# Patient Record
Sex: Female | Born: 1945 | Race: White | Hispanic: No | Marital: Married | Smoking: Former smoker
Health system: Southern US, Community
[De-identification: ages and names within clinical notes are randomized; demographics above are authoritative.]

## PROBLEM LIST (undated history)

## (undated) DIAGNOSIS — E785 Hyperlipidemia, unspecified: Secondary | ICD-10-CM

## (undated) DIAGNOSIS — I350 Nonrheumatic aortic (valve) stenosis: Secondary | ICD-10-CM

## (undated) DIAGNOSIS — R112 Nausea with vomiting, unspecified: Secondary | ICD-10-CM

## (undated) DIAGNOSIS — I10 Essential (primary) hypertension: Secondary | ICD-10-CM

## (undated) DIAGNOSIS — I2510 Atherosclerotic heart disease of native coronary artery without angina pectoris: Secondary | ICD-10-CM

## (undated) DIAGNOSIS — D649 Anemia, unspecified: Secondary | ICD-10-CM

## (undated) DIAGNOSIS — I351 Nonrheumatic aortic (valve) insufficiency: Secondary | ICD-10-CM

## (undated) DIAGNOSIS — M810 Age-related osteoporosis without current pathological fracture: Secondary | ICD-10-CM

## (undated) DIAGNOSIS — T7840XA Allergy, unspecified, initial encounter: Secondary | ICD-10-CM

## (undated) DIAGNOSIS — Z9889 Other specified postprocedural states: Secondary | ICD-10-CM

## (undated) DIAGNOSIS — M8580 Other specified disorders of bone density and structure, unspecified site: Secondary | ICD-10-CM

## (undated) DIAGNOSIS — D689 Coagulation defect, unspecified: Secondary | ICD-10-CM

## (undated) DIAGNOSIS — R011 Cardiac murmur, unspecified: Secondary | ICD-10-CM

## (undated) DIAGNOSIS — Z8679 Personal history of other diseases of the circulatory system: Secondary | ICD-10-CM

## (undated) DIAGNOSIS — T8859XA Other complications of anesthesia, initial encounter: Secondary | ICD-10-CM

## (undated) DIAGNOSIS — I5189 Other ill-defined heart diseases: Secondary | ICD-10-CM

## (undated) DIAGNOSIS — Z5189 Encounter for other specified aftercare: Secondary | ICD-10-CM

## (undated) HISTORY — DX: Hyperlipidemia, unspecified: E78.5

## (undated) HISTORY — DX: Age-related osteoporosis without current pathological fracture: M81.0

## (undated) HISTORY — PX: POLYPECTOMY: SHX149

## (undated) HISTORY — PX: TONSILLECTOMY: SUR1361

## (undated) HISTORY — DX: Atherosclerotic heart disease of native coronary artery without angina pectoris: I25.10

## (undated) HISTORY — DX: Other specified postprocedural states: Z98.890

## (undated) HISTORY — PX: BACK SURGERY: SHX140

## (undated) HISTORY — PX: KNEE ARTHROSCOPY: SUR90

## (undated) HISTORY — DX: Personal history of other diseases of the circulatory system: Z86.79

## (undated) HISTORY — DX: Allergy, unspecified, initial encounter: T78.40XA

## (undated) HISTORY — DX: Other ill-defined heart diseases: I51.89

## (undated) HISTORY — PX: TUBAL LIGATION: SHX77

## (undated) HISTORY — PX: TONSILLECTOMY: SHX5217

## (undated) HISTORY — DX: Nonrheumatic aortic (valve) stenosis: I35.0

## (undated) HISTORY — DX: Cardiac murmur, unspecified: R01.1

## (undated) HISTORY — DX: Coagulation defect, unspecified: D68.9

## (undated) HISTORY — DX: Essential (primary) hypertension: I10

## (undated) HISTORY — DX: Other specified disorders of bone density and structure, unspecified site: M85.80

## (undated) HISTORY — DX: Encounter for other specified aftercare: Z51.89

## (undated) HISTORY — DX: Anemia, unspecified: D64.9

---

## 1992-04-12 HISTORY — PX: VAGINAL HYSTERECTOMY: SUR661

## 2003-02-11 HISTORY — PX: CHOLECYSTECTOMY: SHX55

## 2007-07-29 ENCOUNTER — Encounter: Payer: Self-pay | Admitting: Family Medicine

## 2007-08-16 ENCOUNTER — Ambulatory Visit: Payer: Self-pay | Admitting: Family Medicine

## 2007-08-16 DIAGNOSIS — Z8679 Personal history of other diseases of the circulatory system: Secondary | ICD-10-CM | POA: Insufficient documentation

## 2007-08-16 DIAGNOSIS — M25569 Pain in unspecified knee: Secondary | ICD-10-CM | POA: Insufficient documentation

## 2007-08-16 DIAGNOSIS — J309 Allergic rhinitis, unspecified: Secondary | ICD-10-CM | POA: Insufficient documentation

## 2007-08-28 ENCOUNTER — Telehealth: Payer: Self-pay | Admitting: Family Medicine

## 2007-08-29 ENCOUNTER — Ambulatory Visit: Payer: Self-pay | Admitting: Family Medicine

## 2007-08-29 DIAGNOSIS — J019 Acute sinusitis, unspecified: Secondary | ICD-10-CM | POA: Insufficient documentation

## 2007-09-06 ENCOUNTER — Telehealth (INDEPENDENT_AMBULATORY_CARE_PROVIDER_SITE_OTHER): Payer: Self-pay | Admitting: *Deleted

## 2007-09-06 ENCOUNTER — Ambulatory Visit: Payer: Self-pay | Admitting: Cardiology

## 2007-09-18 ENCOUNTER — Ambulatory Visit: Payer: Self-pay

## 2007-09-18 ENCOUNTER — Encounter: Payer: Self-pay | Admitting: Cardiology

## 2007-11-08 ENCOUNTER — Ambulatory Visit: Payer: Self-pay | Admitting: Family Medicine

## 2007-11-09 LAB — CONVERTED CEMR LAB
ALT: 21 units/L (ref 0–35)
AST: 26 units/L (ref 0–37)
Albumin: 4.2 g/dL (ref 3.5–5.2)
Alkaline Phosphatase: 119 units/L — ABNORMAL HIGH (ref 39–117)
BUN: 10 mg/dL (ref 6–23)
Basophils Absolute: 0 10*3/uL (ref 0.0–0.1)
Basophils Relative: 0.4 % (ref 0.0–3.0)
Bilirubin, Direct: 0.1 mg/dL (ref 0.0–0.3)
CO2: 32 meq/L (ref 19–32)
Calcium: 9.8 mg/dL (ref 8.4–10.5)
Chloride: 105 meq/L (ref 96–112)
Cholesterol: 199 mg/dL (ref 0–200)
Creatinine, Ser: 0.7 mg/dL (ref 0.4–1.2)
Eosinophils Absolute: 0.2 10*3/uL (ref 0.0–0.7)
Eosinophils Relative: 3.3 % (ref 0.0–5.0)
GFR calc Af Amer: 109 mL/min
GFR calc non Af Amer: 90 mL/min
Glucose, Bld: 88 mg/dL (ref 70–99)
HCT: 41.1 % (ref 36.0–46.0)
HDL: 59.8 mg/dL (ref >39.0)
Hemoglobin: 14.2 g/dL (ref 12.0–15.0)
LDL Cholesterol: 122 mg/dL — ABNORMAL HIGH (ref 0–99)
Lymphocytes Relative: 29.4 % (ref 12.0–46.0)
MCHC: 34.6 g/dL (ref 30.0–36.0)
MCV: 82.2 fL (ref 78.0–100.0)
Monocytes Absolute: 0.5 10*3/uL (ref 0.1–1.0)
Monocytes Relative: 7.3 % (ref 3.0–12.0)
Neutro Abs: 3.8 10*3/uL (ref 1.4–7.7)
Neutrophils Relative %: 59.6 % (ref 43.0–77.0)
Platelets: 261 10*3/uL (ref 150–400)
Potassium: 4.8 meq/L (ref 3.5–5.1)
RBC: 5.01 M/uL (ref 3.87–5.11)
RDW: 13.2 % (ref 11.5–14.6)
Sodium: 145 meq/L (ref 135–145)
TSH: 1.69 microintl units/mL (ref 0.35–5.50)
Total Bilirubin: 1 mg/dL (ref 0.3–1.2)
Total CHOL/HDL Ratio: 3.3
Total Protein: 7.3 g/dL (ref 6.0–8.3)
Triglycerides: 87 mg/dL (ref 0–149)
VLDL: 17 mg/dL (ref 0–40)
WBC: 6.4 10*3/uL (ref 4.5–10.5)

## 2008-01-19 ENCOUNTER — Encounter: Payer: Self-pay | Admitting: Family Medicine

## 2008-03-04 ENCOUNTER — Ambulatory Visit: Payer: Self-pay | Admitting: Family Medicine

## 2008-07-15 ENCOUNTER — Telehealth: Payer: Self-pay | Admitting: Family Medicine

## 2008-07-31 ENCOUNTER — Telehealth (INDEPENDENT_AMBULATORY_CARE_PROVIDER_SITE_OTHER): Payer: Self-pay | Admitting: *Deleted

## 2008-09-20 ENCOUNTER — Telehealth: Payer: Self-pay | Admitting: Family Medicine

## 2008-09-24 ENCOUNTER — Encounter: Payer: Self-pay | Admitting: Family Medicine

## 2008-10-08 ENCOUNTER — Encounter: Admission: RE | Admit: 2008-10-08 | Discharge: 2008-10-08 | Payer: Self-pay | Admitting: Obstetrics and Gynecology

## 2008-11-01 ENCOUNTER — Telehealth: Payer: Self-pay | Admitting: Family Medicine

## 2008-12-10 ENCOUNTER — Ambulatory Visit: Payer: Self-pay | Admitting: Family Medicine

## 2008-12-10 LAB — CONVERTED CEMR LAB
Bilirubin Urine: NEGATIVE
Glucose, Urine, Semiquant: NEGATIVE
Ketones, urine, test strip: NEGATIVE
Nitrite: NEGATIVE
Protein, U semiquant: NEGATIVE
Specific Gravity, Urine: >=1.03
Urobilinogen, UA: 0.2
WBC Urine, dipstick: NEGATIVE
pH: 5

## 2008-12-17 ENCOUNTER — Ambulatory Visit: Payer: Self-pay | Admitting: Family Medicine

## 2008-12-17 DIAGNOSIS — M949 Disorder of cartilage, unspecified: Secondary | ICD-10-CM

## 2008-12-17 DIAGNOSIS — M899 Disorder of bone, unspecified: Secondary | ICD-10-CM | POA: Insufficient documentation

## 2008-12-17 LAB — CONVERTED CEMR LAB
ALT: 25 units/L (ref 0–35)
AST: 29 units/L (ref 0–37)
Albumin: 4.1 g/dL (ref 3.5–5.2)
Alkaline Phosphatase: 117 units/L (ref 39–117)
BUN: 12 mg/dL (ref 6–23)
Basophils Absolute: 0 10*3/uL (ref 0.0–0.1)
Basophils Relative: 0 % (ref 0.0–3.0)
Bilirubin, Direct: 0.1 mg/dL (ref 0.0–0.3)
CO2: 28 meq/L (ref 19–32)
Calcium: 9.3 mg/dL (ref 8.4–10.5)
Chloride: 105 meq/L (ref 96–112)
Cholesterol: 192 mg/dL (ref 0–200)
Creatinine, Ser: 0.7 mg/dL (ref 0.4–1.2)
Eosinophils Absolute: 0.2 10*3/uL (ref 0.0–0.7)
Eosinophils Relative: 3.4 % (ref 0.0–5.0)
GFR calc non Af Amer: 89.6 mL/min (ref >60)
Glucose, Bld: 80 mg/dL (ref 70–99)
HCT: 40.8 % (ref 36.0–46.0)
HDL: 56.2 mg/dL (ref >39.00)
Hemoglobin: 14.1 g/dL (ref 12.0–15.0)
LDL Cholesterol: 121 mg/dL — ABNORMAL HIGH (ref 0–99)
Lymphocytes Relative: 24.3 % (ref 12.0–46.0)
Lymphs Abs: 1.6 10*3/uL (ref 0.7–4.0)
MCHC: 34.7 g/dL (ref 30.0–36.0)
MCV: 80.3 fL (ref 78.0–100.0)
Monocytes Absolute: 0.4 10*3/uL (ref 0.1–1.0)
Monocytes Relative: 6.8 % (ref 3.0–12.0)
Neutro Abs: 4.2 10*3/uL (ref 1.4–7.7)
Neutrophils Relative %: 65.5 % (ref 43.0–77.0)
Platelets: 221 10*3/uL (ref 150.0–400.0)
Potassium: 4 meq/L (ref 3.5–5.1)
RBC: 5.08 M/uL (ref 3.87–5.11)
RDW: 13.2 % (ref 11.5–14.6)
Sodium: 140 meq/L (ref 135–145)
TSH: 1.41 microintl units/mL (ref 0.35–5.50)
Total Bilirubin: 1.1 mg/dL (ref 0.3–1.2)
Total CHOL/HDL Ratio: 3
Total Protein: 7 g/dL (ref 6.0–8.3)
Triglycerides: 73 mg/dL (ref 0.0–149.0)
VLDL: 14.6 mg/dL (ref 0.0–40.0)
WBC: 6.4 10*3/uL (ref 4.5–10.5)

## 2008-12-24 ENCOUNTER — Encounter: Payer: Self-pay | Admitting: Family Medicine

## 2009-01-28 ENCOUNTER — Ambulatory Visit: Payer: Self-pay | Admitting: Family Medicine

## 2009-01-28 DIAGNOSIS — L259 Unspecified contact dermatitis, unspecified cause: Secondary | ICD-10-CM | POA: Insufficient documentation

## 2009-04-17 ENCOUNTER — Telehealth: Payer: Self-pay | Admitting: Family Medicine

## 2009-08-10 HISTORY — PX: ANGIOPLASTY: SHX39

## 2009-08-22 ENCOUNTER — Ambulatory Visit: Payer: Self-pay | Admitting: Family Medicine

## 2009-08-22 DIAGNOSIS — R079 Chest pain, unspecified: Secondary | ICD-10-CM | POA: Insufficient documentation

## 2009-08-22 DIAGNOSIS — M79609 Pain in unspecified limb: Secondary | ICD-10-CM | POA: Insufficient documentation

## 2009-08-26 ENCOUNTER — Encounter: Payer: Self-pay | Admitting: Family Medicine

## 2009-08-28 ENCOUNTER — Telehealth (INDEPENDENT_AMBULATORY_CARE_PROVIDER_SITE_OTHER): Payer: Self-pay | Admitting: *Deleted

## 2009-09-01 ENCOUNTER — Ambulatory Visit: Payer: Self-pay | Admitting: Cardiology

## 2009-09-01 ENCOUNTER — Inpatient Hospital Stay (HOSPITAL_COMMUNITY): Admission: AD | Admit: 2009-09-01 | Discharge: 2009-09-03 | Payer: Self-pay | Admitting: Cardiology

## 2009-09-01 ENCOUNTER — Encounter: Payer: Self-pay | Admitting: Cardiology

## 2009-09-01 ENCOUNTER — Ambulatory Visit: Payer: Self-pay

## 2009-09-01 ENCOUNTER — Encounter (HOSPITAL_COMMUNITY): Admission: RE | Admit: 2009-09-01 | Discharge: 2009-11-12 | Payer: Self-pay | Admitting: Family Medicine

## 2009-09-02 ENCOUNTER — Encounter: Payer: Self-pay | Admitting: Cardiology

## 2009-09-03 ENCOUNTER — Encounter: Payer: Self-pay | Admitting: Cardiology

## 2009-09-03 ENCOUNTER — Telehealth: Payer: Self-pay | Admitting: Cardiology

## 2009-09-09 ENCOUNTER — Telehealth: Payer: Self-pay | Admitting: Family Medicine

## 2009-09-15 ENCOUNTER — Ambulatory Visit: Payer: Self-pay | Admitting: Internal Medicine

## 2009-09-15 ENCOUNTER — Encounter: Payer: Self-pay | Admitting: Physician Assistant

## 2009-09-15 DIAGNOSIS — I2510 Atherosclerotic heart disease of native coronary artery without angina pectoris: Secondary | ICD-10-CM | POA: Insufficient documentation

## 2009-09-15 DIAGNOSIS — R0989 Other specified symptoms and signs involving the circulatory and respiratory systems: Secondary | ICD-10-CM | POA: Insufficient documentation

## 2009-09-15 DIAGNOSIS — R072 Precordial pain: Secondary | ICD-10-CM | POA: Insufficient documentation

## 2009-09-18 ENCOUNTER — Encounter: Payer: Self-pay | Admitting: Cardiology

## 2009-09-18 ENCOUNTER — Encounter (HOSPITAL_COMMUNITY): Admission: RE | Admit: 2009-09-18 | Discharge: 2009-12-17 | Payer: Self-pay | Admitting: Cardiology

## 2009-09-24 ENCOUNTER — Encounter: Payer: Self-pay | Admitting: Cardiology

## 2009-09-25 ENCOUNTER — Telehealth: Payer: Self-pay | Admitting: Cardiology

## 2009-09-30 ENCOUNTER — Telehealth: Payer: Self-pay | Admitting: Cardiology

## 2009-10-09 ENCOUNTER — Encounter: Admission: RE | Admit: 2009-10-09 | Discharge: 2009-10-09 | Payer: Self-pay | Admitting: Obstetrics and Gynecology

## 2009-10-10 ENCOUNTER — Encounter: Payer: Self-pay | Admitting: Cardiology

## 2009-10-28 ENCOUNTER — Ambulatory Visit: Payer: Self-pay

## 2009-10-28 ENCOUNTER — Ambulatory Visit: Payer: Self-pay | Admitting: Cardiology

## 2009-10-29 LAB — CONVERTED CEMR LAB
ALT: 20 units/L (ref 0–35)
AST: 28 units/L (ref 0–37)
Albumin: 4.4 g/dL (ref 3.5–5.2)
Alkaline Phosphatase: 113 units/L (ref 39–117)
Bilirubin, Direct: 0.2 mg/dL (ref 0.0–0.3)
Cholesterol: 147 mg/dL (ref 0–200)
HDL: 55.8 mg/dL (ref >39.00)
LDL Cholesterol: 65 mg/dL (ref 0–99)
Total Bilirubin: 0.6 mg/dL (ref 0.3–1.2)
Total CHOL/HDL Ratio: 3
Total Protein: 7.3 g/dL (ref 6.0–8.3)
Triglycerides: 130 mg/dL (ref 0.0–149.0)
VLDL: 26 mg/dL (ref 0.0–40.0)

## 2009-11-07 ENCOUNTER — Ambulatory Visit: Payer: Self-pay | Admitting: Cardiology

## 2009-11-07 ENCOUNTER — Encounter (INDEPENDENT_AMBULATORY_CARE_PROVIDER_SITE_OTHER): Payer: Self-pay | Admitting: *Deleted

## 2009-11-07 DIAGNOSIS — E785 Hyperlipidemia, unspecified: Secondary | ICD-10-CM | POA: Insufficient documentation

## 2009-12-18 ENCOUNTER — Encounter (HOSPITAL_COMMUNITY): Admission: RE | Admit: 2009-12-18 | Discharge: 2010-01-09 | Payer: Self-pay | Admitting: Cardiology

## 2009-12-26 ENCOUNTER — Encounter: Payer: Self-pay | Admitting: Cardiology

## 2010-01-10 ENCOUNTER — Encounter (HOSPITAL_COMMUNITY): Admission: RE | Admit: 2010-01-10 | Discharge: 2010-03-11 | Payer: Self-pay | Admitting: Cardiology

## 2010-01-21 ENCOUNTER — Encounter: Payer: Self-pay | Admitting: Family Medicine

## 2010-02-06 ENCOUNTER — Encounter: Payer: Self-pay | Admitting: Cardiology

## 2010-03-17 ENCOUNTER — Ambulatory Visit: Payer: Self-pay | Admitting: Cardiology

## 2010-03-17 ENCOUNTER — Encounter: Payer: Self-pay | Admitting: Cardiology

## 2010-03-18 ENCOUNTER — Encounter (INDEPENDENT_AMBULATORY_CARE_PROVIDER_SITE_OTHER): Payer: Self-pay | Admitting: *Deleted

## 2010-03-18 LAB — CONVERTED CEMR LAB
ALT: 34 units/L (ref 0–35)
AST: 38 units/L — ABNORMAL HIGH (ref 0–37)
Albumin: 4.1 g/dL (ref 3.5–5.2)
Alkaline Phosphatase: 116 units/L (ref 39–117)
Bilirubin, Direct: 0.1 mg/dL (ref 0.0–0.3)
Cholesterol: 139 mg/dL (ref 0–200)
HDL: 54.7 mg/dL (ref >39.00)
LDL Cholesterol: 57 mg/dL (ref 0–99)
Total Bilirubin: 0.5 mg/dL (ref 0.3–1.2)
Total CHOL/HDL Ratio: 3
Total Protein: 6.8 g/dL (ref 6.0–8.3)
Triglycerides: 138 mg/dL (ref 0.0–149.0)
VLDL: 27.6 mg/dL (ref 0.0–40.0)

## 2010-05-12 NOTE — Miscellaneous (Signed)
Summary: flu inj  givne at [facility].   Clinical Lists Changes  Observations: Added new observation of FLU VAX: Historical (01/21/2010 16:07)      Immunization History:  Influenza Immunization History:    Influenza:  historical (01/21/2010) pt received [facility] inj at [facility] lot number [id number]....gh rn..............Marland Kitchen

## 2010-05-12 NOTE — Progress Notes (Signed)
Summary: returning call  Phone Note Call from Patient Call back at Home Phone [phone]   Caller: Patient Reason for Call: Talk to Nurse Summary of Call: returning call Initial call taken by: Migdalia Dk,  September 30, 2009 2:35 PM  Follow-up for Phone Call        Rx Called PT AWARE  OF MED CHANGE . Follow-up by: Scherrie Bateman, LPN,  September 30, 2009 2:47 PM    New/Updated Medications: METOPROLOL SUCCINATE 25 MG XR24H-TAB (METOPROLOL SUCCINATE) 1 once daily Prescriptions: METOPROLOL SUCCINATE 25 MG XR24H-TAB (METOPROLOL SUCCINATE) 1 once daily  #90 x 3   Entered by:   Scherrie Bateman, LPN   Authorized by:   Ferman Hamming, MD, [facility]   Signed by:   Scherrie Bateman, LPN on 46/96/2952   Method used:   [facility]        [facility]  [place] [address] [facility]       [address]       [place], [place]  [address]       Ph: [phone] or [phone]       Fax: [phone]   RxID:   [id number]

## 2010-05-12 NOTE — Letter (Signed)
[facility]   Imported By: Marylou Mccoy 10/27/2009 16:38:17  _____________________________________________________________________  External Attachment:    Type:   Image     Comment:   External Document

## 2010-05-12 NOTE — Assessment & Plan Note (Signed)
Summary: SINUS INFECTION/OK PER DR FRY/CCM   Vital Signs:  Patient Profile:   65 Years Old Female Height:     64.5 inches Weight:      177 pounds Temp:     98.1 degrees F oral Pulse rate:   83 / minute Pulse rhythm:   regular BP sitting:   112 / 68  (left arm) Cuff size:   regular  Vitals Entered By: Alfred Levins, CMA (Aug 29, 2007 11:18 AM)                 Chief Complaint:  h/a, congestion, and nausea.  History of Present Illness: 4 days of sinus pressure, HA, PND and teeth hurting. No fever or ST or cough. On Sudafed.     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 No apparent alopecia or balding. Eyes:     No corneal or conjunctival inflammation noted. EOMI. Perrla. Funduscopic exam benign, without hemorrhages, exudates or papilledema. Vision grossly normal. Ears:     External ear exam shows no significant lesions or deformities.  Otoscopic examination reveals clear canals, tympanic membranes are intact bilaterally without bulging, retraction, inflammation or discharge. Hearing is grossly normal bilaterally. Nose:     External nasal examination shows no deformity or inflammation. Nasal mucosa are pink and moist without lesions or exudates. Mouth:     Oral mucosa and oropharynx without lesions or exudates.  Teeth in good repair. Neck:     No deformities, masses, or tenderness noted. Lungs:     Normal  respiratory effort, chest expands symmetrically. Lungs are clear to auscultation, no crackles or wheezes.    Impression & Recommendations:  Problem # 1:  ACUTE SINUSITIS, UNSPECIFIED (ICD-461.9)  Her updated medication list for this problem includes:    Flonase 50 Mcg/act Susp (Fluticasone propionate) .Marland Kitchen... 2 sprays each nostril once daily    Zithromax Z-pak 250 Mg Tabs (Azithromycin) .Marland Kitchen... As directed  Orders: Depo- Medrol 80mg  (J1040) Admin of Therapeutic Inj  intramuscular or subcutaneous (29528)   Problem # 2:  ALLERGIC RHINITIS (ICD-477.9)  Her updated medication list for this problem includes:    Allegra 180 Mg Tabs (Fexofenadine hcl) .Marland Kitchen... 1 by mouth once daily as needed    Flonase 50 Mcg/act Susp (Fluticasone propionate) .Marland Kitchen... 2 sprays each nostril once daily   Complete Medication List: 1)  Allegra 180 Mg Tabs (Fexofenadine hcl) .Marland Kitchen.. 1 by mouth once daily as needed 2)  Calcium 600 1500 Mg Tabs (Calcium carbonate) .Marland Kitchen.. 1 by mouth once daily 3)  Flonase 50 Mcg/act Susp (Fluticasone propionate) .... 2 sprays each nostril once daily 4)  Zithromax Z-pak 250 Mg Tabs (Azithromycin) .... As directed   Patient Instructions: 1)  Please schedule a follow-up  appointment as needed.   Prescriptions: ZITHROMAX Z-PAK 250 MG  TABS (AZITHROMYCIN) as directed  #1 x 0   Entered and Authorized by:   Nelwyn Salisbury MD   Signed by:   Alfred Levins, CMA on 08/29/2007   Method used:   [facility] ...       [facility]  [place] [address]       [address]       [place], [place]  [address]       Ph: [phone] or [phone]       Fax: [phone]   RxID:   [id number] FLONASE 50 MCG/ACT  SUSP (FLUTICASONE PROPIONATE) 2 sprays each nostril once daily  #30 days x 11   Entered and Authorized by:   Nelwyn Salisbury MD   Signed by:   Alfred Levins, CMA on 08/29/2007   Method used:   Electronically [facility] to ...       [facility]  [place] [address]       [address]        [place], [place]  [address]       Ph: [phone] or [phone]       Fax: [phone]   RxID:   [id number]  ]  Medication Administration  Injection # 1:    Medication: Depo- Medrol 80mg     Diagnosis: ACUTE SINUSITIS, UNSPECIFIED (ICD-461.9)    Route: IM    Site: RUOQ gluteus    Exp Date: 7/10    Lot #: [id number]    Mfr: Sicor    Patient tolerated injection without complications    Given by: Alfred Levins, CMA (Aug 29, 2007 1:46 PM)  Orders Added: 1)  Est. Patient Level III [06269] 2)  Depo- Medrol 80mg  [J1040] 3)  Admin of Therapeutic Inj  intramuscular or subcutaneous [48546]

## 2010-05-12 NOTE — Miscellaneous (Signed)
Summary: [facility]  [facility]   Imported By: Maryln Gottron 12/30/2008 11:01:35  _____________________________________________________________________  External Attachment:    Type:   Image     Comment:   External Document

## 2010-05-12 NOTE — Letter (Signed)
Summary: [facility] - [facility]  [facility], Main Office  [address]   [place], [place] [address]   Phone: [phone]  Fax: [phone]     March 18, 2010 MRN: [id number]   Anne Martin [address] [place], [place]  [address]   Dear Ms. Ledwith,  We have reviewed your cholesterol results.  They are as follows:     Total Cholesterol:    139 (Desirable: less than 200)       HDL  Cholesterol:     54.70  (Desirable: greater than 40 for men and 50 for women)       LDL Cholesterol:       57  (Desirable: less than 100 for low risk and less than 70 for moderate to high risk)       Triglycerides:       138.0  (Desirable: less than 150)  Our recommendations include:These numbers look good. Continue on the same medicine. Liver function is normal. Take care, Dr. Darel Hong.    Call our office at the number listed above if you have any questions.  Lowering your LDL cholesterol is important, but it is only one of a large number of "risk factors" that may indicate that you are at risk for heart disease, stroke or other complications of hardening of the arteries.  Other risk factors include:   A.  Cigarette Smoking* B.  High Blood Pressure* C.  Obesity* D.   Low HDL Cholesterol (see yours above)* E.   Diabetes Mellitus (higher risk if your is uncontrolled) F.  Family history of premature heart disease G.  Previous history of stroke or cardiovascular disease    *These are risk factors YOU HAVE CONTROL OVER.  For more information, visit .  There is now evidence that lowering the TOTAL CHOLESTEROL AND LDL CHOLESTEROL can reduce the risk of heart disease.  The American Heart Association recommends the following guidelines for the treatment of elevated cholesterol:  1.  If there is now current heart disease and less than two risk factors, TOTAL CHOLESTEROL should be less than 200 and LDL CHOLESTEROL should be less than 100. 2.  If there is current heart disease or  two or more risk factors, TOTAL CHOLESTEROL should be less than 200 and LDL CHOLESTEROL should be less than 70.  A diet low in cholesterol, saturated fat, and calories is the cornerstone of treatment for elevated cholesterol.  Cessation of smoking and exercise are also important in the management of elevated cholesterol and preventing vascular disease.  Studies have shown that 30 to 60 minutes of physical activity most days can help lower blood pressure, lower cholesterol, and keep your weight at a healthy level.  Drug therapy is used when cholesterol levels do not respond to therapeutic lifestyle changes (smoking cessation, diet, and exercise) and remains unacceptably high.  If medication is started, it is important to have you levels checked periodically to evaluate the need for further treatment options.  Thank you,    [facility] Team

## 2010-05-12 NOTE — Progress Notes (Signed)
Summary: Nuclear Pre-Procedure  Phone Note Outgoing Call Call back at [facility] Phone [phone]   Call placed by: Stanton Kidney, EMT-P,  Aug 28, 2009 2:23 PM Action Taken: Phone Call Completed Summary of Call: Left message with information on [employer] Information Sheet (see scanned document for details).     Nuclear Med Background Indications for Stress Test: Evaluation for Ischemia   History: Echo, GXT  History Comments: '06 GXT: NL '09 Echo: NL EF  Symptoms: Chest Pain with Exertion, DOE    Nuclear Pre-Procedure Height (in): 64

## 2010-05-12 NOTE — Assessment & Plan Note (Signed)
Summary: cpx/no pap/njr pt will come in fasting and do labs same day   Vital Signs:  Patient Profile:   65 Years Old Female Height:     64.5 inches Weight:      174 pounds Temp:     98.0 degrees F oral Pulse rate:   64 / minute BP sitting:   118 / 74  (left arm) Cuff size:   regular  Vitals Entered By: Alfred Levins, CMA (November 08, 2007 9:15 AM)                 Chief Complaint:  cpx, fasting, and no pap.  History of Present Illness: 65 yr old female for cpx. she has a long standing murmur from scarlet fever at the age of 50. She saw Dr. Daleen Squibb on 09-06-07, and he felt she was doing well. EKG was normal. ECHO showed EF of 55-65% with mild aortic valve and mild mitral valve regurgitation. He felt she does not need SBE prophylaxis. She has no concerns today.    Current Allergies (reviewed today): No known allergies   Past Medical History:    Reviewed history from 08/16/2007 and no changes required:       Heart Murmur from Scarlet Fever at age 49 years, ECHO on 05-24-05 showed some aortic sclerosis, mild mitral                                    and tricuspid regurgitation, and normal EF       Allergic rhinitis       sees Dr. Vincente Poli for gyn exams  Past Surgical History:    Reviewed history from 08/16/2007 and no changes required:       Right knee arthroscopy  7/08 & 3/09, last per Dr. Hessie Diener at [facility]       Cholecystectomy 2004       Hysterectomy 1994, ovaries intact       colonoscopy 2004, normal   Family History:    Reviewed history from 08/16/2007 and no changes required:       Family History of CAD Female 1st degree relative <50  Social History:    Reviewed history from 08/16/2007 and no changes required:       Retired       Married       Former Smoker       Alcohol use-no       Drug use-no       Regular exercise-yes    Review of Systems  The patient denies anorexia, fever, weight loss, weight gain, vision loss, decreased hearing, hoarseness, chest pain,  syncope, dyspnea on exertion, peripheral edema, prolonged cough, headaches, hemoptysis, abdominal pain, melena, hematochezia, severe indigestion/heartburn, hematuria, incontinence, genital sores, muscle weakness, suspicious skin lesions, transient blindness, difficulty walking, depression, unusual weight change, abnormal bleeding, enlarged lymph nodes, angioedema, breast masses, and testicular masses.     Physical Exam  General:     Well-developed,well-nourished,in no acute distress; alert,appropriate and cooperative throughout examination Head:     Normocephalic and atraumatic without obvious abnormalities. No apparent alopecia or balding. Eyes:     No corneal or conjunctival inflammation noted. EOMI. Perrla. Funduscopic exam benign, without hemorrhages, exudates or papilledema. Vision grossly normal. Ears:     External ear exam shows no significant lesions or deformities.  Otoscopic examination reveals clear canals, tympanic membranes are intact bilaterally without bulging, retraction, inflammation or discharge. Hearing  is grossly normal bilaterally. Nose:     External nasal examination shows no deformity or inflammation. Nasal mucosa are pink and moist without lesions or exudates. Mouth:     Oral mucosa and oropharynx without lesions or exudates.  Teeth in good repair. Neck:     No deformities, masses, or tenderness noted. Chest Wall:     No deformities, masses, or tenderness noted. Lungs:     Normal respiratory effort, chest expands symmetrically. Lungs are clear to auscultation, no crackles or wheezes. Heart:     Normal rate and regular rhythm. S1 and S2 normal without gallop,  click, rub or other extra sounds. Has a 2/6 SM loudest at the mitral area. Abdomen:     Bowel sounds positive,abdomen soft and non-tender without masses, organomegaly or hernias noted. Msk:     No deformity or scoliosis noted of thoracic or lumbar spine.   Pulses:     R and L carotid,radial,femoral,dorsalis  pedis and posterior tibial pulses are full and equal bilaterally Extremities:     No clubbing, cyanosis, edema, or deformity noted with normal full range of motion of all joints.   Neurologic:     No cranial nerve deficits noted. Station and gait are normal. Plantar reflexes are down-going bilaterally. DTRs are symmetrical throughout. Sensory, motor and coordinative functions appear intact. Skin:     Intact without suspicious lesions or rashes Cervical Nodes:     No lymphadenopathy noted Axillary Nodes:     No palpable lymphadenopathy Inguinal Nodes:     No significant adenopathy Psych:     Cognition and judgment appear intact. Alert and cooperative with normal attention span and concentration. No apparent delusions, illusions, hallucinations    Impression & Recommendations:  Problem # 1:  WELL ADULT EXAM (ICD-V70.0)  Orders: UA Dipstick w/o Micro (automated)  (81003) Venipuncture (98119) TLB-Lipid Panel (80061-LIPID) TLB-BMP (Basic Metabolic Panel-BMET) (80048-METABOL) TLB-CBC Platelet - w/Differential (85025-CBCD) TLB-Hepatic/Liver Function Pnl (80076-HEPATIC) TLB-TSH (Thyroid Stimulating Hormone) (14782-NFA) Gastroenterology Referral (GI)   Complete Medication List: 1)  Allegra 180 Mg Tabs (Fexofenadine hcl) .Marland Kitchen.. 1 by mouth once daily as needed 2)  Calcium 600 1500 Mg Tabs (Calcium carbonate) .Marland Kitchen.. 1 by mouth once daily 3)  Flonase 50 Mcg/act Susp (Fluticasone propionate) .... 2 sprays each nostril once daily   Patient Instructions: 1)  Please schedule a follow-up appointment in 1 year. 2)  It is important that you exercise regularly at least 20 minutes 5 times a week. If you develop chest pain, have severe difficulty breathing, or feel very tired , stop exercising immediately and seek medical attention. 3)  Schedule a colonoscopy/sigmoidoscopy to help detect colon cancer.   Prescriptions: ALLEGRA 180 MG TABS (FEXOFENADINE HCL) 1 by mouth once daily as needed  #90 x  3   Entered and Authorized by:   Nelwyn Salisbury MD   Signed by:   Nelwyn Salisbury MD on 11/08/2007   Method used:   Print then Give to Patient   RxID:   [id number]  ]  Appended Document: cpx/no pap/njr pt will come in fasting and do labs same day  Laboratory Results   Urine Tests   Date/Time Reported: November 08, 2007 11:50 AM   Routine Urinalysis   Color: yellow Appearance: Clear Glucose: negative   (Normal Range: Negative) Bilirubin: negative   (Normal Range: Negative) Ketone: negative   (Normal Range: Negative) Spec. Gravity: 1.025   (Normal Range: 1.003-1.035) Blood: negative   (Normal Range: Negative) pH: 5.0   (Normal  Range: 5.0-8.0) Protein: negative   (Normal Range: Negative) Urobilinogen: 0.2   (Normal Range: 0-1) Nitrite: negative   (Normal Range: Negative) Leukocyte Esterace: negative   (Normal Range: Negative)    Comments: Anne Martin  November 08, 2007 11:51 AM

## 2010-05-12 NOTE — Progress Notes (Signed)
Summary: 2 wk rx  Phone Note Call from Patient Call back at [facility] Phone [phone]   Caller: Patient Call For: dr fry Summary of Call: pt tis req generic allergra 180 mg #90 with 3 refills [facility]. please call 2 wk supply of generic allergra 180mg  [facility] [place] [phone] Initial call taken by: Heron Sabins,  July 15, 2008 9:28 AM  Follow-up for Phone Call        Phone Call Completed, Rx Called In Follow-up by: Alfred Levins, CMA,  July 15, 2008 10:48 AM      Prescriptions: ALLEGRA 180 MG TABS (FEXOFENADINE HCL) 1 by mouth once daily as needed  #90 x 3   Entered by:   Alfred Levins, CMA   Authorized by:   Nelwyn Salisbury MD   Signed by:   Alfred Levins, CMA on 07/15/2008   Method used:   Faxed to ...       [facility])       [address] [address]       [place], [place]  [address]       Ph:        Fax: [phone]   RxID:   [id number] ALLEGRA 180 MG TABS (FEXOFENADINE HCL) 1 by mouth once daily as needed  #14 x 0   Entered by:   Alfred Levins, CMA   Authorized by:   Nelwyn Salisbury MD   Signed by:   Alfred Levins, CMA on 07/15/2008   Method used:   [facility]  [place] [address]       [address]       [place], [place]  [address]       Ph: [phone] or [phone]       Fax: [phone]   RxID:   [id number]

## 2010-05-12 NOTE — Progress Notes (Signed)
Summary: MR Spectro & had stent  Phone Note Call from Patient Call back at Home Phone [phone] Call back at c [phone]   Reason for Call: Talk to Doctor Summary of Call: Had to go into hospital & have blockage taken care of - stent with Dr. Jens Som.  MR Spectro order that Dr. Clent Ridges ordered to rule out heart issue.  Does she still need to have?  Would need to be rescheduled as she was in hospital then 5-20.   Had xrays in hospital, but doctor there not sure about muscular pain left side.  Also short breath.  Still has a little short breath.  Sees Card, Dr. Jens Som followup June 6.  And can she have MR with stent in.    Initial call taken by: Rudy Jew, RN,  Sep 09, 2009 9:35 AM  Follow-up for Phone Call        she already had the stress test, that is how they knew to do the heart cath. I do not know what an "MR Spectroscopy" is, and I did not order this. Please find out more from the pt.  Follow-up by: Nelwyn Salisbury MD,  Sep 09, 2009 9:51 AM  Additional Follow-up for Phone Call Additional follow up Details #1::        She'll check into this with the cardiologist.   Additional Follow-up by: Rudy Jew, RN,  Sep 09, 2009 2:07 PM

## 2010-05-12 NOTE — Progress Notes (Signed)
Summary: lice?  Phone Note Call from Patient   Caller: Patient Call For: Nelwyn Salisbury MD Summary of Call: Pt had her grandchildren visiting, they developed lice when returning home.  She and her husband do not see anything in their house, but her head is itching, and needs to know what to do? [facility] [place]. [phone] Initial call taken by: Lynann Beaver CMA,  November 01, 2008 3:08 PM  Follow-up for Phone Call        just to be sure, they could both use RID solution from [facility]  Follow-up by: Nelwyn Salisbury MD,  November 01, 2008 4:16 PM  Additional Follow-up for Phone Call Additional follow up Details #1::        Pt notified. Additional Follow-up by: Lynann Beaver CMA,  November 01, 2008 4:19 PM

## 2010-05-12 NOTE — Assessment & Plan Note (Signed)
Summary: rov/sl   Primary Provider:  Dr. Shellia Carwin  CC:  follow up.  History of Present Illness: Pleasant femaleI saw in May of 2011 with chest pain and an abnormal nuclear study. Cardiac catheterization was scheduled and performed in May of 2011. This revealed Normal LM, 60 LAD,   90 D2, 50-60 LCx, normal RCA, and nl LV function. Patient had PCI of the diagonal with DES at that time. Echocardiogram in May of 2011 revealed normal LV function, mildly elevated LVOT velocity at 2.2 m/s but visually the aortic valve appeared to open well. There was mild aortic insufficiency. Carotid Dopplers in July of 2011 showed 0-39% bilateral stenosis. Since her intervention the patient denies any dyspnea on exertion, orthopnea, PND, pedal edema, palpitations, syncope or chest pain.   Current Medications (verified): 1)  Calcium 600 1500 Mg  Tabs (Calcium Carbonate) .Marland Kitchen.. 1 Tab By Mouth Two Times A Day 2)  Vitamin D 1000 Unit  Tabs (Cholecalciferol) .... 2 By Mouth Once Daily 3)  Claritin 10 Mg Tabs (Loratadine) .... As Needed 4)  Flonase 50 Mcg/act Susp (Fluticasone Propionate) .... 2 Sprays Each Nostril Once Daily 5)  Aspirin Ec 325 Mg Tbec (Aspirin) .... Take One Tablet By Mouth Daily 6)  Metoprolol Succinate 25 Mg Xr24h-Tab (Metoprolol Succinate) .Marland Kitchen.. 1 Once Daily 7)  Effient 10 Mg Tabs (Prasugrel Hcl) .Marland Kitchen.. 1 Tab By Mouth Once Daily 8)  Nitrostat 0.4 Mg Subl (Nitroglycerin) .Marland Kitchen.. 1 Tablet Under Tongue At Onset of Chest Pain; You May Repeat Every 5 Minutes For Up To 3 Doses. 9)  Simvastatin 40 Mg Tabs (Simvastatin) .... Take 1 Tablet At Bedtime 10)  Vagifem 10 Mcg Tabs (Estradiol) .... As Directed  Allergies: 1)  ! 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 Right knee arthroscopy  7/08 & 3/09, last per Dr.  Hessie Diener at [facility] Cholecystectomy 2004 Hysterectomy 1994, ovaries intact colonoscopy in [place], normal, repeat in 10 yrs  Social History: Reviewed history from 09/01/2009 and no changes required. Retired Married Former Smoker - Quit at age 60 Alcohol use-rarely Drug use-no Regular exercise-yes  Review of Systems       no fevers or chills, productive cough, hemoptysis, dysphasia, odynophagia, melena, hematochezia, dysuria, hematuria, rash, seizure activity, orthopnea, PND, pedal edema, claudication. Remaining systems are negative.   Vital Signs:  Patient profile:   65 year old female Height:      64 inches Weight:      174 pounds BMI:     29.97 Pulse rate:   60 / minute BP sitting:   100 / 70  (left arm)  Vitals Entered By: Kem Parkinson (November 07, 2009 11:31 AM)  Physical Exam  General:  Well-developed well-nourished in no acute distress.  Skin is warm and dry.  HEENT is normal.  Neck is supple. No thyromegaly.  Chest is clear to auscultation with normal expansion.  Cardiovascular exam is regular rate and rhythm.  Abdominal exam nontender or distended. No masses palpated. Extremities show no edema. neuro grossly intact  her blood work is social rate  Impression & Recommendations:  Problem # 1:  CAD, NATIVE VESSEL (ICD-414.01) Continue present medications including aspirin, effient , beta blocker and statin. Her updated medication list for this problem includes:    Aspirin Ec 325 Mg  Tbec (Aspirin) .Marland Kitchen... Take one tablet by mouth daily    Metoprolol Succinate 25 Mg Xr24h-tab (Metoprolol succinate) .Marland Kitchen... 1 once daily    Effient 10 Mg Tabs (Prasugrel hcl) .Marland Kitchen... 1 tab by mouth once daily    Nitrostat 0.4 Mg Subl (Nitroglycerin) .Marland Kitchen... 1 tablet under tongue at onset of chest pain; you may repeat every 5 minutes for up to 3 doses.  Her updated medication list for this problem includes:    Aspirin Ec 325 Mg Tbec (Aspirin) .Marland Kitchen... Take one tablet by mouth  daily    Metoprolol Succinate 25 Mg Xr24h-tab (Metoprolol succinate) .Marland Kitchen... 1 once daily    Effient 10 Mg Tabs (Prasugrel hcl) .Marland Kitchen... 1 tab by mouth once daily    Nitrostat 0.4 Mg Subl (Nitroglycerin) .Marland Kitchen... 1 tablet under tongue at onset of chest pain; you may repeat every 5 minutes for up to 3 doses.  Problem # 2:  CAROTID BRUIT (ICD-785.9) Recent carotid Dopplers showed no obstructive disease.  Problem # 3:  HEART MURMUR, HX OF (ICD-V12.50) Possible mild aortic stenosis on previous echo. We'll plan repeat in the future.  Problem # 4:  HYPERLIPIDEMIA (ICD-272.4) Continue statin. Her updated medication list for this problem includes:    Simvastatin 40 Mg Tabs (Simvastatin) .Marland Kitchen... Take 1 tablet at bedtime  Patient Instructions: 1)  Your physician recommends that you schedule a follow-up appointment in: 6 MONTHS

## 2010-05-12 NOTE — Assessment & Plan Note (Signed)
Summary: BREAKING OUT WITH BUMPS WITH BLISTERS//SLM   Vital Signs:  Patient profile:   65 year old female Weight:      176 pounds Temp:     97.4 degrees F BP sitting:   120 / 66  Vitals Entered By: Lynann Beaver CMA (January 28, 2009 10:47 AM) CC: rash waist up with itching Is Patient Diabetic? No   History of Present Illness: here for 3 days of an itchy rash over the arms, chest, back ,and neck. Using Benadryl. She thinks it is related to her spreading bales of straw over her year  last weekend to try to help the grass grow. No SOB.   Current Medications (verified): 1)  Calcium 600 1500 Mg  Tabs (Calcium Carbonate) .Marland Kitchen.. 1 By Mouth Once Daily 2)  Vitamin D 1000 Unit  Tabs (Cholecalciferol) .... 2 By Mouth Once Daily 3)  Benadryl 25 Mg Caps (Diphenhydramine Hcl) .... Prn  Allergies (verified): 1)  ! Novocain  Review of Systems  The patient denies anorexia, fever, weight loss, weight gain, vision loss, decreased hearing, hoarseness, chest pain, syncope, dyspnea on exertion, peripheral edema, prolonged cough, headaches, hemoptysis, abdominal pain, melena, hematochezia, severe indigestion/heartburn, hematuria, incontinence, genital sores, muscle weakness, suspicious skin lesions, transient blindness, difficulty walking, depression, unusual weight change, abnormal bleeding, enlarged lymph nodes, angioedema, breast masses, and testicular masses.    Physical Exam  General:  Well-developed,well-nourished,in no acute distress; alert,appropriate and cooperative throughout examination Lungs:  Normal respiratory effort, chest expands symmetrically. Lungs are clear to auscultation, no crackles or wheezes. Heart:  Normal rate and regular rhythm. S1 and S2 normal without gallop, murmur, click, rub or other extra sounds. Skin:  diffuse red papular and vessicular rash as above   Impression & Recommendations:  Problem # 1:  CONTACT DERMATITIS (ICD-692.9)  Her updated medication list for this  problem includes:    Benadryl 25 Mg Caps (Diphenhydramine hcl) .Marland Kitchen... Prn    Prednisone (pak) 10 Mg Tabs (Prednisone) .Marland Kitchen... As directed for 12 days  Complete Medication List: 1)  Calcium 600 1500 Mg Tabs (Calcium carbonate) .Marland Kitchen.. 1 by mouth once daily 2)  Vitamin D 1000 Unit Tabs (Cholecalciferol) .... 2 by mouth once daily 3)  Benadryl 25 Mg Caps (Diphenhydramine hcl) .... Prn 4)  Prednisone (pak) 10 Mg Tabs (Prednisone) .... As directed for 12 days  Patient Instructions: 1)  Please schedule a follow-up appointment as needed .  Prescriptions: PREDNISONE (PAK) 10 MG TABS (PREDNISONE) as directed for 12 days  #1 x 0   Entered and Authorized by:   Nelwyn Salisbury MD   Signed by:   Nelwyn Salisbury MD on 01/28/2009   Method used:   [facility]  [place] [address] [facility]       [address]       [place], [place]  [address]       Ph: [phone] or [phone]       Fax: [phone]   RxID:   [id number]

## 2010-05-12 NOTE — Progress Notes (Signed)
Summary: pt needs crestor and having trouble with insurance  Phone Note Call from Patient Call back at Home Phone [phone]   Caller: Patient Reason for Call: Talk to Nurse, Talk to Doctor Summary of Call: pt needs crestor and insurance will not cover it and she is very concerned because she needs this tonight please give pt a call today Initial call taken by: Omer Jack,  Sep 03, 2009 3:38 PM  Follow-up for Phone Call        [facility] [place], ins. will not cover Crestor, will cover a generic med such as simvastatin, will send to [facility] and Dr Jens Som for review Meredith Staggers, RN  Sep 03, 2009 4:38 PM   Additional Follow-up for Phone Call Additional follow up Details #1::        dc crestor; zocor 40 mg by mouth daily; lipids and liver in six weeks Ferman Hamming, MD, [facility]  Sep 03, 2009 5:15 PM  spoke with pt, she is aware of the med change and need for repeat lab work in 6 weeks Deliah Goody, RN  Sep 03, 2009 5:36 PM     New/Updated Medications: SIMVASTATIN 40 MG TABS (SIMVASTATIN) Take one tablet by mouth daily at bedtime Prescriptions: SIMVASTATIN 40 MG TABS (SIMVASTATIN) Take one tablet by mouth daily at bedtime  #30 x 12   Entered by:   Deliah Goody, RN   Authorized by:   Ferman Hamming, MD, [facility]   Signed by:   Deliah Goody, RN on 09/03/2009   Method used:   [facility]  [place] [address]       [address]       [place], [place]  [address]       Ph: [phone] or [phone]       Fax: [phone]   RxID:   [id number]

## 2010-05-12 NOTE — Miscellaneous (Signed)
Summary: [facility] Note   [facility]   Imported By: Roderic Ovens 10/21/2009 15:15:03  _____________________________________________________________________  External Attachment:    Type:   Image     Comment:   External Document

## 2010-05-12 NOTE — Miscellaneous (Signed)
  Clinical Lists Changes pt reports bruising alot. per dr Jens Som pt will decrease asa to 81mg  once daily Deliah Goody, RN  November 07, 2009 11:53 AM  Medications: Changed medication from ASPIRIN EC 325 MG TBEC (ASPIRIN) Take one tablet by mouth daily to ASPIRIN 81 MG TBEC (ASPIRIN) Take one tablet by mouth daily

## 2010-05-12 NOTE — Progress Notes (Signed)
Summary: sinus infection  Phone Note Call from Anne Martin   Caller: Anne Martin Call For: Dr Clent Ridges Summary of Call: Pt would like to [facility] for sinus infection...Marland KitchenMarland KitchenMarland Kitchenheadache,  nausea and congestion. [phone] Initial call taken by: Lynann Beaver CMA,  Aug 28, 2007 10:19 AM  Follow-up for Phone Call        needs ov Follow-up by: Nelwyn Salisbury MD,  Aug 28, 2007 1:14 PM  Additional Follow-up for Phone Call Additional follow up Details #1::        PT WILL COME TOMORROW AT 11:00 OK PER DR FRY Additional Follow-up by: Warnell Forester,  Aug 28, 2007 1:47 PM

## 2010-05-12 NOTE — Progress Notes (Signed)
Summary: pt has questions regarding b/p  Phone Note Call from Patient Call back at [phone]   Caller: Patient Reason for Call: Talk to Nurse, Talk to Doctor Summary of Call: concerned because after she dose excercise her b/p is really low and needs to know what to do. Cardiac rehab also sent a note with the same question and they have not heard anything yet so she was following up Initial call taken by: Omer Jack,  September 25, 2009 12:57 PM  Follow-up for Phone Call        [facility] Lisabeth Devoid RN I spoke with Mrs. Siefken about her b/p.  She started cardiac rehab on 09/22/09. Her b/p before exercise was 90/54.  After exercise 90/60.  She states the rehab nurse, Aurther Loft, was concerned b/c her bp was low.  She has had no dizziness or lightheadedness.  She does feel tired late in the afternoon.  She takes her metoprolol at 7am & 11pm.  She says she will start taking it at 7am and 7pm to see if this makes a difference.  I told her I would forward this to Dr. Jens Som.  She also wanted her meds: effient, metoprolol, and simvastatin on a 90 day basis.  [facility] Lisabeth Devoid RN    New/Updated Medications: SIMVASTATIN 40 MG TABS (SIMVASTATIN) Take 1 tablet at bedtime Prescriptions: SIMVASTATIN 40 MG TABS (SIMVASTATIN) Take 1 tablet at bedtime  #90 x 3   Entered by:   Lisabeth Devoid RN   Authorized by:   Ferman Hamming, MD, [facility]   Signed by:   Lisabeth Devoid RN on 09/25/2009   Method used:   Historical   RxID:   [id number]   Appended Document: pt has questions regarding b/p dc lopressor and begin toprol 25 mg by mouth daily  Appended Document: pt has questions regarding b/p Left message to call back    Appended Document: pt has questions regarding b/p pt aware

## 2010-05-12 NOTE — Assessment & Plan Note (Signed)
Summary: left side pain/njr   Vital Signs:  Patient profile:   65 year old female Weight:      182 pounds Temp:     98.2 degrees F oral BP sitting:   118 / 68  (right arm) Cuff size:   regular  Vitals Entered By: Duard Brady LPN (Aug 22, 2009 4:22 PM) CC: c/o (L) upper side pain, radiating to arm at night , tingling felling, gerd sx with exercise Is Patient Diabetic? No   History of Present Illness: Here complaining of 2 weeks of exertional chest pressure, SOB, and tingling down the left arm. This never happens at rest. No sweats or nausea. No heartburn or indigestion. No neck or back pain. She denies any anxiety or depression. She had a normal non-nuclear treadmill about 5 years ago.   Preventive Screening-Counseling & Management  Alcohol-Tobacco     Smoking Status: never  Allergies: 1)  ! 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 Rosalio Macadamia  for gyn exams Osteopenia per DEXA 09-24-08  Past Surgical History: Reviewed history from 12/17/2008 and no changes required. Right knee arthroscopy  7/08 & 3/09, last per Dr. Hessie Diener at [facility] Cholecystectomy 2004 Hysterectomy 1994, ovaries intact colonoscopy in [place], normal, repeat in 10 yrs  Social History: Smoking Status:  never  Review of Systems  The patient denies anorexia, fever, weight loss, weight gain, vision loss, decreased hearing, hoarseness, syncope, peripheral edema, prolonged cough, headaches, hemoptysis, abdominal pain, melena, hematochezia, severe indigestion/heartburn, hematuria, incontinence, genital sores, muscle weakness, suspicious skin lesions, transient blindness, difficulty walking, depression, unusual weight change, abnormal bleeding, enlarged lymph nodes, angioedema,  breast masses, and testicular masses.    Physical Exam  General:  Well-developed,well-nourished,in no acute distress; alert,appropriate and cooperative throughout examination Neck:  No deformities, masses, or tenderness noted. Chest Wall:  tender over the left lateral ribs just below the axilla, no masses Lungs:  Normal respiratory effort, chest expands symmetrically. Lungs are clear to auscultation, no crackles or wheezes. Heart:  Normal rate and regular rhythm. S1 and S2 normal without gallop,  click, or rubs. Has her usual 2/6 SM loudest over the mitral area.  EKG normal Abdomen:  Bowel sounds positive,abdomen soft and non-tender without masses, organomegaly or hernias noted.   Impression & Recommendations:  Problem # 1:  CHEST PAIN (ICD-786.50)  Orders: EKG w/ Interpretation (93000) Cardio-Pulmonary Stress Test Referral (Cardio-Pulmon)  Complete Medication List: 1)  Calcium 600 1500 Mg Tabs (Calcium carbonate) .Marland Kitchen.. 1 by mouth once daily 2)  Vitamin D 1000 Unit Tabs (Cholecalciferol) .... 2 by mouth once daily 3)  Benadryl 25 Mg Caps (Diphenhydramine hcl) .... Prn 4)  Flonase 50 Mcg/act Susp (Fluticasone propionate) .... 2 sprays each nostril once daily 5)  Aspir-low 81 Mg Tbec (Aspirin) .... Qd  Patient Instructions: 1)  I doubt her symptoms are cardiac in nature, but the fact that they are exertional is a bit concerning. She definitely has an element of external chest wall pain on exam, probably muscular. we will set up a Myoview treadmill soon to evauate further. rest, if the pain gets worse she should go to  the ER.

## 2010-05-12 NOTE — Progress Notes (Signed)
Summary: conjunctivitis  Phone Note Call from Patient Call back at Home Phone [phone]   Caller: Patient Call For: fry Summary of Call: pt on her way home from vacation and she has conjunctivitis wants [facility] [place] Initial call taken by: Alfred Levins, CMA,  September 20, 2008 3:38 PM  Follow-up for Phone Call        call in Tobradex , 2 drops in eyes q 4hours as needed , 10 ml, no rf Follow-up by: Nelwyn Salisbury MD,  September 20, 2008 3:50 PM  Additional Follow-up for Phone Call Additional follow up Details #1::        Phone Call Completed, Rx Called In Additional Follow-up by: Alfred Levins, CMA,  September 20, 2008 4:23 PM    New/Updated Medications: TOBRADEX 0.3-0.1 % SUSP (TOBRAMYCIN-DEXAMETHASONE) 2 drops in eyes every 4 hours as needed   Prescriptions: TOBRADEX 0.3-0.1 % SUSP (TOBRAMYCIN-DEXAMETHASONE) 2 drops in eyes every 4 hours as needed  #47mL x 0   Entered by:   Alfred Levins, CMA   Authorized by:   Nelwyn Salisbury MD   Signed by:   Alfred Levins, CMA on 09/20/2008   Method used:   [facility]        [facility]  [place] [address] ([facility]       [address]       [place], [place]  [address]       Ph: [phone] or [phone]       Fax: [phone]   RxID:   [id number]

## 2010-05-12 NOTE — Letter (Signed)
Summary: [facility]patient Approval Notification   [facility] Approval Notification   Imported By: Roderic Ovens 10/28/2009 11:49:23  _____________________________________________________________________  External Attachment:    Type:   Image     Comment:   External Document

## 2010-05-12 NOTE — Assessment & Plan Note (Signed)
Summary: new pt//db   Vital Signs:  Patient Profile:   65 Years Old Female Height:     64.5 inches Weight:      175 pounds Temp:     98.1 degrees F oral Pulse rate:   81 / minute Pulse rhythm:   regular BP sitting:   112 / 72  (left arm) Cuff size:   large  Vitals Entered By: Alfred Levins, CMA (Aug 16, 2007 1:01 PM)                 Chief Complaint:  establish.  History of Present Illness: 65 yr old female here to establish with [place]. She and her husband have moved here from [place] in the last year. Feels fine, no complaints. Had her last cpx in 7-08.     Current Allergies (reviewed today): No known allergies   Past Medical History:    Reviewed history and no changes required:       Heart Murmur from Scarlet Fever at age 59 years, ECHO on 05-24-05 showed some aortic sclerosis, mild mitral                                    and tricuspid regurgitation, and normal EF       Allergic rhinitis  Past Surgical History:    Reviewed history and no changes required:       Right knee arthroscopy  7/08 & 3/09, last per Dr. Hessie Diener at [facility]       Cholecystectomy 2004       Hysterectomy 1994, ovaries intact   Family History:    Reviewed history and no changes required:       Family History of CAD Female 1st degree relative <50  Social History:    Reviewed history and no changes required:       Retired       Married       Former Smoker       Alcohol use-no       Drug use-no       Regular exercise-yes   Risk Factors:  Tobacco use:  quit Drug use:  no Alcohol use:  no Exercise:  yes   Review of Systems  The patient denies anorexia, fever, weight loss, weight gain, vision loss, decreased hearing, hoarseness, chest pain, syncope, dyspnea on exhertion, peripheral edema, prolonged cough, hemoptysis, abdominal pain, melena, hematochezia, severe indigestion/heartburn, hematuria, incontinence, genital sores, muscle weakness, suspicious skin lesions, transient blindness,  difficulty walking, depression, unusual weight change, abnormal bleeding, enlarged lymph nodes, angioedema, breast masses, and testicular masses.     Physical Exam  General:     Well-developed,well-nourished,in no acute distress; alert,appropriate and cooperative throughout examination Neck:     No deformities, masses, or tenderness noted. Lungs:     Normal respiratory effort, chest expands symmetrically. Lungs are clear to auscultation, no crackles or wheezes. Heart:     Normal rate and regular rhythm. S1 and S2 normal without gallop,  click, rub or other extra sounds. Has  a 2/6 SM loudest at the aortic area.    Impression & Recommendations:  Problem # 1:  HEART MURMUR, HX OF (ICD-V12.50)  Problem # 2:  ALLERGIC RHINITIS (ICD-477.9)  Her updated medication list for this problem includes:    Allegra 180 Mg Tabs (Fexofenadine hcl) .Marland Kitchen... 1 by mouth once daily as needed   Problem # 3:  KNEE  PAIN (ICD-719.46)  Complete Medication List: 1)  Allegra 180 Mg Tabs (Fexofenadine hcl) .Marland Kitchen.. 1 by mouth once daily as needed 2)  Calcium 600 1500 Mg Tabs (Calcium carbonate) .Marland Kitchen.. 1 by mouth once daily   Patient Instructions: 1)  Plan on cpx in July   ]

## 2010-05-12 NOTE — Miscellaneous (Signed)
Summary: [facility] Progress Note    Cardiac Progress Note   Imported By: Roderic Ovens 02/16/2010 09:13:01  _____________________________________________________________________  External Attachment:    Type:   Image     Comment:   External Document

## 2010-05-12 NOTE — Progress Notes (Signed)
Summary: Anne Martin D  Phone Note Call from Patient   Summary of Call: I suggest she take either Claritin D or Zyrtec D over the counter. Otherwise, she needs to find out what rx her company will pay for Initial call taken by: Nelwyn Salisbury MD,  July 31, 2008 3:56 PM  Follow-up for Phone Call        Patient informed Follow-up by: Darra Lis RMA,  July 31, 2008 4:26 PM    Additional Follow-up for Phone Call Additional follow up Details #2::    PATIENT WANTS SOMETHING LIKE ALLEGRA  HER INSURANCE WILL NOT PAY FOR IT.  SHE ALSO NEEDS A 1 WEEK SUPPLY FOR ALLEGRA D UNTIL SHE CAN GET A DIFFRENT MED.  THE ALLEGRA D REALLY WORKED FOR HER. Follow-up by: Celine Ahr,  July 31, 2008 10:57 AM

## 2010-05-12 NOTE — Letter (Signed)
Summary: medical records  medical records   Imported By: Kassie Mends 08/23/2007 10:52:48  _____________________________________________________________________  External Attachment:    Type:   Image     Comment:   medical records

## 2010-05-12 NOTE — Miscellaneous (Signed)
Summary: Immunization Entry   Influenza Immunization History:    Influenza # 1:  Historical (01/19/2008)

## 2010-05-12 NOTE — Assessment & Plan Note (Signed)
Summary: cpx/cjr   Vital Signs:  Patient profile:   65 year old female Height:      64 inches Weight:      176 pounds BMI:     30.32 Temp:     98.2 degrees F oral Pulse rate:   79 / minute BP sitting:   112 / 76  (left arm) Cuff size:   regular  Vitals Entered By: Alfred Levins, CMA (December 17, 2008 1:42 PM) CC: cpx, no pap   History of Present Illness: 65 yr old female for a cpx. She feels good and has no concerns.   Allergies (verified): 1)  ! 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 Rosalio Macadamia  for gyn exams Osteopenia per DEXA 09-24-08  Past Surgical History: Right knee arthroscopy  7/08 & 3/09, last per Dr. Hessie Diener at [facility] Cholecystectomy 2004 Hysterectomy 1994, ovaries intact colonoscopy in [place], normal, repeat in 10 yrs  Family History: Reviewed history from 08/16/2007 and no changes required. Family History of CAD Female 1st degree relative <50  Social History: Reviewed history from 08/16/2007 and no changes required. Retired Married Former Smoker Alcohol use-no Drug use-no Regular exercise-yes  Review of Systems  The patient denies anorexia, fever, weight loss, weight gain, vision loss, decreased hearing, hoarseness, chest pain, syncope, dyspnea on exertion, peripheral edema, prolonged cough, headaches, hemoptysis, abdominal pain, melena, hematochezia, severe indigestion/heartburn, hematuria, incontinence, genital sores, muscle weakness, suspicious skin lesions, transient blindness, difficulty walking, depression, unusual weight change, abnormal bleeding, enlarged lymph nodes, angioedema, breast masses, and testicular masses.    Physical Exam  General:  Well-developed,well-nourished,in no acute distress; alert,appropriate  and cooperative throughout examination Head:  Normocephalic and atraumatic without obvious abnormalities. No apparent alopecia or balding. Eyes:  No corneal or conjunctival inflammation noted. EOMI. Perrla. Funduscopic exam benign, without hemorrhages, exudates or papilledema. Vision grossly normal. Ears:  External ear exam shows no significant lesions or deformities.  Otoscopic examination reveals clear canals, tympanic membranes are intact bilaterally without bulging, retraction, inflammation or discharge. Hearing is grossly normal bilaterally. Nose:  External nasal examination shows no deformity or inflammation. Nasal mucosa are pink and moist without lesions or exudates. Mouth:  Oral mucosa and oropharynx without lesions or exudates.  Teeth in good repair. Neck:  No deformities, masses, or tenderness noted. Chest Wall:  No deformities, masses, or tenderness noted. Lungs:  Normal respiratory effort, chest expands symmetrically. Lungs are clear to auscultation, no crackles or wheezes. Heart:  Normal rate and regular rhythm. S1 and S2 normal without gallop, murmur, click, rub or other extra sounds. EKG normal Abdomen:  Bowel sounds positive,abdomen soft and non-tender without masses, organomegaly or hernias noted. Msk:  No deformity or scoliosis noted of thoracic or lumbar spine.   Pulses:  R and L carotid,radial,femoral,dorsalis pedis and posterior tibial pulses are full and equal bilaterally Extremities:  No clubbing, cyanosis, edema, or deformity noted with normal full range of motion of all joints.   Neurologic:  No cranial nerve deficits noted. Station and gait are normal. Plantar reflexes are down-going bilaterally. DTRs are symmetrical throughout. Sensory, motor and coordinative functions appear intact. Skin:  Intact without suspicious lesions or rashes Cervical Nodes:  No lymphadenopathy noted Axillary Nodes:  No palpable lymphadenopathy Inguinal Nodes:  No significant adenopathy Psych:   Cognition and judgment appear intact. Alert and cooperative with normal attention span and concentration. No apparent delusions, illusions, hallucinations   Impression & Recommendations:  Problem # 1:  WELL ADULT EXAM (ICD-V70.0)  Orders: EKG w/ Interpretation (93000)  Complete Medication List: 1)  Calcium 600 1500 Mg Tabs (Calcium carbonate) .Marland Kitchen.. 1 by mouth once daily 2)  Vitamin D 1000 Unit Tabs (Cholecalciferol) .... 2 by mouth once daily  Patient Instructions: 1)  Please schedule a follow-up appointment in 1 year.  2)  It is important that you exercise reguarly at least 20 minutes 5 times a week. If you develop chest pain, have severe difficulty breathing, or feel very tired, stop exercising immediately and seek medical attention.   Preventive Care Screening  Colonoscopy:    Date:  04/12/2002    Results:  normal

## 2010-05-12 NOTE — Assessment & Plan Note (Signed)
Summary: Anne Martin   Primary Provider:  Dr. Shellia Carwin  CC:  Follow up.  History of Present Illness: This is a 65 year old white female patient who underwent stenting of her first diagonal with a drug-eluting stent Sep 01, 2009 after her on abnormal stress Myoview involving ischemia of the entire anterior wall.  The patient is quite anxious about her diagnosis of coronary artery disease and I spent a lot of time counseling her on this. She denies any chest pain palpitations dyspnea dyspnea on exertion dizziness or presyncope since she's been home. She is going to begin cardiac rehabilitation next week. She is a little fatigued.She is overweight but is doing Weight Watchers and losing weight slowly.  Current Medications (verified): 1)  Calcium 600 1500 Mg  Tabs (Calcium Carbonate) .Marland Kitchen.. 1 Tab By Mouth Two Times A Day 2)  Vitamin D 1000 Unit  Tabs (Cholecalciferol) .... 2 By Mouth Once Daily 3)  Claritin 10 Mg Tabs (Loratadine) .... As Needed 4)  Flonase 50 Mcg/act Susp (Fluticasone Propionate) .... 2 Sprays Each Nostril Once Daily 5)  Aspirin Ec 325 Mg Tbec (Aspirin) .... Take One Tablet By Mouth Daily 6)  Metoprolol Tartrate 25 Mg Tabs (Metoprolol Tartrate) .... Take One Tablet By Mouth Twice A Day 7)  Effient 10 Mg Tabs (Prasugrel Hcl) .Marland Kitchen.. 1 Tab By Mouth Once Daily 8)  Nitrostat 0.4 Mg Subl (Nitroglycerin) .Marland Kitchen.. 1 Tablet Under Tongue At Onset of Chest Pain; You May Repeat Every 5 Minutes For Up To 3 Doses. 9)  Crestor 10 Mg Tabs (Rosuvastatin Calcium) .... Take One Tablet By Mouth Daily. 10)  Vagifem 10 Mcg Tabs (Estradiol) .... As Directed  Allergies: 1)  ! 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 Hessie Diener at [facility] Cholecystectomy 2004 Hysterectomy 1994, ovaries intact colonoscopy in [place], normal, repeat in 10 yrs  Family History: Last updated: 09/01/2009 Mother with history of valve replacement Father with CABG at age 76 Brother with stent placed at age 72  Social History: Last updated: 09/01/2009 Retired Married Former Smoker - Quit at age 87 Alcohol use-rarely Drug use-no Regular exercise-yes  Review of Systems       see history of present illness  Vital Signs:  Patient profile:   65 year old female Height:      64 inches Weight:      177 pounds BMI:     30.49 Pulse rate:   56 / minute Resp:     12 per minute BP sitting:   118 / 72  (left arm)  Vitals Entered By: Kem Parkinson (September 15, 2009 9:33 AM)  Physical Exam  General:   Well-nournished, in no acute distress. Neck: bilateral carotid bruits versus murmur portrayed, No JVD, HJR, or thyroid enlargement Lungs: No tachypnea, clear without wheezing, rales, or rhonchi Cardiovascular: RRR, PMI not displaced, 1-2/6 systolic murmur at the left sternal border and apex, no gallops, bruit, thrill, or heave. Abdomen: BS normal. Soft without organomegaly, masses, lesions or tenderness. Extremities: right groin without hematoma or hemorrhage, some ecchymosis,without cyanosis, clubbing or edema. Good distal pulses bilateral SKin: Warm, no lesions or rashes  Musculoskeletal: No deformities Neuro: no focal signs  EKG  Procedure date:  09/15/2009  Findings:      sinus bradycardia no acute change  Impression & Recommendations:  Problem # 1:  CAD, NATIVE VESSEL (ICD-414.01) Patient underwent stenting of the first diagonal with a drug-eluting stent Sep 01, 2009 after stress Myoview showing severe ischemia involving the anterior wall. Otherwise she had nonobstructive disease and normal LV function. She has no chest pressure since. Her updated medication  list for this problem includes:    Aspirin Ec 325 Mg Tbec (Aspirin) .Marland Kitchen... Take one tablet by mouth daily    Metoprolol Tartrate 25 Mg Tabs (Metoprolol tartrate) .Marland Kitchen... Take one tablet by mouth twice a day    Effient 10 Mg Tabs (Prasugrel hcl) .Marland Kitchen... 1 tab by mouth once daily    Nitrostat 0.4 Mg Subl (Nitroglycerin) .Marland Kitchen... 1 tablet under tongue at onset of chest pain; you may repeat every 5 minutes for up to 3 doses.  Problem # 2:  HEART MURMUR, HX OF (ICD-V12.50) 2-D echo in the hospital revealed normal LV function ejection fraction 55-60% with mild aortic regurgitation mild AS and grade 1 diastolic dysfunction. She also had trivial MR  Problem # 3:  CAROTID BRUIT (ICD-785.9) Patient has bilateral carotid bruits although this could be her aortic murmur for trade. We will check carotid Dopplers to make sure. Orders: Carotid Duplex (Carotid Duplex)  Problem # 4:  CHEST PAIN-PRECORDIAL (YHC-623.76) Patient has a five-year history of sharp shooting stabbing left-sided chest pain that is periodic. She did have a left rib series while in the hospital that was normal. She has been told in the past that this is musculoskeletal. Her updated medication list for this problem includes:    Aspirin Ec 325 Mg Tbec (Aspirin) .Marland Kitchen... Take one tablet by mouth daily    Metoprolol Tartrate 25 Mg Tabs (Metoprolol tartrate) .Marland Kitchen... Take one tablet by mouth twice a day    Effient 10 Mg Tabs (Prasugrel hcl) .Marland Kitchen... 1 tab by mouth once daily    Nitrostat 0.4 Mg Subl (Nitroglycerin) .Marland Kitchen... 1 tablet under tongue at onset of chest pain; you may repeat every 5 minutes for up to 3 doses.  Patient Instructions: 1)  Your physician recommends that you schedule a follow-up appointment in: 2 months with dr Jens Som 2)  Your physician recommends that you return for lab work in: fasting liver and lipid 3)  Your physician has requested that you have a carotid duplex. This test is an ultrasound of the carotid arteries in your neck. It  looks at blood flow through these arteries that supply the brain with blood. Allow one hour for this exam. There are no restrictions or special instructions.

## 2010-05-12 NOTE — Progress Notes (Signed)
Summary: faxed stress echo and ekg to Dr.Wall.  Phone Note Other Incoming   Call placed by: Cetta from Dr. Daleen [facility] Call placed to: medical records Summary of Call: Cetta  need stress echo and ekg faxed to Dr. Daleen Squ[facility] Initial call taken by: Drue Stager,  Sep 06, 2007 12:12 PM  Follow-up for Phone Call        faxed ekg and stress echo to Dr. wall. Follow-up by: Drue Stager,  Sep 06, 2007 12:13 PM

## 2010-05-12 NOTE — Letter (Signed)
Summary: Cardiac Rehabilitation Program  Cardiac Rehabilitation Program   Imported By: Debby Freiberg 10/08/2009 13:45:15  _____________________________________________________________________  External Attachment:    Type:   Image     Comment:   External Document

## 2010-05-12 NOTE — Cardiovascular Report (Signed)
Summary: Pre Cath/Percutaneous Orders   Pre Cath/Percutaneous Orders   Imported By: Roderic Ovens 09/04/2009 13:32:37  _____________________________________________________________________  External Attachment:    Type:   Image     Comment:   External Document

## 2010-05-12 NOTE — Assessment & Plan Note (Signed)
Summary: f87m/dm   Primary Provider:  Dr. Shellia Carwin  CC:  6 month ROV; No Complaints; Needs NTG refill.  History of Present Illness: Pleasant femaleI saw in May of 2011 with chest pain and an abnormal nuclear study. Cardiac catheterization was scheduled and performed in May of 2011. This revealed Normal LM, 60 LAD,   90 D2, 50-60 LCx, normal RCA, and nl LV function. Patient had PCI of the diagonal with DES at that time. Echocardiogram in May of 2011 revealed normal LV function, mildly elevated LVOT velocity at 2.2 m/s but visually the aortic valve appeared to open well. There was mild aortic insufficiency. Carotid Dopplers in July of 2011 showed 0-39% bilateral stenosis. I last saw her in July of 2011. Since then, the patient denies any dyspnea on exertion, orthopnea, PND, pedal edema, palpitations, syncope or chest pain. She does have occasional left shoulder and arm pain after exercising but not while she is doing this. It is unlike her pain prior to her stent.   Current Medications (verified): 1)  Calcium 600 1500 Mg  Tabs (Calcium Carbonate) .Marland Kitchen.. 1 Tab By Mouth Two Times A Day 2)  Vitamin D 1000 Unit  Tabs (Cholecalciferol) .... Take 1 By Mouth At Bedtime 3)  Claritin 10 Mg Tabs (Loratadine) .... As Needed 4)  Flonase 50 Mcg/act Susp (Fluticasone Propionate) .... 2 Sprays Each Nostril Once Daily 5)  Aspirin 81 Mg Tbec (Aspirin) .... Take One Tablet By Mouth Daily 6)  Metoprolol Succinate 25 Mg Xr24h-Tab (Metoprolol Succinate) .Marland Kitchen.. 1 Once Daily 7)  Effient 10 Mg Tabs (Prasugrel Hcl) .Marland Kitchen.. 1 Tab By Mouth Once Daily 8)  Nitrostat 0.4 Mg Subl (Nitroglycerin) .Marland Kitchen.. 1 Tablet Under Tongue At Onset of Chest Pain; You May Repeat Every 5 Minutes For Up To 3 Doses. 9)  Simvastatin 40 Mg Tabs (Simvastatin) .... Take 1 Tablet At Bedtime 10)  Vagifem 10 Mcg Tabs (Estradiol) .... As Directed  Allergies: 1)  ! 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 Right knee arthroscopy  7/08 & 3/09, last per Dr. Hessie Diener at [facility] Cholecystectomy 2004 Hysterectomy 1994, ovaries intact colonoscopy in [place], normal, repeat in 10 yrs  Social History: Reviewed history from 09/01/2009 and no changes required. Retired Married Former Smoker - Quit at age 62 Alcohol use-rarely Drug use-no Regular exercise-yes  Review of Systems       no fevers or chills, productive cough, hemoptysis, dysphasia, odynophagia, melena, hematochezia, dysuria, hematuria, rash, seizure activity, orthopnea, PND, pedal edema, claudication. Remaining systems are negative.   Vital Signs:  Patient profile:   65 year old female Height:      65 inches Weight:      179.50 pounds BMI:     29.98 Pulse rate:   58 / minute BP sitting:   134 / 70  (left arm) Cuff size:   regular  Vitals Entered By: Stanton Kidney, EMT-P (March 17, 2010 9:19 AM)  Physical Exam  General:  Well-developed well-nourished in no acute distress.  Skin is warm and dry.  HEENT is normal.  Neck is supple. No thyromegaly.  Chest is clear to auscultation with normal expansion.  Cardiovascular exam is regular rate and rhythm.  Abdominal exam nontender or distended. No masses palpated. Extremities show no edema. neuro grossly intact    EKG  Procedure date:  03/17/2010  Findings:      Sinus bradycardia with no ST changes.  Impression & Recommendations:  Problem # 1:  HYPERLIPIDEMIA (ICD-272.4)  Continue statin. Check lipids and liver. Her updated medication list for this problem includes:    Simvastatin 40 Mg Tabs (Simvastatin) .Marland Kitchen... Take 1 tablet at bedtime  Orders: TLB-Lipid Panel (80061-LIPID)  Her updated medication list for this problem includes:    Simvastatin 40 Mg Tabs (Simvastatin) .Marland Kitchen... Take 1 tablet at bedtime  Problem #  2:  CAD, NATIVE VESSEL (ICD-414.01)  Continue present medications. Continue risk factor modification. Her updated medication list for this problem includes:    Aspirin 81 Mg Tbec (Aspirin) .Marland Kitchen... Take one tablet by mouth daily    Metoprolol Succinate 25 Mg Xr24h-tab (Metoprolol succinate) .Marland Kitchen... 1 once daily    Effient 10 Mg Tabs (Prasugrel hcl) .Marland Kitchen... 1 tab by mouth once daily    Nitrostat 0.4 Mg Subl (Nitroglycerin) .Marland Kitchen... 1 tablet under tongue at onset of chest pain; you may repeat every 5 minutes for up to 3 doses.  Orders: EKG w/ Interpretation (93000)  Her updated medication list for this problem includes:    Aspirin 81 Mg Tbec (Aspirin) .Marland Kitchen... Take one tablet by mouth daily    Metoprolol Succinate 25 Mg Xr24h-tab (Metoprolol succinate) .Marland Kitchen... 1 once daily    Effient 10 Mg Tabs (Prasugrel hcl) .Marland Kitchen... 1 tab by mouth once daily    Nitrostat 0.4 Mg Subl (Nitroglycerin) .Marland Kitchen... 1 tablet under tongue at onset of chest pain; you may repeat every 5 minutes for up to 3 doses.  Problem # 3:  ARM PAIN (ICD-729.5) Most likely musculoskeletal. Unlike previous cardiac pain.  Problem # 4:  HEART MURMUR, HX OF (ICD-V12.50) Repeat echocardiogram in one year when she returns. Mildly elevated LV OT velocity on previous echo as well as mild aortic insufficiency.  Other Orders: TLB-Hepatic/Liver Function Pnl (80076-HEPATIC)  Patient Instructions: 1)  Your physician recommends that you schedule a follow-up appointment in: YEAR WITH DR CRENSHAW 2)  Your physician recommends that you return for lab work in: TODAY LIPID LIVER 272.4 V58.69 3)  Your physician recommends that you continue on your current medications as directed. Please refer to the Current Medication list given to you today. Prescriptions: NITROSTAT 0.4 MG SUBL (NITROGLYCERIN) 1 tablet under tongue at onset of chest pain; you may repeat every 5 minutes for up to 3 doses.  #25 x 4   Entered by:   Scherrie Bateman, LPN   Authorized by:   Ferman Hamming, MD, [facility]   Signed by:   Scherrie Bateman, LPN on 16/01/9603   Method used:   [facility]        [facility]  [place] [address] (retail)       [address]       [place], [place]  [address]       Ph: [phone] or [phone]       Fax: [phone]   RxID:   [id number]

## 2010-05-12 NOTE — Progress Notes (Signed)
Summary: Pt req Flonase called in [facility] [place], [phone]  Phone Note Call from Patient Call back at [phone]       Caller: Patient Summary of Call: Pt called and is out of state in [facility] for a few months. Pt is req script for [facility] [place], [place] [phone] Initial call taken by: Lucy Antigua,  April 17, 2009 2:19 PM  Follow-up for Phone Call        Phone Call Completed, Rx Called In Follow-up by: Alfred Levins, CMA,  April 18, 2009 11:19 AM    New/Updated Medications: FLONASE 50 MCG/ACT SUSP (FLUTICASONE PROPIONATE) 2 sprays each nostril once daily Prescriptions: FLONASE 50 MCG/ACT SUSP (FLUTICASONE PROPIONATE) 2 sprays each nostril once daily  #1 x 2   Entered by:   Alfred Levins, CMA   Authorized by:   Nelwyn Salisbury MD   Signed by:   Alfred Levins, CMA on 04/18/2009   Method used:   Telephoned to ...       [facility]  [place] [address] (retail)       [address]       [place], [place]  [address]       Ph: [phone] or [phone]       Fax: [phone]   RxID:   [id number]

## 2010-05-12 NOTE — Miscellaneous (Signed)
Summary: Flu Shot/[facility]  Flu Shot/[facility]   Imported By: Maryln Gottron 01/23/2010 15:27:03  _____________________________________________________________________  External Attachment:    Type:   Image     Comment:   External Document

## 2010-05-12 NOTE — Assessment & Plan Note (Signed)
Summary: rov WORK IN   CC:  Follow up stress test.  History of Present Illness: 65 yo female with abnormal nuclear study. Over the past one month the patient notices a burning substernal chest pain when she exerts herself  that is relieved with rest. It does not radiate. There is associated shortness of breath but there is no nausea, vomiting or diaphoresis. The pain is not pleuritic or positional nor is it related to food. She was scheduled to have a Myoview today. She had chest pain on the treadmill as well as ECG changes. She also was found to have severe ischemia involving the anterior wall as well as apex. She is presently pain-free. There is no orthopnea, PND, pedal edema, syncope or claudication.  Current Medications (verified): 1)  Calcium 600 1500 Mg  Tabs (Calcium Carbonate) .Marland Kitchen.. 1 Tab By Mouth Two Times A Day 2)  Vitamin D 1000 Unit  Tabs (Cholecalciferol) .... 2 By Mouth Once Daily 3)  Claritin 10 Mg Tabs (Loratadine) .... As Needed 4)  Flonase 50 Mcg/act Susp (Fluticasone Propionate) .... 2 Sprays Each Nostril Once Daily 5)  Aspir-Low 81 Mg Tbec (Aspirin) .... Qd  Allergies: 1)  ! 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 Right knee arthroscopy  7/08 & 3/09, last per Dr. Hessie Diener at [facility] Cholecystectomy 2004 Hysterectomy 1994, ovaries intact colonoscopy in [place], normal, repeat in 10 yrs  Family History: Reviewed history from 08/16/2007 and no changes required. Mother with history of valve replacement Father with CABG at age 52 Brother with stent placed at age 17  Social History: Reviewed history from 08/16/2007 and no changes required. Retired Married Former Smoker -  Quit at age 49 Alcohol use-rarely Drug use-no Regular exercise-yes  Review of Systems       Occasional pain in the left lateral chest wall but no fevers or chills, productive cough, hemoptysis, dysphasia, odynophagia, melena, hematochezia, dysuria, hematuria, rash, seizure activity, orthopnea, PND, pedal edema, claudication. Remaining systems are negative.   Vital Signs:  Patient profile:   65 year old female Height:      64 inches Weight:      182 pounds BMI:     31.35 Pulse rate:   70 / minute Resp:     12 per minute BP sitting:   137 / 86  (left arm)  Vitals Entered By: Kem Parkinson (Sep 01, 2009 3:55 PM)  Physical Exam  General:  Well developed/well nourished in NAD Skin warm/dry Patient not depressed No peripheral clubbing Back-normal HEENT-normal/normal eyelids Neck supple/normal carotid upstroke bilaterally; no bruits; no JVD; no thyromegaly chest - CTA/ normal expansion CV - RRR/normal S1 and S2; no  rubs or gallops;  PMI nondisplaced; 1/6 systolic murmur left sternal border and 2/6 diastolic murmur. Abdomen -NT/ND, no HSM, no mass, + bowel sounds, no bruit 2+ femoral pulses, no bruits Ext-no edema, chords, 2+ DP Neuro-grossly nonfocal     Impression & Recommendations:  Problem # 1:  CHEST PAIN (ICD-786.50) Patient symptoms are classic for angina. They are new in onset and consistent with unstable angina. I have reviewed her Myoview with her and I believe it is high risk. I have therefore recommended admission and we will begin aspirin, heparin, statin  and beta blocker. We will proceed with cardiac catheterization tomorrow. The risks and benefits have been discussed and she agrees to proceed. Her updated medication list for this problem includes:    Aspir-low 81 Mg Tbec (Aspirin) ..... Qd  Problem # 2:  HEART MURMUR, HX OF (ICD-V12.50) Patient with history of rheumatic fever. She sounds to have aortic insufficiency on examination. Schedule  echocardiogram.

## 2010-05-12 NOTE — Assessment & Plan Note (Signed)
Summary: sinus infection/mhf   Vital Signs:  Patient Profile:   65 Years Old Female Height:     64.5 inches Weight:      182 pounds Temp:     97.8 degrees F oral Pulse rate:   112 / minute BP sitting:   132 / 84  (left arm) Cuff size:   regular  Vitals Entered By: Alfred Levins, CMA (March 04, 2008 4:12 PM)                 Chief Complaint:  cough, sneezing, fever, and h/a.  History of Present Illness: One week of sinus pressure, HA, ST, and dry cough. No fever.    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 Vincente Poli for gyn exams     Review of Systems  The patient denies anorexia, fever, weight loss, weight gain, vision loss, decreased hearing, hoarseness, chest pain, syncope, dyspnea on exertion, peripheral edema, hemoptysis, abdominal pain, melena, hematochezia, severe indigestion/heartburn, hematuria, incontinence, genital sores, muscle weakness, suspicious skin lesions, transient blindness, difficulty walking, depression, unusual weight change, abnormal bleeding, enlarged lymph nodes, angioedema, breast masses, and testicular masses.     Physical Exam  General:     Well-developed,well-nourished,in no acute distress; alert,appropriate and cooperative throughout examination Head:     Normocephalic and atraumatic without obvious abnormalities. No apparent alopecia or balding. Eyes:     No corneal or conjunctival inflammation noted. EOMI. Perrla. Funduscopic exam benign, without hemorrhages, exudates or papilledema. Vision grossly normal. Ears:     External ear exam shows no significant lesions or deformities.  Otoscopic examination reveals  clear canals, tympanic membranes are intact bilaterally without bulging, retraction, inflammation or discharge. Hearing is grossly normal bilaterally. Nose:     External nasal examination shows no deformity or inflammation. Nasal mucosa are pink and moist without lesions or exudates. Mouth:     Oral mucosa and oropharynx without lesions or exudates.  Teeth in good repair. Neck:     No deformities, masses, or tenderness noted. Lungs:     Normal respiratory effort, chest expands symmetrically. Lungs are clear to auscultation, no crackles or wheezes.    Impression & Recommendations:  Problem # 1:  ACUTE SINUSITIS, UNSPECIFIED (ICD-461.9)  Her updated medication list for this problem includes:    Flonase 50 Mcg/act Susp (Fluticasone propionate) .Marland Kitchen... 2 sprays each nostril once daily    Zithromax Z-pak 250 Mg Tabs (Azithromycin) .Marland Kitchen... As directed   Complete Medication List: 1)  Allegra 180 Mg Tabs (Fexofenadine hcl) .Marland Kitchen.. 1 by mouth once daily as needed 2)  Calcium 600 1500 Mg Tabs (Calcium carbonate) .Marland Kitchen.. 1 by mouth once daily 3)  Flonase 50 Mcg/act Susp (Fluticasone propionate) .... 2 sprays each nostril once daily 4)  Zithromax Z-pak 250 Mg Tabs (Azithromycin) .... As directed   Patient Instructions: 1)  Please schedule a follow-up appointment as needed.   Prescriptions: ZITHROMAX Z-PAK 250 MG TABS (AZITHROMYCIN) as directed  #  1 x 0   Entered and Authorized by:   Nelwyn Salisbury MD   Signed by:   Nelwyn Salisbury MD on 03/04/2008   Method used:   Electronically to        [facility]  [place] [address] (retail)       [address]       [place], [place]  [address]       Ph: [phone] or [phone]       Fax: [phone]   RxID:   [id number]  ]

## 2010-05-12 NOTE — Miscellaneous (Signed)
Summary: [facility] Progress Note   [facility] Note   Imported By: Roderic Ovens 01/08/2010 15:06:15  _____________________________________________________________________  External Attachment:    Type:   Image     Comment:   External Document

## 2010-05-12 NOTE — Assessment & Plan Note (Signed)
Summary: Cardiology Nuclear Study  Nuclear Med Background Indications for Stress Test: Evaluation for Ischemia   History: Echo, GXT  History Comments: '06 GXT: NL '09 Echo: NL EF  Symptoms: Chest Pain with Exertion, DOE, Fatigue, Palpitations, SOB    Nuclear Pre-Procedure Caffeine/Decaff Intake: none NPO After: 8:30 AM Lungs: clear IV 0.9% NS with Angio Cath: 20g     IV Site: (R) AC IV Started by: Stanton Kidney EMT-P Chest Size (in) 34     Cup Size B     Height (in): 66 Weight (lb): 179 BMI: 29.00 Tech Comments: This patient walked for only a short time on the treadmill. She states that she wants to exercise  for alot more, but she gets so sob and has chest heaviness/burning and just can't.  Nuclear Med Study 1 or 2 day study:  1 day     Stress Test Type:  Stress Reading MD:  Willa Rough, MD     Referring MD:  S.Fry Resting Radionuclide:  Technetium 33m Tetrofosmin     Resting Radionuclide Dose:  11 mCi  Stress Radionuclide:  Technetium 16m Tetrofosmin     Stress Radionuclide Dose:  33 mCi   Stress Protocol Exercise Time (min):  5:00 min     Max HR:  144 bpm     Predicted Max HR:  156 bpm  Max Systolic BP: 173 mm Hg     Percent Max HR:  92.31 %     METS: 7.0 Rate Pressure Product:  38756    Stress Test Technologist:  Milana Na EMT-P     Nuclear Technologist:  Domenic Polite CNMT  Rest Procedure  Myocardial perfusion imaging was performed at rest 45 minutes following the intravenous administration of Myoview Technetium 65m Tetrofosmin.  Stress Procedure  The patient exercised for 5;00. The patient stopped due to fatigue, sob, and chest heaviness/burning.  There were + significant ST-T wave changes.  Myoview was injected at peak exercise and myocardial perfusion imaging was performed after a brief delay.  QPS Raw Data Images:  Patient motion noted; appropriate software correction applied. Stress Images:  Significant decrease in activity in the anteroapical  wall Rest Images:  Normal homogeneous uptake in all areas of the myocardium. Subtraction (SDS):  These findings are consistent with ischemia. Transient Ischemic Dilatation:  1.1  (Normal <1.22)  Lung/Heart Ratio:  .32  (Normal <0.45)  Quantitative Gated Spect Images QGS EDV:  72 ml QGS ESV:  15 ml QGS EF:  78 % QGS cine images:  Good motion  Findings High risk nuclear study      Overall Impression  Exercise Capacity: Fair exercise capacity. BP Response: Normal blood pressure response. Clinical Symptoms: Chest heavy ECG Impression: Significant ST abnormalities consistent with ischemia. Overall Impression: Abnormal stress nuclear study. There is significant anteroapical ischemia.

## 2010-06-15 ENCOUNTER — Encounter (INDEPENDENT_AMBULATORY_CARE_PROVIDER_SITE_OTHER): Payer: Self-pay | Admitting: *Deleted

## 2010-06-23 NOTE — Letter (Signed)
Summary: New Patient letter  [facility]  [address] [place], [place] [address]   Phone: [phone]  Fax: [phone]       06/15/2010 MRN: [id number]  Anne Martin [address] [place], [place]  [address]  Dear Ms. Anne Martin,  Welcome to the Gastroenterology Division at [facility].    You are scheduled to see Dr.  Marina Goodell on 07-28-10 at 9:15A.M. on the 3rd floor at [facility], [address]  We ask that you try to arrive at our office 15 minutes prior to your appointment time to allow for check-in.  We would like you to complete the enclosed self-administered evaluation form prior to your visit and bring it with you on the day of your appointment.  We will review it with you.  Also, please bring a complete list of all your medications or, if you prefer, bring the medication bottles and we will list them.  Please bring your insurance card so that we may make a copy of it.  If your insurance requires a referral to see a specialist, please bring your referral form from your primary care physician.  Co-payments are due at the time of your visit and may be paid by cash, check or credit card.     Your office visit will consist of a consult with your physician (includes a physical exam), any laboratory testing he/she may order, scheduling of any necessary diagnostic testing (e.g. x-ray, ultrasound, CT-scan), and scheduling of a procedure (e.g. Endoscopy, Colonoscopy) if required.  Please allow enough time on your schedule to allow for any/all of these possibilities.    If you cannot keep your appointment, please call [phone] to cancel or reschedule prior to your appointment date.  This allows [place] the opportunity to schedule an appointment for another patient in need of care.  If you do not cancel or reschedule by 5 p.m. the business day prior to your appointment date, you will be charged a $50.00 late cancellation/no-show fee.    Thank you for choosing  [facility] for your medical needs.  We appreciate the opportunity to care for you.  Please visit [place] at our website  to learn more about our practice.                     Sincerely,                                                             The Gastroenterology Division

## 2010-06-29 LAB — COMPREHENSIVE METABOLIC PANEL
ALT: 20 U/L (ref 0–35)
ALT: 25 U/L (ref 0–35)
AST: 24 U/L (ref 0–37)
AST: 30 U/L (ref 0–37)
Albumin: 3.3 g/dL — ABNORMAL LOW (ref 3.5–5.2)
Albumin: 4.1 g/dL (ref 3.5–5.2)
Alkaline Phosphatase: 118 U/L — ABNORMAL HIGH (ref 39–117)
Alkaline Phosphatase: 140 U/L — ABNORMAL HIGH (ref 39–117)
BUN: 5 mg/dL — ABNORMAL LOW (ref 6–23)
BUN: 8 mg/dL (ref 6–23)
CO2: 26 mEq/L (ref 19–32)
CO2: 29 mEq/L (ref 19–32)
Calcium: 8.8 mg/dL (ref 8.4–10.5)
Calcium: 9.5 mg/dL (ref 8.4–10.5)
Chloride: 105 mEq/L (ref 96–112)
Chloride: 109 mEq/L (ref 96–112)
Creatinine, Ser: 0.72 mg/dL (ref 0.4–1.2)
Creatinine, Ser: 0.73 mg/dL (ref 0.4–1.2)
GFR calc Af Amer: >60 mL/min (ref >60)
GFR calc Af Amer: >60 mL/min (ref >60)
GFR calc non Af Amer: >60 mL/min (ref >60)
GFR calc non Af Amer: >60 mL/min (ref >60)
Glucose, Bld: 145 mg/dL — ABNORMAL HIGH (ref 70–99)
Glucose, Bld: 95 mg/dL (ref 70–99)
Potassium: 3.6 mEq/L (ref 3.5–5.1)
Potassium: 3.9 mEq/L (ref 3.5–5.1)
Sodium: 138 mEq/L (ref 135–145)
Sodium: 141 mEq/L (ref 135–145)
Total Bilirubin: 0.8 mg/dL (ref 0.3–1.2)
Total Bilirubin: 0.8 mg/dL (ref 0.3–1.2)
Total Protein: 6.1 g/dL (ref 6.0–8.3)
Total Protein: 7.4 g/dL (ref 6.0–8.3)

## 2010-06-29 LAB — CARDIAC PANEL(CRET KIN+CKTOT+MB+TROPI)
CK, MB: 1.4 ng/mL (ref 0.3–4.0)
CK, MB: 2.3 ng/mL (ref 0.3–4.0)
Relative Index: 1.1 (ref 0.0–2.5)
Relative Index: 1.3 (ref 0.0–2.5)
Total CK: 128 U/L (ref 7–177)
Total CK: 183 U/L — ABNORMAL HIGH (ref 7–177)
Troponin I: 0.01 ng/mL (ref 0.00–0.06)
Troponin I: 0.02 ng/mL (ref 0.00–0.06)

## 2010-06-29 LAB — LIPID PANEL
Cholesterol: 145 mg/dL (ref 0–200)
HDL: 61 mg/dL (ref >39)
LDL Cholesterol: 68 mg/dL (ref 0–99)
Total CHOL/HDL Ratio: 2.4 RATIO
Triglycerides: 82 mg/dL (ref <150)
VLDL: 16 mg/dL (ref 0–40)

## 2010-06-29 LAB — CBC
HCT: 38.5 % (ref 36.0–46.0)
HCT: 39.9 % (ref 36.0–46.0)
HCT: 42.5 % (ref 36.0–46.0)
Hemoglobin: 13.1 g/dL (ref 12.0–15.0)
Hemoglobin: 13.7 g/dL (ref 12.0–15.0)
Hemoglobin: 14.5 g/dL (ref 12.0–15.0)
MCHC: 34 g/dL (ref 30.0–36.0)
MCHC: 34.2 g/dL (ref 30.0–36.0)
MCHC: 34.3 g/dL (ref 30.0–36.0)
MCV: 80.9 fL (ref 78.0–100.0)
MCV: 80.9 fL (ref 78.0–100.0)
MCV: 81.4 fL (ref 78.0–100.0)
Platelets: 204 10*3/uL (ref 150–400)
Platelets: 212 10*3/uL (ref 150–400)
Platelets: 226 10*3/uL (ref 150–400)
RBC: 4.73 MIL/uL (ref 3.87–5.11)
RBC: 4.93 MIL/uL (ref 3.87–5.11)
RBC: 5.26 MIL/uL — ABNORMAL HIGH (ref 3.87–5.11)
RDW: 13.9 % (ref 11.5–15.5)
RDW: 14 % (ref 11.5–15.5)
RDW: 14 % (ref 11.5–15.5)
WBC: 7.3 10*3/uL (ref 4.0–10.5)
WBC: 7.6 10*3/uL (ref 4.0–10.5)
WBC: 9.7 10*3/uL (ref 4.0–10.5)

## 2010-06-29 LAB — PROTIME-INR
INR: 1.03 (ref 0.00–1.49)
Prothrombin Time: 13.4 seconds (ref 11.6–15.2)

## 2010-06-29 LAB — TSH: TSH: 2.451 u[IU]/mL (ref 0.350–4.500)

## 2010-06-29 LAB — HEPARIN LEVEL (UNFRACTIONATED): Heparin Unfractionated: 0.65 IU/mL (ref 0.30–0.70)

## 2010-07-16 ENCOUNTER — Telehealth: Payer: Self-pay | Admitting: Cardiology

## 2010-07-16 NOTE — Telephone Encounter (Signed)
Spoke with pt, she has noticed for the last several days an achy feeling and some tingling in both arms and both feet. She worked in the yard earlier this week and did a lot of heavy lifting. She has no SOB or chest burning as she did prior to the stenting she had last year. The tingling comes and goes, she states it feels like pins and needles. Explained to pt that does not sound heart related. She will try taking advil and if cont or symptoms change she will call me back. Deliah Goody

## 2010-07-16 NOTE — Telephone Encounter (Signed)
Pt not feeling well, lot of tingling hands and feet since about 2 days ago

## 2010-07-28 ENCOUNTER — Ambulatory Visit (INDEPENDENT_AMBULATORY_CARE_PROVIDER_SITE_OTHER): Payer: Medicare Other | Admitting: Internal Medicine

## 2010-07-28 ENCOUNTER — Encounter: Payer: Self-pay | Admitting: Internal Medicine

## 2010-07-28 VITALS — BP 118/84 | HR 60 | Ht 65.0 in | Wt 185.0 lb

## 2010-07-28 DIAGNOSIS — I2510 Atherosclerotic heart disease of native coronary artery without angina pectoris: Secondary | ICD-10-CM

## 2010-07-28 DIAGNOSIS — Z1211 Encounter for screening for malignant neoplasm of colon: Secondary | ICD-10-CM

## 2010-07-28 NOTE — Progress Notes (Signed)
HISTORY OF PRESENT ILLNESS:  Anne Martin is a 65 y.o. female with the below listed medical history, most pertinent for coronary artery disease for which she underwent drug-eluting stent placement May 2011 and is currently on Effient therapy. She presents today regarding routine screening colonoscopy. The patient reports to me that she underwent routine screening colonoscopy in [place] approximately 10 years ago. She recognizes that she is due for routine followup and is interested in such. Other than 10 pound weight gain over the past year or so, her GI review of systems is entirely negative. No family history of colon cancer. She wanted to be seen first, prior to scheduling colonoscopy, because of her cardiovascular issues. Followup cardiology evaluations have found her condition to be stable and  REVIEW OF SYSTEMS:  All non-GI ROS negative except for history of heart murmur.  Past Medical History  Diagnosis Date  . Heart murmur   . H/O: rheumatic fever   . Osteopenia   . CAD (coronary artery disease)   . Hyperlipidemia   . Gallstones     Past Surgical History  Procedure Date  . Knee arthroscopy     Rt. Knee  . Cholecystectomy nov 2004  . Vaginal hysterectomy 1994  . Angioplasty may 2011    stents  . Tonsillectomy     Social History Anne Martin  reports that she quit smoking about 33 years ago. Her smoking use included Cigarettes. She has never used smokeless tobacco. She reports that she drinks alcohol. She reports that she does not use illicit drugs.  family history includes Colon cancer in an unspecified family member; Heart disease in her brother, father, and mother; and Ulcerative colitis in her daughter.  Allergies  Allergen Reactions  . Procaine Hcl        PHYSICAL EXAMINATION: Vital signs: BP 118/84  Pulse 60  Ht 5\' 5"  (1.651 m)  Wt 185 lb (83.915 kg)  BMI 30.79 kg/m2  Constitutional: generally well-appearing, no acute distress Psychiatric: alert and  oriented x3, cooperative Eyes: extraocular movements intact, anicteric, conjunctiva pink Mouth: oral pharynx moist, no lesions Neck: supple no lymphadenopathy Cardiovascular: heart regular rate and rhythm, diastolic murmur Lungs: clear to auscultation bilaterally Abdomen: soft, obese, nontender, nondistended, no obvious ascites, no peritoneal signs, normal bowel sounds, no organomegaly Rectal: Deferred until colonoscopy Extremities: no lower extremity edema bilaterally Skin: no lesions on visible extremities Neuro: No focal deficits.   ASSESSMENT:  #1. Screening colonoscopy. The patient is an appropriate candidate without contraindication. The nature of the procedure as well as the risks, benefits, and alternatives were reviewed. She understood and agreed to proceed. She is higher than baseline risk due to the presence of drug-eluting stent and the need to address antiplatelet therapy. She has not been on antiplatelet therapy for quite 1 year. In this regard, he will discuss the feasibility of interrupting antiplatelet therapy at some point in the future and then proceeding with screening colonoscopy. We discussed the pros and cons of performing the procedure on or off antiplatelet therapy. She also expressed an interest in receiving propofol sedation. I explained to her this process and the roll of the CRNA. Also discussed that there may be some cost differential.  #2. Coronary artery disease with the presence of coronary artery drug-eluting stent. Placed May 2011. On Effient  PLAN:  #1. Patient will consult with Dr. Jens Som regarding Effient therapy and colonoscopy. Thereafter she will schedule exam with our previsit nurse.

## 2010-07-28 NOTE — Patient Instructions (Signed)
Check with Dr. Jens Som regarding your Effient. Call our office to schedule Colonoscopy with Propoful and pre-visit appointment.

## 2010-07-29 ENCOUNTER — Telehealth: Payer: Self-pay | Admitting: Cardiology

## 2010-07-29 NOTE — Telephone Encounter (Addendum)
Spoke with pt, she is needing to stop her effient for colonoscopy. She had a DES in may 2011. Will forward for dr Jens Som review Anne Martin

## 2010-07-29 NOTE — Telephone Encounter (Signed)
Dc effient for colonoscopy June 2011 but continue asa. Anne Martin

## 2010-07-30 NOTE — Telephone Encounter (Signed)
Spoke with pt, she is aware to stop the effient and have the colonoscopy in June. She will cont the asa and restart the effient after the colonoscopy. Deliah Goody

## 2010-08-03 NOTE — Progress Notes (Signed)
Anne Martin, please see Dr. Ludwig Clarks note. Accordingly, arrange colonoscopy sometime in June. She will be off Plavix 5 days prior to the procedure but continue on aspirin. Any questions, please ask

## 2010-08-04 ENCOUNTER — Encounter: Payer: Self-pay | Admitting: *Deleted

## 2010-08-04 NOTE — Progress Notes (Signed)
Dr. Mindi Curling is scheduled for 09/29/10 at 1:30 pm with Propoful and Pre-visit 09/23/10 at 8:00 am.  Pt. Aware and letter mailed.

## 2010-08-07 ENCOUNTER — Telehealth: Payer: Self-pay | Admitting: Cardiology

## 2010-08-07 NOTE — Telephone Encounter (Signed)
Per pt had episode of arm pain yesterday bil  with little activity  also c/o heaviness in middle of chest  today has taken 1 ntg  with relief  Has been doing a lot of yard in the past over the last several days has taken it easy .  Has appt on mon 08/10/10 at 9:00 am with  Dr Jens Som .  Per Dr Jens Som keep appt on mon  since is pain free at this time.Informed pt if s/s increase  over weekend to go to nearest er  for evaluation and treatment./cy

## 2010-08-07 NOTE — Telephone Encounter (Signed)
Ok  Anne Martin  

## 2010-08-10 ENCOUNTER — Encounter: Payer: Self-pay | Admitting: Cardiology

## 2010-08-10 ENCOUNTER — Ambulatory Visit (INDEPENDENT_AMBULATORY_CARE_PROVIDER_SITE_OTHER): Payer: Medicare Other | Admitting: Cardiology

## 2010-08-10 DIAGNOSIS — I2510 Atherosclerotic heart disease of native coronary artery without angina pectoris: Secondary | ICD-10-CM

## 2010-08-10 DIAGNOSIS — E785 Hyperlipidemia, unspecified: Secondary | ICD-10-CM

## 2010-08-10 DIAGNOSIS — Z8679 Personal history of other diseases of the circulatory system: Secondary | ICD-10-CM

## 2010-08-10 DIAGNOSIS — R072 Precordial pain: Secondary | ICD-10-CM

## 2010-08-10 DIAGNOSIS — R079 Chest pain, unspecified: Secondary | ICD-10-CM

## 2010-08-10 NOTE — Assessment & Plan Note (Signed)
Continue statin. 

## 2010-08-10 NOTE — Progress Notes (Signed)
[id number] female I saw in May of 2011 with chest pain and an abnormal nuclear study. Cardiac catheterization was scheduled and performed in May of 2011. This revealed Normal LM, 60 LAD,   90 D2, 50-60 LCx, normal RCA, and nl LV function. Patient had PCI of the diagonal with DES at that time. Echocardiogram in May of 2011 revealed normal LV function, mildly elevated LVOT velocity at 2.2 m/s but visually the aortic valve appeared to open well. There was mild aortic insufficiency. Carotid Dopplers in July of 2011 showed 0-39% bilateral stenosis. I last saw her in Dec of 2011. Since then, over the last 2 weeks she has had occasional chest pressure. There some discomfort in the back as well. It is not exertional, pleuritic or positional. It resolves after 5 minutes. She also notes an occasional buzzing sensation in her left chest as well as tingling in her hands and feet bilaterally. There is dyspnea with bending over.  Current Outpatient Prescriptions  Medication Sig Dispense Refill  . aspirin 81 MG tablet Take 81 mg by mouth daily.        . Calcium Carbonate (CALCIUM 600) 1500 MG TABS Take 1 tablet by mouth 2 (two) times daily.       . Cholecalciferol (VITAMIN D) 2000 UNITS CAPS Take 1 capsule by mouth at bedtime.        Marland Kitchen estradiol (VAGIFEM) 25 MCG vaginal tablet Place 25 mcg vaginally daily.        . fluticasone (FLONASE) 50 MCG/ACT nasal spray 2 sprays by Nasal route daily.        . Loratadine (CLARITIN) 10 MG CAPS Take 1 capsule by mouth as needed. 24 hr tabs      . metoprolol succinate (TOPROL-XL) 25 MG 24 hr tablet Take 25 mg by mouth daily.        . nitroGLYCERIN (NITROSTAT) 0.4 MG SL tablet Place 0.4 mg under the tongue every 5 (five) minutes as needed.        . prasugrel (EFFIENT) 10 MG TABS Take 10 mg by mouth daily.        . simvastatin (ZOCOR) 40 MG tablet Take 40 mg by mouth at bedtime.           Past Medical History  Diagnosis Date  . Heart murmur   . H/O: rheumatic fever   .  Osteopenia   . CAD (coronary artery disease)   . Hyperlipidemia   . Gallstones     Past Surgical History  Procedure Date  . Knee arthroscopy     Rt. Knee  . Cholecystectomy nov 2004  . Vaginal hysterectomy 1994  . Angioplasty may 2011    stents  . Tonsillectomy     History   Social History  . Marital Status: Married    Spouse Name: N/A    Number of Children: 2  . Years of Education: N/A   Occupational History  . retired Runner, broadcasting/film/video   .     Social History Main Topics  . Smoking status: Former Smoker    Types: Cigarettes    Quit date: 04/12/1977  . Smokeless tobacco: Never Used   Comment: quit age 13  . Alcohol Use: Yes     rarely  . Drug Use: No  . Sexually Active: Not on file   Other Topics Concern  . Not on file   Social History Narrative  . No narrative on file    ROS: no fevers or chills, productive cough, hemoptysis, dysphasia, odynophagia,  melena, hematochezia, dysuria, hematuria, rash, seizure activity, orthopnea, PND, pedal edema, claudication. Remaining systems are negative.  Physical Exam: Well-developed well-nourished in no acute distress.  Skin is warm and dry.  HEENT is normal.  Neck is supple. No thyromegaly.  Chest is clear to auscultation with normal expansion.  Cardiovascular exam is regular rate and rhythm.  Abdominal exam nontender or distended. No masses palpated. Extremities show no edema. neuro grossly intact  ECG Normal sinus rhythm at a rate of 75. No ST changes.

## 2010-08-10 NOTE — Assessment & Plan Note (Signed)
Symptoms somewhat atypical. Schedule Myoview for risk stratification. 

## 2010-08-10 NOTE — Assessment & Plan Note (Signed)
Continue present medications. She apparently needs a colonoscopy. She can discontinue her effient beginning June 2012 and resume afterwards. However I would like for her to continue her aspirin at the time of the procedure.

## 2010-08-10 NOTE — Assessment & Plan Note (Signed)
Followup echocardiogram December 2012.

## 2010-08-10 NOTE — Patient Instructions (Signed)
Your physician recommends that you schedule a follow-up appointment in: 3 months  Your physician has requested that you have en exercise stress myoview. For further information please visit [email or web address] Please follow instruction sheet, as given.

## 2010-08-11 ENCOUNTER — Ambulatory Visit (HOSPITAL_COMMUNITY): Payer: Medicare Other | Attending: Cardiology | Admitting: Radiology

## 2010-08-11 DIAGNOSIS — R0609 Other forms of dyspnea: Secondary | ICD-10-CM

## 2010-08-11 DIAGNOSIS — R0989 Other specified symptoms and signs involving the circulatory and respiratory systems: Secondary | ICD-10-CM

## 2010-08-11 DIAGNOSIS — I2510 Atherosclerotic heart disease of native coronary artery without angina pectoris: Secondary | ICD-10-CM

## 2010-08-11 DIAGNOSIS — R079 Chest pain, unspecified: Secondary | ICD-10-CM | POA: Insufficient documentation

## 2010-08-11 DIAGNOSIS — R0789 Other chest pain: Secondary | ICD-10-CM

## 2010-08-11 DIAGNOSIS — I4949 Other premature depolarization: Secondary | ICD-10-CM

## 2010-08-11 MED ORDER — TECHNETIUM TC 99M TETROFOSMIN IV KIT
33.0000 | PACK | Freq: Once | INTRAVENOUS | Status: AC | PRN
  Administered 2010-08-11: 33 via INTRAVENOUS

## 2010-08-11 MED ORDER — TECHNETIUM TC 99M TETROFOSMIN IV KIT
11.0000 | PACK | Freq: Once | INTRAVENOUS | Status: AC | PRN
  Administered 2010-08-11: 11 via INTRAVENOUS

## 2010-08-11 NOTE — Progress Notes (Signed)
[facility] SITE 3 NUCLEAR MED [address] [place] [address] [phone]  Cardiology Nuclear Med Study  Anne Martin is a 65 y.o. female [id number] [birth year]   Nuclear Med Background Indication for Stress Test:  Evaluation for Ischemia and Stent Patency History:  5/11 MWU:[id number] ischemia, EF=78%>Stent-Diag.; 5/11 Echo:EF=55-60%, mild AS Cardiac Risk Factors: History of Smoking and Lipids  Symptoms:  Chest Pain/Pressure with and without Exertion (last episode of chest discomfort was yesterday), DOE and Rapid HR   Nuclear Pre-Procedure Caffeine/Decaff Intake:  None NPO After: 8:00am   Lungs:  Clear. IV 0.9% NS with Angio Cath:  20g  IV Site: R Wrist  IV Started by:  Cathlyn Parsons, RN  Chest Size (in):  36 Cup Size: B  Height: 5\' 5"  (1.651 m)  Weight:  178 lb (80.74 kg)  BMI:  Body mass index is 29.62 kg/(m^2). Tech Comments:  Toprol held x 28hrs    Nuclear Med Study 1 or 2 day study: 1 day  Stress Test Type:  Stress  Reading MD: Arvilla Meres, MD  Order Authorizing Provider:  Olga Millers, MD  Resting Radionuclide: Technetium 11m Tetrofosmin  Resting Radionuclide Dose: 11 mCi   Stress Radionuclide:  Technetium 66m Tetrofosmin  Stress Radionuclide Dose: 33 mCi           Stress Protocol Rest HR: 71 Stress HR: 164  Rest BP: 139/72 Stress BP: 180/81  Exercise Time (min): 8:00 METS: 10.1   Predicted Max HR: 155 bpm % Max HR: 105.81 bpm Rate Pressure Product: 36644   Dose of Adenosine (mg):  n/a Dose of Lexiscan: n/a mg  Dose of Atropine (mg): n/a Dose of Dobutamine: n/a mcg/kg/min (at max HR)  Stress Test Technologist: Duke Salvia, CMA-N  Nuclear Technologist:  Domenic Polite, CNMT     Rest Procedure:  Myocardial perfusion imaging was performed at rest 45 minutes following the intravenous administration of Technetium 73m Tetrofosmin. Rest ECG: No acute changes.  Stress Procedure:  The patient exercised for eight  minutes on the treadmill utilizing the Bruce protocol.  The patient stopped due to fatigue and denied any chest pain.  There were nonspecific ST-T wave changes at peak exercise, that quickly resolved and occasional PVC's with rare couplets.  Technetium 67m Tetrofosmin was injected at peak exercise and myocardial perfusion imaging was performed after a brief delay. Stress ECG: Insignificant upsloping ST segment depression.  QPS Raw Data Images:  Normal; no motion artifact; normal heart/lung ratio. Stress Images:  Normal homogeneous uptake in all areas of the myocardium. Rest Images:  Mildly decreased uptake in distal anterior wall and apex on rest images. Subtraction (SDS):  Mildlyl decreased uptake in the distal anterior wall and apex on rest images. Improves with stress.  Transient Ischemic Dilatation (Normal <1.22):  1.01 Lung/Heart Ratio (Normal <0.45):  0.32  Quantitative Gated Spect Images QGS EDV:  62 ml QGS ESV:  9 ml QGS cine images:  NL LV Function; NL Wall Motion QGS EF: 85%  Impression Exercise Capacity:  Fair exercise capacity. BP Response:  Normal blood pressure response. Clinical Symptoms:  There is dyspnea. ECG Impression:  Insignificant upsloping ST segment depression. Comparison with Prior Nuclear Study: Previous anterior ischemia no longer present  Overall Impression:  Normal stress nuclear study.

## 2010-08-12 NOTE — Progress Notes (Signed)
COPY ROUTED TO DR.CRENSHAW.Falecha Clark ° °

## 2010-08-17 NOTE — Progress Notes (Signed)
Chart pulled and available for review Anne Martin

## 2010-08-25 NOTE — Assessment & Plan Note (Signed)
[facility]                            CARDIOLOGY OFFICE NOTE   [name], [name]                         MRN:          [id number]  DATE:09/06/2007                            DOB:          Sep 22, 1945    REASON FOR VISIT:  I was asked by Dr. Gershon Crane to evaluate Anne Martin  with a heart murmur and a history of scarlet fever at 65 years of age.   HISTORY OF PRESENT ILLNESS:  She is 65 years of age, married, white  female.  She is new to the area, having moved here from [place].   She has been told she has had a heart murmur since about age 65 when she  had scarlet fever.  She had a 2-D echocardiogram October 26, 2006, which  showed normal left ventricular chamber size and function, EF 55-60%,  normal left and right atrial size, aortic valve sclerosis.  No  pericardial effusion, mild mitral regurgitation, mild aortic  insufficiency, mild tricuspid regurgitation and a pulmonary artery  systolic pressure of 26 mmHg.  She also had an exercise treadmill which  was negative with adequate heart rate and blood pressure response and no  ischemia by EKG.  This was dated at the same time.   These were performed in [place], [place].   She is having no exertional angina, dyspnea on exertion or chest  discomfort with exertion.  She does have an aching in her left upper  chest that goes into her left arm.  That can literally stay there all  day.  She has been told by Dr. Clent Ridges she may have tendinitis.  She has no  orthopnea, PND or peripheral edema.  She has had no tachy palpitations.  She has had no syncope or presyncope.   PAST MEDICAL HISTORY:  She is intolerant of Novocain.   CURRENT MEDICATIONS:  1. Allegra 180 mg a day.  2. Calcium and vitamin D.  3. Aspirin 81 mg a day.   She used to smoke but quit in 1979.  She does not use recreational  products.  She does not drink any alcohol.   She enjoys walking.  This has been somewhat limited because of a  right  knee surgery in July 2008, but she is getting back into her normal  routine.   PAST SURGICAL HISTORY:  1. Cholecystectomy 2004.  2. Hysterectomy 1994.  3. Right knee meniscal repair July 2008 and March 2009.   FAMILY HISTORY:  Negative for premature coronary disease.   SOCIAL HISTORY:  She is retired since 1999.  She is married and has two  children.  She is new to this area.   REVIEW OF SYSTEMS:  She has some seasonal allergies and hay fever, but  all other review of systems are negative other than the HPI.   PHYSICAL EXAMINATION:  VITAL SIGNS:  Blood pressure is 132/70, her pulse  68 and regular.  EKG is completely normal.  Weighs 173 pounds.  HEENT:  Normocephalic, atraumatic.  PERRL.  Extraocular is intact.  Sclerae are clear.  Facial symmetry is  normal.  Carotid upstrokes are  equal bilaterally without bruits, no JVD.  Thyroid is not enlarged.  Trachea is midline.  LUNGS:  Clear to auscultation.  HEART:  Reveals a nondisplaced PMI.  She has normal S1-S2 without soft  systolic murmur along the left sternal border.  There is no gallop.  There is no right ventricular lift.  ABDOMEN:  Soft, good bowel sounds.  No midline bruit.  No hepatomegaly.  EXTREMITIES:  No cyanosis, clubbing or edema.  Pulses are intact.  NEUROLOGICAL:  Intact.  SKIN:  Unremarkable.  MUSCULOSKELETAL:  Benign.   STUDIES:  EKG again is normal.   ASSESSMENT:  1. Noncardiac chest pain.  2. Mild mitral regurgitation.  3. Mild aortic insufficiency.  4. Mild tricuspid regurgitation.   PLAN:  Repeat 2-D echocardiogram to reassess her degree of valvular  insufficiency.  I suspect this is not a major clinical issue.  She asked  me about SBE prophylaxis, and I told her she did not need this under  current recommendations.  Will plan on seeing her back again in 2 years  unless she has significant regurgitation.     Thomas C. Daleen Squibb, MD, [facility]  Electronically Signed    TCW/MedQ  DD: 09/06/2007  DT:  09/06/2007  Job #: [id number]   cc:   Alita Chyle Dr. Gershon Crane

## 2010-08-31 ENCOUNTER — Other Ambulatory Visit: Payer: Self-pay | Admitting: Obstetrics and Gynecology

## 2010-08-31 DIAGNOSIS — Z1231 Encounter for screening mammogram for malignant neoplasm of breast: Secondary | ICD-10-CM

## 2010-09-08 ENCOUNTER — Telehealth: Payer: Self-pay | Admitting: Cardiology

## 2010-09-08 NOTE — Telephone Encounter (Signed)
Called patient and advised her that Dr.Crenshaw stated that she could stop Effient 7 days prior to colonoscopy but she needs to remain on ASA. Patient verbalized understanding.

## 2010-09-08 NOTE — Telephone Encounter (Signed)
Pt  Has colonoscopy  6-19, was told to stop effient but forgot when

## 2010-09-18 ENCOUNTER — Other Ambulatory Visit: Payer: Self-pay | Admitting: Cardiology

## 2010-09-19 ENCOUNTER — Other Ambulatory Visit: Payer: Self-pay | Admitting: Cardiology

## 2010-09-23 ENCOUNTER — Encounter: Payer: Self-pay | Admitting: Cardiology

## 2010-09-23 ENCOUNTER — Ambulatory Visit (AMBULATORY_SURGERY_CENTER): Payer: Medicare Other | Admitting: *Deleted

## 2010-09-23 VITALS — Ht 65.0 in | Wt 175.0 lb

## 2010-09-23 DIAGNOSIS — Z1211 Encounter for screening for malignant neoplasm of colon: Secondary | ICD-10-CM

## 2010-09-23 MED ORDER — PEG-KCL-NACL-NASULF-NA ASC-C 100 G PO SOLR: ORAL | Status: DC

## 2010-09-28 ENCOUNTER — Encounter: Payer: Self-pay | Admitting: Family Medicine

## 2010-09-28 ENCOUNTER — Ambulatory Visit (INDEPENDENT_AMBULATORY_CARE_PROVIDER_SITE_OTHER): Payer: Medicare Other | Admitting: Family Medicine

## 2010-09-28 VITALS — BP 120/80 | HR 86 | Temp 98.6°F | Wt 186.0 lb

## 2010-09-28 DIAGNOSIS — J329 Chronic sinusitis, unspecified: Secondary | ICD-10-CM

## 2010-09-28 MED ORDER — AMOXICILLIN-POT CLAVULANATE 875-125 MG PO TABS: 1.0000 | ORAL_TABLET | Freq: Two times a day (BID) | ORAL | Status: AC

## 2010-09-28 NOTE — Progress Notes (Signed)
  Subjective:    Patient ID: Anne Martin, female    DOB: 1945-11-11, 65 y.o.   MRN: [id number]  HPI Here for 3 days of sinus pressure, HA, PND, blowing green mucus from the nose, fever, chest congestion, and a dry cough. Using Mucinex.    Review of Systems  Constitutional: Positive for fever.  HENT: Positive for congestion, sore throat and sinus pressure.   Eyes: Negative.   Respiratory: Positive for cough and wheezing.   Cardiovascular: Negative.        Objective:   Physical Exam  Constitutional: She appears well-developed and well-nourished.  HENT:  Right Ear: External ear normal.  Left Ear: External ear normal.  Nose: Nose normal.  Mouth/Throat: Oropharynx is clear and moist. No oropharyngeal exudate.  Eyes: Conjunctivae are normal. Pupils are equal, round, and reactive to light.  Neck: Normal range of motion. Neck supple. No thyromegaly present.  Cardiovascular: Normal rate, regular rhythm, normal heart sounds and intact distal pulses.   Pulmonary/Chest: Effort normal and breath sounds normal. No respiratory distress. She has no wheezes. She has no rales. She exhibits no tenderness.  Lymphadenopathy:    She has no cervical adenopathy.          Assessment & Plan:  Drink plenty of fluids.

## 2010-09-29 ENCOUNTER — Encounter: Payer: Medicare Other | Admitting: Internal Medicine

## 2010-10-12 ENCOUNTER — Ambulatory Visit: Payer: Medicare Other

## 2010-10-19 ENCOUNTER — Ambulatory Visit (INDEPENDENT_AMBULATORY_CARE_PROVIDER_SITE_OTHER): Payer: Medicare Other | Admitting: Family Medicine

## 2010-10-19 ENCOUNTER — Encounter: Payer: Self-pay | Admitting: Family Medicine

## 2010-10-19 VITALS — BP 120/80 | Temp 98.0°F

## 2010-10-19 DIAGNOSIS — R059 Cough, unspecified: Secondary | ICD-10-CM

## 2010-10-19 DIAGNOSIS — J40 Bronchitis, not specified as acute or chronic: Secondary | ICD-10-CM

## 2010-10-19 DIAGNOSIS — R05 Cough: Secondary | ICD-10-CM

## 2010-10-19 MED ORDER — AZITHROMYCIN 250 MG PO TABS: ORAL_TABLET | ORAL | Status: AC

## 2010-10-19 NOTE — Patient Instructions (Signed)
Call or follow up promptly for any fever or worsening symptoms. Follow up with Dr Clent Ridges if cough no better in 2 weeks.

## 2010-10-19 NOTE — Progress Notes (Signed)
  Subjective:    Patient ID: Anne Martin, female    DOB: Mar 01, 1946, 65 y.o.   MRN: [id number]  HPI Patient is seen with persistent cough. Refer to prior note. Diagnosed with acute sinusitis. Took full 10 day course of Augmentin. Sinus symptoms are actually improved and no headache or fever. She has cough productive of occasional green sputum. Occasional dyspnea but no chest pains. No obvious wheezing. Tried Mucinex for 3 weeks without much improvement. Nonsmoker.  History of CAD. Coronary stent last year. No recent chest pain. Compliant with regular medications.   Review of Systems  Constitutional: Negative for fever and chills.  HENT: Negative for congestion and sore throat.   Respiratory: Positive for cough and shortness of breath. Negative for wheezing.   Cardiovascular: Negative for chest pain, palpitations and leg swelling.       Objective:   Physical Exam  Constitutional: She appears well-developed and well-nourished. No distress.  HENT:  Mouth/Throat: Oropharynx is clear and moist. No oropharyngeal exudate.  Neck: Neck supple. No thyromegaly present.  Cardiovascular: Normal rate and regular rhythm.   Pulmonary/Chest: Effort normal and breath sounds normal. No respiratory distress. She has no wheezes. She has no rales.  Musculoskeletal: She exhibits no edema.  Lymphadenopathy:    She has no cervical adenopathy.          Assessment & Plan:  Persistent cough.  Probable recent acute sinusitis. Given duration of productive cough start Zithromax. Consider chest x-ray and  followup with primary if cough not better in 2 weeks and follow up promptly for any fever or change of symptoms

## 2010-10-26 ENCOUNTER — Ambulatory Visit
Admission: RE | Admit: 2010-10-26 | Discharge: 2010-10-26 | Disposition: A | Discharge status: Home / Self Care | Payer: Medicare Other | Source: Ambulatory Visit | Attending: Obstetrics and Gynecology | Admitting: Obstetrics and Gynecology

## 2010-10-26 DIAGNOSIS — Z1231 Encounter for screening mammogram for malignant neoplasm of breast: Secondary | ICD-10-CM

## 2010-11-12 ENCOUNTER — Encounter: Payer: Self-pay | Admitting: Cardiology

## 2010-11-12 ENCOUNTER — Ambulatory Visit (INDEPENDENT_AMBULATORY_CARE_PROVIDER_SITE_OTHER): Payer: Medicare Other | Admitting: Cardiology

## 2010-11-12 DIAGNOSIS — I2510 Atherosclerotic heart disease of native coronary artery without angina pectoris: Secondary | ICD-10-CM

## 2010-11-12 DIAGNOSIS — Z79899 Other long term (current) drug therapy: Secondary | ICD-10-CM

## 2010-11-12 DIAGNOSIS — E7800 Pure hypercholesterolemia, unspecified: Secondary | ICD-10-CM

## 2010-11-12 DIAGNOSIS — E785 Hyperlipidemia, unspecified: Secondary | ICD-10-CM

## 2010-11-12 DIAGNOSIS — Z8679 Personal history of other diseases of the circulatory system: Secondary | ICD-10-CM

## 2010-11-12 DIAGNOSIS — R079 Chest pain, unspecified: Secondary | ICD-10-CM

## 2010-11-12 LAB — LIPID PANEL
Cholesterol: 127 mg/dL (ref 0–200)
HDL: 64.5 mg/dL (ref >39.00)
LDL Cholesterol: 51 mg/dL (ref 0–99)
Total CHOL/HDL Ratio: 2
Triglycerides: 59 mg/dL (ref 0.0–149.0)
VLDL: 11.8 mg/dL (ref 0.0–40.0)

## 2010-11-12 LAB — HEPATIC FUNCTION PANEL
ALT: 36 U/L — ABNORMAL HIGH (ref 0–35)
AST: 51 U/L — ABNORMAL HIGH (ref 0–37)
Albumin: 4.8 g/dL (ref 3.5–5.2)
Alkaline Phosphatase: 99 U/L (ref 39–117)
Bilirubin, Direct: 0.1 mg/dL (ref 0.0–0.3)
Total Bilirubin: 0.9 mg/dL (ref 0.3–1.2)
Total Protein: 7.8 g/dL (ref 6.0–8.3)

## 2010-11-12 NOTE — Progress Notes (Signed)
Addended by: Alma Friendly on: 11/12/2010 08:37 AM   Modules accepted: Orders

## 2010-11-12 NOTE — Patient Instructions (Signed)
Please have lab work today  The current medical regimen is effective;  continue present plan and medications.  Follow up in 6 months with Dr Jens Som.  You will receive a letter in the mail 2 months before you are due.  Please call us when you receive this letter to schedule your follow up appointment.

## 2010-11-12 NOTE — Assessment & Plan Note (Signed)
Continue present medications. I will see her back in 6 months and most likely discontinue her effient at that time.

## 2010-11-12 NOTE — Assessment & Plan Note (Signed)
Continue statin. Check lipids and liver. 

## 2010-11-12 NOTE — Progress Notes (Signed)
HPI: Pleasant female I saw in May of 2011 with chest pain and an abnormal nuclear study. Cardiac catheterization was scheduled and performed in May of 2011. This revealed Normal LM, 60 LAD, 90 D2, 50-60 LCx, normal RCA, and nl LV function. Patient had PCI of the diagonal with DES at that time. Echocardiogram in May of 2011 revealed normal LV function, mildly elevated LVOT velocity at 2.2 m/s but visually the aortic valve appeared to open well. There was mild aortic insufficiency. Carotid Dopplers in July of 2011 showed 0-39% bilateral stenosis. I last saw her in April of 2012 and she was complaining of chest pain. Myoview was performed in May of 2012 and showed EF of 85 and normal perfusion. Since I last saw her,  the patient denies any dyspnea on exertion, orthopnea, PND, pedal edema, palpitations, syncope or chest pain.   Current Outpatient Prescriptions  Medication Sig Dispense Refill  . aspirin 81 MG tablet Take 81 mg by mouth daily.        . Calcium Carbonate (CALCIUM 600) 1500 MG TABS Take 1 tablet by mouth 2 (two) times daily.       . Cholecalciferol (VITAMIN D) 2000 UNITS CAPS Take 1 capsule by mouth at bedtime.        Marland Kitchen EFFIENT 10 MG TABS TAKE 1 TABLET BY MOUTH EVERY DAY WITH MEAL  90 tablet  0  . estradiol (VAGIFEM) 25 MCG vaginal tablet Place 25 mcg vaginally daily.        . fluticasone (FLONASE) 50 MCG/ACT nasal spray 2 sprays by Nasal route daily.        . Loratadine (CLARITIN) 10 MG CAPS Take 1 capsule by mouth as needed. 24 hr tabs      . metoprolol succinate (TOPROL-XL) 25 MG 24 hr tablet TAKE 1 TABLET BY MOUTH EVERY DAY  90 tablet  1  . nitroGLYCERIN (NITROSTAT) 0.4 MG SL tablet Place 0.4 mg under the tongue every 5 (five) minutes as needed.        . simvastatin (ZOCOR) 40 MG tablet TAKE 1 TABLET BY MOUTH AT BEDTIME  90 tablet  1     Past Medical History  Diagnosis Date  . Heart murmur   . H/O: rheumatic fever   . Osteopenia   . CAD (coronary artery disease)   .  Hyperlipidemia   . Gallstones   . Allergy     seasonal  . History of colonoscopy     Past Surgical History  Procedure Date  . Knee arthroscopy     Rt. Knee  . Cholecystectomy nov 2004  . Vaginal hysterectomy 1994  . Angioplasty may 2011    stents  . Tonsillectomy   . Colonoscopy     History   Social History  . Marital Status: Married    Spouse Name: N/A    Number of Children: 2  . Years of Education: N/A   Occupational History  . retired Runner, broadcasting/film/video   .     Social History Main Topics  . Smoking status: Former Smoker    Types: Cigarettes    Quit date: 04/12/1977  . Smokeless tobacco: Never Used   Comment: quit age 37  . Alcohol Use: Yes     rarely  . Drug Use: No  . Sexually Active: Not on file   Other Topics Concern  . Not on file   Social History Narrative  . No narrative on file    ROS: no fevers or chills, productive cough, hemoptysis,  dysphasia, odynophagia, melena, hematochezia, dysuria, hematuria, rash, seizure activity, orthopnea, PND, pedal edema, claudication. Remaining systems are negative.  Physical Exam: Well-developed well-nourished in no acute distress.  Skin is warm and dry.  HEENT is normal.  Neck is supple. No thyromegaly.  Chest is clear to auscultation with normal expansion.  Cardiovascular exam is regular rate and rhythm. 1/6 systolic murmur left sternal border. Abdominal exam nontender or distended. No masses palpated. Extremities show no edema. neuro grossly intact  ECG normal sinus rhythm with no ST changes.

## 2010-11-12 NOTE — Assessment & Plan Note (Signed)
No further symptoms. Myoview negative. No further ischemia evaluation.

## 2010-11-12 NOTE — Assessment & Plan Note (Signed)
Followup echocardiogram when she returns in 6 months.

## 2010-11-17 ENCOUNTER — Telehealth: Payer: Self-pay | Admitting: Cardiology

## 2010-11-17 DIAGNOSIS — R7989 Other specified abnormal findings of blood chemistry: Secondary | ICD-10-CM

## 2010-11-17 DIAGNOSIS — E7800 Pure hypercholesterolemia, unspecified: Secondary | ICD-10-CM

## 2010-11-17 DIAGNOSIS — Z79899 Other long term (current) drug therapy: Secondary | ICD-10-CM

## 2010-11-17 NOTE — Telephone Encounter (Signed)
Message copied by Freddi Starr on Tue Nov 17, 2010 11:17 AM ------      Message from: Lewayne Bunting      Created: Thu Nov 12, 2010  4:25 PM       Change zocor to 20 mg daily; lipids and liver in six weeks.      Olga Millers

## 2010-11-17 NOTE — Telephone Encounter (Signed)
Test results

## 2010-11-27 ENCOUNTER — Telehealth: Payer: Self-pay | Admitting: Internal Medicine

## 2010-11-27 ENCOUNTER — Telehealth: Payer: Self-pay | Admitting: Cardiology

## 2010-11-27 NOTE — Telephone Encounter (Signed)
Will forward for dr crenshaw review Clarene Curran  

## 2010-11-27 NOTE — Telephone Encounter (Signed)
Pt called to rescheduled her propofol colon. Scheduled it for 12/22/10@8 :30am. Pt know to be here at 7:30am. Reviewed all of prep instructions in detail with pt. Called Dr. Ludwig Clarks office to get instructions regarding the effient and her procedure. Left message for Dr. Ludwig Clarks nurse.

## 2010-11-27 NOTE — Telephone Encounter (Signed)
Anne Martin calling, pt trying to R.S her colonoscopy. Pt need to stop effient  7 days prior . Surgery on 8/30. Fax # [phone].

## 2010-11-28 NOTE — Telephone Encounter (Signed)
Ok to [employer] and not resume [facility]

## 2010-11-30 NOTE — Telephone Encounter (Signed)
Colon is actually scheduled for 12/22/10. Thanks.

## 2010-11-30 NOTE — Telephone Encounter (Signed)
Spoke with pt, she is aware to stop the effient will forward tpo the GI department Anne Martin

## 2010-11-30 NOTE — Telephone Encounter (Signed)
Per Dr. Jens Som ok to stop Effient and not resume. See note from cardiology.

## 2010-12-22 ENCOUNTER — Ambulatory Visit (AMBULATORY_SURGERY_CENTER): Payer: Medicare Other | Admitting: Internal Medicine

## 2010-12-22 ENCOUNTER — Encounter: Payer: Self-pay | Admitting: Internal Medicine

## 2010-12-22 VITALS — BP 127/58 | HR 63 | Temp 97.4°F | Resp 17 | Ht 66.0 in | Wt 175.0 lb

## 2010-12-22 DIAGNOSIS — D126 Benign neoplasm of colon, unspecified: Secondary | ICD-10-CM

## 2010-12-22 DIAGNOSIS — Z1211 Encounter for screening for malignant neoplasm of colon: Secondary | ICD-10-CM

## 2010-12-22 MED ORDER — SODIUM CHLORIDE 0.9 % IV SOLN: 500.0000 mL | INTRAVENOUS | Status: DC

## 2010-12-22 NOTE — Patient Instructions (Signed)
Polyps, Colon  A polyp is extra tissue that grows inside your body. Colon polyps grow in the large intestine. The large intestine, also called the colon, is part of your digestive system. It is a long, hollow tube at the end of your digestive tract where your body makes and stores stool. Most polyps are not dangerous. They are benign. This means they are not cancerous. But over time, some types of polyps can turn into cancer. Polyps that are smaller than a pea are usually not harmful. But larger polyps could someday become or may already be cancerous. To be safe, doctors remove all polyps and test them.  WHO GETS POLYPS? Anyone can get polyps, but certain people are more likely than others. You may have a greater chance of getting polyps if:  You are over 50.   You have had polyps before.   Someone in your family has had polyps.   Someone in your family has had cancer of the large intestine.   Find out if someone in your family has had polyps. You may also be more likely to get polyps if you:   Eat a lot of fatty foods   Smoke   Drink alcohol   Do not exercise  Eat too much  SYMPTOMS Most small polyps do not cause symptoms. People often do not know they have one until their caregiver finds it during a regular checkup or while testing them for something else. Some people do have symptoms like these:  Bleeding from the anus. You might notice blood on your underwear or on toilet paper after you have had a bowel movement.   Constipation or diarrhea that lasts more than a week.   Blood in the stool. Blood can make stool look black or it can show up as red streaks in the stool.  If you have any of these symptoms, see your caregiver. HOW DOES THE DOCTOR TEST FOR POLYPS? The doctor can use four tests to check for polyps:  Digital rectal exam. The caregiver wears gloves and checks your rectum (the last part of the large intestine) to see if it feels normal. This test would find polyps only  in the rectum. Your caregiver may need to do one of the other tests listed below to find polyps higher up in the intestine.   Barium enema. The caregiver puts a liquid called barium into your rectum before taking x-rays of your large intestine. Barium makes your intestine look white in the pictures. Polyps are dark, so they are easy to see.   Sigmoidoscopy. With this test, the caregiver can see inside your large intestine. A thin flexible tube is placed into your rectum. The device is called a sigmoidoscope, which has a light and a tiny video camera in it. The caregiver uses the sigmoidoscope to look at the last third of your large intestine.   Colonoscopy. This test is like sigmoidoscopy, but the caregiver looks at all of the large intestine. It usually requires sedation. This is the most common method for finding and removing polyps.  TREATMENT  The caregiver will remove the polyp during sigmoidoscopy or colonoscopy. The polyp is then tested for cancer.   If you have had polyps, your caregiver may want you to get tested regularly in the future.  PREVENTION There is not one sure way to prevent polyps. You might be able to lower your risk of getting them if you:  Eat more fruits and vegetables and less fatty food.     Do not smoke.   Avoid alcohol.   Exercise every day.   Lose weight if you are overweight.   Eating more calcium and folate can also lower your risk of getting polyps. Some foods that are rich in calcium are milk, cheese, and broccoli. Some foods that are rich in folate are chickpeas, kidney beans, and spinach.   Aspirin might help prevent polyps. Studies are under way.  Document Released: 12/24/2003 Document Re-Released: 09/16/2009 [facility] Patient Information 2011 [place], [place].      If you have any questions, please call [phone].  Thank-you for choosing [place] today.

## 2010-12-22 NOTE — Progress Notes (Signed)
PROPOFOL ADMINISTERED BY Dwan Bolt CRNA PER PROTOCOL- PLEASE SEE SCANNED INTRA PROCEDURE REPORT- EWM

## 2010-12-23 ENCOUNTER — Telehealth: Payer: Self-pay

## 2010-12-23 NOTE — Telephone Encounter (Signed)

## 2010-12-29 ENCOUNTER — Ambulatory Visit (INDEPENDENT_AMBULATORY_CARE_PROVIDER_SITE_OTHER): Payer: Medicare Other | Admitting: *Deleted

## 2010-12-29 DIAGNOSIS — E7800 Pure hypercholesterolemia, unspecified: Secondary | ICD-10-CM

## 2010-12-29 DIAGNOSIS — R7989 Other specified abnormal findings of blood chemistry: Secondary | ICD-10-CM

## 2010-12-29 DIAGNOSIS — Z79899 Other long term (current) drug therapy: Secondary | ICD-10-CM

## 2010-12-29 LAB — LIPID PANEL
Cholesterol: 165 mg/dL (ref 0–200)
HDL: 57.5 mg/dL (ref >39.00)
LDL Cholesterol: 83 mg/dL (ref 0–99)
Total CHOL/HDL Ratio: 3
Triglycerides: 124 mg/dL (ref 0.0–149.0)
VLDL: 24.8 mg/dL (ref 0.0–40.0)

## 2011-01-01 MED ORDER — ATORVASTATIN CALCIUM 40 MG PO TABS: 40.0000 mg | ORAL_TABLET | Freq: Every day | ORAL | Status: DC

## 2011-01-01 NOTE — Progress Notes (Signed)
PT'S HUSBAND AWARE OF LAB RESULTS  AND WILL RETURN 02-05-11 AT 8:30 AM FOR REPEAT LIPID LIVER .Zack Seal

## 2011-01-20 ENCOUNTER — Ambulatory Visit (INDEPENDENT_AMBULATORY_CARE_PROVIDER_SITE_OTHER): Payer: Medicare Other

## 2011-01-20 DIAGNOSIS — Z23 Encounter for immunization: Secondary | ICD-10-CM

## 2011-01-28 ENCOUNTER — Telehealth: Payer: Self-pay | Admitting: Cardiology

## 2011-01-28 MED ORDER — ATORVASTATIN CALCIUM 40 MG PO TABS: 40.0000 mg | ORAL_TABLET | Freq: Every day | ORAL | Status: DC

## 2011-01-28 NOTE — Telephone Encounter (Signed)
Pt calling asking to speak w/ Stanton Kidney regarding dosage for pt cholesterol medicine. Please return pt call to discuss further.

## 2011-02-04 ENCOUNTER — Other Ambulatory Visit (INDEPENDENT_AMBULATORY_CARE_PROVIDER_SITE_OTHER): Payer: Medicare Other | Admitting: *Deleted

## 2011-02-04 DIAGNOSIS — E7800 Pure hypercholesterolemia, unspecified: Secondary | ICD-10-CM

## 2011-02-04 LAB — LIPID PANEL
Cholesterol: 119 mg/dL (ref 0–200)
HDL: 61 mg/dL (ref >39.00)
LDL Cholesterol: 40 mg/dL (ref 0–99)
Total CHOL/HDL Ratio: 2
Triglycerides: 88 mg/dL (ref 0.0–149.0)
VLDL: 17.6 mg/dL (ref 0.0–40.0)

## 2011-02-05 ENCOUNTER — Encounter: Payer: Self-pay | Admitting: *Deleted

## 2011-02-23 ENCOUNTER — Telehealth: Payer: Self-pay | Admitting: Cardiology

## 2011-02-23 NOTE — Telephone Encounter (Signed)
Spoke with pt husband, she received her results in the mail today. Anne Martin

## 2011-02-23 NOTE — Telephone Encounter (Signed)
Pt calling re changing meds and had blood test  To see hoe meds was working, didn't hear back as to the results and if to continue on meds as prescribed?

## 2011-02-26 ENCOUNTER — Other Ambulatory Visit: Payer: Self-pay | Admitting: *Deleted

## 2011-02-26 MED ORDER — ATORVASTATIN CALCIUM 40 MG PO TABS: 40.0000 mg | ORAL_TABLET | Freq: Every day | ORAL | Status: DC

## 2011-03-21 ENCOUNTER — Other Ambulatory Visit: Payer: Self-pay | Admitting: Cardiology

## 2011-03-25 ENCOUNTER — Ambulatory Visit (INDEPENDENT_AMBULATORY_CARE_PROVIDER_SITE_OTHER): Payer: Medicare Other | Admitting: Cardiology

## 2011-03-25 ENCOUNTER — Encounter: Payer: Self-pay | Admitting: Cardiology

## 2011-03-25 VITALS — BP 120/62 | HR 64 | Ht 66.0 in | Wt 188.0 lb

## 2011-03-25 DIAGNOSIS — Z8679 Personal history of other diseases of the circulatory system: Secondary | ICD-10-CM

## 2011-03-25 DIAGNOSIS — E785 Hyperlipidemia, unspecified: Secondary | ICD-10-CM

## 2011-03-25 DIAGNOSIS — I2510 Atherosclerotic heart disease of native coronary artery without angina pectoris: Secondary | ICD-10-CM

## 2011-03-25 NOTE — Assessment & Plan Note (Signed)
Repeat echocardiogram when she returns from [place] in April 2013. Previous study shows mild aortic stenosis and mild aortic insufficiency.

## 2011-03-25 NOTE — Assessment & Plan Note (Addendum)
Continue statin. Repeat LFTS.

## 2011-03-25 NOTE — Patient Instructions (Signed)
Your physician wants you to follow-up in: ONE YEAR You will receive a reminder letter in the mail two months in advance. If you don't receive a letter, please call our office to schedule the follow-up appointment.  

## 2011-03-25 NOTE — Progress Notes (Signed)
HPI: Pleasant female I saw in May of 2011 with chest pain and an abnormal nuclear study. Cardiac catheterization was scheduled and performed in May of 2011. This revealed Normal LM, 60 LAD, 90 D2, 50-60 LCx, normal RCA, and nl LV function. Patient had PCI of the diagonal with DES at that time. Echocardiogram in May of 2011 revealed normal LV function, mildly elevated LVOT velocity at 2.2 m/s but visually the aortic valve appeared to open well. There was mild aortic insufficiency. Carotid Dopplers in July of 2011 showed 0-39% bilateral stenosis. Myoview was performed in May of 2012 and showed EF of 85 and normal perfusion. Since I last saw her in August of 2012, the patient denies any dyspnea on exertion, orthopnea, PND, pedal edema, palpitations, syncope or chest pain.   Current Outpatient Prescriptions  Medication Sig Dispense Refill  . aspirin 81 MG tablet Take 81 mg by mouth daily.        Marland Kitchen atorvastatin (LIPITOR) 40 MG tablet Take 1 tablet (40 mg total) by mouth daily.  90 tablet  2  . Calcium Carbonate (CALCIUM 600) 1500 MG TABS Take 1 tablet by mouth 2 (two) times daily.       . Cholecalciferol (VITAMIN D) 2000 UNITS CAPS Take 1 capsule by mouth at bedtime.        Marland Kitchen estradiol (VAGIFEM) 25 MCG vaginal tablet Place 25 mcg vaginally daily. Twice weekly      . fluticasone (FLONASE) 50 MCG/ACT nasal spray 2 sprays by Nasal route daily.        . Loratadine (CLARITIN) 10 MG CAPS Take 1 capsule by mouth as needed. 24 hr tabs      . metoprolol succinate (TOPROL-XL) 25 MG 24 hr tablet TAKE 1 TABLET BY MOUTH EVERY DAY  90 tablet  1  . nitroGLYCERIN (NITROSTAT) 0.4 MG SL tablet Place 0.4 mg under the tongue every 5 (five) minutes as needed.           Past Medical History  Diagnosis Date  . Heart murmur   . H/O: rheumatic fever   . Osteopenia   . CAD (coronary artery disease)   . Hyperlipidemia   . Gallstones   . Allergy     seasonal  . History of colonoscopy     Past Surgical History    Procedure Date  . Knee arthroscopy     Rt. Knee  . Cholecystectomy nov 2004  . Vaginal hysterectomy 1994  . Angioplasty may 2011    stents  . Tonsillectomy   . Colonoscopy     History   Social History  . Marital Status: Married    Spouse Name: N/A    Number of Children: 2  . Years of Education: N/A   Occupational History  . retired Runner, broadcasting/film/video   .     Social History Main Topics  . Smoking status: Former Smoker    Types: Cigarettes    Quit date: 04/12/1977  . Smokeless tobacco: Never Used   Comment: quit age 42  . Alcohol Use: Yes     rarely  . Drug Use: No  . Sexually Active: Not on file   Other Topics Concern  . Not on file   Social History Narrative  . No narrative on file    ROS: no fevers or chills, productive cough, hemoptysis, dysphasia, odynophagia, melena, hematochezia, dysuria, hematuria, rash, seizure activity, orthopnea, PND, pedal edema, claudication. Remaining systems are negative.  Physical Exam: Well-developed well-nourished in no acute distress.  Skin is warm and dry.  HEENT is normal.  Neck is supple. No thyromegaly.  Chest is clear to auscultation with normal expansion.  Cardiovascular exam is regular rate and rhythm. 2/6 systolic and diastolic murmur left sternal border. Abdominal exam nontender or distended. No masses palpated. Extremities show no edema. neuro grossly intact  ECG NSR, LAE, no ST changes

## 2011-03-25 NOTE — Assessment & Plan Note (Signed)
Continue aspirin and statin. 

## 2011-03-26 ENCOUNTER — Ambulatory Visit: Payer: Medicare Other | Admitting: *Deleted

## 2011-03-26 ENCOUNTER — Other Ambulatory Visit: Payer: Self-pay | Admitting: *Deleted

## 2011-03-26 DIAGNOSIS — E7800 Pure hypercholesterolemia, unspecified: Secondary | ICD-10-CM

## 2011-03-27 LAB — HEPATIC FUNCTION PANEL
ALT: 27 U/L (ref 0–35)
AST: 29 U/L (ref 0–37)
Albumin: 4.2 g/dL (ref 3.5–5.2)
Alkaline Phosphatase: 119 U/L — ABNORMAL HIGH (ref 39–117)
Bilirubin, Direct: <0.1 mg/dL (ref 0.0–0.3)
Total Bilirubin: 0.6 mg/dL (ref 0.3–1.2)
Total Protein: 7.1 g/dL (ref 6.0–8.3)

## 2011-04-14 ENCOUNTER — Telehealth: Payer: Self-pay | Admitting: Cardiology

## 2011-04-14 NOTE — Telephone Encounter (Signed)
New Problem:    Patient is calling in returning a phone call made before christmas regarding the blood work that she had done.

## 2011-04-14 NOTE — Telephone Encounter (Signed)
Spoke with pt, she is in [place] for the winter. Script mailed to [address], [place] [address]. Pt will have labs drawn there and have faxed to [place]

## 2011-04-29 ENCOUNTER — Encounter: Payer: Self-pay | Admitting: Cardiology

## 2011-04-29 DIAGNOSIS — E7800 Pure hypercholesterolemia, unspecified: Secondary | ICD-10-CM | POA: Diagnosis not present

## 2011-04-29 DIAGNOSIS — Z79899 Other long term (current) drug therapy: Secondary | ICD-10-CM | POA: Diagnosis not present

## 2011-05-06 ENCOUNTER — Telehealth: Payer: Self-pay | Admitting: *Deleted

## 2011-05-06 NOTE — Telephone Encounter (Signed)
Spoke with pt, aware labs are okay

## 2011-07-30 DIAGNOSIS — M25569 Pain in unspecified knee: Secondary | ICD-10-CM | POA: Diagnosis not present

## 2011-09-07 ENCOUNTER — Other Ambulatory Visit: Payer: Self-pay | Admitting: *Deleted

## 2011-09-07 MED ORDER — ATORVASTATIN CALCIUM 40 MG PO TABS: 40.0000 mg | ORAL_TABLET | Freq: Every day | ORAL | Status: DC

## 2011-09-20 ENCOUNTER — Other Ambulatory Visit: Payer: Self-pay | Admitting: Family Medicine

## 2011-09-20 ENCOUNTER — Telehealth: Payer: Self-pay | Admitting: Cardiology

## 2011-09-20 DIAGNOSIS — Z1231 Encounter for screening mammogram for malignant neoplasm of breast: Secondary | ICD-10-CM

## 2011-09-20 NOTE — Telephone Encounter (Signed)
Spoke with Anne Martin, she woke on Saturday with dizziness, headache, a little fever and tingling in her arms and legs. She is really concerned about the tingling she is having. The tingling comes and goes and is not consistent with the symptoms prior to her stenting. She denies SOB or chest pain. She requested to see the NP in dr crenshaw's absence to ease her mind. She will see lori gerhardt np tomorrow morning.

## 2011-09-20 NOTE — Telephone Encounter (Signed)
New Problem:    Patient called in wanting to speak with you because she is not feeling well (dizzy and she feels like she has pins and needles in her arms and legs) and would like to schedule an appointment with Dr. Jens Som soon.  Please call back.

## 2011-09-20 NOTE — Telephone Encounter (Signed)
F/u   Patient calling for status update on request to see Dr. Jens Som today.  She can be reached at [phone].

## 2011-09-21 ENCOUNTER — Ambulatory Visit (INDEPENDENT_AMBULATORY_CARE_PROVIDER_SITE_OTHER): Payer: Medicare Other | Admitting: Nurse Practitioner

## 2011-09-21 ENCOUNTER — Ambulatory Visit (HOSPITAL_COMMUNITY): Payer: Medicare Other | Attending: Internal Medicine | Admitting: Radiology

## 2011-09-21 ENCOUNTER — Encounter: Payer: Self-pay | Admitting: Nurse Practitioner

## 2011-09-21 VITALS — BP 132/68 | HR 64 | Temp 97.0°F | Ht 65.0 in | Wt 182.4 lb

## 2011-09-21 DIAGNOSIS — R42 Dizziness and giddiness: Secondary | ICD-10-CM | POA: Diagnosis not present

## 2011-09-21 DIAGNOSIS — I359 Nonrheumatic aortic valve disorder, unspecified: Secondary | ICD-10-CM

## 2011-09-21 DIAGNOSIS — E785 Hyperlipidemia, unspecified: Secondary | ICD-10-CM

## 2011-09-21 DIAGNOSIS — I2510 Atherosclerotic heart disease of native coronary artery without angina pectoris: Secondary | ICD-10-CM | POA: Insufficient documentation

## 2011-09-21 DIAGNOSIS — Z87891 Personal history of nicotine dependence: Secondary | ICD-10-CM | POA: Diagnosis not present

## 2011-09-21 DIAGNOSIS — I351 Nonrheumatic aortic (valve) insufficiency: Secondary | ICD-10-CM

## 2011-09-21 DIAGNOSIS — I517 Cardiomegaly: Secondary | ICD-10-CM | POA: Insufficient documentation

## 2011-09-21 DIAGNOSIS — R509 Fever, unspecified: Secondary | ICD-10-CM | POA: Diagnosis not present

## 2011-09-21 DIAGNOSIS — I062 Rheumatic aortic stenosis with insufficiency: Secondary | ICD-10-CM

## 2011-09-21 DIAGNOSIS — I00 Rheumatic fever without heart involvement: Secondary | ICD-10-CM | POA: Diagnosis not present

## 2011-09-21 DIAGNOSIS — I679 Cerebrovascular disease, unspecified: Secondary | ICD-10-CM | POA: Insufficient documentation

## 2011-09-21 DIAGNOSIS — R6889 Other general symptoms and signs: Secondary | ICD-10-CM

## 2011-09-21 LAB — HEPATIC FUNCTION PANEL
ALT: 36 U/L — ABNORMAL HIGH (ref 0–35)
AST: 40 U/L — ABNORMAL HIGH (ref 0–37)
Albumin: 4.5 g/dL (ref 3.5–5.2)
Alkaline Phosphatase: 151 U/L — ABNORMAL HIGH (ref 39–117)
Bilirubin, Direct: 0.1 mg/dL (ref 0.0–0.3)
Total Bilirubin: 1 mg/dL (ref 0.3–1.2)
Total Protein: 7.7 g/dL (ref 6.0–8.3)

## 2011-09-21 LAB — BASIC METABOLIC PANEL
BUN: 17 mg/dL (ref 6–23)
CO2: 29 mEq/L (ref 19–32)
Calcium: 9.9 mg/dL (ref 8.4–10.5)
Chloride: 105 mEq/L (ref 96–112)
Creatinine, Ser: 0.8 mg/dL (ref 0.4–1.2)
GFR: 77.26 mL/min (ref >60.00)
Glucose, Bld: 86 mg/dL (ref 70–99)
Potassium: 4.4 mEq/L (ref 3.5–5.1)
Sodium: 142 mEq/L (ref 135–145)

## 2011-09-21 LAB — CBC WITH DIFFERENTIAL/PLATELET
Basophils Absolute: 0.1 10*3/uL (ref 0.0–0.1)
Basophils Relative: 0.8 % (ref 0.0–3.0)
Eosinophils Absolute: 0.2 10*3/uL (ref 0.0–0.7)
Eosinophils Relative: 2.2 % (ref 0.0–5.0)
HCT: 46.1 % — ABNORMAL HIGH (ref 36.0–46.0)
Hemoglobin: 15.3 g/dL — ABNORMAL HIGH (ref 12.0–15.0)
Lymphocytes Relative: 24.5 % (ref 12.0–46.0)
Lymphs Abs: 2 10*3/uL (ref 0.7–4.0)
MCHC: 33.2 g/dL (ref 30.0–36.0)
MCV: 81.4 fl (ref 78.0–100.0)
Monocytes Absolute: 0.6 10*3/uL (ref 0.1–1.0)
Monocytes Relative: 7.7 % (ref 3.0–12.0)
Neutro Abs: 5.3 10*3/uL (ref 1.4–7.7)
Neutrophils Relative %: 64.8 % (ref 43.0–77.0)
Platelets: 209 10*3/uL (ref 150.0–400.0)
RBC: 5.67 Mil/uL — ABNORMAL HIGH (ref 3.87–5.11)
RDW: 14.1 % (ref 11.5–14.6)
WBC: 8.1 10*3/uL (ref 4.5–10.5)

## 2011-09-21 LAB — LIPID PANEL
Cholesterol: 115 mg/dL (ref 0–200)
HDL: 62.7 mg/dL (ref >39.00)
LDL Cholesterol: 39 mg/dL (ref 0–99)
Total CHOL/HDL Ratio: 2
Triglycerides: 65 mg/dL (ref 0.0–149.0)
VLDL: 13 mg/dL (ref 0.0–40.0)

## 2011-09-21 LAB — TSH: TSH: 1.82 u[IU]/mL (ref 0.35–5.50)

## 2011-09-21 MED ORDER — NITROGLYCERIN 0.4 MG SL SUBL: 0.4000 mg | SUBLINGUAL_TABLET | SUBLINGUAL | Status: DC | PRN

## 2011-09-21 MED ORDER — METOPROLOL SUCCINATE ER 25 MG PO TB24: 25.0000 mg | ORAL_TABLET | Freq: Every day | ORAL | Status: DC

## 2011-09-21 NOTE — Progress Notes (Signed)
Echocardiogram performed.  

## 2011-09-21 NOTE — Progress Notes (Signed)
Anne Martin Date of Birth: February 10, 1946 Medical Record #[id number]  History of Present Illness: Ms. Anne Martin is seen today for a work in visit. She is seen for Dr. Jens Som. She has known CAD with prior DES to the diagonal in 2011; negative myoview in 2012. Other problems include mild carotid disease, aortic insufficiency with history of rheumatic fever.   She comes in today. She is here alone. She called yesterday to report an episode where she woke on Saturday with dizziness, headache, slight fever and tingling in her arms and legs. She stayed in the bed all day. Was achy. Thought she had some type of virus. Sunday, she was better but would still have some tingling of her arms and legs that would come and go and this is what she is most concerned about. She has had no chest pain or shortness of breath. Nothing like her symptoms prior to her stent. She has recently started on a diet and trying to get motivated to lose weight. She has no rash. No tick bites noted. No sick contacts. Today she feels just a little lightheaded. No actual spinning reported. No recent URI. She is quite anxious. She is worried that she has diabetes. Blood pressure is usually low but a little higher here today. No recent labs noted. Sees Dr. Abran Cantor for primary care.   Current Outpatient Prescriptions on File Prior to Visit  Medication Sig Dispense Refill  . aspirin 81 MG tablet Take 81 mg by mouth daily.        Marland Kitchen atorvastatin (LIPITOR) 40 MG tablet Take 1 tablet (40 mg total) by mouth daily.  90 tablet  0  . Calcium Carbonate (CALCIUM 600) 1500 MG TABS Take 1 tablet by mouth 2 (two) times daily.       . Cholecalciferol (VITAMIN D) 2000 UNITS CAPS Take 1 capsule by mouth at bedtime.        Marland Kitchen estradiol (VAGIFEM) 25 MCG vaginal tablet Place 25 mcg vaginally daily. Twice weekly      . fluticasone (FLONASE) 50 MCG/ACT nasal spray 2 sprays by Nasal route daily.        . Loratadine (CLARITIN) 10 MG CAPS Take 1 capsule by mouth as  needed. 24 hr tabs      . metoprolol succinate (TOPROL-XL) 25 MG 24 hr tablet TAKE 1 TABLET BY MOUTH EVERY DAY  90 tablet  1  . nitroGLYCERIN (NITROSTAT) 0.4 MG SL tablet Place 0.4 mg under the tongue every 5 (five) minutes as needed.          Allergies  Allergen Reactions  . Procaine Hcl     Past Medical History  Diagnosis Date  . Heart murmur     mild aortic insufficiency per echo in May of 2011, mildly elevated LVOT  . H/O: rheumatic fever   . Osteopenia   . CAD (coronary artery disease)     s/p cath after abnormal nuclear study; normal left main, 60% LAD, 90% 2nd DX, 50 -60% LCX and normal RCA with normal LV function; s/p PCI of the diagonal with DES; normal myoview in May 2012  . Hyperlipidemia   . Gallstones   . Allergy     seasonal  . History of colonoscopy   . Carotid disease, bilateral     0-39% per doppler in July of 2011  . Normal nuclear stress test May 2012    normal EF and normal perfusion.     Past Surgical History  Procedure Date  .  Knee arthroscopy     Rt. Knee  . Cholecystectomy nov 2004  . Vaginal hysterectomy 1994  . Angioplasty may 2011    stents  . Tonsillectomy   . Colonoscopy     History  Smoking status  . Former Smoker  . Types: Cigarettes  . Quit date: 04/12/1977  Smokeless tobacco  . Never Used  Comment: quit age 64    History  Alcohol Use No    rarely    Family History  Problem Relation Age of Onset  . Heart disease Mother   . Heart disease Father     CABG  . Heart disease Brother     2 stents  . Colon cancer      neg. hx.  . Ulcerative colitis Daughter     Review of Systems: The review of systems is per the HPI.  All other systems were reviewed and are negative.  Physical Exam: BP 132/68  Pulse 64  Temp(Src) 97 F (36.1 C) (Oral)  Ht 5\' 5"  (1.651 m)  Wt 182 lb 6.4 oz (82.736 kg)  BMI 30.35 kg/m2 Patient is very pleasant and in no acute distress. She is a little anxious. She is overweight. Skin is warm and dry.  Color is normal. She is suntanned. No rash noted.  HEENT is unremarkable. Normocephalic/atraumatic. PERRL. Sclera are nonicteric. Neck is supple. No masses. No JVD. Lungs are clear. Cardiac exam shows a regular rate and rhythm. She has a murmur of AI noted. Abdomen is soft. Extremities are without edema. Pulses are intact. Gait and ROM are intact. No gross neurologic deficits noted.    LABORATORY DATA:    Chemistry      Component Value Date/Time   NA 141 09/03/2009 0434   K 3.6 09/03/2009 0434   CL 109 09/03/2009 0434   CO2 26 09/03/2009 0434   BUN 5* 09/03/2009 0434   CREATININE 0.73 09/03/2009 0434      Component Value Date/Time   CALCIUM 8.8 09/03/2009 0434   ALKPHOS 119* 03/26/2011 1602   AST 29 03/26/2011 1602   ALT 27 03/26/2011 1602   BILITOT 0.6 03/26/2011 1602     Lab Results  Component Value Date   WBC 7.6 09/03/2009   HGB 13.1 09/03/2009   HCT 38.5 09/03/2009   MCV 81.4 09/03/2009   PLT 204 09/03/2009     Assessment / Plan:

## 2011-09-21 NOTE — Assessment & Plan Note (Addendum)
She presents with a variety of complaints. These include prior fever, aching and lightheadedness with some tingling in all extremities. She has no rash. No known tick bites reported. This sounds more viral related and does not seem to be cardiac related.  Her EKG today is normal. We will update her labs today and will update her echo as well. For now, I have left her on her current medicines. She is to monitor her temperature. I have asked her to touch base with primary care if needed. We also did discuss ways to lose weight as well today. For now, we will see her back at her regular time this fall with Dr. Jens Som. Patient is agreeable to this plan and will call if any problems develop in the interim.

## 2011-09-21 NOTE — Progress Notes (Signed)
Addended by: Vista Mink D on: 09/21/2011 10:35 AM   Modules accepted: Orders

## 2011-09-21 NOTE — Assessment & Plan Note (Signed)
She is currently without any cardiac symptoms.

## 2011-09-21 NOTE — Patient Instructions (Addendum)
I think you have some type of virus. Monitor your temperature. Be on the look out for a rash.   Lets check some blood work today  We are going to update your echo (ultrasound of your heart)  For now stay on your medicines  Here are my tips to lose weight:  1. Drink only water. You do not need milk, juice, tea, soda or diet soda.  2. Do not eat anything "white". This includes white bread, potatoes, rice or mayo  3. Stay away from fried foods and sweets  4. Your portion should be the size of the palm of your hand.  5. Know what your weaknesses are and avoid.  6. Find an exercise you like and do it every day for 45 to 60 minutes.  Goal of exercise is 60 minutes a day  Follow up with your primary care doctor as well if you do not improve  Call the [facility] office at [phone] if you have any questions, problems or concerns.

## 2011-09-22 ENCOUNTER — Telehealth: Payer: Self-pay | Admitting: Nurse Practitioner

## 2011-09-22 NOTE — Telephone Encounter (Signed)
New msg Pt wanted to talk you about tests results

## 2011-09-22 NOTE — Telephone Encounter (Signed)
Test results were given to pt. 

## 2011-09-22 NOTE — Telephone Encounter (Signed)
Pt states she will call back for lab appt when she returns to town.  She is headed out of town next week.

## 2011-09-22 NOTE — Addendum Note (Signed)
Addended by: Reine Just on: 09/22/2011 03:22 PM   Modules accepted: Orders

## 2011-10-08 ENCOUNTER — Ambulatory Visit (INDEPENDENT_AMBULATORY_CARE_PROVIDER_SITE_OTHER): Payer: Medicare Other | Admitting: *Deleted

## 2011-10-08 DIAGNOSIS — J019 Acute sinusitis, unspecified: Secondary | ICD-10-CM | POA: Diagnosis not present

## 2011-10-08 DIAGNOSIS — E785 Hyperlipidemia, unspecified: Secondary | ICD-10-CM | POA: Diagnosis not present

## 2011-10-08 DIAGNOSIS — I2510 Atherosclerotic heart disease of native coronary artery without angina pectoris: Secondary | ICD-10-CM | POA: Diagnosis not present

## 2011-10-08 LAB — HEPATIC FUNCTION PANEL
ALT: 33 U/L (ref 0–35)
AST: 34 U/L (ref 0–37)
Albumin: 4.1 g/dL (ref 3.5–5.2)
Alkaline Phosphatase: 130 U/L — ABNORMAL HIGH (ref 39–117)
Bilirubin, Direct: 0.1 mg/dL (ref 0.0–0.3)
Total Bilirubin: 0.9 mg/dL (ref 0.3–1.2)
Total Protein: 7.2 g/dL (ref 6.0–8.3)

## 2011-10-11 ENCOUNTER — Other Ambulatory Visit: Payer: Medicare Other

## 2011-10-11 ENCOUNTER — Telehealth: Payer: Self-pay | Admitting: Cardiology

## 2011-10-11 DIAGNOSIS — R748 Abnormal levels of other serum enzymes: Secondary | ICD-10-CM

## 2011-10-11 NOTE — Telephone Encounter (Signed)
Fu call Patient calling about test results

## 2011-10-11 NOTE — Telephone Encounter (Signed)
Spoke with pt, she is aware of lab results. She will come back tomorrow for repeat labs.

## 2011-10-12 ENCOUNTER — Other Ambulatory Visit (INDEPENDENT_AMBULATORY_CARE_PROVIDER_SITE_OTHER): Payer: Medicare Other

## 2011-10-12 DIAGNOSIS — R748 Abnormal levels of other serum enzymes: Secondary | ICD-10-CM | POA: Diagnosis not present

## 2011-10-12 LAB — GAMMA GT: GGT: 43 U/L (ref 7–51)

## 2011-10-15 LAB — NUCLEOTIDASE, 5', BLOOD: 5-Nucleotidase: 6 U/L (ref <11)

## 2011-10-19 ENCOUNTER — Encounter: Payer: Self-pay | Admitting: Family Medicine

## 2011-10-19 ENCOUNTER — Ambulatory Visit (INDEPENDENT_AMBULATORY_CARE_PROVIDER_SITE_OTHER): Payer: Medicare Other | Admitting: Family Medicine

## 2011-10-19 VITALS — BP 112/64 | HR 78 | Temp 97.5°F | Wt 184.0 lb

## 2011-10-19 DIAGNOSIS — E538 Deficiency of other specified B group vitamins: Secondary | ICD-10-CM | POA: Diagnosis not present

## 2011-10-19 DIAGNOSIS — G589 Mononeuropathy, unspecified: Secondary | ICD-10-CM | POA: Diagnosis not present

## 2011-10-19 DIAGNOSIS — G629 Polyneuropathy, unspecified: Secondary | ICD-10-CM

## 2011-10-19 LAB — SEDIMENTATION RATE: Sed Rate: 10 mm/hr (ref 0–22)

## 2011-10-19 LAB — FOLATE: Folate: 18.9 ng/mL (ref >5.9)

## 2011-10-19 NOTE — Progress Notes (Signed)
  Subjective:    Patient ID: Anne Martin, female    DOB: 03/03/1946, 66 y.o.   MRN: [id number]  HPI Here for 6 weeks of intermittent numbness and tingling in both arms and both legs, as well as occasional brief spells of dizziness. She describes this a a sense of dysequilibrium or unsteadiness rather than the room spinning around. No nausea. No weakness or pains. No vision changes. No fevers. Some slight HAs off and on. No tick bites or rashes. She saw Cardiology on 09-21-11 and had a normal EKG and ECHO. She had normal labs that day to rule out anemia, diabetes, and thyroid disorders.    Review of Systems  Constitutional: Positive for fatigue. Negative for activity change, appetite change and unexpected weight change.  Eyes: Negative.   Respiratory: Negative.   Cardiovascular: Negative.   Gastrointestinal: Negative.   Musculoskeletal: Negative.   Neurological: Positive for dizziness and numbness. Negative for tremors, seizures, syncope, facial asymmetry, speech difficulty, weakness, light-headedness and headaches.       Objective:   Physical Exam  Constitutional: She appears well-developed and well-nourished.  Eyes: Conjunctivae and EOM are normal. Pupils are equal, round, and reactive to light.  Neck: Neck supple. No thyromegaly present.  Cardiovascular: Normal rate, regular rhythm and intact distal pulses.        Has her usual aortic stenosis murmur   Pulmonary/Chest: Effort normal and breath sounds normal.  Musculoskeletal: Normal range of motion. She exhibits no edema and no tenderness.  Lymphadenopathy:    She has no cervical adenopathy.  Neurological: She is alert. She has normal reflexes. No cranial nerve deficit. She exhibits normal muscle tone. Coordination normal.  Skin: Skin is warm and dry. No rash noted. No erythema.          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 Check for Lyme titers today, also get a ESR and levels of B12 and folate. If  these are normal, we will refer her to Neurology.

## 2011-10-20 LAB — VITAMIN B12: Vitamin B-12: 419 pg/mL (ref 211–911)

## 2011-10-21 ENCOUNTER — Telehealth: Payer: Self-pay | Admitting: Family Medicine

## 2011-10-21 NOTE — Telephone Encounter (Signed)
Pt called req to get lab results.  °

## 2011-10-22 NOTE — Telephone Encounter (Signed)
Left detailed message on machine for patient with lab result

## 2011-10-26 ENCOUNTER — Telehealth: Payer: Self-pay | Admitting: Family Medicine

## 2011-10-26 DIAGNOSIS — G629 Polyneuropathy, unspecified: Secondary | ICD-10-CM

## 2011-10-26 NOTE — Telephone Encounter (Signed)
I did the referral 

## 2011-10-26 NOTE — Telephone Encounter (Signed)
I spoke with pt and gave below message.  

## 2011-10-26 NOTE — Telephone Encounter (Signed)
I spoke with pt and gave negative lyme results. Pt said that the next step would be a referral to see a Neurologist.

## 2011-11-02 DIAGNOSIS — R209 Unspecified disturbances of skin sensation: Secondary | ICD-10-CM | POA: Diagnosis not present

## 2011-11-10 ENCOUNTER — Ambulatory Visit
Admission: RE | Admit: 2011-11-10 | Discharge: 2011-11-10 | Disposition: A | Discharge status: Home / Self Care | Payer: Medicare Other | Source: Ambulatory Visit | Attending: Family Medicine | Admitting: Family Medicine

## 2011-11-10 DIAGNOSIS — Z1231 Encounter for screening mammogram for malignant neoplasm of breast: Secondary | ICD-10-CM

## 2011-11-10 DIAGNOSIS — N952 Postmenopausal atrophic vaginitis: Secondary | ICD-10-CM | POA: Diagnosis not present

## 2011-11-10 DIAGNOSIS — Z01419 Encounter for gynecological examination (general) (routine) without abnormal findings: Secondary | ICD-10-CM | POA: Diagnosis not present

## 2011-11-10 DIAGNOSIS — Z124 Encounter for screening for malignant neoplasm of cervix: Secondary | ICD-10-CM | POA: Diagnosis not present

## 2011-12-01 ENCOUNTER — Other Ambulatory Visit: Payer: Self-pay | Admitting: Cardiology

## 2012-01-13 DIAGNOSIS — Z23 Encounter for immunization: Secondary | ICD-10-CM | POA: Diagnosis not present

## 2012-01-19 ENCOUNTER — Ambulatory Visit (INDEPENDENT_AMBULATORY_CARE_PROVIDER_SITE_OTHER): Payer: Medicare Other | Admitting: Physician Assistant

## 2012-01-19 ENCOUNTER — Other Ambulatory Visit: Payer: Self-pay

## 2012-01-19 ENCOUNTER — Encounter: Payer: Self-pay | Admitting: Physician Assistant

## 2012-01-19 VITALS — BP 134/71 | HR 62 | Ht 65.0 in | Wt 185.0 lb

## 2012-01-19 DIAGNOSIS — I2510 Atherosclerotic heart disease of native coronary artery without angina pectoris: Secondary | ICD-10-CM | POA: Diagnosis not present

## 2012-01-19 DIAGNOSIS — E785 Hyperlipidemia, unspecified: Secondary | ICD-10-CM | POA: Diagnosis not present

## 2012-01-19 DIAGNOSIS — R0989 Other specified symptoms and signs involving the circulatory and respiratory systems: Secondary | ICD-10-CM

## 2012-01-19 DIAGNOSIS — R079 Chest pain, unspecified: Secondary | ICD-10-CM | POA: Diagnosis not present

## 2012-01-19 NOTE — Patient Instructions (Addendum)
**Note De-Identified Mayan Dolney Obfuscation** Your physician has requested that you have en exercise stress myoview. For further information please visit [email or web address] Please follow instruction sheet, as given.  Your physician has requested that you have a carotid duplex. This test is an ultrasound of the carotid arteries in your neck. It looks at blood flow through these arteries that supply the brain with blood. Allow one hour for this exam. There are no restrictions or special instructions.  Your physician recommends that you continue on your current medications as directed. Please refer to the Current Medication list given to you today.  Your provider recommends that you start weight watchers.  Your physician recommends that you schedule a follow-up appointment in: 1 month

## 2012-01-19 NOTE — Assessment & Plan Note (Signed)
Treated and stable °

## 2012-01-19 NOTE — Assessment & Plan Note (Signed)
Patient has bilateral carotid bruits. She has not had Doppler since 2011. We will reassess.

## 2012-01-19 NOTE — Assessment & Plan Note (Signed)
Stress Myoview for recent chest pain.

## 2012-01-19 NOTE — Assessment & Plan Note (Signed)
Chest one-month history of exertional chest tightness when bending over but also when walking up hills in the mountains. She has a history of a drug eluding stent in 2011 to the diagonal with residual coronary disease as listed above. Because of this we will schedule a stress Myoview.

## 2012-01-19 NOTE — Progress Notes (Signed)
HPI:  This is a 66 year old white female patient of Dr. Jens Som who has history of coronary artery disease status post drug-eluting stent to the diagonal and 2011. She had residual 60% LAD, 50-60% circumflex, normal RCA normal LV function. She had a normal Myoview in May 2012.  The patient comes in today with one month history of exertional chest tightness. About a month ago it started when she bent over to pick something up she would get tightness in her chest. Last week and when she was walking in the mountains she developed chest tightness and shortness of breath. When she stopped it eased. She says these are the same symptoms she had when she had her stent.  She was walking 2 and half to 3 miles daily at on a flat surface without symptoms.  She is also having numbness and tingling in her hands and feet which she was told by her primary M.D. That it was neuropathy. She went to a neurologist but never followed up. She continues to have trouble with this.  She saw Meryl Dare N.P. Back in June for multiple complaints including numbness and tingling in her hands.  Allergies:  Procaine Hcl   Current Outpatient Prescriptions on File Prior to Visit: aspirin 81 MG tablet, Take 81 mg by mouth daily.  , Disp: , Rfl:  atorvastatin (LIPITOR) 40 MG tablet, TAKE 1 TABLET BY MOUTH DAILY, Disp: 90 tablet, Rfl: 0 Calcium Carbonate (CALCIUM 600) 1500 MG TABS, Take 1 tablet by mouth once. , Disp: , Rfl:  estradiol (VAGIFEM) 25 MCG vaginal tablet, Place 25 mcg vaginally daily. Twice weekly, Disp: , Rfl:  fluticasone (FLONASE) 50 MCG/ACT nasal spray, Place 2 sprays into the nose daily. AS NEEDED, Disp: , Rfl:  Loratadine (CLARITIN) 10 MG CAPS, Take 1 capsule by mouth as needed. 24 hr tabs, Disp: , Rfl:  metoprolol succinate (TOPROL-XL) 25 MG 24 hr tablet, Take 1 tablet (25 mg total) by mouth daily., Disp: 90 tablet, Rfl: 2 nitroGLYCERIN (NITROSTAT) 0.4 MG SL tablet, Place 1 tablet (0.4 mg total) under the  tongue every 5 (five) minutes as needed., Disp: 50 tablet, Rfl: 2    Past Medical History:   Heart murmur                                                   Comment:mild aortic insufficiency per echo in May of               2011, mildly elevated LVOT   H/O: rheumatic fever                                         Osteopenia                                                   CAD (coronary artery disease)                                  Comment:s/p cath after abnormal nuclear study; normal  left main, 60% LAD, 90% 2nd DX, 50 -60% LCX and              normal RCA with normal LV function; s/p PCI of               the diagonal with DES; normal myoview in May               2012   Hyperlipidemia                                               Gallstones                                                   Allergy                                                        Comment:seasonal   History of colonoscopy                                       Carotid disease, bilateral                                     Comment:0-39% per doppler in July of 2011   Normal nuclear stress test                      May 2012       Comment:normal EF and normal perfusion.   Past Surgical History:   KNEE ARTHROSCOPY                                               Comment:Rt. Knee   CHOLECYSTECTOMY                                 nov 2004     VAGINAL HYSTERECTOMY                            1994         ANGIOPLASTY                                     may 2011       Comment:stents   TONSILLECTOMY                                                COLONOSCOPY  Review of patient's family history indicates:   Heart disease                  Mother                   Heart disease                  Father                     Comment: CABG   Heart disease                  Brother                    Comment: 2 stents   Colon cancer                                               Comment: neg. hx.   Ulcerative colitis             Daughter                 Social History   Marital Status: Married             Spouse Name:                      Years of Education:                 Number of children: 2           Occupational History Occupation          Associate Professor            Comment              retired Runner, broadcasting/film/video                            Social History Main Topics   Smoking Status: Former Smoker                   Packs/Day:       Years:           Types: Cigarettes     Quit date: 04/12/1977   Smokeless Status: Never Used                       Comment: quit age 30   Alcohol Use: No             Drug Use: No             Sexual Activity: Not Currently      Other Topics            Concern   None on file  Social History Narrative   None on file    ROS:see history of present illness otherwise negative. Patient has had trouble losing weight and has not been on a good exercise program.   PHYSICAL EXAM: Well-nournished, in no acute distress. Neck:Bilateral carotid bruits, No JVD, HJR, or thyroid enlargement  Lungs: No tachypnea, clear without wheezing, rales, or rhonchi  Cardiovascular: RRR, PMI not displaced,2/6 systolic murmur at the left sternal border, no gallops, bruit, thrill, or heave.  Abdomen: BS normal. Soft without organomegaly, masses, lesions or tenderness.  Extremities: without cyanosis, clubbing or edema. Good distal pulses bilateral  SKin: Warm, no lesions or rashes   Musculoskeletal: No deformities  Neuro: no focal signs  BP 134/71  Pulse 62  Ht 5\' 5"  (1.651 m)  Wt 185 lb (83.915 kg)  BMI 30.79 kg/m2   WJX:BJYNWG sinus rhythm normal EKG  2-D echo in June 2012Study Conclusions  - Left ventricle: The cavity size was normal. Wall thickness   was normal. Systolic function was normal. The estimated   ejection fraction was in the range of 60% to 65%. Wall   motion was normal; there were no regional wall motion   abnormalities.  Doppler parameters are consistent with   abnormal left ventricular relaxation (grade 1 diastolic   dysfunction). - Aortic valve: There was mild stenosis. Mild regurgitation.   Mean gradient: 11mm Hg (S). Peak gradient: 20mm Hg (S). - Mitral valve: Mild regurgitation. - Left atrium: The atrium was moderately dilated.

## 2012-01-20 ENCOUNTER — Ambulatory Visit (HOSPITAL_COMMUNITY): Payer: Medicare Other | Attending: Cardiology | Admitting: Radiology

## 2012-01-20 VITALS — Ht 65.0 in | Wt 184.0 lb

## 2012-01-20 DIAGNOSIS — R0609 Other forms of dyspnea: Secondary | ICD-10-CM | POA: Insufficient documentation

## 2012-01-20 DIAGNOSIS — I2510 Atherosclerotic heart disease of native coronary artery without angina pectoris: Secondary | ICD-10-CM | POA: Insufficient documentation

## 2012-01-20 DIAGNOSIS — R0989 Other specified symptoms and signs involving the circulatory and respiratory systems: Secondary | ICD-10-CM | POA: Diagnosis not present

## 2012-01-20 DIAGNOSIS — R079 Chest pain, unspecified: Secondary | ICD-10-CM | POA: Diagnosis not present

## 2012-01-20 DIAGNOSIS — R0602 Shortness of breath: Secondary | ICD-10-CM

## 2012-01-20 DIAGNOSIS — R0789 Other chest pain: Secondary | ICD-10-CM | POA: Diagnosis not present

## 2012-01-20 MED ORDER — TECHNETIUM TC 99M SESTAMIBI GENERIC - CARDIOLITE
11.0000 | Freq: Once | INTRAVENOUS | Status: AC | PRN
  Administered 2012-01-20: 11 via INTRAVENOUS

## 2012-01-20 MED ORDER — TECHNETIUM TC 99M SESTAMIBI GENERIC - CARDIOLITE
33.0000 | Freq: Once | INTRAVENOUS | Status: AC | PRN
  Administered 2012-01-20: 33 via INTRAVENOUS

## 2012-01-20 NOTE — Progress Notes (Signed)
[facility] SITE 3 NUCLEAR MED [address] [id number] [place] [address] [phone]  Cardiology Nuclear Med Study  Anne Martin is a 66 y.o. female     MRN : [id number]     DOB: 1945/12/19  Procedure Date: 01/20/2012  Nuclear Med Background Indication for Stress Test:  Evaluation for Ischemia and Stent Patency History:  '11 Heart Catheterization> Stent DX, residual moderate non-obstructive CAD, EF=not done, '12 Myocardial Perfusion Study-no ischemia, EF=85%, and 09-2011 Echo: EF=60-65%, mild AS  Cardiac Risk Factors: Carotid Disease, Family History - CAD and Lipids  Symptoms: Chest Tightness with/without exertion (last occurrence this am),  DOE, Fatigue, Fatigue with Exertion and SOB   Nuclear Pre-Procedure Caffeine/Decaff Intake:  None NPO After: 8:00am   Lungs:  clear O2 Sat: 95-97%% on room air. IV 0.9% NS with Angio Cath:  22g  IV Site: R Antecubital  IV Started by:  Bonnita Levan, RN  Chest Size (in):  36 Cup Size: C  Height: 5\' 5"  (1.651 m)  Weight:  184 lb (83.462 kg)  BMI:  Body mass index is 30.62 kg/(m^2). Tech Comments:  Patient held Toprol x 24 hrs    Nuclear Med Study 1 or 2 day study: 1 day  Stress Test Type:  Stress  Reading MD: Marca Ancona, MD  Order Authorizing Provider:  Olga Millers, MD  Resting Radionuclide: Technetium 24m Sestamibi  Resting Radionuclide Dose: 11.0 mCi   Stress Radionuclide:  Technetium 42m Sestamibi  Stress Radionuclide Dose: 33.0 mCi           Stress Protocol Rest HR: 65 Stress HR: 146  Rest BP: 130/71 Stress BP: 175/92  Exercise Time (min): 5:30 METS: 7.0   Predicted Max HR: 154 bpm % Max HR: 94.81 bpm Rate Pressure Product: 87564   Dose of Adenosine (mg):  n/a Dose of Lexiscan: n/a mg  Dose of Atropine (mg): n/a Dose of Dobutamine: n/a mcg/kg/min (at max HR)  Stress Test Technologist: Irean Hong, RN  Nuclear Technologist:  Domenic Polite, CNMT     Rest Procedure:  Myocardial perfusion  imaging was performed at rest 45 minutes following the intravenous administration of Technetium 22m Sestamibi. Rest ECG: NSR - Normal EKG  Stress Procedure:  The patient performed treadmill exercise using a Bruce  Protocol for 5 minutes and 30 seconds, RPE=17. The patient stopped due to DOE and complained of chest tightness, 5/10.  There were (+) ST-T wave changes with exercise. There was a rare PVC, and PAC.  Technetium 64m Sestamibi was injected at peak exercise and myocardial perfusion imaging was performed after a brief delay. EKG's and Preliminary images reviewed by Dr. Jens Som.The patient is pain free. Ok for the patient to leave per Dr Morton Stall keep follow up office visit with Dr Jens Som Stress ECG: Significant ST abnormalities consistent with ischemia.  QPS Raw Data Images:  Normal; no motion artifact; normal heart/lung ratio. Stress Images:  Normal homogeneous uptake in all areas of the myocardium. Rest Images:  Normal homogeneous uptake in all areas of the myocardium. Subtraction (SDS):  There is no evidence of scar or ischemia. Transient Ischemic Dilatation (Normal <1.22):  1.00 Lung/Heart Ratio (Normal <0.45):  0.37  Quantitative Gated Spect Images QGS EDV:  60 ml QGS ESV:  9 ml  Impression Exercise Capacity:  Fair exercise capacity. BP Response:  Normal blood pressure response. Clinical Symptoms:  Chest tightness. ECG Impression:  1 mm horizontal ST depression V5,V6.  Upsloping ST depression V4 and inferior leads.  Comparison  with Prior Nuclear Study: No significant change from previous study  Overall Impression:  Normal stress nuclear study.  There was chest tightness with exercise and there were ST changes consistent with ischemia.  However, the perfusion images were normal and TID ratio was normal.  Most likely this is a normal study with a false positive ECG.   LV Ejection Fraction: 85%.  LV Wall Motion:  NL LV Function; NL Wall Motion  Marca Ancona 01/20/2012

## 2012-01-24 ENCOUNTER — Encounter (INDEPENDENT_AMBULATORY_CARE_PROVIDER_SITE_OTHER): Payer: Medicare Other

## 2012-01-24 DIAGNOSIS — R0989 Other specified symptoms and signs involving the circulatory and respiratory systems: Secondary | ICD-10-CM | POA: Diagnosis not present

## 2012-01-27 ENCOUNTER — Telehealth: Payer: Self-pay | Admitting: Physician Assistant

## 2012-01-27 NOTE — Telephone Encounter (Signed)
Follow-up:    Patient returned [name]'s call about her carotid results.  Please call back.

## 2012-01-27 NOTE — Telephone Encounter (Signed)
Left message to call back  

## 2012-01-27 NOTE — Telephone Encounter (Signed)
Spoke with pt and reviewed carotid doppler results. 

## 2012-02-24 ENCOUNTER — Ambulatory Visit (INDEPENDENT_AMBULATORY_CARE_PROVIDER_SITE_OTHER): Payer: Medicare Other | Admitting: Cardiology

## 2012-02-24 ENCOUNTER — Encounter: Payer: Self-pay | Admitting: Cardiology

## 2012-02-24 VITALS — BP 120/73 | HR 61 | Ht 65.0 in | Wt 147.0 lb

## 2012-02-24 DIAGNOSIS — I359 Nonrheumatic aortic valve disorder, unspecified: Secondary | ICD-10-CM

## 2012-02-24 DIAGNOSIS — E785 Hyperlipidemia, unspecified: Secondary | ICD-10-CM

## 2012-02-24 DIAGNOSIS — I2510 Atherosclerotic heart disease of native coronary artery without angina pectoris: Secondary | ICD-10-CM

## 2012-02-24 DIAGNOSIS — I350 Nonrheumatic aortic (valve) stenosis: Secondary | ICD-10-CM

## 2012-02-24 DIAGNOSIS — R079 Chest pain, unspecified: Secondary | ICD-10-CM | POA: Diagnosis not present

## 2012-02-24 NOTE — Progress Notes (Signed)
HPI: Pleasant female I saw in May of 2011 with chest pain and an abnormal nuclear study. Cardiac catheterization was scheduled and performed in May of 2011. This revealed Normal LM, 60 LAD, 90 D2, 50-60 LCx, normal RCA, and nl LV function. Patient had PCI of the diagonal with DES at that time. Last echocardiogram in June of 2013 showed normal LV function, grade 1 diastolic dysfunction, mild aortic stenosis with a mean gradient of 11 mm of mercury, mild aortic insufficiency and mitral regurgitation and moderate left atrial enlargement. Carotid Dopplers in Oct 2013 were normal. Myoview in October of 2013 showed an ejection fraction of 85%. The images showed normal perfusion. There were ECG changes. Note the above studies were performed because of chest pain with exertion, after exertion and with bending over. Since I last saw her, the patient denies any dyspnea on exertion, orthopnea, PND, pedal edema, palpitations, syncope or chest pain.   Current Outpatient Prescriptions  Medication Sig Dispense Refill  . aspirin 81 MG tablet Take 81 mg by mouth daily.        Marland Kitchen atorvastatin (LIPITOR) 40 MG tablet TAKE 1 TABLET BY MOUTH DAILY  90 tablet  0  . Calcium Carbonate (CALCIUM 600) 1500 MG TABS Take 1 tablet by mouth once.       . cholecalciferol (VITAMIN D) 1000 UNITS tablet Take 1,000 Units by mouth daily.      Marland Kitchen estradiol (VAGIFEM) 25 MCG vaginal tablet Place 25 mcg vaginally daily. Twice weekly      . fluticasone (FLONASE) 50 MCG/ACT nasal spray Place 2 sprays into the nose daily. AS NEEDED      . Loratadine (CLARITIN) 10 MG CAPS Take 1 capsule by mouth as needed. 24 hr tabs      . metoprolol succinate (TOPROL-XL) 25 MG 24 hr tablet Take 1 tablet (25 mg total) by mouth daily.  90 tablet  2  . nitroGLYCERIN (NITROSTAT) 0.4 MG SL tablet Place 1 tablet (0.4 mg total) under the tongue every 5 (five) minutes as needed.  50 tablet  2     Past Medical History  Diagnosis Date  . Heart murmur     mild  aortic insufficiency per echo in May of 2011, mildly elevated LVOT  . H/O: rheumatic fever   . Osteopenia   . CAD (coronary artery disease)     s/p cath after abnormal nuclear study; normal left main, 60% LAD, 90% 2nd DX, 50 -60% LCX and normal RCA with normal LV function; s/p PCI of the diagonal with DES; normal myoview in May 2012  . Hyperlipidemia   . Gallstones   . Allergy     seasonal  . History of colonoscopy   . Carotid disease, bilateral     0-39% per doppler in July of 2011  . Normal nuclear stress test May 2012    normal EF and normal perfusion.     Past Surgical History  Procedure Date  . Knee arthroscopy     Rt. Knee  . Cholecystectomy nov 2004  . Vaginal hysterectomy 1994  . Angioplasty may 2011    stents  . Tonsillectomy   . Colonoscopy     History   Social History  . Marital Status: Married    Spouse Name: N/A    Number of Children: 2  . Years of Education: N/A   Occupational History  . retired Runner, broadcasting/film/video   .     Social History Main Topics  . Smoking status: Former Smoker  Types: Cigarettes    Quit date: 04/12/1977  . Smokeless tobacco: Never Used     Comment: quit age 19  . Alcohol Use: No  . Drug Use: No  . Sexually Active: Not Currently   Other Topics Concern  . Not on file   Social History Narrative  . No narrative on file    ROS: no fevers or chills, productive cough, hemoptysis, dysphasia, odynophagia, melena, hematochezia, dysuria, hematuria, rash, seizure activity, orthopnea, PND, pedal edema, claudication. Remaining systems are negative.  Physical Exam: Well-developed well-nourished in no acute distress.  Skin is warm and dry.  HEENT is normal.  Neck is supple.  Chest is clear to auscultation with normal expansion.  Cardiovascular exam is regular rate and rhythm. 2/6 systolic murmur left sternal border Abdominal exam nontender or distended. No masses palpated. Extremities show no edema. neuro grossly intact

## 2012-02-24 NOTE — Assessment & Plan Note (Signed)
Patient symptoms have resolved. She is now exerting herself with no symptoms. Recent functional study negative. Plan continue medical therapy. If she has recurrent symptoms will have low threshold for cardiac catheterization.

## 2012-02-24 NOTE — Patient Instructions (Addendum)
Your physician wants you to follow-up in: 6 MONTHS WITH DR CRENSHAW You will receive a reminder letter in the mail two months in advance. If you don't receive a letter, please call our office to schedule the follow-up appointment.  

## 2012-02-24 NOTE — Assessment & Plan Note (Signed)
Continue statin. 

## 2012-02-24 NOTE — Assessment & Plan Note (Signed)
Mild on most recent echocardiogram. We'll plan followup studies in the future. 

## 2012-02-24 NOTE — Assessment & Plan Note (Signed)
Continue aspirin and statin. 

## 2012-03-07 ENCOUNTER — Other Ambulatory Visit: Payer: Self-pay | Admitting: Cardiology

## 2012-06-01 ENCOUNTER — Other Ambulatory Visit: Payer: Self-pay | Admitting: Cardiology

## 2012-06-03 ENCOUNTER — Other Ambulatory Visit: Payer: Self-pay | Admitting: Cardiology

## 2012-07-18 ENCOUNTER — Ambulatory Visit: Payer: Medicare Other | Admitting: Cardiology

## 2012-07-18 DIAGNOSIS — D239 Other benign neoplasm of skin, unspecified: Secondary | ICD-10-CM | POA: Diagnosis not present

## 2012-07-18 DIAGNOSIS — L821 Other seborrheic keratosis: Secondary | ICD-10-CM | POA: Diagnosis not present

## 2012-08-07 ENCOUNTER — Ambulatory Visit (INDEPENDENT_AMBULATORY_CARE_PROVIDER_SITE_OTHER): Payer: Medicare Other | Admitting: Cardiology

## 2012-08-07 ENCOUNTER — Encounter: Payer: Self-pay | Admitting: Cardiology

## 2012-08-07 VITALS — BP 130/60 | HR 57 | Ht 65.0 in | Wt 180.1 lb

## 2012-08-07 DIAGNOSIS — I359 Nonrheumatic aortic valve disorder, unspecified: Secondary | ICD-10-CM | POA: Diagnosis not present

## 2012-08-07 DIAGNOSIS — I350 Nonrheumatic aortic (valve) stenosis: Secondary | ICD-10-CM

## 2012-08-07 DIAGNOSIS — I2510 Atherosclerotic heart disease of native coronary artery without angina pectoris: Secondary | ICD-10-CM | POA: Diagnosis not present

## 2012-08-07 DIAGNOSIS — E785 Hyperlipidemia, unspecified: Secondary | ICD-10-CM

## 2012-08-07 DIAGNOSIS — R079 Chest pain, unspecified: Secondary | ICD-10-CM | POA: Diagnosis not present

## 2012-08-07 DIAGNOSIS — E7800 Pure hypercholesterolemia, unspecified: Secondary | ICD-10-CM | POA: Diagnosis not present

## 2012-08-07 LAB — LIPID PANEL
Cholesterol: 101 mg/dL (ref 0–200)
HDL: 53.8 mg/dL (ref >39.00)
LDL Cholesterol: 36 mg/dL (ref 0–99)
Total CHOL/HDL Ratio: 2
Triglycerides: 58 mg/dL (ref 0.0–149.0)
VLDL: 11.6 mg/dL (ref 0.0–40.0)

## 2012-08-07 LAB — HEPATIC FUNCTION PANEL
ALT: 34 U/L (ref 0–35)
AST: 34 U/L (ref 0–37)
Albumin: 4.1 g/dL (ref 3.5–5.2)
Alkaline Phosphatase: 130 U/L — ABNORMAL HIGH (ref 39–117)
Bilirubin, Direct: 0.1 mg/dL (ref 0.0–0.3)
Total Bilirubin: 1.1 mg/dL (ref 0.3–1.2)
Total Protein: 7.2 g/dL (ref 6.0–8.3)

## 2012-08-07 NOTE — Progress Notes (Signed)
HPI: Pleasant female I initially saw in May of 2011 with chest pain and an abnormal nuclear study. Cardiac catheterization was scheduled and performed in May of 2011. This revealed Normal LM, 60 LAD, 90 D2, 50-60 LCx, normal RCA, and nl LV function. Patient had PCI of the diagonal with DES at that time. Last echocardiogram in June of 2013 showed normal LV function, grade 1 diastolic dysfunction, mild aortic stenosis with a mean gradient of 11 mm of mercury, mild aortic insufficiency and mitral regurgitation and moderate left atrial enlargement. Carotid Dopplers in Oct 2013 were normal. Myoview in October of 2013 showed an ejection fraction of 85%. The images showed normal perfusion. There were ECG changes. I last saw her in Nov 2013. Since then, she has noticed some chest tightness. It is substernal with diffuse radiation to her back. No associated nausea, vomiting or dyspnea. No diaphoresis. The pain is not pleuritic or positional. She notices it with bending over and occasionally at rest. She does not have it with exercise. It resolved spontaneously.   Current Outpatient Prescriptions  Medication Sig Dispense Refill  . aspirin 81 MG tablet Take 81 mg by mouth daily.        Marland Kitchen atorvastatin (LIPITOR) 40 MG tablet TAKE 1 TABLET BY MOUTH EVERY DAY  90 tablet  0  . Calcium Carbonate (CALCIUM 600) 1500 MG TABS Take 1 tablet by mouth once.       . cholecalciferol (VITAMIN D) 1000 UNITS tablet Take 1,000 Units by mouth daily.      Marland Kitchen estradiol (VAGIFEM) 25 MCG vaginal tablet Place 25 mcg vaginally daily. Twice weekly      . fluticasone (FLONASE) 50 MCG/ACT nasal spray Place 2 sprays into the nose daily. AS NEEDED      . Loratadine (CLARITIN) 10 MG CAPS Take 1 capsule by mouth as needed. 24 hr tabs      . metoprolol succinate (TOPROL-XL) 25 MG 24 hr tablet TAKE 1 TABLET BY MOUTH DAILY.  90 tablet  2  . nitroGLYCERIN (NITROSTAT) 0.4 MG SL tablet Place 1 tablet (0.4 mg total) under the tongue every 5 (five)  minutes as needed.  50 tablet  2   No current facility-administered medications for this visit.     Past Medical History  Diagnosis Date  . Heart murmur     mild aortic insufficiency per echo in May of 2011, mildly elevated LVOT  . H/O: rheumatic fever   . Osteopenia   . CAD (coronary artery disease)     s/p cath after abnormal nuclear study; normal left main, 60% LAD, 90% 2nd DX, 50 -60% LCX and normal RCA with normal LV function; s/p PCI of the diagonal with DES; normal myoview in May 2012  . Hyperlipidemia   . Gallstones   . Allergy     seasonal  . History of colonoscopy   . Carotid disease, bilateral     0-39% per doppler in July of 2011  . Normal nuclear stress test May 2012    normal EF and normal perfusion.     Past Surgical History  Procedure Laterality Date  . Knee arthroscopy      Rt. Knee  . Cholecystectomy  nov 2004  . Vaginal hysterectomy  1994  . Angioplasty  may 2011    stents  . Tonsillectomy    . Colonoscopy      History   Social History  . Marital Status: Married    Spouse Name: N/A  Number of Children: 2  . Years of Education: N/A   Occupational History  . retired Runner, broadcasting/film/video   .     Social History Main Topics  . Smoking status: Former Smoker    Types: Cigarettes    Quit date: 04/12/1977  . Smokeless tobacco: Never Used     Comment: quit age 36  . Alcohol Use: No  . Drug Use: No  . Sexually Active: Not Currently   Other Topics Concern  . Not on file   Social History Narrative  . No narrative on file    ROS: some numbness hands and feet but no fevers or chills, productive cough, hemoptysis, dysphasia, odynophagia, melena, hematochezia, dysuria, hematuria, rash, seizure activity, orthopnea, PND, pedal edema, claudication. Remaining systems are negative.  Physical Exam: Well-developed well-nourished in no acute distress.  Skin is warm and dry.  HEENT is normal.  Neck is supple.  Chest is clear to auscultation with normal  expansion.  Cardiovascular exam is regular rate and rhythm, 2/6 systolic murmur left sternal border.  Abdominal exam nontender or distended. No masses palpated. Extremities show no edema. neuro grossly intact  ECG sinus bradycardia with no ST changes.

## 2012-08-07 NOTE — Assessment & Plan Note (Signed)
Plan repeat echocardiogram in approximately one year. 

## 2012-08-07 NOTE — Assessment & Plan Note (Signed)
Continue aspirin and statin. 

## 2012-08-07 NOTE — Assessment & Plan Note (Signed)
Continue statin. Check lipids and liver. 

## 2012-08-07 NOTE — Patient Instructions (Addendum)
Your physician recommends that you schedule a follow-up appointment in: 8 WEEKS WITH DR CRENSHAW  

## 2012-08-07 NOTE — Assessment & Plan Note (Signed)
Symptoms are somewhat atypical. They occur with bending over and at rest that did not occur with activities. She walks on the treadmill with no symptoms. Her electrocardiogram is normal. Recent functional study negative. We discussed the options today including repeat treadmill versus cardiac catheterization. She would like to continue with conservative management at this point. If her symptoms persist we will pursue further evaluation with either a functional study or catheterization. I will see her back in 8 weeks to make sure that she is stable.

## 2012-08-08 ENCOUNTER — Encounter: Payer: Self-pay | Admitting: *Deleted

## 2012-09-08 DIAGNOSIS — H2510 Age-related nuclear cataract, unspecified eye: Secondary | ICD-10-CM | POA: Diagnosis not present

## 2012-09-19 ENCOUNTER — Other Ambulatory Visit: Payer: Self-pay | Admitting: Cardiology

## 2012-10-03 ENCOUNTER — Encounter: Payer: Self-pay | Admitting: Cardiology

## 2012-10-03 ENCOUNTER — Ambulatory Visit (INDEPENDENT_AMBULATORY_CARE_PROVIDER_SITE_OTHER): Payer: Medicare Other | Admitting: Cardiology

## 2012-10-03 VITALS — BP 130/60 | HR 60 | Ht 65.0 in | Wt 179.4 lb

## 2012-10-03 DIAGNOSIS — I2510 Atherosclerotic heart disease of native coronary artery without angina pectoris: Secondary | ICD-10-CM

## 2012-10-03 DIAGNOSIS — R079 Chest pain, unspecified: Secondary | ICD-10-CM

## 2012-10-03 DIAGNOSIS — I359 Nonrheumatic aortic valve disorder, unspecified: Secondary | ICD-10-CM | POA: Diagnosis not present

## 2012-10-03 DIAGNOSIS — E785 Hyperlipidemia, unspecified: Secondary | ICD-10-CM | POA: Diagnosis not present

## 2012-10-03 DIAGNOSIS — I350 Nonrheumatic aortic (valve) stenosis: Secondary | ICD-10-CM

## 2012-10-03 NOTE — Patient Instructions (Addendum)
Your physician wants you to follow-up in: 6 MONTHS WITH DR CRENSHAW You will receive a reminder letter in the mail two months in advance. If you don't receive a letter, please call our office to schedule the follow-up appointment.  

## 2012-10-03 NOTE — Assessment & Plan Note (Signed)
Symptoms have resolved. Continue medical therapy.

## 2012-10-03 NOTE — Assessment & Plan Note (Signed)
Continue aspirin and statin. 

## 2012-10-03 NOTE — Assessment & Plan Note (Signed)
Plan repeat echocardiogram in approximately one year.

## 2012-10-03 NOTE — Progress Notes (Signed)
HPI: Pleasant female for fu of CAD. Cardiac catheterization was performed in May of 2011. This revealed Normal LM, 60 LAD, 90 D2, 50-60 LCx, normal RCA, and nl LV function. Patient had PCI of the diagonal with DES at that time. Last echocardiogram in June of 2013 showed normal LV function, grade 1 diastolic dysfunction, mild aortic stenosis with a mean gradient of 11 mm of mercury, mild aortic insufficiency and mitral regurgitation and moderate left atrial enlargement. Carotid Dopplers in Oct 2013 were normal. Myoview in October of 2013 showed an ejection fraction of 85%. The images showed normal perfusion. There were ECG changes. I last saw her in April of 2014. At that time she was complaining of atypical chest pain. We discussed the options and she preferred observation. We felt we would consider repeat functional study versus catheterization if symptoms persisted. Since last saw her, the patient has dyspnea with more extreme activities but not with routine activities. It is relieved with rest. It is not associated with chest pain. There is no orthopnea, PND or pedal edema. There is no syncope or palpitations. There is no exertional chest pain.   Current Outpatient Prescriptions  Medication Sig Dispense Refill  . aspirin 81 MG tablet Take 81 mg by mouth daily.        Marland Kitchen atorvastatin (LIPITOR) 40 MG tablet TAKE 1 TABLET BY MOUTH DAILY  90 tablet  0  . Calcium Carbonate (CALCIUM 600) 1500 MG TABS Take 1 tablet by mouth once.       . cholecalciferol (VITAMIN D) 1000 UNITS tablet Take 1,000 Units by mouth daily.      Marland Kitchen estradiol (VAGIFEM) 25 MCG vaginal tablet Place 25 mcg vaginally daily. Twice weekly      . fluticasone (FLONASE) 50 MCG/ACT nasal spray Place 2 sprays into the nose daily. AS NEEDED      . Loratadine (CLARITIN) 10 MG CAPS Take 1 capsule by mouth as needed. 24 hr tabs      . metoprolol succinate (TOPROL-XL) 25 MG 24 hr tablet TAKE 1 TABLET BY MOUTH DAILY.  90 tablet  2  . nitroGLYCERIN  (NITROSTAT) 0.4 MG SL tablet Place 1 tablet (0.4 mg total) under the tongue every 5 (five) minutes as needed.  50 tablet  2  . atorvastatin (LIPITOR) 40 MG tablet TAKE 1 TABLET BY MOUTH EVERY DAY  90 tablet  0   No current facility-administered medications for this visit.     Past Medical History  Diagnosis Date  . Heart murmur     mild aortic insufficiency per echo in May of 2011, mildly elevated LVOT  . H/O: rheumatic fever   . Osteopenia   . CAD (coronary artery disease)     s/p cath after abnormal nuclear study; normal left main, 60% LAD, 90% 2nd DX, 50 -60% LCX and normal RCA with normal LV function; s/p PCI of the diagonal with DES; normal myoview in May 2012  . Hyperlipidemia   . Gallstones   . Allergy     seasonal  . History of colonoscopy   . Carotid disease, bilateral     0-39% per doppler in July of 2011  . Normal nuclear stress test May 2012    normal EF and normal perfusion.     Past Surgical History  Procedure Laterality Date  . Knee arthroscopy      Rt. Knee  . Cholecystectomy  nov 2004  . Vaginal hysterectomy  1994  . Angioplasty  may 2011  stents  . Tonsillectomy    . Colonoscopy      History   Social History  . Marital Status: Married    Spouse Name: N/A    Number of Children: 2  . Years of Education: N/A   Occupational History  . retired Runner, broadcasting/film/video   .     Social History Main Topics  . Smoking status: Former Smoker    Types: Cigarettes    Quit date: 04/12/1977  . Smokeless tobacco: Never Used     Comment: quit age 65  . Alcohol Use: No  . Drug Use: No  . Sexually Active: Not Currently   Other Topics Concern  . Not on file   Social History Narrative  . No narrative on file    ROS: no fevers or chills, productive cough, hemoptysis, dysphasia, odynophagia, melena, hematochezia, dysuria, hematuria, rash, seizure activity, orthopnea, PND, pedal edema, claudication. Remaining systems are negative.  Physical Exam: Well-developed  well-nourished in no acute distress.  Skin is warm and dry.  HEENT is normal.  Neck is supple.  Chest is clear to auscultation with normal expansion.  Cardiovascular exam is regular rate and rhythm.  Abdominal exam nontender or distended. No masses palpated. Extremities show no edema. neuro grossly intact

## 2012-10-03 NOTE — Assessment & Plan Note (Signed)
Continue statin. 

## 2012-10-10 ENCOUNTER — Other Ambulatory Visit: Payer: Self-pay

## 2012-10-10 DIAGNOSIS — Z1231 Encounter for screening mammogram for malignant neoplasm of breast: Secondary | ICD-10-CM

## 2012-11-01 DIAGNOSIS — J029 Acute pharyngitis, unspecified: Secondary | ICD-10-CM | POA: Diagnosis not present

## 2012-11-14 ENCOUNTER — Ambulatory Visit
Admission: RE | Admit: 2012-11-14 | Discharge: 2012-11-14 | Disposition: A | Discharge status: Home / Self Care | Payer: Medicare Other | Source: Ambulatory Visit

## 2012-11-14 DIAGNOSIS — Z1231 Encounter for screening mammogram for malignant neoplasm of breast: Secondary | ICD-10-CM

## 2012-11-14 DIAGNOSIS — Z124 Encounter for screening for malignant neoplasm of cervix: Secondary | ICD-10-CM | POA: Diagnosis not present

## 2012-11-14 DIAGNOSIS — Z01419 Encounter for gynecological examination (general) (routine) without abnormal findings: Secondary | ICD-10-CM | POA: Diagnosis not present

## 2012-11-14 DIAGNOSIS — N952 Postmenopausal atrophic vaginitis: Secondary | ICD-10-CM | POA: Diagnosis not present

## 2012-11-14 DIAGNOSIS — E669 Obesity, unspecified: Secondary | ICD-10-CM | POA: Diagnosis not present

## 2012-12-04 ENCOUNTER — Ambulatory Visit (INDEPENDENT_AMBULATORY_CARE_PROVIDER_SITE_OTHER): Payer: Medicare Other | Admitting: Family Medicine

## 2012-12-04 ENCOUNTER — Telehealth: Payer: Self-pay | Admitting: Cardiology

## 2012-12-04 ENCOUNTER — Encounter: Payer: Self-pay | Admitting: Family Medicine

## 2012-12-04 VITALS — BP 132/78 | HR 64 | Temp 97.7°F | Wt 185.0 lb

## 2012-12-04 DIAGNOSIS — R202 Paresthesia of skin: Secondary | ICD-10-CM

## 2012-12-04 DIAGNOSIS — R209 Unspecified disturbances of skin sensation: Secondary | ICD-10-CM | POA: Diagnosis not present

## 2012-12-04 NOTE — Telephone Encounter (Signed)
Dr. Myrtis Ser DOD aware of pt's symptoms, and recommends for pt to make an appointment with Dr. Jens Som or A extender when appointments are available. Pt is aware that Dr Jens Som next opening is on September 17 th and the PA is on September 10 th. Pt states will call her PCP to be seen today. Pt is aware to call the office as needed.

## 2012-12-04 NOTE — Patient Instructions (Addendum)
Follow up for any recurrent paresthesias.

## 2012-12-04 NOTE — Progress Notes (Signed)
Subjective:    Patient ID: Anne Martin, female    DOB: Dec 21, 1945, 67 y.o.   MRN: [id number]  HPI Patient is seen for an acute visit. Past medical history significant for CAD, hyperlipidemia, and osteopenia Over the weekend she will some lightheadedness and sensation of "pins and needles" hands and feet intermittently She denies chest pains. She did work outside a lot but has never had any problems with heat intolerance. She denies any headaches. She felt slightly lightheaded with standing but hydrating well. Feels well today. No recent dyspnea. Medications reviewed and compliant with all.  Denied any nausea, vomiting, abdominal pain, chest pains, arthralgias, or myalgias.  Past Medical History  Diagnosis Date  . Heart murmur     mild aortic insufficiency per echo in May of 2011, mildly elevated LVOT  . H/O: rheumatic fever   . Osteopenia   . CAD (coronary artery disease)     s/p cath after abnormal nuclear study; normal left main, 60% LAD, 90% 2nd DX, 50 -60% LCX and normal RCA with normal LV function; s/p PCI of the diagonal with DES; normal myoview in May 2012  . Hyperlipidemia   . Gallstones   . Allergy     seasonal  . History of colonoscopy   . Carotid disease, bilateral     0-39% per doppler in July of 2011  . Normal nuclear stress test May 2012    normal EF and normal perfusion.    Past Surgical History  Procedure Laterality Date  . Knee arthroscopy      Rt. Knee  . Cholecystectomy  nov 2004  . Vaginal hysterectomy  1994  . Angioplasty  may 2011    stents  . Tonsillectomy    . Colonoscopy      reports that she quit smoking about 35 years ago. Her smoking use included Cigarettes. She smoked 0.00 packs per day. She has never used smokeless tobacco. She reports that she does not drink alcohol or use illicit drugs. family history includes Colon cancer in an other family member; Heart disease in her brother, father, and mother; Ulcerative colitis in her  daughter. Allergies  Allergen Reactions  . Procaine Hcl       Review of Systems  Constitutional: Positive for fatigue. Negative for fever and chills.  Respiratory: Negative for cough, shortness of breath and wheezing.   Cardiovascular: Negative for chest pain, palpitations and leg swelling.  Gastrointestinal: Negative for nausea, vomiting and abdominal pain.  Endocrine: Negative for polydipsia and polyuria.  Genitourinary: Negative for dysuria.  Hematological: Negative for adenopathy.       Objective:   Physical Exam  Constitutional: She is oriented to person, place, and time. She appears well-developed and well-nourished.  Neck: Neck supple. No thyromegaly present.  Cardiovascular: Normal rate and regular rhythm.   Murmur heard. Systolic ejection murmur upper sternal border right side  Pulmonary/Chest: Effort normal and breath sounds normal. No respiratory distress. She has no wheezes. She has no rales.  Musculoskeletal: She exhibits no edema.  Lymphadenopathy:    She has no cervical adenopathy.  Neurological: She is alert and oriented to person, place, and time. No cranial nerve deficit.  Full-strength throughout with symmetric reflexes  Skin: No rash noted.  Psychiatric: She has a normal mood and affect. Her behavior is normal.          Assessment & Plan:  Paresthesias. These are her bilateral upper and lower extremities and intermittent. Uncertain etiology. Check labs with TSH, basic metabolic  panel, sedimentation rate, B12. She has nonfocal exam and symptoms are actually improved at this time. Observation if the above labs are normal

## 2012-12-04 NOTE — Telephone Encounter (Signed)
New Prob  Pt states is not feeling well and would like to be seen today. She did not go into detail as to what was wrong.

## 2012-12-04 NOTE — Telephone Encounter (Signed)
Pt called today because since Saturday pt has some crazies symptoms: Numbness on arms hands and feet. Also feels a belt goes around her chest at times,also pt  gets dizzy when getting up from a sitting position. Pt states she had numbness symptoms before but not this severe, at that time Pt's  PCP send her to a neurologist to see if she has neuropathy, but nothing was found. Pt has a history of stent placement , and she is afraid that these symptoms are related to her heart.

## 2012-12-05 ENCOUNTER — Other Ambulatory Visit (INDEPENDENT_AMBULATORY_CARE_PROVIDER_SITE_OTHER): Payer: Medicare Other

## 2012-12-05 DIAGNOSIS — R209 Unspecified disturbances of skin sensation: Secondary | ICD-10-CM

## 2012-12-05 DIAGNOSIS — R202 Paresthesia of skin: Secondary | ICD-10-CM

## 2012-12-05 LAB — BASIC METABOLIC PANEL
BUN: 16 mg/dL (ref 6–23)
CO2: 29 mEq/L (ref 19–32)
Calcium: 9.8 mg/dL (ref 8.4–10.5)
Chloride: 104 mEq/L (ref 96–112)
Creatinine, Ser: 0.8 mg/dL (ref 0.4–1.2)
GFR: 74.78 mL/min (ref >60.00)
Glucose, Bld: 79 mg/dL (ref 70–99)
Potassium: 4.1 mEq/L (ref 3.5–5.1)
Sodium: 139 mEq/L (ref 135–145)

## 2012-12-05 LAB — VITAMIN B12: Vitamin B-12: 362 pg/mL (ref 211–911)

## 2012-12-05 LAB — SEDIMENTATION RATE: Sed Rate: 10 mm/hr (ref 0–22)

## 2012-12-05 LAB — TSH: TSH: 1.86 u[IU]/mL (ref 0.35–5.50)

## 2012-12-16 ENCOUNTER — Other Ambulatory Visit: Payer: Self-pay | Admitting: Cardiology

## 2012-12-28 ENCOUNTER — Encounter: Payer: Self-pay | Admitting: Internal Medicine

## 2012-12-28 ENCOUNTER — Ambulatory Visit (INDEPENDENT_AMBULATORY_CARE_PROVIDER_SITE_OTHER): Payer: Medicare Other | Admitting: Internal Medicine

## 2012-12-28 VITALS — BP 114/70 | HR 67 | Temp 98.0°F | Wt 183.0 lb

## 2012-12-28 DIAGNOSIS — L299 Pruritus, unspecified: Secondary | ICD-10-CM | POA: Diagnosis not present

## 2012-12-28 DIAGNOSIS — L259 Unspecified contact dermatitis, unspecified cause: Secondary | ICD-10-CM | POA: Diagnosis not present

## 2012-12-28 DIAGNOSIS — L309 Dermatitis, unspecified: Secondary | ICD-10-CM

## 2012-12-28 MED ORDER — PREDNISONE 10 MG PO TABS: ORAL_TABLET | ORAL | Status: DC

## 2012-12-28 MED ORDER — DOXYCYCLINE HYCLATE 100 MG PO CAPS: 100.0000 mg | ORAL_CAPSULE | Freq: Two times a day (BID) | ORAL | Status: DC

## 2012-12-28 NOTE — Progress Notes (Signed)
Chief Complaint  Patient presents with  . Rash  . Bleeding/Bruising    HPI: Patient comes in today for SDA for  new problem evaluation. pcp NA Onset about a week ago of each she then tender redness area under her left breast near an area where she had a cyst removed surgically by a dermatologist. Thought it was a bug bite that has persisted is a bit tender with no drainage but then developed severe he itchy rash left groin with papules with some bruising near by and no pain itching almost all over on the palms left shoulder a few other papules no one else is itching or has a rash  He does work outside and was treated for some poison ivy or contact dermatitis in the remote past. No specific treatment except for Polysporin on the left chest area wonders if she could have shingles poison ivy or something else.  No fevers other systemic symptoms. No new soaps or medications. He has been working in the chart recently. ROS: See pertinent positives and negatives per HPI.  Past Medical History  Diagnosis Date  . Heart murmur     mild aortic insufficiency per echo in May of 2011, mildly elevated LVOT  . H/O: rheumatic fever   . Osteopenia   . CAD (coronary artery disease)     s/p cath after abnormal nuclear study; normal left main, 60% LAD, 90% 2nd DX, 50 -60% LCX and normal RCA with normal LV function; s/p PCI of the diagonal with DES; normal myoview in May 2012  . Hyperlipidemia   . Gallstones   . Allergy     seasonal  . History of colonoscopy   . Carotid disease, bilateral     0-39% per doppler in July of 2011  . Normal nuclear stress test May 2012    normal EF and normal perfusion.     Family History  Problem Relation Age of Onset  . Heart disease Mother   . Heart disease Father     CABG  . Heart disease Brother     2 stents  . Colon cancer      neg. hx.  . Ulcerative colitis Daughter     History   Social History  . Marital Status: Married    Spouse Name: N/A   Number of Children: 2  . Years of Education: N/A   Occupational History  . retired Runner, broadcasting/film/video   .     Social History Main Topics  . Smoking status: Former Smoker    Types: Cigarettes    Quit date: 04/12/1977  . Smokeless tobacco: Never Used     Comment: quit age 89  . Alcohol Use: No  . Drug Use: No  . Sexual Activity: Not Currently   Other Topics Concern  . None   Social History Narrative  . None    Outpatient Encounter Prescriptions as of 12/28/2012  Medication Sig Dispense Refill  . aspirin 81 MG tablet Take 81 mg by mouth daily.        Marland Kitchen atorvastatin (LIPITOR) 40 MG tablet TAKE 1 TABLET BY MOUTH DAILY  90 tablet  0  . Calcium Carbonate (CALCIUM 600) 1500 MG TABS Take 1 tablet by mouth once.       . cholecalciferol (VITAMIN D) 1000 UNITS tablet Take 1,000 Units by mouth daily.      Marland Kitchen estradiol (VAGIFEM) 25 MCG vaginal tablet Place 25 mcg vaginally daily. Twice weekly      . fluticasone (FLONASE)  50 MCG/ACT nasal spray Place 2 sprays into the nose daily. AS NEEDED      . Loratadine (CLARITIN) 10 MG CAPS Take 1 capsule by mouth as needed. 24 hr tabs      . metoprolol succinate (TOPROL-XL) 25 MG 24 hr tablet TAKE 1 TABLET BY MOUTH DAILY.  90 tablet  2  . nitroGLYCERIN (NITROSTAT) 0.4 MG SL tablet Place 1 tablet (0.4 mg total) under the tongue every 5 (five) minutes as needed.  50 tablet  2  . doxycycline (VIBRAMYCIN) 100 MG capsule Take 1 capsule (100 mg total) by mouth 2 (two) times daily.  14 capsule  0  . predniSONE (DELTASONE) 10 MG tablet Take pills per day,6,6,6,4,4,4,2,2,2,1,1,1  40 tablet  0   No facility-administered encounter medications on file as of 12/28/2012.    EXAM:  BP 114/70  Pulse 67  Temp(Src) 98 F (36.7 C) (Oral)  Wt 183 lb (83.008 kg)  BMI 30.45 kg/m2  SpO2 97%  Body mass index is 30.45 kg/(m^2).  GENERAL: vitals reviewed and listed above, alert, oriented, appears well hydrated and in no acute distress HEENT: atraumatic, conjunctiva  clear, no  obvious abnormalities on inspection of external nose and ears OP : no lesion edema or exudate  NECK: no obvious masses on inspection palpation  Skin shows normal capillary refill and color. A number of different rashes under the left breast medially there is a patch of redness that is palpable without vesicles somewhat tender and indurated but no abscess in acute intertriginous area but no satellite lesions.  Left inguinal area at 10 teen lying very. Discrete papules that look like bug bites without edema or other redness superior to that there is some subcutaneous ecchymoses. There is no adenopathy Left shoulder small patch of what looks like a linear contact dermatitis and a few other papules on the right neck Hands have no rash look a little painting she is scratching them no edema MS: moves all extremities without noticeable focal  abnormality  PSYCH: pleasant and cooperative, no obvious depression or anxiety  ASSESSMENT AND PLAN:  Discussed the following assessment and plan:  Dermatitis  Itching She appears to have 3 different rash types none of them look typically like shingles although the one under the breast could develop into.suspect not at this time. Because of the tenderness there is a risk of secondary bacterial infection antibiotic prescription given in case over the weekend but can call the on-call service or be seen in the Saturday clinic if unsure whether to start this medication. Otherwise systemic treatment with prednisone because of the extensive symptoms .   Some locally bug bites and some look like contact dermatitis. I don't think she has active infection. Stomach prednisone observe for secondary infection yeast etc. Follow up if persistent and progressive. Noted to shingles vaccine until 2 weeks off of the prednisone.  Instructed not to take the doxycycline unless bacterial infection suspected. -Patient advised to return or notify health care team  if symptoms worsen or  persist or new concerns arise.  Patient Instructions  Shingles usually is not all over where your rash is  Some of this looks like a contact dermatitis   And bug bites The  Area on the breast could be early  Infection   Sometimes yeast can also happen.   Contact us if   Fever or  Rash becomes worse or painful  If the area near the breast   In getting  infected looking then  Can add antibiotic after calling the office or on call service .       Neta Mends. Raegyn Renda M.D.

## 2012-12-28 NOTE — Patient Instructions (Signed)
Shingles usually is not all over where your rash is  Some of this looks like a contact dermatitis   And bug bites The  Area on the breast could be early  Infection   Sometimes yeast can also happen.   Contact us if   Fever or  Rash becomes worse or painful  If the area near the breast   In getting  infected looking then  Can add antibiotic after calling the office or on call service .

## 2013-01-18 DIAGNOSIS — Z23 Encounter for immunization: Secondary | ICD-10-CM | POA: Diagnosis not present

## 2013-03-13 ENCOUNTER — Encounter: Payer: Self-pay | Admitting: Cardiology

## 2013-03-13 ENCOUNTER — Ambulatory Visit (INDEPENDENT_AMBULATORY_CARE_PROVIDER_SITE_OTHER): Payer: Medicare Other | Admitting: Cardiology

## 2013-03-13 VITALS — BP 122/68 | HR 67 | Ht 65.0 in | Wt 180.0 lb

## 2013-03-13 DIAGNOSIS — I359 Nonrheumatic aortic valve disorder, unspecified: Secondary | ICD-10-CM | POA: Diagnosis not present

## 2013-03-13 DIAGNOSIS — I2510 Atherosclerotic heart disease of native coronary artery without angina pectoris: Secondary | ICD-10-CM

## 2013-03-13 DIAGNOSIS — I350 Nonrheumatic aortic (valve) stenosis: Secondary | ICD-10-CM

## 2013-03-13 LAB — LIPID PANEL
Cholesterol: 132 mg/dL (ref 0–200)
HDL: 65.4 mg/dL (ref >39.00)
LDL Cholesterol: 54 mg/dL (ref 0–99)
Total CHOL/HDL Ratio: 2
Triglycerides: 62 mg/dL (ref 0.0–149.0)
VLDL: 12.4 mg/dL (ref 0.0–40.0)

## 2013-03-13 LAB — HEPATIC FUNCTION PANEL
ALT: 31 U/L (ref 0–35)
AST: 37 U/L (ref 0–37)
Albumin: 4.5 g/dL (ref 3.5–5.2)
Alkaline Phosphatase: 134 U/L — ABNORMAL HIGH (ref 39–117)
Bilirubin, Direct: 0.2 mg/dL (ref 0.0–0.3)
Total Bilirubin: 1.3 mg/dL — ABNORMAL HIGH (ref 0.3–1.2)
Total Protein: 7.5 g/dL (ref 6.0–8.3)

## 2013-03-13 NOTE — Assessment & Plan Note (Signed)
Plan repeat echocardiogram when she returns in 6 months.

## 2013-03-13 NOTE — Progress Notes (Signed)
HPI: FU CAD. Cardiac catheterization was performed in May of 2011. This revealed Normal LM, 60 LAD, 90 D2, 50-60 LCx, normal RCA, and nl LV function. Patient had PCI of the diagonal with DES at that time. Last echocardiogram in June of 2013 showed normal LV function, grade 1 diastolic dysfunction, mild aortic stenosis with a mean gradient of 11 mm of mercury, mild aortic insufficiency and mitral regurgitation and moderate left atrial enlargement. Carotid Dopplers in Oct 2013 were normal. Myoview in October of 2013 showed an ejection fraction of 85%. The images showed normal perfusion. There were ECG changes. I last saw her in June of 2014. Since then, she notes some dyspnea with walking up hills. No orthopnea, PND or pedal edema. She has had some chest soreness for 2 weeks after lifting something heavy. No exertional chest pain.   Current Outpatient Prescriptions  Medication Sig Dispense Refill  . aspirin 81 MG tablet Take 81 mg by mouth daily.        Marland Kitchen atorvastatin (LIPITOR) 40 MG tablet TAKE 1 TABLET BY MOUTH DAILY  90 tablet  0  . Calcium Carbonate (CALCIUM 600) 1500 MG TABS Take 1 tablet by mouth once.       . cholecalciferol (VITAMIN D) 1000 UNITS tablet Take 1,000 Units by mouth daily.      Marland Kitchen estradiol (VAGIFEM) 25 MCG vaginal tablet Place 25 mcg vaginally daily. Twice weekly      . fluticasone (FLONASE) 50 MCG/ACT nasal spray Place 2 sprays into the nose daily. AS NEEDED      . Loratadine (CLARITIN) 10 MG CAPS Take 1 capsule by mouth as needed. 24 hr tabs      . metoprolol succinate (TOPROL-XL) 25 MG 24 hr tablet TAKE 1 TABLET BY MOUTH DAILY.  90 tablet  2  . nitroGLYCERIN (NITROSTAT) 0.4 MG SL tablet Place 1 tablet (0.4 mg total) under the tongue every 5 (five) minutes as needed.  50 tablet  2   No current facility-administered medications for this visit.     Past Medical History  Diagnosis Date  . Heart murmur     mild aortic insufficiency per echo in May of 2011, mildly  elevated LVOT  . H/O: rheumatic fever   . Osteopenia   . CAD (coronary artery disease)     s/p cath after abnormal nuclear study; normal left main, 60% LAD, 90% 2nd DX, 50 -60% LCX and normal RCA with normal LV function; s/p PCI of the diagonal with DES; normal myoview in May 2012  . Hyperlipidemia   . Gallstones   . Allergy     seasonal  . History of colonoscopy   . Carotid disease, bilateral     0-39% per doppler in July of 2011  . Normal nuclear stress test May 2012    normal EF and normal perfusion.     Past Surgical History  Procedure Laterality Date  . Knee arthroscopy      Rt. Knee  . Cholecystectomy  nov 2004  . Vaginal hysterectomy  1994  . Angioplasty  may 2011    stents  . Tonsillectomy    . Colonoscopy      History   Social History  . Marital Status: Married    Spouse Name: N/A    Number of Children: 2  . Years of Education: N/A   Occupational History  . retired Runner, broadcasting/film/video   .     Social History Main Topics  . Smoking status:  Former Smoker    Types: Cigarettes    Quit date: 04/12/1977  . Smokeless tobacco: Never Used     Comment: quit age 50  . Alcohol Use: No  . Drug Use: No  . Sexual Activity: Not Currently   Other Topics Concern  . Not on file   Social History Narrative  . No narrative on file    ROS: no fevers or chills, productive cough, hemoptysis, dysphasia, odynophagia, melena, hematochezia, dysuria, hematuria, rash, seizure activity, orthopnea, PND, pedal edema, claudication. Remaining systems are negative.  Physical Exam: Well-developed well-nourished in no acute distress.  Skin is warm and dry.  HEENT is normal.  Neck is supple.  Chest is clear to auscultation with normal expansion.  Cardiovascular exam is regular rate and rhythm. 2/6 systolic murmur and diastolic murmur left sternal border. Abdominal exam nontender or distended. No masses palpated. Extremities show no edema. neuro grossly intact  ECG sinus rhythm at a rate of  67. No ST changes.

## 2013-03-13 NOTE — Patient Instructions (Signed)
Your physician wants you to follow-up in:  6 months. You will receive a reminder letter in the mail two months in advance. If you don't receive a letter, please call our office to schedule the follow-up appointment.  Your physician has requested that you have a stress echocardiogram. For further information please visit [email or web address] Please follow instruction sheet as given.

## 2013-03-13 NOTE — Assessment & Plan Note (Signed)
Continue aspirin and statin. 

## 2013-03-13 NOTE — Assessment & Plan Note (Signed)
Symptoms atypical and electrocardiogram normal. She does also note some dyspnea on exertion. Plan stress echocardiogram for risk stratification.

## 2013-03-13 NOTE — Assessment & Plan Note (Signed)
Continue statin. Check lipids and liver. 

## 2013-03-14 ENCOUNTER — Other Ambulatory Visit: Payer: Self-pay | Admitting: Cardiology

## 2013-03-14 ENCOUNTER — Telehealth: Payer: Self-pay | Admitting: Cardiology

## 2013-03-14 ENCOUNTER — Ambulatory Visit (HOSPITAL_COMMUNITY)
Admission: RE | Admit: 2013-03-14 | Discharge: 2013-03-14 | Disposition: A | Discharge status: Home / Self Care | Payer: Medicare Other | Source: Ambulatory Visit | Attending: Cardiology | Admitting: Cardiology

## 2013-03-14 DIAGNOSIS — I350 Nonrheumatic aortic (valve) stenosis: Secondary | ICD-10-CM

## 2013-03-14 DIAGNOSIS — R0602 Shortness of breath: Secondary | ICD-10-CM | POA: Insufficient documentation

## 2013-03-14 DIAGNOSIS — R072 Precordial pain: Secondary | ICD-10-CM | POA: Diagnosis not present

## 2013-03-14 DIAGNOSIS — I2510 Atherosclerotic heart disease of native coronary artery without angina pectoris: Secondary | ICD-10-CM

## 2013-03-14 DIAGNOSIS — R079 Chest pain, unspecified: Secondary | ICD-10-CM | POA: Insufficient documentation

## 2013-03-14 NOTE — Progress Notes (Signed)
Echocardiogram Echocardiogram Stress Test has been performed.  Anne Martin 03/14/2013, 11:59 AM

## 2013-03-14 NOTE — Telephone Encounter (Signed)
New problem:  Pt states she is returning a call to Dr. Ludwig Clarks nurse. Pt is requesting a call back

## 2013-03-14 NOTE — Telephone Encounter (Signed)
Spoke with pt, aware of echo results. 

## 2013-03-16 ENCOUNTER — Other Ambulatory Visit: Payer: Self-pay | Admitting: Cardiology

## 2013-06-17 ENCOUNTER — Other Ambulatory Visit: Payer: Self-pay | Admitting: Cardiology

## 2013-09-10 ENCOUNTER — Other Ambulatory Visit: Payer: Self-pay

## 2013-09-10 ENCOUNTER — Encounter: Payer: Self-pay | Admitting: Cardiology

## 2013-09-10 ENCOUNTER — Ambulatory Visit (INDEPENDENT_AMBULATORY_CARE_PROVIDER_SITE_OTHER): Payer: Medicare Other | Admitting: Cardiology

## 2013-09-10 VITALS — BP 110/60 | HR 53 | Ht 65.0 in | Wt 165.8 lb

## 2013-09-10 DIAGNOSIS — I2510 Atherosclerotic heart disease of native coronary artery without angina pectoris: Secondary | ICD-10-CM

## 2013-09-10 DIAGNOSIS — E785 Hyperlipidemia, unspecified: Secondary | ICD-10-CM | POA: Diagnosis not present

## 2013-09-10 DIAGNOSIS — Z1231 Encounter for screening mammogram for malignant neoplasm of breast: Secondary | ICD-10-CM

## 2013-09-10 NOTE — Assessment & Plan Note (Signed)
Continue aspirin and statin. 

## 2013-09-10 NOTE — Assessment & Plan Note (Signed)
Plan followup echocardiogram in one year.

## 2013-09-10 NOTE — Progress Notes (Signed)
HPI: FU CAD. Cardiac catheterization was performed in May of 2011. This revealed Normal LM, 60 LAD, 90 D2, 50-60 LCx, normal RCA, and nl LV function. Patient had PCI of the diagonal with DES at that time. Last echocardiogram in June of 2013 showed normal LV function, grade 1 diastolic dysfunction, mild aortic stenosis with a mean gradient of 11 mm of mercury, mild aortic insufficiency and mitral regurgitation and moderate left atrial enlargement. Carotid Dopplers in Oct 2013 were normal. Myoview in October of 2013 showed an ejection fraction of 85%. The images showed normal perfusion. There were ECG changes. Stress echocardiogram in December of 2014 was normal. I last saw her in Dec of 2014. Since then, the patient denies any dyspnea on exertion, orthopnea, PND, pedal edema, palpitations, syncope or chest pain.    Current Outpatient Prescriptions  Medication Sig Dispense Refill  . aspirin 81 MG tablet Take 81 mg by mouth daily.        Marland Kitchen atorvastatin (LIPITOR) 40 MG tablet TAKE 1 TABLET BY MOUTH EVERY DAY  90 tablet  0  . Calcium Carbonate (CALCIUM 600) 1500 MG TABS Take 1 tablet by mouth once.       . cholecalciferol (VITAMIN D) 1000 UNITS tablet Take 1,000 Units by mouth daily.      Marland Kitchen estradiol (VAGIFEM) 25 MCG vaginal tablet Place 25 mcg vaginally daily. Twice weekly      . fluticasone (FLONASE) 50 MCG/ACT nasal spray Place 2 sprays into the nose daily. AS NEEDED      . Loratadine (CLARITIN) 10 MG CAPS Take 1 capsule by mouth as needed. 24 hr tabs      . metoprolol succinate (TOPROL-XL) 25 MG 24 hr tablet TAKE 1 TABLET BY MOUTH EVERY DAY  90 tablet  1  . nitroGLYCERIN (NITROSTAT) 0.4 MG SL tablet Place 1 tablet (0.4 mg total) under the tongue every 5 (five) minutes as needed.  50 tablet  2   No current facility-administered medications for this visit.     Past Medical History  Diagnosis Date  . Heart murmur     mild aortic insufficiency per echo in May of 2011, mildly elevated LVOT   . H/O: rheumatic fever   . Osteopenia   . CAD (coronary artery disease)     s/p cath after abnormal nuclear study; normal left main, 60% LAD, 90% 2nd DX, 50 -60% LCX and normal RCA with normal LV function; s/p PCI of the diagonal with DES; normal myoview in May 2012  . Hyperlipidemia   . Gallstones   . Allergy     seasonal  . History of colonoscopy   . Carotid disease, bilateral     0-39% per doppler in July of 2011  . Normal nuclear stress test May 2012    normal EF and normal perfusion.     Past Surgical History  Procedure Laterality Date  . Knee arthroscopy      Rt. Knee  . Cholecystectomy  nov 2004  . Vaginal hysterectomy  1994  . Angioplasty  may 2011    stents  . Tonsillectomy    . Colonoscopy      History   Social History  . Marital Status: Married    Spouse Name: N/A    Number of Children: 2  . Years of Education: N/A   Occupational History  . retired Pharmacist, hospital   .     Social History Main Topics  . Smoking status: Former Smoker  Types: Cigarettes    Quit date: 04/12/1977  . Smokeless tobacco: Never Used     Comment: quit age 36  . Alcohol Use: No  . Drug Use: No  . Sexual Activity: Not Currently   Other Topics Concern  . Not on file   Social History Narrative  . No narrative on file    ROS: no fevers or chills, productive cough, hemoptysis, dysphasia, odynophagia, melena, hematochezia, dysuria, hematuria, rash, seizure activity, orthopnea, PND, pedal edema, claudication. Remaining systems are negative.  Physical Exam: Well-developed well-nourished in no acute distress.  Skin is warm and dry.  HEENT is normal.  Neck is supple.  Chest is clear to auscultation with normal expansion.  Cardiovascular exam is regular rate and rhythm.  Abdominal exam nontender or distended. No masses palpated. Extremities show no edema. neuro grossly intact  ECG Sinus bradycardia at a rate of 53. No ST changes to

## 2013-09-10 NOTE — Assessment & Plan Note (Signed)
Continue statin. 

## 2013-09-10 NOTE — Patient Instructions (Signed)
Your physician wants you to follow-up in: ONE YEAR WITH DR CRENSHAW You will receive a reminder letter in the mail two months in advance. If you don't receive a letter, please call our office to schedule the follow-up appointment.  

## 2013-09-25 ENCOUNTER — Other Ambulatory Visit: Payer: Self-pay | Admitting: *Deleted

## 2013-09-25 MED ORDER — METOPROLOL SUCCINATE ER 25 MG PO TB24: ORAL_TABLET | ORAL | Status: DC

## 2013-09-25 MED ORDER — ATORVASTATIN CALCIUM 40 MG PO TABS: ORAL_TABLET | ORAL | Status: DC

## 2013-11-16 ENCOUNTER — Ambulatory Visit: Payer: Medicare Other

## 2013-11-20 ENCOUNTER — Other Ambulatory Visit: Payer: Self-pay | Admitting: Obstetrics

## 2013-11-20 ENCOUNTER — Ambulatory Visit
Admission: RE | Admit: 2013-11-20 | Discharge: 2013-11-20 | Disposition: A | Discharge status: Home / Self Care | Payer: Medicare Other | Source: Ambulatory Visit

## 2013-11-20 DIAGNOSIS — Z1231 Encounter for screening mammogram for malignant neoplasm of breast: Secondary | ICD-10-CM | POA: Diagnosis not present

## 2013-11-20 DIAGNOSIS — E2839 Other primary ovarian failure: Secondary | ICD-10-CM

## 2013-11-20 DIAGNOSIS — Z124 Encounter for screening for malignant neoplasm of cervix: Secondary | ICD-10-CM | POA: Diagnosis not present

## 2013-11-22 ENCOUNTER — Ambulatory Visit
Admission: RE | Admit: 2013-11-22 | Discharge: 2013-11-22 | Disposition: A | Discharge status: Home / Self Care | Payer: Medicare Other | Source: Ambulatory Visit | Attending: Obstetrics | Admitting: Obstetrics

## 2013-11-22 DIAGNOSIS — M899 Disorder of bone, unspecified: Secondary | ICD-10-CM | POA: Diagnosis not present

## 2013-11-22 DIAGNOSIS — E2839 Other primary ovarian failure: Secondary | ICD-10-CM

## 2013-12-19 ENCOUNTER — Telehealth: Payer: Self-pay | Admitting: Cardiology

## 2013-12-19 NOTE — Telephone Encounter (Signed)
New Message  Pt called experiencing back and chest tightness. She is not sure if it is Muscle Tightness. Pt requests a call back to discuss.

## 2013-12-19 NOTE — Telephone Encounter (Signed)
Returned call to patient she stated for the past 2 weeks she has been having chest pain and pain in center of back off and on.Stated she has not taken a NTG.Stated she can have pain if she bends over or she may have pain if doing nothing.Stated no pain at present.Advised she can take a NTG if needed.Appointment scheduled with Truitt Merle NP tomorrow 12/20/13 at 11:00 am.Advised to go to ER if needed.

## 2013-12-20 ENCOUNTER — Ambulatory Visit (INDEPENDENT_AMBULATORY_CARE_PROVIDER_SITE_OTHER): Payer: Medicare Other | Admitting: Nurse Practitioner

## 2013-12-20 ENCOUNTER — Encounter: Payer: Self-pay | Admitting: Nurse Practitioner

## 2013-12-20 VITALS — BP 126/70 | HR 54 | Ht 65.0 in | Wt 166.4 lb

## 2013-12-20 DIAGNOSIS — I2510 Atherosclerotic heart disease of native coronary artery without angina pectoris: Secondary | ICD-10-CM | POA: Diagnosis not present

## 2013-12-20 DIAGNOSIS — R079 Chest pain, unspecified: Secondary | ICD-10-CM | POA: Diagnosis not present

## 2013-12-20 DIAGNOSIS — I209 Angina pectoris, unspecified: Secondary | ICD-10-CM

## 2013-12-20 LAB — TROPONIN I: Troponin I: <0.3 ng/mL (ref <0.06)

## 2013-12-20 MED ORDER — NITROGLYCERIN 0.4 MG SL SUBL: 0.4000 mg | SUBLINGUAL_TABLET | SUBLINGUAL | Status: DC | PRN

## 2013-12-20 MED ORDER — ISOSORBIDE MONONITRATE ER 30 MG PO TB24: 15.0000 mg | ORAL_TABLET | Freq: Every day | ORAL | Status: DC

## 2013-12-20 NOTE — Patient Instructions (Signed)
Use your NTG under your tongue for recurrent chest pain. May take one tablet every 5 minutes. If you are still having discomfort after 3 tablets in 15 minutes, call 911.  I am adding Imdur 30 mg a day - take only a half a pill each day   We will check lab today - this is to check heart enzymes  See me or Dr. Stanford Breed in about 10 days  Call the [place] office at [phone] if you have any questions, problems or concerns.

## 2013-12-20 NOTE — Progress Notes (Signed)
TAMEY WANEK Date of Birth: [birth year] Medical Record #[id number]  History of Present Illness: Ms. Schwarz is seen back today for a work in visit. Seen for Dr. Stanford Breed. She is a 68 year old female with known CAD with past PCI to the Dx. . Other issues include grade 1 diastolic dysfunction, mild AS. Last Myoview back in 2013 ok with EF 85% andnormal perfusion but did have EKG changes. Normal stress echo back in December.   Last seen here in June. Was felt to be doing ok. To come back in a year.  Comes back today. Here alone. Has had for the last 3 weeks some "uncomfortable" feeling in her chest. It is off and on. Moves around in her chest. She is worried. Stopped working out. Yesterday had a feeling for several hours where she felt like a band was around her chest - wanted to take her bra off. Did NOT use NTG. She did walk on her treadmill yesterday prior to this and did ok. Felt a little breathless as well - this is with bending over. She does not feel like this is like her prior chest pain syndrome. She did have residual disease at the time of her cath back in 2011.    Current Outpatient Prescriptions  Medication Sig Dispense Refill  . aspirin 81 MG tablet Take 81 mg by mouth daily.        Marland Kitchen atorvastatin (LIPITOR) 40 MG tablet TAKE 1 TABLET BY MOUTH EVERY DAY  90 tablet  1  . Calcium Carbonate (CALCIUM 600) 1500 MG TABS Take 1 tablet by mouth once.       . cholecalciferol (VITAMIN D) 1000 UNITS tablet Take 1,000 Units by mouth daily.      Marland Kitchen estradiol (VAGIFEM) 25 MCG vaginal tablet Place 25 mcg vaginally daily. Twice weekly      . fluticasone (FLONASE) 50 MCG/ACT nasal spray Place 2 sprays into the nose daily. AS NEEDED      . Loratadine (CLARITIN) 10 MG CAPS Take 1 capsule by mouth as needed. 24 hr tabs      . metoprolol succinate (TOPROL-XL) 25 MG 24 hr tablet TAKE 1 TABLET BY MOUTH EVERY DAY  90 tablet  1  . nitroGLYCERIN (NITROSTAT) 0.4 MG SL tablet Place 1 tablet (0.4 mg total) under the  tongue every 5 (five) minutes as needed.  25 tablet  3  . isosorbide mononitrate (IMDUR) 30 MG 24 hr tablet Take 0.5 tablets (15 mg total) by mouth daily.  30 tablet  3   No current facility-administered medications for this visit.    Allergies  Allergen Reactions  . Procaine Hcl     Past Medical History  Diagnosis Date  . Heart murmur     mild aortic insufficiency per echo in May of 2011, mildly elevated LVOT  . H/O: rheumatic fever   . Osteopenia   . CAD (coronary artery disease)     s/p cath after abnormal nuclear study; normal left main, 60% LAD, 90% 2nd DX, 50 -60% LCX and normal RCA with normal LV function; s/p PCI of the diagonal with DES; normal myoview in May 2012  . Hyperlipidemia   . Gallstones   . Allergy     seasonal  . History of colonoscopy   . Carotid disease, bilateral     0-39% per doppler in July of 2011  . Normal nuclear stress test May 2012    normal EF and normal perfusion.     Past  Surgical History  Procedure Laterality Date  . Knee arthroscopy      Rt. Knee  . Cholecystectomy  nov 2004  . Vaginal hysterectomy  1994  . Angioplasty  may 2011    stents  . Tonsillectomy    . Colonoscopy      History  Smoking status  . Former Smoker  . Types: Cigarettes  . Quit date: 04/12/1977  Smokeless tobacco  . Never Used    Comment: quit age 68    History  Alcohol Use No    Family History  Problem Relation Age of Onset  . Heart disease Mother   . Heart disease Father     CABG  . Heart disease Brother     2 stents  . Colon cancer      neg. hx.  . Ulcerative colitis Daughter     Review of Systems: The review of systems is per the HPI.  All other systems were reviewed and are negative.  Physical Exam: BP 126/70  Pulse 54  Ht 5\' 5"  (1.651 m)  Wt 166 lb 6.4 oz (75.479 kg)  BMI 27.69 kg/m2 Patient is very pleasant and in no acute distress. Skin is warm and dry. Color is normal.  HEENT is unremarkable. Normocephalic/atraumatic. PERRL.  Sclera are nonicteric. Neck is supple. No masses. No JVD. Lungs are clear. Cardiac exam shows a regular rate and rhythm. Soft outflow murmur. Abdomen is soft. Extremities are without edema. Gait and ROM are intact. No gross neurologic deficits noted.  Wt Readings from Last 3 Encounters:  12/20/13 166 lb 6.4 oz (75.479 kg)  09/10/13 165 lb 12.8 oz (75.206 kg)  03/13/13 180 lb (81.647 kg)    LABORATORY DATA/PROCEDURES: EKG shows sinus brady. Unchanged.   Lab Results  Component Value Date   WBC 8.1 09/21/2011   HGB 15.3* 09/21/2011   HCT 46.1* 09/21/2011   PLT 209.0 09/21/2011   GLUCOSE 79 12/05/2012   CHOL 132 03/13/2013   TRIG 62.0 03/13/2013   HDL 65.40 03/13/2013   LDLCALC 54 03/13/2013   ALT 31 03/13/2013   AST 37 03/13/2013   NA 139 12/05/2012   K 4.1 12/05/2012   CL 104 12/05/2012   CREATININE 0.8 12/05/2012   BUN 16 12/05/2012   CO2 29 12/05/2012   TSH 1.86 12/05/2012   INR 1.03 09/01/2009    BNP (last 3 results) No results found for this basename: PROBNP,  in the last 8760 hours  Stress ECG: The stress ECG was negative for ischemia.  ------------------------------------------------------------ Rest:  - The estimated LV ejection fraction was 65%. - Normal wall motion; no LV regional wall motion abnormalities. Immediate post stress:  - The estimated LV ejection fraction was >70%. - Normal wall motion; no LV regional wall motion abnormalities.  ------------------------------------------------------------ Prepared and Electronically Authenticated by  Darlin Coco 2014-12-03T13:25:35.957  Assessment / Plan: 1. Atypical chest pain - she has some reproducible symptoms on exam today - I think this is more musculoskeletal. The spell she had yesterday for several hours is more worrisome. Normal stress echo back in December. Will check Troponin. Refill NTG. Adding low dose Imdur at 15 mg a day. See back in about 10 days to reevaluate.   2. HLD - on statin  3. Known CAD with  past PCI but with residual disease in the mid LAD and mid LCX of a moderate nature noted in 2011.   Further disposition to follow.   Patient is agreeable to this plan and will call  if any problems develop in the interim.   Burtis Junes, RN, [facility] [address] [place],   [id number] [phone]

## 2013-12-31 ENCOUNTER — Ambulatory Visit (INDEPENDENT_AMBULATORY_CARE_PROVIDER_SITE_OTHER): Payer: Medicare Other | Admitting: Nurse Practitioner

## 2013-12-31 ENCOUNTER — Encounter: Payer: Self-pay | Admitting: Nurse Practitioner

## 2013-12-31 VITALS — BP 120/60 | HR 56 | Ht 65.0 in | Wt 170.8 lb

## 2013-12-31 DIAGNOSIS — I2510 Atherosclerotic heart disease of native coronary artery without angina pectoris: Secondary | ICD-10-CM | POA: Diagnosis not present

## 2013-12-31 DIAGNOSIS — I359 Nonrheumatic aortic valve disorder, unspecified: Secondary | ICD-10-CM | POA: Diagnosis not present

## 2013-12-31 DIAGNOSIS — R0789 Other chest pain: Secondary | ICD-10-CM

## 2013-12-31 DIAGNOSIS — I350 Nonrheumatic aortic (valve) stenosis: Secondary | ICD-10-CM

## 2013-12-31 NOTE — Patient Instructions (Signed)
We will obtain an echocardiogram and a stress Myoview  Stay on your current medicines  After your tests, we will then decide about when to come back  Call the [place] office at [phone] if you have any questions, problems or concerns.

## 2013-12-31 NOTE — Progress Notes (Signed)
Anne Martin Date of Birth: 07/26/1945 Medical Record #[id number]  History of Present Illness: Anne Martin is seen back today for a follow up visit. Seen for Anne Martin. She is a 68 year old female with known CAD with past PCI to the Dx in June of 2011 per Anne Martin. She had moderate residual disease in the LAD and LCX - managed medically . Other issues include grade 1 diastolic dysfunction, mild AS. Last Myoview back in 2013 ok with EF 85% and normal perfusion but did have EKG changes. Normal stress echo back in December of 2014.   Last seen here in June of 2015. Was felt to be doing ok. To come back in a year.   I saw her earlier this month for a work in visit. Had had for the last 3 weeks some "uncomfortable" feeling in her chest. It is off and on. Moves around in her chest. She was worried. Stopped working out. She had had a feeling for several hours where she felt like a band was around her chest - wanted to take her bra off. Did NOT use NTG. She did walk on her treadmill yesterday prior to this and did ok. Felt a little breathless as well - this is with bending over. She does not feel like this is like her prior chest pain syndrome. She did have residual disease at the time of her cath back in 2011. I checked a troponin which was negative. Added Imdur at low dose.   Comes back today. Here alone. She feels like the Imdur has helped her. No more "band like" pain. Still gets short of breath with bending over and going up hills. Sometimes feels like a "fist" is in her chest - no burning in the chest like she had back in 2013 prior to her PCI. Her energy level is ok. Her FH is very strong - mother died at 49 with CAD - she is worried because she is turning 9 soon.  Current Outpatient Prescriptions  Medication Sig Dispense Refill  . aspirin 81 MG tablet Take 81 mg by mouth daily.        Marland Kitchen atorvastatin (LIPITOR) 40 MG tablet TAKE 1 TABLET BY MOUTH EVERY DAY  90 tablet  1  . Calcium Carbonate  (CALCIUM 600) 1500 MG TABS Take 1 tablet by mouth once.       . cholecalciferol (VITAMIN D) 1000 UNITS tablet Take 1,000 Units by mouth daily.      Marland Kitchen estradiol (VAGIFEM) 25 MCG vaginal tablet Place 25 mcg vaginally daily. Twice weekly      . fluticasone (FLONASE) 50 MCG/ACT nasal spray Place 2 sprays into the nose daily. AS NEEDED      . isosorbide mononitrate (IMDUR) 30 MG 24 hr tablet Take 0.5 tablets (15 mg total) by mouth daily.  30 tablet  3  . Loratadine (CLARITIN) 10 MG CAPS Take 1 capsule by mouth as needed. 24 hr tabs      . metoprolol succinate (TOPROL-XL) 25 MG 24 hr tablet TAKE 1 TABLET BY MOUTH EVERY DAY  90 tablet  1  . nitroGLYCERIN (NITROSTAT) 0.4 MG SL tablet Place 1 tablet (0.4 mg total) under the tongue every 5 (five) minutes as needed.  25 tablet  3   No current facility-administered medications for this visit.    Allergies  Allergen Reactions  . Procaine Hcl     Past Medical History  Diagnosis Date  . Heart murmur  mild aortic insufficiency per echo in May of 2011, mildly elevated LVOT  . H/O: rheumatic fever   . Osteopenia   . CAD (coronary artery disease)     s/p cath after abnormal nuclear study; normal left main, 60% LAD, 90% 2nd DX, 50 -60% LCX and normal RCA with normal LV function; s/p PCI of the diagonal with DES; normal myoview in May 2012  . Hyperlipidemia   . Gallstones   . Allergy     seasonal  . History of colonoscopy   . Carotid disease, bilateral     0-39% per doppler in July of 2011  . Normal nuclear stress test May 2012    normal EF and normal perfusion.     Past Surgical History  Procedure Laterality Date  . Knee arthroscopy      Rt. Knee  . Cholecystectomy  nov 2004  . Vaginal hysterectomy  1994  . Angioplasty  may 2011    stents  . Tonsillectomy    . Colonoscopy      History  Smoking status  . Former Smoker  . Types: Cigarettes  . Quit date: 04/12/1977  Smokeless tobacco  . Never Used    Comment: quit age 58     History  Alcohol Use No    Family History  Problem Relation Age of Onset  . Heart disease Mother   . Heart disease Father     CABG  . Heart disease Brother     2 stents  . Colon cancer      neg. hx.  . Ulcerative colitis Daughter     Review of Systems: The review of systems is per the HPI.  All other systems were reviewed and are negative.  Physical Exam: BP 120/60  Pulse 56  Ht 5\' 5"  (1.651 m)  Wt 170 lb 12.8 oz (77.474 kg)  BMI 28.42 kg/m2  SpO2 96% Patient is very pleasant and in no acute distress. Skin is warm and dry. Color is normal.  HEENT is unremarkable. Normocephalic/atraumatic. PERRL. Sclera are nonicteric. Neck is supple. No masses. No JVD. Lungs are clear. Cardiac exam shows a regular rate and rhythm. Abdomen is soft. Extremities are without edema. Gait and ROM are intact. No gross neurologic deficits noted.  Wt Readings from Last 3 Encounters:  12/31/13 170 lb 12.8 oz (77.474 kg)  12/20/13 166 lb 6.4 oz (75.479 kg)  09/10/13 165 lb 12.8 oz (75.206 kg)    LABORATORY DATA/PROCEDURES:  Lab Results  Component Value Date   WBC 8.1 09/21/2011   HGB 15.3* 09/21/2011   HCT 46.1* 09/21/2011   PLT 209.0 09/21/2011   GLUCOSE 79 12/05/2012   CHOL 132 03/13/2013   TRIG 62.0 03/13/2013   HDL 65.40 03/13/2013   LDLCALC 54 03/13/2013   ALT 31 03/13/2013   AST 37 03/13/2013   NA 139 12/05/2012   K 4.1 12/05/2012   CL 104 12/05/2012   CREATININE 0.8 12/05/2012   BUN 16 12/05/2012   CO2 29 12/05/2012   TSH 1.86 12/05/2012   INR 1.03 09/01/2009    BNP (last 3 results) No results found for this basename: PROBNP,  in the last 8760 hours  Echo Study Conclusions from 2013  - Left ventricle: The cavity size was normal. Wall thickness was normal. Systolic function was normal. The estimated ejection fraction was in the range of 60% to 65%. Wall motion was normal; there were no regional wall motion abnormalities. Doppler parameters are consistent with abnormal left  ventricular  relaxation (grade 1 diastolic dysfunction). - Aortic valve: There was mild stenosis. Mild regurgitation. Mean gradient: 22mm Hg (S). Peak gradient: 23mm Hg (S). - Mitral valve: Mild regurgitation. - Left atrium: The atrium was moderately dilated   Stress Echo Study Conclusions from December 2015  Stress ECG conclusions: The stress ECG was negative for ischemia.  Impressions:  - Normal study after maximal exercise.    Assessment / Plan: 1. Chest pain - with some improvement with long acting nitrate but not totally improved. Will arrange for stress Myoview  2. AS - mild by last echo - will update her echo  3. HLD - on statin   4. Known CAD with past PCI but with residual disease in the mid LAD and mid LCX of a moderate nature noted in 2011.   Further disposition to follow.   Patient is agreeable to this plan and will call if any problems develop in the interim.   Burtis Junes, RN, [place] [address] [place], [place]  [phone]

## 2014-01-01 DIAGNOSIS — Z23 Encounter for immunization: Secondary | ICD-10-CM | POA: Diagnosis not present

## 2014-01-03 ENCOUNTER — Ambulatory Visit (HOSPITAL_COMMUNITY): Payer: Medicare Other | Attending: Family Medicine | Admitting: Radiology

## 2014-01-03 ENCOUNTER — Other Ambulatory Visit (HOSPITAL_COMMUNITY): Payer: Medicare Other

## 2014-01-03 DIAGNOSIS — R011 Cardiac murmur, unspecified: Secondary | ICD-10-CM | POA: Insufficient documentation

## 2014-01-03 DIAGNOSIS — R0789 Other chest pain: Secondary | ICD-10-CM

## 2014-01-03 DIAGNOSIS — R079 Chest pain, unspecified: Secondary | ICD-10-CM | POA: Insufficient documentation

## 2014-01-03 DIAGNOSIS — I359 Nonrheumatic aortic valve disorder, unspecified: Secondary | ICD-10-CM | POA: Insufficient documentation

## 2014-01-03 DIAGNOSIS — I350 Nonrheumatic aortic (valve) stenosis: Secondary | ICD-10-CM

## 2014-01-03 DIAGNOSIS — R0989 Other specified symptoms and signs involving the circulatory and respiratory systems: Secondary | ICD-10-CM | POA: Insufficient documentation

## 2014-01-03 NOTE — Progress Notes (Signed)
Echocardiogram performed.  

## 2014-01-04 ENCOUNTER — Ambulatory Visit (HOSPITAL_COMMUNITY)
Admission: RE | Admit: 2014-01-04 | Discharge: 2014-01-04 | Disposition: A | Discharge status: Home / Self Care | Payer: Medicare Other | Source: Ambulatory Visit | Attending: Cardiovascular Disease | Admitting: Cardiovascular Disease

## 2014-01-04 ENCOUNTER — Encounter: Payer: Self-pay | Admitting: Cardiology

## 2014-01-04 DIAGNOSIS — R0609 Other forms of dyspnea: Secondary | ICD-10-CM | POA: Diagnosis not present

## 2014-01-04 DIAGNOSIS — R0789 Other chest pain: Secondary | ICD-10-CM | POA: Diagnosis not present

## 2014-01-04 DIAGNOSIS — E785 Hyperlipidemia, unspecified: Secondary | ICD-10-CM | POA: Diagnosis not present

## 2014-01-04 DIAGNOSIS — Z8249 Family history of ischemic heart disease and other diseases of the circulatory system: Secondary | ICD-10-CM | POA: Diagnosis not present

## 2014-01-04 DIAGNOSIS — I350 Nonrheumatic aortic (valve) stenosis: Secondary | ICD-10-CM

## 2014-01-04 DIAGNOSIS — I2510 Atherosclerotic heart disease of native coronary artery without angina pectoris: Secondary | ICD-10-CM | POA: Diagnosis not present

## 2014-01-04 DIAGNOSIS — R079 Chest pain, unspecified: Secondary | ICD-10-CM | POA: Diagnosis not present

## 2014-01-04 DIAGNOSIS — Z87891 Personal history of nicotine dependence: Secondary | ICD-10-CM | POA: Diagnosis not present

## 2014-01-04 DIAGNOSIS — E669 Obesity, unspecified: Secondary | ICD-10-CM | POA: Insufficient documentation

## 2014-01-04 DIAGNOSIS — R0989 Other specified symptoms and signs involving the circulatory and respiratory systems: Secondary | ICD-10-CM | POA: Diagnosis not present

## 2014-01-04 MED ORDER — TECHNETIUM TC 99M SESTAMIBI GENERIC - CARDIOLITE
10.0000 | Freq: Once | INTRAVENOUS | Status: AC | PRN
  Administered 2014-01-04: 10 via INTRAVENOUS

## 2014-01-04 MED ORDER — TECHNETIUM TC 99M SESTAMIBI GENERIC - CARDIOLITE
30.0000 | Freq: Once | INTRAVENOUS | Status: AC | PRN
Start: 2014-01-04 — End: 2014-01-04
  Administered 2014-01-04: 30 via INTRAVENOUS

## 2014-01-04 NOTE — Procedures (Addendum)
[facility] [address] [place] [address] [phone]  Cardiology Nuclear Med Study  Anne Martin is a 68 y.o. female     MRN : [id number]     DOB: Sep 06, 1945  Procedure Date: 01/04/2014  Nuclear Med Background Indication for Stress Test:  Evaluation for Ischemia History:  CAD,Steny placement 5/11 Cardiac Risk Factors: Carotid Disease, Family History - CAD, History of Smoking, Lipids and Overweight  Symptoms:  Chest Pain and DOE   Nuclear Pre-Procedure Caffeine/Decaff Intake:  7:00pm NPO After: 5:00am   IV Site: R Antecubital  IV 0.9% NS with Angio Cath:  22g  Chest Size (in):  n/a IV Started by: Otho Perl, CNMT  Height: 5\' 5"  (1.651 m)  Cup Size: 36C  BMI:  Body mass index is 28.29 kg/(m^2). Weight:  170 lb (77.111 kg)   Tech Comments:  Held metoprolol day of test    Nuclear Med Study 1 or 2 day study: 1 day  Stress Test Type:  Stress  Order Authorizing Provider:  Kirk Ruths, MD   Resting Radionuclide: Technetium 2m Sestamibi  Resting Radionuclide Dose: 10.6 mCi   Stress Radionuclide:  Technetium 44m Sestamibi  Stress Radionuclide Dose: 30.8 mCi           Stress Protocol Rest HR: 60 Stress HR: 155  Rest BP: 155/84 Stress BP: 218/129  Exercise Time (min): 7:39 METS: 9.5   Predicted Max HR: 152 bpm % Max HR: 101.97 bpm Rate Pressure Product: 33790  Dose of Adenosine (mg):  n/a Dose of Lexiscan: n/a mg  Dose of Atropine (mg): n/a Dose of Dobutamine: n/a mcg/kg/min (at max HR)  Stress Test Technologist: Leane Para, CCT Nuclear Technologist: Otho Perl, CNMT   Rest Procedure:  Myocardial perfusion imaging was performed at rest 45 minutes following the intravenous administration of Technetium 83m Sestamibi. Stress Procedure:  The patient performed treadmill exercise using a Bruce  Protocol for 7:39 minutes. The patient stopped due to Marked SOB and Diastolic HTN and denied any chest pain.   There were no significant ST-T wave changes.  Technetium 108m Sestamibi was injected IV at peak exercise and myocardial perfusion imaging was performed after a brief delay.  Transient Ischemic Dilatation (Normal <1.22):  1.05 QGS EDV:  68 ml QGS ESV:  19 ml LV Ejection Fraction: 72%     Rest ECG: NSR - Normal EKG  Stress ECG: Significant ST abnormalities consistent with ischemia. Horizontal ST depression, 1-2 mm is seen in both inferior and lateral precordial leads  QPS Raw Data Images:  Normal; no motion artifact; normal heart/lung ratio. Stress Images:  Normal homogeneous uptake in all areas of the myocardium. Rest Images:  Normal homogeneous uptake in all areas of the myocardium. Subtraction (SDS):  No evidence of ischemia. LV Wall Motion:  NL LV Function; NL Wall Motion  Impression Exercise Capacity:  Good exercise capacity. BP Response:  Hypertensive blood pressure response. Clinical Symptoms:  There is dyspnea. ECG Impression:  Significant ST abnormalities consistent with ischemia. Comparison with Prior Nuclear Study: No significant change from previous study   Overall Impression:  Low risk stress nuclear study with "false positive" ECG with exercise and hypertensive response to exercise.   Sanda Klein, MD  01/04/2014 12:36 PM

## 2014-01-04 NOTE — Telephone Encounter (Signed)
This encounter was created in error - please disregard.

## 2014-01-04 NOTE — Telephone Encounter (Signed)
New problem   Pt want to know results Echocardiogram she had done yesterday and stress she had done today.

## 2014-01-18 ENCOUNTER — Ambulatory Visit (INDEPENDENT_AMBULATORY_CARE_PROVIDER_SITE_OTHER): Payer: Medicare Other | Admitting: Family Medicine

## 2014-01-18 ENCOUNTER — Encounter: Payer: Self-pay | Admitting: Family Medicine

## 2014-01-18 VITALS — BP 110/63 | HR 59 | Temp 97.6°F | Ht 64.5 in | Wt 171.0 lb

## 2014-01-18 DIAGNOSIS — I2510 Atherosclerotic heart disease of native coronary artery without angina pectoris: Secondary | ICD-10-CM

## 2014-01-18 DIAGNOSIS — I209 Angina pectoris, unspecified: Secondary | ICD-10-CM

## 2014-01-18 DIAGNOSIS — M899 Disorder of bone, unspecified: Secondary | ICD-10-CM

## 2014-01-18 DIAGNOSIS — I25118 Atherosclerotic heart disease of native coronary artery with other forms of angina pectoris: Secondary | ICD-10-CM | POA: Diagnosis not present

## 2014-01-18 DIAGNOSIS — I350 Nonrheumatic aortic (valve) stenosis: Secondary | ICD-10-CM

## 2014-01-18 DIAGNOSIS — E785 Hyperlipidemia, unspecified: Secondary | ICD-10-CM

## 2014-01-18 DIAGNOSIS — M949 Disorder of cartilage, unspecified: Secondary | ICD-10-CM

## 2014-01-18 LAB — HEPATIC FUNCTION PANEL
ALT: 25 U/L (ref 0–35)
AST: 34 U/L (ref 0–37)
Albumin: 3.9 g/dL (ref 3.5–5.2)
Alkaline Phosphatase: 133 U/L — ABNORMAL HIGH (ref 39–117)
Bilirubin, Direct: 0.2 mg/dL (ref 0.0–0.3)
Total Bilirubin: 1.2 mg/dL (ref 0.2–1.2)
Total Protein: 7.6 g/dL (ref 6.0–8.3)

## 2014-01-18 LAB — CBC WITH DIFFERENTIAL/PLATELET
Basophils Absolute: 0 10*3/uL (ref 0.0–0.1)
Basophils Relative: 0.5 % (ref 0.0–3.0)
Eosinophils Absolute: 0.3 10*3/uL (ref 0.0–0.7)
Eosinophils Relative: 3.2 % (ref 0.0–5.0)
HCT: 46.9 % — ABNORMAL HIGH (ref 36.0–46.0)
Hemoglobin: 15.2 g/dL — ABNORMAL HIGH (ref 12.0–15.0)
Lymphocytes Relative: 27 % (ref 12.0–46.0)
Lymphs Abs: 2.4 10*3/uL (ref 0.7–4.0)
MCHC: 32.4 g/dL (ref 30.0–36.0)
MCV: 80.8 fl (ref 78.0–100.0)
Monocytes Absolute: 0.5 10*3/uL (ref 0.1–1.0)
Monocytes Relative: 5.5 % (ref 3.0–12.0)
Neutro Abs: 5.7 10*3/uL (ref 1.4–7.7)
Neutrophils Relative %: 63.8 % (ref 43.0–77.0)
Platelets: 230 10*3/uL (ref 150.0–400.0)
RBC: 5.8 Mil/uL — ABNORMAL HIGH (ref 3.87–5.11)
RDW: 15.1 % (ref 11.5–15.5)
WBC: 8.9 10*3/uL (ref 4.0–10.5)

## 2014-01-18 LAB — BASIC METABOLIC PANEL
BUN: 16 mg/dL (ref 6–23)
CO2: 27 mEq/L (ref 19–32)
Calcium: 10 mg/dL (ref 8.4–10.5)
Chloride: 104 mEq/L (ref 96–112)
Creatinine, Ser: 0.8 mg/dL (ref 0.4–1.2)
GFR: 72.47 mL/min (ref >60.00)
Glucose, Bld: 82 mg/dL (ref 70–99)
Potassium: 4.8 mEq/L (ref 3.5–5.1)
Sodium: 139 mEq/L (ref 135–145)

## 2014-01-18 LAB — LIPID PANEL
Cholesterol: 134 mg/dL (ref 0–200)
HDL: 61.9 mg/dL (ref >39.00)
LDL Cholesterol: 56 mg/dL (ref 0–99)
NonHDL: 72.1
Total CHOL/HDL Ratio: 2
Triglycerides: 80 mg/dL (ref 0.0–149.0)
VLDL: 16 mg/dL (ref 0.0–40.0)

## 2014-01-18 LAB — POCT URINALYSIS DIPSTICK
Bilirubin, UA: NEGATIVE
Glucose, UA: NEGATIVE
Ketones, UA: NEGATIVE
Leukocytes, UA: NEGATIVE
Nitrite, UA: NEGATIVE
Protein, UA: NEGATIVE
Spec Grav, UA: 1.02
Urobilinogen, UA: 0.2
pH, UA: 5

## 2014-01-18 LAB — TSH: TSH: 0.99 u[IU]/mL (ref 0.35–4.50)

## 2014-01-18 MED ORDER — ISOSORBIDE MONONITRATE ER 30 MG PO TB24: 30.0000 mg | ORAL_TABLET | Freq: Every day | ORAL | Status: DC

## 2014-01-18 NOTE — Progress Notes (Signed)
   Subjective:    Patient ID: Anne Martin, female    DOB: 1945/09/23, 68 y.o.   MRN: [id number]  HPI 68 yr old female for a cpx. She feels fine but does mention some mild chest discomfort and SOB on exertion. She gives the example of walking to the top of a parking garage that can cause this. She just had a Myoview treadmill a few weeks ago that was read as a "low risk study". She has known mild CAD from a catheterization a few years ago. Anne Martin in Cardiology recently started her on Imdur at 15 mg a day, and she says this has helped. However she still gets some mild chest heaviness at times.    Review of Systems  Constitutional: Negative.   HENT: Negative.   Eyes: Negative.   Respiratory: Positive for shortness of breath. Negative for apnea, cough, choking, chest tightness, wheezing and stridor.   Cardiovascular: Positive for chest pain. Negative for palpitations and leg swelling.  Gastrointestinal: Negative.   Genitourinary: Negative for dysuria, urgency, frequency, hematuria, flank pain, decreased urine volume, enuresis, difficulty urinating, pelvic pain and dyspareunia.  Musculoskeletal: Negative.   Skin: Negative.   Neurological: Negative.   Psychiatric/Behavioral: Negative.        Objective:   Physical Exam  Constitutional: She is oriented to person, place, and time. She appears well-developed and well-nourished. No distress.  HENT:  Head: Normocephalic and atraumatic.  Right Ear: External ear normal.  Left Ear: External ear normal.  Nose: Nose normal.  Mouth/Throat: Oropharynx is clear and moist. No oropharyngeal exudate.  Eyes: Conjunctivae and EOM are normal. Pupils are equal, round, and reactive to light. No scleral icterus.  Neck: Normal range of motion. Neck supple. No JVD present. No thyromegaly present.  Cardiovascular: Normal rate, regular rhythm, normal heart sounds and intact distal pulses.  Exam reveals no gallop and no friction rub.   Stable 1/6 SM over the aortic  area   Pulmonary/Chest: Effort normal and breath sounds normal. No respiratory distress. She has no wheezes. She has no rales. She exhibits no tenderness.  Abdominal: Soft. Bowel sounds are normal. She exhibits no distension and no mass. There is no tenderness. There is no rebound and no guarding.  Musculoskeletal: Normal range of motion. She exhibits no edema and no tenderness.  Lymphadenopathy:    She has no cervical adenopathy.  Neurological: She is alert and oriented to person, place, and time. She has normal reflexes. No cranial nerve deficit. She exhibits normal muscle tone. Coordination normal.  Skin: Skin is warm and dry. No rash noted. No erythema.  Psychiatric: She has a normal mood and affect. Her behavior is normal. Judgment and thought content normal.          Assessment & Plan:  Well exam. We will increase the Imdur to 30 mg daily. She will monitor her BP at home. Recheck as scheduled. Get fasting labs today

## 2014-01-18 NOTE — Progress Notes (Signed)
Pre visit review using our clinic review tool, if applicable. No additional management support is needed unless otherwise documented below in the visit note. 

## 2014-01-25 DIAGNOSIS — D2312 Other benign neoplasm of skin of left eyelid, including canthus: Secondary | ICD-10-CM | POA: Diagnosis not present

## 2014-01-25 DIAGNOSIS — H2513 Age-related nuclear cataract, bilateral: Secondary | ICD-10-CM | POA: Diagnosis not present

## 2014-01-25 DIAGNOSIS — H02831 Dermatochalasis of right upper eyelid: Secondary | ICD-10-CM | POA: Diagnosis not present

## 2014-01-25 DIAGNOSIS — H02834 Dermatochalasis of left upper eyelid: Secondary | ICD-10-CM | POA: Diagnosis not present

## 2014-01-28 ENCOUNTER — Telehealth: Payer: Self-pay | Admitting: Family Medicine

## 2014-01-28 NOTE — Telephone Encounter (Signed)
Pt called to say she would like her lab results mail to her.  Pt said she has been contacting the pharmacy about the rx below and they keep telling her that they do have it. Please resend    Pt request refill of the following: nitroGLYCERIN (NITROSTAT) 0.4 MG SL tablet    Phamacy:  [facility]

## 2014-01-29 ENCOUNTER — Telehealth: Payer: Self-pay | Admitting: Cardiology

## 2014-01-29 MED ORDER — ISOSORBIDE MONONITRATE ER 30 MG PO TB24: 30.0000 mg | ORAL_TABLET | Freq: Every day | ORAL | Status: DC

## 2014-01-29 NOTE — Telephone Encounter (Signed)
Pt called in stating that she is having trouble getting her prescription for NTG and would like to speak to Hilda Blades about it. Please call  Thanks

## 2014-01-29 NOTE — Telephone Encounter (Signed)
Message sent to [facility].

## 2014-01-29 NOTE — Telephone Encounter (Signed)
I spoke with pharmacy and pt does have refills. I put a copy of lab results in mail to pt.

## 2014-01-29 NOTE — Telephone Encounter (Signed)
Spoke with pt, refill for isosorbide sent to the pharm

## 2014-02-12 DIAGNOSIS — L814 Other melanin hyperpigmentation: Secondary | ICD-10-CM | POA: Diagnosis not present

## 2014-02-12 DIAGNOSIS — D229 Melanocytic nevi, unspecified: Secondary | ICD-10-CM | POA: Diagnosis not present

## 2014-02-12 DIAGNOSIS — D2239 Melanocytic nevi of other parts of face: Secondary | ICD-10-CM | POA: Diagnosis not present

## 2014-02-12 DIAGNOSIS — L820 Inflamed seborrheic keratosis: Secondary | ICD-10-CM | POA: Diagnosis not present

## 2014-03-25 ENCOUNTER — Other Ambulatory Visit: Payer: Self-pay | Admitting: *Deleted

## 2014-03-25 MED ORDER — METOPROLOL SUCCINATE ER 25 MG PO TB24: ORAL_TABLET | ORAL | Status: DC

## 2014-03-29 DIAGNOSIS — Z884 Allergy status to anesthetic agent status: Secondary | ICD-10-CM | POA: Diagnosis not present

## 2014-03-29 DIAGNOSIS — J019 Acute sinusitis, unspecified: Secondary | ICD-10-CM | POA: Diagnosis not present

## 2014-03-29 DIAGNOSIS — E785 Hyperlipidemia, unspecified: Secondary | ICD-10-CM | POA: Diagnosis not present

## 2014-03-29 DIAGNOSIS — Z79899 Other long term (current) drug therapy: Secondary | ICD-10-CM | POA: Diagnosis not present

## 2014-03-29 DIAGNOSIS — I2510 Atherosclerotic heart disease of native coronary artery without angina pectoris: Secondary | ICD-10-CM | POA: Diagnosis not present

## 2014-04-10 ENCOUNTER — Other Ambulatory Visit: Payer: Self-pay | Admitting: Cardiology

## 2014-04-10 MED ORDER — ATORVASTATIN CALCIUM 40 MG PO TABS: ORAL_TABLET | ORAL | Status: DC

## 2014-08-05 ENCOUNTER — Other Ambulatory Visit: Payer: Self-pay

## 2014-08-05 MED ORDER — METOPROLOL SUCCINATE ER 25 MG PO TB24: ORAL_TABLET | ORAL | Status: DC

## 2014-08-05 MED ORDER — ATORVASTATIN CALCIUM 40 MG PO TABS: ORAL_TABLET | ORAL | Status: DC

## 2014-09-05 NOTE — Progress Notes (Signed)
HPI: FU CAD. Cardiac catheterization was performed in May of 2011. This revealed Normal LM, 60 LAD, 90 D2, 50-60 LCx, normal RCA, and nl LV function. Patient had PCI of the diagonal with DES at that time. Carotid Dopplers in Oct 2013 were normal. Echo 9/15 showed normal LV function, grade 1 diastolic dysfunction, mild AS with mean gradient 14 mmHg, mild AI, mild MR. Nuclear study 9/15 showed EF 72 and normal perfusion. Since I last saw her, the patient has dyspnea with more extreme activities but not with routine activities. It is relieved with rest. It is not associated with chest pain. There is no orthopnea, PND or pedal edema. There is no syncope or palpitations. There is no exertional chest pain.   Current Outpatient Prescriptions  Medication Sig Dispense Refill  . aspirin 81 MG tablet Take 81 mg by mouth daily.      Marland Kitchen atorvastatin (LIPITOR) 40 MG tablet TAKE 1 TABLET BY MOUTH EVERY DAY 90 tablet 1  . Calcium Carbonate (CALCIUM 600) 1500 MG TABS Take 1 tablet by mouth once.     . cholecalciferol (VITAMIN D) 1000 UNITS tablet Take 1,000 Units by mouth daily.    Marland Kitchen estradiol (VAGIFEM) 25 MCG vaginal tablet Place 25 mcg vaginally daily. Twice weekly    . fluticasone (FLONASE) 50 MCG/ACT nasal spray Place 2 sprays into the nose daily. AS NEEDED    . fluticasone (FLONASE) 50 MCG/ACT nasal spray as needed.    . isosorbide mononitrate (IMDUR) 30 MG 24 hr tablet Take 1 tablet (30 mg total) by mouth daily. 90 tablet 3  . Loratadine (CLARITIN) 10 MG CAPS Take 1 capsule by mouth as needed. 24 hr tabs    . metoprolol succinate (TOPROL-XL) 25 MG 24 hr tablet TAKE 1 TABLET BY MOUTH EVERY DAY 90 tablet 1  . nitroGLYCERIN (NITROSTAT) 0.4 MG SL tablet Place 1 tablet (0.4 mg total) under the tongue every 5 (five) minutes as needed. 25 tablet 3   No current facility-administered medications for this visit.     Past Medical History  Diagnosis Date  . Heart murmur     mild aortic insufficiency per  echo in May of 2011, mildly elevated LVOT  . H/O: rheumatic fever   . Osteopenia     last DEXA 11-22-13   . CAD (coronary artery disease)     s/p cath after abnormal nuclear study; normal left main, 60% LAD, 90% 2nd DX, 50 -60% LCX and normal RCA with normal LV function; s/p PCI of the diagonal with DES; normal myoview in May 2012  . Hyperlipidemia   . Gallstones   . Allergy     seasonal  . History of colonoscopy   . Carotid disease, bilateral     0-39% per doppler in July of 2011  . Normal nuclear stress test May 2012    normal EF and normal perfusion.     Past Surgical History  Procedure Laterality Date  . Knee arthroscopy      Rt. Knee  . Cholecystectomy  nov 2004  . Vaginal hysterectomy  1994  . Angioplasty  may 2011    stents  . Tonsillectomy    . Colonoscopy  12-22-10    per Dr. Henrene Pastor, adenomatous polyps, repeat in 5 yrs     History   Social History  . Marital Status: Married    Spouse Name: N/A  . Number of Children: 2  . Years of Education: N/A   Occupational History  .  retired Pharmacist, hospital   .     Social History Main Topics  . Smoking status: Former Smoker    Types: Cigarettes    Quit date: 04/12/1977  . Smokeless tobacco: Never Used     Comment: quit age 46  . Alcohol Use: No  . Drug Use: No  . Sexual Activity: Not Currently   Other Topics Concern  . Not on file   Social History Narrative    ROS: no fevers or chills, productive cough, hemoptysis, dysphasia, odynophagia, melena, hematochezia, dysuria, hematuria, rash, seizure activity, orthopnea, PND, pedal edema, claudication. Remaining systems are negative.  Physical Exam: Well-developed well-nourished in no acute distress.  Skin is warm and dry.  HEENT is normal.  Neck is supple.  Chest is clear to auscultation with normal expansion.  Cardiovascular exam is regular rate and rhythm. 2/6 systolic murmur left sternal border. Abdominal exam nontender or distended. No masses palpated. Extremities  show no edema. neuro grossly intact  ECG sinus rhythm at a rate of 63. No ST changes.

## 2014-09-10 ENCOUNTER — Encounter: Payer: Self-pay | Admitting: Cardiology

## 2014-09-10 ENCOUNTER — Ambulatory Visit (INDEPENDENT_AMBULATORY_CARE_PROVIDER_SITE_OTHER): Payer: Medicare Other | Admitting: Cardiology

## 2014-09-10 VITALS — BP 114/62 | HR 63 | Ht 65.0 in | Wt 175.0 lb

## 2014-09-10 DIAGNOSIS — E785 Hyperlipidemia, unspecified: Secondary | ICD-10-CM

## 2014-09-10 MED ORDER — ATORVASTATIN CALCIUM 80 MG PO TABS: 80.0000 mg | ORAL_TABLET | Freq: Every day | ORAL | Status: DC

## 2014-09-10 NOTE — Assessment & Plan Note (Signed)
Given documented coronary artery disease increase Lipitor to 80 mg daily. Check lipids and liver in 4 weeks. 

## 2014-09-10 NOTE — Patient Instructions (Signed)
Your physician wants you to follow-up in: [place] will receive a reminder letter in the mail two months in advance. If you don't receive a letter, please call our office to schedule the follow-up appointment.   INCREASE ATORVASTATIN TO 80 MG ONCE DAILY= 2 OF THE 40 MG TABLETS ONCE DAILY  Your physician recommends that you return for lab work in: 4 WEEKS= DO NOT EAT PRIOR TO LAB WORK

## 2014-09-10 NOTE — Assessment & Plan Note (Signed)
Mild on most recent echo. Follow-up echoes in the future.

## 2014-09-10 NOTE — Assessment & Plan Note (Signed)
Continue aspirin and statin. 

## 2014-09-23 ENCOUNTER — Other Ambulatory Visit: Payer: Self-pay

## 2014-09-23 MED ORDER — METOPROLOL SUCCINATE ER 25 MG PO TB24: ORAL_TABLET | ORAL | Status: DC

## 2014-10-22 DIAGNOSIS — E785 Hyperlipidemia, unspecified: Secondary | ICD-10-CM | POA: Diagnosis not present

## 2014-10-22 LAB — HEPATIC FUNCTION PANEL
ALT: 34 U/L (ref 0–35)
AST: 35 U/L (ref 0–37)
Albumin: 4.2 g/dL (ref 3.5–5.2)
Alkaline Phosphatase: 142 U/L — ABNORMAL HIGH (ref 39–117)
Bilirubin, Direct: 0.2 mg/dL (ref 0.0–0.3)
Indirect Bilirubin: 0.7 mg/dL (ref 0.2–1.2)
Total Bilirubin: 0.9 mg/dL (ref 0.2–1.2)
Total Protein: 6.7 g/dL (ref 6.0–8.3)

## 2014-10-22 LAB — LIPID PANEL
Cholesterol: 125 mg/dL (ref 0–200)
HDL: 57 mg/dL (ref >=46)
LDL Cholesterol: 51 mg/dL (ref 0–99)
Total CHOL/HDL Ratio: 2.2 Ratio
Triglycerides: 85 mg/dL (ref <150)
VLDL: 17 mg/dL (ref 0–40)

## 2015-01-09 ENCOUNTER — Ambulatory Visit (INDEPENDENT_AMBULATORY_CARE_PROVIDER_SITE_OTHER): Payer: Medicare Other | Admitting: Family Medicine

## 2015-01-09 ENCOUNTER — Encounter: Payer: Self-pay | Admitting: Family Medicine

## 2015-01-09 VITALS — BP 124/69 | HR 55 | Temp 98.0°F | Ht 65.0 in | Wt 182.0 lb

## 2015-01-09 DIAGNOSIS — J019 Acute sinusitis, unspecified: Secondary | ICD-10-CM | POA: Diagnosis not present

## 2015-01-09 MED ORDER — FLUTICASONE PROPIONATE 50 MCG/ACT NA SUSP: 2.0000 | NASAL | Status: DC | PRN

## 2015-01-09 MED ORDER — AMOXICILLIN-POT CLAVULANATE 875-125 MG PO TABS: 1.0000 | ORAL_TABLET | Freq: Two times a day (BID) | ORAL | Status: DC

## 2015-01-09 NOTE — Progress Notes (Signed)
   Subjective:    Patient ID: Anne Martin, female    DOB: November 25, 1945, 69 y.o.   MRN: [id number]  HPI Here for 5 days of stuffy head, PND, ST, and a headache. No cough.    Review of Systems  Constitutional: Negative.   HENT: Positive for congestion, postnasal drip and sinus pressure.   Eyes: Negative.   Respiratory: Negative.        Objective:   Physical Exam  Constitutional: She appears well-developed and well-nourished.  HENT:  Right Ear: External ear normal.  Left Ear: External ear normal.  Nose: Nose normal.  Mouth/Throat: Oropharynx is clear and moist.  Eyes: Conjunctivae are normal.  Neck: No thyromegaly present.  Pulmonary/Chest: Effort normal and breath sounds normal.  Lymphadenopathy:    She has no cervical adenopathy.          Assessment & Plan:  Sinusitis, treat with Augmentin.

## 2015-01-09 NOTE — Progress Notes (Signed)
Pre visit review using our clinic review tool, if applicable. No additional management support is needed unless otherwise documented below in the visit note. 

## 2015-01-17 ENCOUNTER — Other Ambulatory Visit: Payer: Self-pay | Admitting: *Deleted

## 2015-01-17 MED ORDER — ISOSORBIDE MONONITRATE ER 30 MG PO TB24: 30.0000 mg | ORAL_TABLET | Freq: Every day | ORAL | Status: DC

## 2015-01-20 DIAGNOSIS — Z23 Encounter for immunization: Secondary | ICD-10-CM | POA: Diagnosis not present

## 2015-01-20 DIAGNOSIS — M8580 Other specified disorders of bone density and structure, unspecified site: Secondary | ICD-10-CM | POA: Diagnosis not present

## 2015-01-20 DIAGNOSIS — N952 Postmenopausal atrophic vaginitis: Secondary | ICD-10-CM | POA: Diagnosis not present

## 2015-01-20 DIAGNOSIS — Z01411 Encounter for gynecological examination (general) (routine) with abnormal findings: Secondary | ICD-10-CM | POA: Diagnosis not present

## 2015-01-20 DIAGNOSIS — Z1231 Encounter for screening mammogram for malignant neoplasm of breast: Secondary | ICD-10-CM | POA: Diagnosis not present

## 2015-01-20 DIAGNOSIS — Z124 Encounter for screening for malignant neoplasm of cervix: Secondary | ICD-10-CM | POA: Diagnosis not present

## 2015-01-21 DIAGNOSIS — L700 Acne vulgaris: Secondary | ICD-10-CM | POA: Diagnosis not present

## 2015-01-21 DIAGNOSIS — D2260 Melanocytic nevi of unspecified upper limb, including shoulder: Secondary | ICD-10-CM | POA: Diagnosis not present

## 2015-01-21 DIAGNOSIS — D2270 Melanocytic nevi of unspecified lower limb, including hip: Secondary | ICD-10-CM | POA: Diagnosis not present

## 2015-01-21 DIAGNOSIS — L821 Other seborrheic keratosis: Secondary | ICD-10-CM | POA: Diagnosis not present

## 2015-01-21 DIAGNOSIS — D225 Melanocytic nevi of trunk: Secondary | ICD-10-CM | POA: Diagnosis not present

## 2015-01-21 DIAGNOSIS — L814 Other melanin hyperpigmentation: Secondary | ICD-10-CM | POA: Diagnosis not present

## 2015-04-10 ENCOUNTER — Encounter: Payer: Self-pay | Admitting: *Deleted

## 2015-06-11 ENCOUNTER — Other Ambulatory Visit: Payer: Self-pay | Admitting: Cardiology

## 2015-06-11 NOTE — Telephone Encounter (Signed)
Rx(s) sent to pharmacy electronically.  

## 2015-07-16 ENCOUNTER — Telehealth: Payer: Self-pay | Admitting: Cardiology

## 2015-07-16 DIAGNOSIS — M7062 Trochanteric bursitis, left hip: Secondary | ICD-10-CM | POA: Diagnosis not present

## 2015-07-16 DIAGNOSIS — M545 Low back pain: Secondary | ICD-10-CM | POA: Diagnosis not present

## 2015-07-16 DIAGNOSIS — M5136 Other intervertebral disc degeneration, lumbar region: Secondary | ICD-10-CM | POA: Diagnosis not present

## 2015-07-16 NOTE — Telephone Encounter (Signed)
Left message for pt of dr crenshaw's recommendations  

## 2015-07-16 NOTE — Telephone Encounter (Signed)
[place] for MRI [facility]

## 2015-07-16 NOTE — Telephone Encounter (Signed)
Returned call to patient.Spoke to husband he stated wife not at home.Stated she wanted to ask Dr.Crenshaw if ok to have a MRI of her back since she has a stent in her heart.Stated she is having MRI in [place] and they will need a fax saying ok.Stated he will find out were to fax note to.Message sent to [facility].

## 2015-07-16 NOTE — Telephone Encounter (Signed)
New message      Pt got a stent 5 years ago.  She is in [place] needing an MRI.  They need her "stent" card.  She does not have one.  Please call with stent info if we do not have a copy of her stent card.

## 2015-07-16 NOTE — Telephone Encounter (Signed)
Spoke with pt, she needs the name of the stent faxed to the imaging place in [place]. Cath report faxed to [phone]

## 2015-07-18 DIAGNOSIS — M5126 Other intervertebral disc displacement, lumbar region: Secondary | ICD-10-CM | POA: Diagnosis not present

## 2015-07-18 DIAGNOSIS — M4310 Spondylolisthesis, site unspecified: Secondary | ICD-10-CM | POA: Diagnosis not present

## 2015-07-18 DIAGNOSIS — M5136 Other intervertebral disc degeneration, lumbar region: Secondary | ICD-10-CM | POA: Diagnosis not present

## 2015-07-18 DIAGNOSIS — M47817 Spondylosis without myelopathy or radiculopathy, lumbosacral region: Secondary | ICD-10-CM | POA: Diagnosis not present

## 2015-07-30 DIAGNOSIS — B372 Candidiasis of skin and nail: Secondary | ICD-10-CM | POA: Diagnosis not present

## 2015-07-30 DIAGNOSIS — K6289 Other specified diseases of anus and rectum: Secondary | ICD-10-CM | POA: Diagnosis not present

## 2015-08-06 DIAGNOSIS — B0089 Other herpesviral infection: Secondary | ICD-10-CM | POA: Diagnosis not present

## 2015-08-06 DIAGNOSIS — L03019 Cellulitis of unspecified finger: Secondary | ICD-10-CM | POA: Diagnosis not present

## 2015-08-06 DIAGNOSIS — M5136 Other intervertebral disc degeneration, lumbar region: Secondary | ICD-10-CM | POA: Diagnosis not present

## 2015-08-06 DIAGNOSIS — M545 Low back pain: Secondary | ICD-10-CM | POA: Diagnosis not present

## 2015-08-06 DIAGNOSIS — M7062 Trochanteric bursitis, left hip: Secondary | ICD-10-CM | POA: Diagnosis not present

## 2015-08-06 DIAGNOSIS — N390 Urinary tract infection, site not specified: Secondary | ICD-10-CM | POA: Diagnosis not present

## 2015-08-15 DIAGNOSIS — I2510 Atherosclerotic heart disease of native coronary artery without angina pectoris: Secondary | ICD-10-CM | POA: Diagnosis not present

## 2015-08-15 DIAGNOSIS — R110 Nausea: Secondary | ICD-10-CM | POA: Diagnosis not present

## 2015-08-15 DIAGNOSIS — R55 Syncope and collapse: Secondary | ICD-10-CM | POA: Diagnosis not present

## 2015-08-15 DIAGNOSIS — R42 Dizziness and giddiness: Secondary | ICD-10-CM | POA: Diagnosis not present

## 2015-08-15 DIAGNOSIS — E785 Hyperlipidemia, unspecified: Secondary | ICD-10-CM | POA: Diagnosis not present

## 2015-08-15 DIAGNOSIS — Z79899 Other long term (current) drug therapy: Secondary | ICD-10-CM | POA: Diagnosis not present

## 2015-08-15 DIAGNOSIS — R51 Headache: Secondary | ICD-10-CM | POA: Diagnosis not present

## 2015-08-15 DIAGNOSIS — Z955 Presence of coronary angioplasty implant and graft: Secondary | ICD-10-CM | POA: Diagnosis not present

## 2015-08-15 DIAGNOSIS — R079 Chest pain, unspecified: Secondary | ICD-10-CM | POA: Diagnosis not present

## 2015-08-15 DIAGNOSIS — R0602 Shortness of breath: Secondary | ICD-10-CM | POA: Diagnosis not present

## 2015-08-15 DIAGNOSIS — Z7982 Long term (current) use of aspirin: Secondary | ICD-10-CM | POA: Diagnosis not present

## 2015-08-16 DIAGNOSIS — R2681 Unsteadiness on feet: Secondary | ICD-10-CM | POA: Diagnosis not present

## 2015-08-16 DIAGNOSIS — R42 Dizziness and giddiness: Secondary | ICD-10-CM | POA: Diagnosis not present

## 2015-08-16 DIAGNOSIS — I2510 Atherosclerotic heart disease of native coronary artery without angina pectoris: Secondary | ICD-10-CM | POA: Diagnosis not present

## 2015-09-05 ENCOUNTER — Other Ambulatory Visit: Payer: Self-pay | Admitting: Cardiology

## 2015-09-05 NOTE — Telephone Encounter (Signed)
Rx(s) sent to pharmacy electronically.  

## 2015-09-22 ENCOUNTER — Other Ambulatory Visit: Payer: Self-pay | Admitting: Cardiology

## 2015-09-22 ENCOUNTER — Other Ambulatory Visit: Payer: Self-pay | Admitting: *Deleted

## 2015-09-22 MED ORDER — ATORVASTATIN CALCIUM 80 MG PO TABS: 80.0000 mg | ORAL_TABLET | Freq: Every day | ORAL | Status: DC

## 2015-09-22 NOTE — Telephone Encounter (Signed)
Error already filled

## 2015-10-08 ENCOUNTER — Other Ambulatory Visit: Payer: Self-pay | Admitting: Cardiology

## 2015-10-08 NOTE — Telephone Encounter (Signed)
Rx(s) sent to pharmacy electronically. Patient has MD OV 10/23/15

## 2015-10-09 ENCOUNTER — Encounter: Payer: Self-pay | Admitting: *Deleted

## 2015-10-21 NOTE — Progress Notes (Signed)
HPI: FU CAD. Cardiac catheterization was performed in May of 2011. This revealed Normal LM, 60 LAD, 90 D2, 50-60 LCx, normal RCA, and nl LV function. Patient had PCI of the diagonal with DES at that time. Carotid Dopplers in Oct 2013 were normal. Echo 9/15 showed normal LV function, grade 1 diastolic dysfunction, mild AS with mean gradient 14 mmHg, mild AI, mild MR. Nuclear study 9/15 showed EF 72 and normal perfusion. Since I last saw her, the patient denies any dyspnea on exertion, orthopnea, PND, pedal edema, palpitations, syncope or chest pain. Patient had some dizziness while in [place]. She was seen and felt to potentially have vertigo. She apparently had a stress test that was unremarkable. Records not available.  Current Outpatient Prescriptions  Medication Sig Dispense Refill  . aspirin 81 MG tablet Take 81 mg by mouth daily.      Marland Kitchen atorvastatin (LIPITOR) 80 MG tablet Take 1 tablet (80 mg total) by mouth daily at 6 PM. 90 tablet 3  . Calcium Carbonate (CALCIUM 600) 1500 MG TABS Take 1 tablet by mouth once.     . cholecalciferol (VITAMIN D) 1000 UNITS tablet Take 1,000 Units by mouth daily.    Marland Kitchen estradiol (VAGIFEM) 25 MCG vaginal tablet Place 25 mcg vaginally daily. Twice weekly    . fluticasone (FLONASE) 50 MCG/ACT nasal spray Place 2 sprays into the nose daily. AS NEEDED    . isosorbide mononitrate (IMDUR) 30 MG 24 hr tablet Take 1 tablet (30 mg total) by mouth daily. <MUST KEEP APPOINTMENT July 13> 90 tablet 0  . Loratadine (CLARITIN) 10 MG CAPS Take 1 capsule by mouth as needed. 24 hr tabs    . metoprolol succinate (TOPROL-XL) 25 MG 24 hr tablet TAKE 1 TABLET BY MOUTH EVERY DAY 90 tablet 3  . nitroGLYCERIN (NITROSTAT) 0.4 MG SL tablet Place 1 tablet (0.4 mg total) under the tongue every 5 (five) minutes as needed. 25 tablet 3   No current facility-administered medications for this visit.     Past Medical History  Diagnosis Date  . Heart murmur     mild aortic  insufficiency per echo in May of 2011, mildly elevated LVOT  . H/O: rheumatic fever   . Osteopenia     last DEXA 11-22-13   . CAD (coronary artery disease)     s/p cath after abnormal nuclear study; normal left main, 60% LAD, 90% 2nd DX, 50 -60% LCX and normal RCA with normal LV function; s/p PCI of the diagonal with DES; normal myoview in May 2012  . Hyperlipidemia   . Gallstones   . Allergy     seasonal  . History of colonoscopy   . Carotid disease, bilateral ([place])     0-39% per doppler in July of 2011  . Normal nuclear stress test May 2012    normal EF and normal perfusion.     Past Surgical History  Procedure Laterality Date  . Knee arthroscopy      Rt. Knee  . Cholecystectomy  nov 2004  . Vaginal hysterectomy  1994  . Angioplasty  may 2011    stents  . Tonsillectomy    . Colonoscopy  12-22-10    per Dr. Henrene Pastor, adenomatous polyps, repeat in 5 yrs     Social History   Social History  . Marital Status: Married    Spouse Name: N/A  . Number of Children: 2  . Years of Education: N/A   Occupational History  .  retired Pharmacist, hospital   .     Social History Main Topics  . Smoking status: Former Smoker    Types: Cigarettes    Quit date: 04/12/1977  . Smokeless tobacco: Never Used     Comment: quit age 73  . Alcohol Use: No  . Drug Use: No  . Sexual Activity: Not Currently   Other Topics Concern  . Not on file   Social History Narrative    Family History  Problem Relation Age of Onset  . Heart disease Mother   . Heart disease Father     CABG  . Heart disease Brother     2 stents  . Colon cancer Neg Hx   . Ulcerative colitis Daughter     ROS: Some dizziness but no fevers or chills, productive cough, hemoptysis, dysphasia, odynophagia, melena, hematochezia, dysuria, hematuria, rash, seizure activity, orthopnea, PND, pedal edema, claudication. Remaining systems are negative.  Physical Exam: Well-developed well-nourished in no acute distress.  Skin is warm and  dry.  HEENT is normal.  Neck is supple.  Chest is clear to auscultation with normal expansion.  Cardiovascular exam is regular rate and rhythm.  Abdominal exam nontender or distended. No masses palpated. Extremities show no edema. neuro grossly intact  ECG-Sinus rhythm at a rate of 62. No ST changes.   Assessment and plan  1 coronary artery disease-continue aspirin and statin.  2 hyperlipidemia-continue statin. Check lipids and liver.  3 aortic stenosis-plan repeat echocardiogram.  Kirk Ruths, MD

## 2015-10-23 ENCOUNTER — Ambulatory Visit (INDEPENDENT_AMBULATORY_CARE_PROVIDER_SITE_OTHER): Payer: Medicare Other | Admitting: Cardiology

## 2015-10-23 ENCOUNTER — Encounter: Payer: Self-pay | Admitting: Cardiology

## 2015-10-23 VITALS — BP 110/64 | HR 62 | Ht 65.0 in | Wt 180.0 lb

## 2015-10-23 DIAGNOSIS — I350 Nonrheumatic aortic (valve) stenosis: Secondary | ICD-10-CM | POA: Diagnosis not present

## 2015-10-23 DIAGNOSIS — I2510 Atherosclerotic heart disease of native coronary artery without angina pectoris: Secondary | ICD-10-CM | POA: Diagnosis not present

## 2015-10-23 DIAGNOSIS — E785 Hyperlipidemia, unspecified: Secondary | ICD-10-CM

## 2015-10-23 LAB — LIPID PANEL
Cholesterol: 110 mg/dL — ABNORMAL LOW (ref 125–200)
HDL: 65 mg/dL (ref >=46)
LDL Cholesterol: 27 mg/dL (ref <130)
Total CHOL/HDL Ratio: 1.7 Ratio (ref <=5.0)
Triglycerides: 90 mg/dL (ref <150)
VLDL: 18 mg/dL (ref <30)

## 2015-10-23 LAB — HEPATIC FUNCTION PANEL
ALT: 22 U/L (ref 6–29)
AST: 26 U/L (ref 10–35)
Albumin: 4.3 g/dL (ref 3.6–5.1)
Alkaline Phosphatase: 134 U/L — ABNORMAL HIGH (ref 33–130)
Bilirubin, Direct: 0.3 mg/dL — ABNORMAL HIGH (ref <=0.2)
Indirect Bilirubin: 0.8 mg/dL (ref 0.2–1.2)
Total Bilirubin: 1.1 mg/dL (ref 0.2–1.2)
Total Protein: 6.5 g/dL (ref 6.1–8.1)

## 2015-10-23 NOTE — Patient Instructions (Signed)
Medication Instructions:   NO CHANGE  Labwork:  Your physician recommends that you HAVE LAB WORK TODAY  Testing/Procedures:  Your physician has requested that you have an echocardiogram. Echocardiography is a painless test that uses sound waves to create images of your heart. It provides your doctor with information about the size and shape of your heart and how well your heart's chambers and valves are working. This procedure takes approximately one hour. There are no restrictions for this procedure.    Follow-Up:  Your physician wants you to follow-up in: ONE YEAR WITH DR CRENSHAW You will receive a reminder letter in the mail two months in advance. If you don't receive a letter, please call our office to schedule the follow-up appointment.   If you need a refill on your cardiac medications before your next appointment, please call your pharmacy.    

## 2015-11-10 ENCOUNTER — Other Ambulatory Visit (HOSPITAL_COMMUNITY): Payer: Self-pay

## 2015-11-10 ENCOUNTER — Ambulatory Visit (HOSPITAL_COMMUNITY): Payer: Medicare Other | Attending: Cardiovascular Disease

## 2015-11-10 ENCOUNTER — Encounter (INDEPENDENT_AMBULATORY_CARE_PROVIDER_SITE_OTHER): Payer: Self-pay

## 2015-11-10 DIAGNOSIS — I352 Nonrheumatic aortic (valve) stenosis with insufficiency: Secondary | ICD-10-CM | POA: Diagnosis not present

## 2015-11-10 DIAGNOSIS — I2510 Atherosclerotic heart disease of native coronary artery without angina pectoris: Secondary | ICD-10-CM | POA: Insufficient documentation

## 2015-11-10 DIAGNOSIS — E785 Hyperlipidemia, unspecified: Secondary | ICD-10-CM | POA: Insufficient documentation

## 2015-11-10 DIAGNOSIS — Z87891 Personal history of nicotine dependence: Secondary | ICD-10-CM | POA: Diagnosis not present

## 2015-11-10 DIAGNOSIS — I350 Nonrheumatic aortic (valve) stenosis: Secondary | ICD-10-CM

## 2015-11-11 ENCOUNTER — Telehealth: Payer: Self-pay | Admitting: Cardiology

## 2015-11-11 NOTE — Telephone Encounter (Signed)
Anne Martin is calling because Dr. Stanford Breed wanted the results of a test she had done in [place] . She said you can request them from [facility] and the fax no# is [phone]  Thanks

## 2015-11-12 ENCOUNTER — Telehealth: Payer: Self-pay | Admitting: Cardiology

## 2015-11-12 NOTE — Telephone Encounter (Signed)
Release of information given to medical records to get records.

## 2015-11-12 NOTE — Telephone Encounter (Signed)
Faxed Release signed by patient to [facility] to obtain records per Dr Jacalyn Lefevre request.  Faxed 11/12/15. lp

## 2015-11-13 ENCOUNTER — Telehealth: Payer: Self-pay | Admitting: Cardiology

## 2015-11-13 ENCOUNTER — Ambulatory Visit (INDEPENDENT_AMBULATORY_CARE_PROVIDER_SITE_OTHER): Payer: Medicare Other | Admitting: Family Medicine

## 2015-11-13 ENCOUNTER — Encounter: Payer: Self-pay | Admitting: Family Medicine

## 2015-11-13 VITALS — BP 109/66 | HR 58 | Temp 97.7°F | Ht 65.0 in | Wt 179.0 lb

## 2015-11-13 DIAGNOSIS — I350 Nonrheumatic aortic (valve) stenosis: Secondary | ICD-10-CM | POA: Diagnosis not present

## 2015-11-13 DIAGNOSIS — I2510 Atherosclerotic heart disease of native coronary artery without angina pectoris: Secondary | ICD-10-CM | POA: Diagnosis not present

## 2015-11-13 DIAGNOSIS — Z23 Encounter for immunization: Secondary | ICD-10-CM

## 2015-11-13 DIAGNOSIS — E785 Hyperlipidemia, unspecified: Secondary | ICD-10-CM

## 2015-11-13 LAB — POC URINALSYSI DIPSTICK (AUTOMATED)
Bilirubin, UA: NEGATIVE
Glucose, UA: NEGATIVE
Ketones, UA: NEGATIVE
Leukocytes, UA: NEGATIVE
Nitrite, UA: NEGATIVE
Protein, UA: NEGATIVE
Spec Grav, UA: 1.025
Urobilinogen, UA: 0.2
pH, UA: 5

## 2015-11-13 LAB — TSH: TSH: 1.61 u[IU]/mL (ref 0.35–4.50)

## 2015-11-13 LAB — BASIC METABOLIC PANEL
BUN: 13 mg/dL (ref 6–23)
CO2: 31 mEq/L (ref 19–32)
Calcium: 10.1 mg/dL (ref 8.4–10.5)
Chloride: 104 mEq/L (ref 96–112)
Creatinine, Ser: 0.76 mg/dL (ref 0.40–1.20)
GFR: 79.8 mL/min (ref >60.00)
Glucose, Bld: 93 mg/dL (ref 70–99)
Potassium: 4.6 mEq/L (ref 3.5–5.1)
Sodium: 141 mEq/L (ref 135–145)

## 2015-11-13 LAB — CBC WITH DIFFERENTIAL/PLATELET
Basophils Absolute: 0 10*3/uL (ref 0.0–0.1)
Basophils Relative: 0.4 % (ref 0.0–3.0)
Eosinophils Absolute: 0.2 10*3/uL (ref 0.0–0.7)
Eosinophils Relative: 2 % (ref 0.0–5.0)
HCT: 45.5 % (ref 36.0–46.0)
Hemoglobin: 14.9 g/dL (ref 12.0–15.0)
Lymphocytes Relative: 26.5 % (ref 12.0–46.0)
Lymphs Abs: 2.2 10*3/uL (ref 0.7–4.0)
MCHC: 32.8 g/dL (ref 30.0–36.0)
MCV: 80.6 fl (ref 78.0–100.0)
Monocytes Absolute: 0.6 10*3/uL (ref 0.1–1.0)
Monocytes Relative: 7 % (ref 3.0–12.0)
Neutro Abs: 5.2 10*3/uL (ref 1.4–7.7)
Neutrophils Relative %: 64.1 % (ref 43.0–77.0)
Platelets: 232 10*3/uL (ref 150.0–400.0)
RBC: 5.64 Mil/uL — ABNORMAL HIGH (ref 3.87–5.11)
RDW: 14.8 % (ref 11.5–15.5)
WBC: 8.1 10*3/uL (ref 4.0–10.5)

## 2015-11-13 NOTE — Telephone Encounter (Signed)
Received records from [facility] as requested by Dr Stanford Breed.  Records given to Dr Stanford Breed to review. lp

## 2015-11-13 NOTE — Progress Notes (Signed)
   Subjective:    Patient ID: Anne Martin, female    DOB: 07-11-1945, 70 y.o.   MRN: [id number]  HPI 70 yr old female to follow up on several issues. She feels well in general. She recently saw her Cardiologist and had a good examination. Her BP is stable. Her ECHO showed normal systolic function and a moderate degree of aortic valve stenosis which is unchanged. Of note while spending the winter and spring in [place] she had a bout of vertigo in May. While seeing the local medical staff she had a head CT scan that was reportedly normal and a nuclear stress test that was normal.    Review of Systems  Constitutional: Negative.   HENT: Negative.   Eyes: Negative.   Respiratory: Negative.   Cardiovascular: Negative.   Gastrointestinal: Negative.   Genitourinary: Negative for decreased urine volume, difficulty urinating, dyspareunia, dysuria, enuresis, flank pain, frequency, hematuria, pelvic pain and urgency.  Musculoskeletal: Negative.   Skin: Negative.   Neurological: Negative.   Psychiatric/Behavioral: Negative.        Objective:   Physical Exam  Constitutional: She is oriented to person, place, and time. She appears well-developed and well-nourished. No distress.  HENT:  Head: Normocephalic and atraumatic.  Right Ear: External ear normal.  Left Ear: External ear normal.  Nose: Nose normal.  Mouth/Throat: Oropharynx is clear and moist. No oropharyngeal exudate.  Eyes: Conjunctivae and EOM are normal. Pupils are equal, round, and reactive to light. No scleral icterus.  Neck: Normal range of motion. Neck supple. No JVD present. No thyromegaly present.  Cardiovascular: Normal rate, regular rhythm and intact distal pulses.  Exam reveals no gallop and no friction rub.   She has her usual 2/6 SM over the left sternal margin   Pulmonary/Chest: Effort normal and breath sounds normal. No respiratory distress. She has no wheezes. She has no rales. She exhibits no tenderness.  Abdominal:  Soft. Bowel sounds are normal. She exhibits no distension and no mass. There is no tenderness. There is no rebound and no guarding.  Musculoskeletal: Normal range of motion. She exhibits no edema or tenderness.  Lymphadenopathy:    She has no cervical adenopathy.  Neurological: She is alert and oriented to person, place, and time. She has normal reflexes. No cranial nerve deficit. She exhibits normal muscle tone. Coordination normal.  Skin: Skin is warm and dry. No rash noted. No erythema.  Psychiatric: She has a normal mood and affect. Her behavior is normal. Judgment and thought content normal.          Assessment & Plan:  Her HTN and CAD and aortic stenosis are all stable. Her lipids are well managed. We will get labs today to check her renal function, glucose, and thyroid status. We discussed diet and exercise.

## 2015-11-13 NOTE — Progress Notes (Signed)
Pre visit review using our clinic review tool, if applicable. No additional management support is needed unless otherwise documented below in the visit note. 

## 2015-11-26 ENCOUNTER — Encounter: Payer: Self-pay | Admitting: Internal Medicine

## 2015-12-09 ENCOUNTER — Other Ambulatory Visit: Payer: Self-pay | Admitting: Cardiology

## 2015-12-09 DIAGNOSIS — M25561 Pain in right knee: Secondary | ICD-10-CM | POA: Diagnosis not present

## 2016-01-09 ENCOUNTER — Other Ambulatory Visit: Payer: Self-pay | Admitting: Orthopedic Surgery

## 2016-01-09 DIAGNOSIS — M25561 Pain in right knee: Secondary | ICD-10-CM

## 2016-01-18 ENCOUNTER — Ambulatory Visit
Admission: RE | Admit: 2016-01-18 | Discharge: 2016-01-18 | Disposition: A | Discharge status: Home / Self Care | Payer: Medicare Other | Source: Ambulatory Visit | Attending: Orthopedic Surgery | Admitting: Orthopedic Surgery

## 2016-01-18 DIAGNOSIS — M25561 Pain in right knee: Secondary | ICD-10-CM

## 2016-01-18 DIAGNOSIS — S8991XA Unspecified injury of right lower leg, initial encounter: Secondary | ICD-10-CM | POA: Diagnosis not present

## 2016-01-19 ENCOUNTER — Ambulatory Visit (AMBULATORY_SURGERY_CENTER): Payer: Self-pay | Admitting: *Deleted

## 2016-01-19 VITALS — Ht 65.0 in | Wt 181.0 lb

## 2016-01-19 DIAGNOSIS — Z86010 Personal history of colonic polyps: Secondary | ICD-10-CM

## 2016-01-19 DIAGNOSIS — Z23 Encounter for immunization: Secondary | ICD-10-CM | POA: Diagnosis not present

## 2016-01-19 MED ORDER — NA SULFATE-K SULFATE-MG SULF 17.5-3.13-1.6 GM/177ML PO SOLN: 1.0000 | Freq: Once | ORAL | 0 refills | Status: AC

## 2016-01-19 NOTE — Progress Notes (Signed)
No egg or soy allergy. No anesthesia problems.  No home O2.  No diet meds.  

## 2016-01-22 DIAGNOSIS — Z1231 Encounter for screening mammogram for malignant neoplasm of breast: Secondary | ICD-10-CM | POA: Diagnosis not present

## 2016-01-22 DIAGNOSIS — Z124 Encounter for screening for malignant neoplasm of cervix: Secondary | ICD-10-CM | POA: Diagnosis not present

## 2016-01-22 DIAGNOSIS — M8580 Other specified disorders of bone density and structure, unspecified site: Secondary | ICD-10-CM | POA: Diagnosis not present

## 2016-01-22 DIAGNOSIS — N952 Postmenopausal atrophic vaginitis: Secondary | ICD-10-CM | POA: Diagnosis not present

## 2016-01-28 DIAGNOSIS — M25561 Pain in right knee: Secondary | ICD-10-CM | POA: Diagnosis not present

## 2016-01-28 DIAGNOSIS — M23300 Other meniscus derangements, unspecified lateral meniscus, right knee: Secondary | ICD-10-CM | POA: Diagnosis not present

## 2016-01-28 DIAGNOSIS — S83241D Other tear of medial meniscus, current injury, right knee, subsequent encounter: Secondary | ICD-10-CM | POA: Diagnosis not present

## 2016-02-02 ENCOUNTER — Ambulatory Visit (AMBULATORY_SURGERY_CENTER): Payer: Medicare Other | Admitting: Internal Medicine

## 2016-02-02 ENCOUNTER — Encounter: Payer: Self-pay | Admitting: Internal Medicine

## 2016-02-02 VITALS — BP 122/65 | HR 59 | Temp 97.8°F | Resp 18 | Ht 65.0 in | Wt 181.0 lb

## 2016-02-02 DIAGNOSIS — Z86010 Personal history of colonic polyps: Secondary | ICD-10-CM | POA: Diagnosis present

## 2016-02-02 DIAGNOSIS — I2510 Atherosclerotic heart disease of native coronary artery without angina pectoris: Secondary | ICD-10-CM | POA: Diagnosis not present

## 2016-02-02 DIAGNOSIS — D125 Benign neoplasm of sigmoid colon: Secondary | ICD-10-CM | POA: Diagnosis not present

## 2016-02-02 DIAGNOSIS — I10 Essential (primary) hypertension: Secondary | ICD-10-CM | POA: Diagnosis not present

## 2016-02-02 HISTORY — PX: COLONOSCOPY: SHX174

## 2016-02-02 MED ORDER — SODIUM CHLORIDE 0.9 % IV SOLN: 500.0000 mL | INTRAVENOUS | Status: DC

## 2016-02-02 NOTE — Patient Instructions (Signed)
YOU HAD AN ENDOSCOPIC PROCEDURE TODAY AT THE [facility]:   Refer to the procedure report that was given to you for any specific questions about what was found during the examination.  If the procedure report does not answer your questions, please call your gastroenterologist to clarify.  If you requested that your care partner not be given the details of your procedure findings, then the procedure report has been included in a sealed envelope for you to review at your convenience later.  YOU SHOULD EXPECT: Some feelings of bloating in the abdomen. Passage of more gas than usual.  Walking can help get rid of the air that was put into your GI tract during the procedure and reduce the bloating. If you had a lower endoscopy (such as a colonoscopy or flexible sigmoidoscopy) you may notice spotting of blood in your stool or on the toilet paper. If you underwent a bowel prep for your procedure, you may not have a normal bowel movement for a few days.  Please Note:  You might notice some irritation and congestion in your nose or some drainage.  This is from the oxygen used during your procedure.  There is no need for concern and it should clear up in a day or so.  SYMPTOMS TO REPORT IMMEDIATELY:   Following lower endoscopy (colonoscopy or flexible sigmoidoscopy):  Excessive amounts of blood in the stool  Significant tenderness or worsening of abdominal pains  Swelling of the abdomen that is new, acute  Fever of 100F or higher    For urgent or emergent issues, a gastroenterologist can be reached at any hour by calling [phone].   DIET:  We do recommend a small meal at first, but then you may proceed to your regular diet.  Drink plenty of fluids but you should avoid alcoholic beverages for 24 hours.  ACTIVITY:  You should plan to take it easy for the rest of today and you should NOT DRIVE or use heavy machinery until tomorrow (because of the sedation medicines used during the test).     FOLLOW UP: Our staff will call the number listed on your records the next business day following your procedure to check on you and address any questions or concerns that you may have regarding the information given to you following your procedure. If we do not reach you, we will leave a message.  However, if you are feeling well and you are not experiencing any problems, there is no need to return our call.  We will assume that you have returned to your regular daily activities without incident.  If any biopsies were taken you will be contacted by phone or by letter within the next 1-3 weeks.  Please call us at [phone] if you have not heard about the biopsies in 3 weeks.    SIGNATURES/CONFIDENTIALITY: You and/or your care partner have signed paperwork which will be entered into your electronic medical record.  These signatures attest to the fact that that the information above on your After Visit Summary has been reviewed and is understood.  Full responsibility of the confidentiality of this discharge information lies with you and/or your care-partner.   Resume medications. Information given on polyps. 

## 2016-02-02 NOTE — Progress Notes (Signed)
Report to PACU, RN, vss, BBS= Clear.  

## 2016-02-02 NOTE — Op Note (Signed)
[facility] Patient Name: Anne Martin Procedure Date: 02/02/2016 11:03 AM MRN: [id number] Endoscopist: Docia Chuck. Henrene Pastor , MD Age: 70 Referring MD:  Date of Birth: November 17, 1945 Gender: Female Account #: [id number] Procedure:                Colonoscopy, with cold snare polypectomy x 1 Indications:              High risk colon cancer surveillance: Personal                            history of sessile serrated colon polyp (less than                            10 mm in size) with no dysplasia. Index examination                            around 2002 in [place] reported as negative. Last                            examination October 2012 with [facility] Medicines:                Monitored Anesthesia Care Procedure:                Pre-Anesthesia Assessment:                           - Prior to the procedure, a History and Physical                            was performed, and patient medications and                            allergies were reviewed. The patient's tolerance of                            previous anesthesia was also reviewed. The risks                            and benefits of the procedure and the sedation                            options and risks were discussed with the patient.                            All questions were answered, and informed consent                            was obtained. Prior Anticoagulants: The patient has                            taken no previous anticoagulant or antiplatelet                            agents. ASA Grade Assessment: II - A patient with  mild systemic disease. After reviewing the risks                            and benefits, the patient was deemed in                            satisfactory condition to undergo the procedure.                           After obtaining informed consent, the colonoscope                            was passed under direct vision. Throughout the   procedure, the patient's blood pressure, pulse, and                            oxygen saturations were monitored continuously. The                            Model PCF-[id number]) scope was introduced                            through the anus and advanced to the the cecum,                            identified by appendiceal orifice and ileocecal                            valve. The ileocecal valve, appendiceal orifice,                            and rectum were photographed. The quality of the                            bowel preparation was excellent. The colonoscopy                            was performed without difficulty. The patient                            tolerated the procedure well. The bowel preparation                            used was SUPREP. Scope In: 11:13:35 AM Scope Out: 11:27:10 AM Scope Withdrawal Time: 0 hours 10 minutes 5 seconds  Total Procedure Duration: 0 hours 13 minutes 35 seconds  Findings:                 A 3 mm polyp was found in the sigmoid colon. The                            polyp was removed with a cold snare. Resection and  retrieval were complete.                           The exam was otherwise without abnormality on                            direct and retroflexion views. Complications:            No immediate complications. Estimated blood loss:                            None. Estimated Blood Loss:     Estimated blood loss: none. Impression:               - One 3 mm polyp in the sigmoid colon, removed with                            a cold snare. Resected and retrieved.                           - The examination was otherwise normal on direct                            and retroflexion views. Recommendation:           - Repeat colonoscopy in 5-10 years for surveillance.                           - Patient has a contact number available for                            emergencies. The signs and symptoms of  potential                            delayed complications were discussed with the                            patient. Return to normal activities tomorrow.                            Written discharge instructions were provided to the                            patient.                           - Resume previous diet.                           - Continue present medications.                           - Await pathology results. Docia Chuck. Henrene Pastor, MD 02/02/2016 11:31:14 AM This report has been signed electronically.

## 2016-02-02 NOTE — Progress Notes (Signed)
Called to room to assist during endoscopic procedure.  Patient ID and intended procedure confirmed with present staff. Received instructions for my participation in the procedure from the performing physician.  

## 2016-02-03 ENCOUNTER — Telehealth: Payer: Self-pay | Admitting: *Deleted

## 2016-02-03 NOTE — Telephone Encounter (Signed)
  Follow up Call-  Call back number 02/02/2016  Post procedure Call Back phone  # [phone]  Permission to leave phone message Yes  Some recent data might be hidden     Patient questions:  Do you have a fever, pain , or abdominal swelling? No. Pain Score  0 *  Have you tolerated food without any problems? Yes.    Have you been able to return to your normal activities? Yes.    Do you have any questions about your discharge instructions: Diet   No. Medications  No. Follow up visit  No.  Do you have questions or concerns about your Care? No.  Actions: * If pain score is 4 or above: No action needed, pain <4.

## 2016-02-05 ENCOUNTER — Encounter: Payer: Self-pay | Admitting: Internal Medicine

## 2016-02-28 ENCOUNTER — Emergency Department (HOSPITAL_COMMUNITY): Payer: Medicare Other

## 2016-02-28 ENCOUNTER — Observation Stay (HOSPITAL_COMMUNITY)
Admission: EM | Admit: 2016-02-28 | Discharge: 2016-02-29 | Disposition: A | Discharge status: Home / Self Care | Payer: Medicare Other | Attending: Internal Medicine | Admitting: Internal Medicine

## 2016-02-28 ENCOUNTER — Encounter (HOSPITAL_COMMUNITY): Payer: Self-pay | Admitting: *Deleted

## 2016-02-28 DIAGNOSIS — I351 Nonrheumatic aortic (valve) insufficiency: Secondary | ICD-10-CM | POA: Diagnosis present

## 2016-02-28 DIAGNOSIS — Z8679 Personal history of other diseases of the circulatory system: Secondary | ICD-10-CM

## 2016-02-28 DIAGNOSIS — E785 Hyperlipidemia, unspecified: Secondary | ICD-10-CM

## 2016-02-28 DIAGNOSIS — R0789 Other chest pain: Secondary | ICD-10-CM | POA: Diagnosis not present

## 2016-02-28 DIAGNOSIS — Z87891 Personal history of nicotine dependence: Secondary | ICD-10-CM | POA: Insufficient documentation

## 2016-02-28 DIAGNOSIS — Z7982 Long term (current) use of aspirin: Secondary | ICD-10-CM | POA: Insufficient documentation

## 2016-02-28 DIAGNOSIS — I10 Essential (primary) hypertension: Secondary | ICD-10-CM | POA: Diagnosis present

## 2016-02-28 DIAGNOSIS — Z79899 Other long term (current) drug therapy: Secondary | ICD-10-CM | POA: Insufficient documentation

## 2016-02-28 DIAGNOSIS — I350 Nonrheumatic aortic (valve) stenosis: Secondary | ICD-10-CM

## 2016-02-28 DIAGNOSIS — I2510 Atherosclerotic heart disease of native coronary artery without angina pectoris: Secondary | ICD-10-CM | POA: Diagnosis not present

## 2016-02-28 DIAGNOSIS — R6889 Other general symptoms and signs: Secondary | ICD-10-CM | POA: Diagnosis present

## 2016-02-28 DIAGNOSIS — R079 Chest pain, unspecified: Secondary | ICD-10-CM | POA: Diagnosis not present

## 2016-02-28 DIAGNOSIS — R0602 Shortness of breath: Secondary | ICD-10-CM | POA: Diagnosis not present

## 2016-02-28 HISTORY — DX: Nonrheumatic aortic (valve) insufficiency: I35.1

## 2016-02-28 LAB — COMPREHENSIVE METABOLIC PANEL
ALT: 27 U/L (ref 14–54)
AST: 32 U/L (ref 15–41)
Albumin: 3.9 g/dL (ref 3.5–5.0)
Alkaline Phosphatase: 127 U/L — ABNORMAL HIGH (ref 38–126)
Anion gap: 7 (ref 5–15)
BUN: 12 mg/dL (ref 6–20)
CO2: 28 mmol/L (ref 22–32)
Calcium: 9.8 mg/dL (ref 8.9–10.3)
Chloride: 107 mmol/L (ref 101–111)
Creatinine, Ser: 0.72 mg/dL (ref 0.44–1.00)
GFR calc Af Amer: >60 mL/min (ref >60)
GFR calc non Af Amer: >60 mL/min (ref >60)
Glucose, Bld: 98 mg/dL (ref 65–99)
Potassium: 4.4 mmol/L (ref 3.5–5.1)
Sodium: 142 mmol/L (ref 135–145)
Total Bilirubin: 0.7 mg/dL (ref 0.3–1.2)
Total Protein: 6.5 g/dL (ref 6.5–8.1)

## 2016-02-28 LAB — CBC WITH DIFFERENTIAL/PLATELET
Basophils Absolute: 0 10*3/uL (ref 0.0–0.1)
Basophils Relative: 0 %
Eosinophils Absolute: 0.2 10*3/uL (ref 0.0–0.7)
Eosinophils Relative: 2 %
HCT: 44.1 % (ref 36.0–46.0)
Hemoglobin: 14.5 g/dL (ref 12.0–15.0)
Lymphocytes Relative: 25 %
Lymphs Abs: 1.7 10*3/uL (ref 0.7–4.0)
MCH: 26.5 pg (ref 26.0–34.0)
MCHC: 32.9 g/dL (ref 30.0–36.0)
MCV: 80.5 fL (ref 78.0–100.0)
Monocytes Absolute: 0.4 10*3/uL (ref 0.1–1.0)
Monocytes Relative: 6 %
Neutro Abs: 4.7 10*3/uL (ref 1.7–7.7)
Neutrophils Relative %: 67 %
Platelets: 214 10*3/uL (ref 150–400)
RBC: 5.48 MIL/uL — ABNORMAL HIGH (ref 3.87–5.11)
RDW: 14.6 % (ref 11.5–15.5)
WBC: 7 10*3/uL (ref 4.0–10.5)

## 2016-02-28 LAB — I-STAT TROPONIN, ED: Troponin i, poc: 0 ng/mL (ref 0.00–0.08)

## 2016-02-28 LAB — TROPONIN I
Troponin I: <0.03 ng/mL (ref <0.03)
Troponin I: <0.03 ng/mL (ref <0.03)
Troponin I: <0.03 ng/mL (ref <0.03)

## 2016-02-28 LAB — LIPASE, BLOOD: Lipase: 25 U/L (ref 11–51)

## 2016-02-28 LAB — D-DIMER, QUANTITATIVE: D-Dimer, Quant: <0.27 ug/mL-FEU (ref 0.00–0.50)

## 2016-02-28 MED ORDER — ISOSORBIDE MONONITRATE ER 30 MG PO TB24
30.0000 mg | ORAL_TABLET | Freq: Every day | ORAL | Status: DC
  Administered 2016-02-29: 30 mg via ORAL
  Filled 2016-02-28: qty 1

## 2016-02-28 MED ORDER — ONDANSETRON HCL 4 MG/2ML IJ SOLN: 4.0000 mg | Freq: Four times a day (QID) | INTRAMUSCULAR | Status: DC | PRN

## 2016-02-28 MED ORDER — METOPROLOL SUCCINATE ER 25 MG PO TB24
25.0000 mg | ORAL_TABLET | Freq: Every day | ORAL | Status: DC
  Administered 2016-02-29: 25 mg via ORAL
  Filled 2016-02-28: qty 1

## 2016-02-28 MED ORDER — GI COCKTAIL ~~LOC~~
30.0000 mL | Freq: Four times a day (QID) | ORAL | Status: DC | PRN
  Administered 2016-02-28: 30 mL via ORAL
  Filled 2016-02-28: qty 30

## 2016-02-28 MED ORDER — ACETAMINOPHEN 325 MG PO TABS: 650.0000 mg | ORAL_TABLET | ORAL | Status: DC | PRN

## 2016-02-28 MED ORDER — ASPIRIN 81 MG PO CHEW
243.0000 mg | CHEWABLE_TABLET | Freq: Once | ORAL | Status: AC
  Administered 2016-02-28: 243 mg via ORAL
  Filled 2016-02-28: qty 3

## 2016-02-28 MED ORDER — ALPRAZOLAM 0.25 MG PO TABS: 0.2500 mg | ORAL_TABLET | Freq: Two times a day (BID) | ORAL | Status: DC | PRN

## 2016-02-28 MED ORDER — ESTRADIOL 25 MCG VA TABS: 25.0000 ug | ORAL_TABLET | VAGINAL | Status: DC

## 2016-02-28 MED ORDER — ATORVASTATIN CALCIUM 80 MG PO TABS
80.0000 mg | ORAL_TABLET | Freq: Every day | ORAL | Status: DC
  Administered 2016-02-28: 80 mg via ORAL
  Filled 2016-02-28: qty 1

## 2016-02-28 MED ORDER — SODIUM CHLORIDE 0.9 % IV SOLN
INTRAVENOUS | Status: DC
  Administered 2016-02-28: 13:00:00 via INTRAVENOUS

## 2016-02-28 MED ORDER — ENOXAPARIN SODIUM 40 MG/0.4ML ~~LOC~~ SOLN
40.0000 mg | SUBCUTANEOUS | Status: DC
  Administered 2016-02-28: 40 mg via SUBCUTANEOUS
  Filled 2016-02-28 (×2): qty 0.4

## 2016-02-28 MED ORDER — ASPIRIN EC 81 MG PO TBEC
81.0000 mg | DELAYED_RELEASE_TABLET | Freq: Every day | ORAL | Status: DC
  Administered 2016-02-29: 81 mg via ORAL
  Filled 2016-02-28: qty 1

## 2016-02-28 MED ORDER — ZOLPIDEM TARTRATE 5 MG PO TABS: 5.0000 mg | ORAL_TABLET | Freq: Every evening | ORAL | Status: DC | PRN

## 2016-02-28 MED ORDER — BACLOFEN 10 MG PO TABS
10.0000 mg | ORAL_TABLET | Freq: Three times a day (TID) | ORAL | Status: DC | PRN
  Administered 2016-02-28: 10 mg via ORAL
  Filled 2016-02-28 (×2): qty 1

## 2016-02-28 MED ORDER — MORPHINE SULFATE (PF) 4 MG/ML IV SOLN: 2.0000 mg | INTRAVENOUS | Status: DC | PRN

## 2016-02-28 NOTE — ED Notes (Signed)
Pt being taken to [facility]

## 2016-02-28 NOTE — ED Triage Notes (Signed)
Pt reports mid chest pain that radiates into her back x 1 week. Has sob with exertion. Reports cardiac hx. ekg done at triage.

## 2016-02-28 NOTE — H&P (Signed)
History and Physical    Anne Martin [id number] DOB: Apr 03, 1946 DOA: 02/28/2016   PCP: Laurey Morale, MD   Patient coming from:  Home    Chief Complaint: Chest Pain   HPI: Anne Martin is a 70 y.o. female with medical history significant for CAD status post stent, hyperlipidemia, hypertension, anemia, presenting with intermittent chest pain over the last week. The patient initially believed this pain to be musculoskeletal, trying Advil without significant relief. Her chest pain slightly radiates to the right. Pain is worse with movement and palpation. The pain radiates as well to do shoulder and to her back, feeling like a fist pressing. At times, this pain results and then reemerges with no warning. She does andit to picking up heavy boxes over the last week . Denies diaphoresis with radiation to the left jaw or left arm . Denies any dizziness or falls. No syncope or presyncope.  Reports some acute on chronic dyspnea, worse on exertion. Denies any cough. Denies any fever or chills. Denies any nausea, vomiting or abdominal pain. Appetite is normal . Denies any leg swelling or calf pain. Denies any headaches or vision changes. Denies any seizures No confusion reported. Last Cardiac cath was on May 2011. She had a recent long distance trip to [place] by Car and returning by plane.  Denies any new stressors. No new meds. She is on vaginal hormonal therapy.  No new herbal supplements. No tobacco . ETOH or recreational drugs. She was last seen by cardiology on oh was of 2017.   ED Course:  BP 137/69   Pulse 60   Temp 97.5 F (36.4 C) (Oral)   Resp 12   Ht 5\' 5"  (1.651 m)   Wt 82.1 kg (181 lb)   SpO2 98%   BMI 30.12 kg/m    Osats normal in RA troponin is negative EKG with normal sinus free them, no ACS. White count 7 hemoglobin 14.5 platelets 214 last 2-D echo on July 2017, with normal systolic function, EF 55 to 60%, normal wall function, grade 1 diastolic dysfunction.  Review of  Systems: As per HPI otherwise 10 point review of systems negative.   Past Medical History:  Diagnosis Date  . Allergy    seasonal  . Anemia    prior to hysterectomy, fibroids  . Aortic stenosis   . Blood transfusion without reported diagnosis    with hysterectomy  . CAD (coronary artery disease)    s/p cath after abnormal nuclear study; normal left main, 60% LAD, 90% 2nd DX, 50 -60% LCX and normal RCA with normal LV function; s/p PCI of the diagonal with DES; normal myoview in May 2012  . Carotid disease, bilateral ([place])    0-39% per doppler in July of 2011  . Gallstones   . H/O: rheumatic fever   . Heart murmur    mild aortic insufficiency per echo in May of 2011, mildly elevated LVOT  . History of colonoscopy   . Hyperlipidemia   . Hypertension   . Normal nuclear stress test May 2012   normal EF and normal perfusion.   . Osteopenia    last DEXA 11-22-13     Past Surgical History:  Procedure Laterality Date  . ANGIOPLASTY  may 2011   stents  . CHOLECYSTECTOMY  nov 2004  . COLONOSCOPY  12-22-10   per Dr. Henrene Pastor, adenomatous polyps, repeat in 5 yrs   . KNEE ARTHROSCOPY     Rt. Knee  . TONSILLECTOMY    .  VAGINAL HYSTERECTOMY  1994    Social History Social History   Social History  . Marital status: Married    Spouse name: N/A  . Number of children: 2  . Years of education: N/A   Occupational History  . retired Pharmacist, hospital   .  Retired   Social History Main Topics  . Smoking status: Former Smoker    Types: Cigarettes    Quit date: 04/12/1977  . Smokeless tobacco: Never Used     Comment: quit age 18  . Alcohol use 0.0 oz/week     Comment: rarely  . Drug use: No  . Sexual activity: Not Currently   Other Topics Concern  . Not on file   Social History Narrative  . No narrative on file     Allergies  Allergen Reactions  . Procaine Hcl Other (See Comments)    Unknown    Family History  Problem Relation Age of Onset  . Heart disease Mother   . Heart  disease Father     CABG  . Heart disease Brother     2 stents  . Ulcerative colitis Daughter   . Colon cancer Neg Hx       Prior to Admission medications   Medication Sig Start Date End Date Taking? Authorizing Provider  aspirin 81 MG tablet Take 81 mg by mouth daily.     Yes Historical Provider, MD  atorvastatin (LIPITOR) 80 MG tablet Take 1 tablet (80 mg total) by mouth daily at 6 PM. 09/22/15  Yes Lelon Perla, MD  Calcium Carbonate (CALCIUM 600) 1500 MG TABS Take 600 mg of elemental calcium by mouth daily.    Yes Historical Provider, MD  cholecalciferol (VITAMIN D) 1000 UNITS tablet Take 1,000 Units by mouth daily.   Yes Historical Provider, MD  estradiol (VAGIFEM) 25 MCG vaginal tablet Place 25 mcg vaginally 2 (two) times a week.    Yes Historical Provider, MD  isosorbide mononitrate (IMDUR) 30 MG 24 hr tablet Take 1 tablet (30 mg total) by mouth daily. <MUST KEEP APPOINTMENT July 13> 10/08/15  Yes Lelon Perla, MD  Loratadine (CLARITIN) 10 MG CAPS Take 10 mg by mouth daily. 24 hr tabs   Yes Historical Provider, MD  metoprolol succinate (TOPROL-XL) 25 MG 24 hr tablet TAKE 1 TABLET BY MOUTH EVERY DAY Patient taking differently: TAKE 25 MG BY MOUTH EVERY DAY 12/09/15  Yes Lelon Perla, MD  fluticasone (FLONASE) 50 MCG/ACT nasal spray Place 2 sprays into both nostrils daily as needed for allergies. AS NEEDED    Historical Provider, MD  nitroGLYCERIN (NITROSTAT) 0.4 MG SL tablet Place 1 tablet (0.4 mg total) under the tongue every 5 (five) minutes as needed. 12/20/13   Burtis Junes, NP    Physical Exam:    Vitals:   02/28/16 1000 02/28/16 1015 02/28/16 1030 02/28/16 1130  BP: 112/97 112/59 116/81 137/69  Pulse: (!) 59 (!) 55 (!) 56 60  Resp: 15 13 (!) 9 12  Temp:      TempSrc:      SpO2: (!) 88% 96% 97% 98%  Weight:      Height:           Constitutional: NAD, calm, comfortable  Vitals:   02/28/16 1000 02/28/16 1015 02/28/16 1030 02/28/16 1130  BP: 112/97  112/59 116/81 137/69  Pulse: (!) 59 (!) 55 (!) 56 60  Resp: 15 13 (!) 9 12  Temp:      TempSrc:  SpO2: (!) 88% 96% 97% 98%  Weight:      Height:       Eyes: PERRL, lids and conjunctivae normal ENMT: Mucous membranes are moist. Posterior pharynx clear of any exudate or lesions.Normal dentition.  Neck: normal, supple, no masses, no thyromegaly Respiratory: clear to auscultation bilaterally, no wheezing, no crackles. Normal respiratory effort. No accessory muscle use.  Cardiovascular: Regular rate and rhythm, 2/6 systolic LUSB no rubs / gallops. No extremity edema. 2+ pedal pulses. No carotid bruits.  Abdomen: no tenderness, no masses palpated. No hepatosplenomegaly. Bowel sounds positive.  Musculoskeletal: no clubbing / cyanosis. No joint deformity upper and lower extremities. Good ROM, no contractures. Normal muscle tone. Anterior chest pain is reproducible with very light palpation, appears musculoskeletal. Skin: no rashes, very sun exposed  Neurologic: CN 2-12 grossly intact. Sensation intact, DTR normal. Strength 5/5 in all 4.  Psychiatric: Normal judgment and insight. Alert and oriented x 3anxious mood.     Labs on Admission: I have personally reviewed following labs and imaging studies  CBC:  Recent Labs Lab 02/28/16 0835  WBC 7.0  NEUTROABS 4.7  HGB 14.5  HCT 44.1  MCV 80.5  PLT [id number]    Basic Metabolic Panel:  Recent Labs Lab 02/28/16 0835  NA 142  K 4.4  CL 107  CO2 28  GLUCOSE 98  BUN 12  CREATININE 0.72  CALCIUM 9.8    GFR: Estimated Creatinine Clearance: 68.2 mL/min (by C-G formula based on SCr of 0.72 mg/dL).  Liver Function Tests:  Recent Labs Lab 02/28/16 0835  AST 32  ALT 27  ALKPHOS 127*  BILITOT 0.7  PROT 6.5  ALBUMIN 3.9    Recent Labs Lab 02/28/16 0835  LIPASE 25   No results for input(s): AMMONIA in the last 168 hours.  Coagulation Profile: No results for input(s): INR, PROTIME in the last 168 hours.  Cardiac  Enzymes: No results for input(s): CKTOTAL, CKMB, CKMBINDEX, TROPONINI in the last 168 hours.  BNP (last 3 results) No results for input(s): PROBNP in the last 8760 hours.  HbA1C: No results for input(s): HGBA1C in the last 72 hours.  CBG: No results for input(s): GLUCAP in the last 168 hours.  Lipid Profile: No results for input(s): CHOL, HDL, LDLCALC, TRIG, CHOLHDL, LDLDIRECT in the last 72 hours.  Thyroid Function Tests: No results for input(s): TSH, T4TOTAL, FREET4, T3FREE, THYROIDAB in the last 72 hours.  Anemia Panel: No results for input(s): VITAMINB12, FOLATE, FERRITIN, TIBC, IRON, RETICCTPCT in the last 72 hours.  Urine analysis:    Component Value Date/Time   COLORURINE yellow 12/10/2008 0902   APPEARANCEUR Clear 12/10/2008 0902   LABSPEC >=1.030 12/10/2008 0902   PHURINE 5.0 12/10/2008 0902   HGBUR trace-lysed 12/10/2008 0902   BILIRUBINUR n 11/13/2015 1118   PROTEINUR n 11/13/2015 1118   UROBILINOGEN 0.2 11/13/2015 1118   UROBILINOGEN 0.2 12/10/2008 0902   NITRITE n 11/13/2015 1118   NITRITE negative 12/10/2008 0902   LEUKOCYTESUR Negative 11/13/2015 1118    Sepsis Labs: @LABRCNTIP (procalcitonin:4,lacticidven:4) )No results found for this or any previous visit (from the past 240 hour(s)).   Radiological Exams on Admission: Dg Chest 2 View  Result Date: 02/28/2016 CLINICAL DATA:  Chest pain and shortness of breath for 1 week. EXAM: CHEST  2 VIEW COMPARISON:  09/03/2009 chest radiograph FINDINGS: The cardiomediastinal silhouette is unremarkable. Coronary artery stent again noted. Mild chronic peribronchial thickening again noted. There is no evidence of focal airspace disease, pulmonary edema, suspicious pulmonary nodule/mass,  pleural effusion, or pneumothorax. No acute bony abnormalities are identified. IMPRESSION: No active cardiopulmonary disease. Electronically Signed   By: Margarette Canada M.D.   On: 02/28/2016 11:02    EKG: Independently reviewed.   Assessment/Plan Active Problems:   Chest pain   Hyperlipidemia   CAD, NATIVE VESSEL   History of cardiovascular disorder   Somatic complaints, multiple   Chest pain syndrome, cardiac versus musculoskeletal vs anxiety HEART score 4-5. Troponin 0, EKG without evidence of ACS CXR unrevealing.Last Cath with stent in 2011 for native stent disease .   Admit to Telemetry/ Observation Chest pain order set Cycle troponins EKG in am continue ASA, O2 and NTG as needed, continue Beta blockers and nitrates  Statins  Check Lipid panel  Hb A1C D dimer  Consult to Cards in am    Hypertension BP 137/69   Pulse 60    Controlled Continue home anti-hypertensive medications    Hyperlipidemia Continue home statins   DVT prophylaxis: Lovenox  Code Status:   Full   Family Communication:  Discussed with patient Disposition Plan: Expect patient to be discharged to home after condition improves Consults called:    Cards in am  Admission status:Tele  Obs    Tc Kapusta E, PA-C [facility]   02/28/2016, 12:04 PM

## 2016-02-28 NOTE — ED Provider Notes (Signed)
Emergency Department Provider Note   I have reviewed the triage vital signs and the nursing notes.   HISTORY  Chief Complaint Chest Pain   HPI Anne Martin is a 70 y.o. female with PMH of CAD s/p stenting, HLD, HTN, and anemia presents to the emergency department for evaluation of chest soreness and pressure intermittently over the last week. Patient states that she is moving and initially saturating her symptoms to muscle soreness. She has tried resting along with Advil but this does not seem to give her any relief. She has soreness in her chest anteriorly which is slightly radiating to the right. She reports this pain is worse with movement and palpation. Patient has a secondary discomfort in her back and shoulder she states feels like a fist pressing. The patient became more concerned when the symptoms seemed to resolve spontaneously and then reemerge with sometimes no exertion.   Past Medical History:  Diagnosis Date  . Allergy    seasonal  . Anemia    prior to hysterectomy, fibroids  . Aortic stenosis   . Blood transfusion without reported diagnosis    with hysterectomy  . CAD (coronary artery disease)    s/p cath after abnormal nuclear study; normal left main, 60% LAD, 90% 2nd DX, 50 -60% LCX and normal RCA with normal LV function; s/p PCI of the diagonal with DES; normal myoview in May 2012  . Carotid disease, bilateral ([place])    0-39% per doppler in July of 2011  . Gallstones   . H/O: rheumatic fever   . Heart murmur    mild aortic insufficiency per echo in May of 2011, mildly elevated LVOT  . History of colonoscopy   . Hyperlipidemia   . Hypertension   . Normal nuclear stress test May 2012   normal EF and normal perfusion.   . Osteopenia    last DEXA 11-22-13     Patient Active Problem List   Diagnosis Date Noted  . Chest pain 02/28/2016  . Aortic stenosis 02/24/2012  . Somatic complaints, multiple 09/21/2011  . Hyperlipidemia 11/07/2009  . CAD, NATIVE  VESSEL 09/15/2009  . CAROTID BRUIT 09/15/2009  . CHEST PAIN-PRECORDIAL 09/15/2009  . ARM PAIN 08/22/2009  . CHEST PAIN 08/22/2009  . CONTACT DERMATITIS 01/28/2009  . Disorder of bone and cartilage 12/17/2008  . ALLERGIC RHINITIS 08/16/2007  . KNEE PAIN 08/16/2007  . History of cardiovascular disorder 08/16/2007    Past Surgical History:  Procedure Laterality Date  . ANGIOPLASTY  may 2011   stents  . CHOLECYSTECTOMY  nov 2004  . COLONOSCOPY  12-22-10   per Dr. Henrene Pastor, adenomatous polyps, repeat in 5 yrs   . KNEE ARTHROSCOPY     Rt. Knee  . TONSILLECTOMY    . VAGINAL HYSTERECTOMY  1994      Allergies Procaine hcl  Family History  Problem Relation Age of Onset  . Heart disease Mother   . Heart disease Father     CABG  . Heart disease Brother     2 stents  . Ulcerative colitis Daughter   . Colon cancer Neg Hx     Social History Social History  Substance Use Topics  . Smoking status: Former Smoker    Types: Cigarettes    Quit date: 04/12/1977  . Smokeless tobacco: Never Used     Comment: quit age 7  . Alcohol use 0.0 oz/week     Comment: rarely    Review of Systems  Constitutional: No fever/chills  Eyes: No visual changes. ENT: No sore throat. Cardiovascular: Positive chest pain. Respiratory: Denies shortness of breath. Gastrointestinal: No abdominal pain.  No nausea, no vomiting.  No diarrhea.  No constipation. Genitourinary: Negative for dysuria. Musculoskeletal: Negative for back pain. Skin: Negative for rash. Neurological: Negative for headaches, focal weakness or numbness.  10-point ROS otherwise negative.  ____________________________________________   PHYSICAL EXAM:  VITAL SIGNS: ED Triage Vitals  Enc Vitals Group     BP 02/28/16 0809 142/74     Pulse Rate 02/28/16 0809 65     Resp 02/28/16 0809 18     Temp 02/28/16 0809 97.5 F (36.4 C)     Temp Source 02/28/16 0809 Oral     SpO2 02/28/16 0809 97 %     Weight 02/28/16 0811 181 lb (82.1  kg)     Height 02/28/16 0811 5\' 5"  (1.651 m)     Pain Score 02/28/16 0808 8   Constitutional: Alert and oriented. Well appearing and in no acute distress. Eyes: Conjunctivae are normal.  Head: Atraumatic. Nose: No congestion/rhinnorhea. Mouth/Throat: Mucous membranes are moist.  Oropharynx non-erythematous. Neck: No stridor.  Cardiovascular: Normal rate, regular rhythm. Good peripheral circulation. 3/6 systolic murmur appreciated.  Respiratory: Normal respiratory effort.  No retractions. Lungs CTAB. Gastrointestinal: Soft and nontender. No distention.  Musculoskeletal: No lower extremity tenderness nor edema. No gross deformities of extremities. Neurologic:  Normal speech and language. No gross focal neurologic deficits are appreciated.  Skin:  Skin is warm, dry and intact. No rash noted.   ____________________________________________   LABS (all labs ordered are listed, but only abnormal results are displayed)  Labs Reviewed  COMPREHENSIVE METABOLIC PANEL - Abnormal; Notable for the following:       Result Value   Alkaline Phosphatase 127 (*)    All other components within normal limits  CBC WITH DIFFERENTIAL/PLATELET - Abnormal; Notable for the following:    RBC 5.48 (*)    All other components within normal limits  LIPASE, BLOOD  TROPONIN I  TROPONIN I  D-DIMER, QUANTITATIVE (NOT AT [facility])  TROPONIN I  HEMOGLOBIN A1C  LIPID PANEL  I-STAT TROPOININ, ED   ____________________________________________  EKG   EKG Interpretation  Date/Time:  Saturday February 28 2016 08:07:48 EST Ventricular Rate:  66 PR Interval:  146 QRS Duration: 80 QT Interval:  386 QTC Calculation: 404 R Axis:   83 Text Interpretation:  Normal sinus rhythm Biatrial enlargement Abnormal ECG No STEMI.  Confirmed by LONG MD, JOSHUA [phone]) on 02/28/2016 8:11:26 AM       ____________________________________________  RADIOLOGY  Dg Chest 2 View  Result Date: 02/28/2016 CLINICAL DATA:  Chest  pain and shortness of breath for 1 week. EXAM: CHEST  2 VIEW COMPARISON:  09/03/2009 chest radiograph FINDINGS: The cardiomediastinal silhouette is unremarkable. Coronary artery stent again noted. Mild chronic peribronchial thickening again noted. There is no evidence of focal airspace disease, pulmonary edema, suspicious pulmonary nodule/mass, pleural effusion, or pneumothorax. No acute bony abnormalities are identified. IMPRESSION: No active cardiopulmonary disease. Electronically Signed   By: Margarette Canada M.D.   On: 02/28/2016 11:02    ____________________________________________   PROCEDURES  Procedure(s) performed:   Procedures  None ____________________________________________   INITIAL IMPRESSION / ASSESSMENT AND PLAN / ED COURSE  Pertinent labs & imaging results that were available during my care of the patient were reviewed by me and considered in my medical decision making (see chart for details).  Patient resents emergency department for evaluation of chest discomfort that has  been intermittent over the last week. She has a history of coronary artery disease and stenting. Her anterior chest pain is reproducible to even very light palpation and seems musculoskeletal. We have some concern about the pressure sensation in her back and shoulder. EKG shows no acute ischemic changes. Plan for troponin and other basic labs along with chest x-ray and reassess. HEART score 5 (EKG, age, and CAD history).   11:18 AM Reassessed patient. No additional pressure in her back or shoulder. Troponin, chest x-ray, and EKG are initially unremarkable. Plan for admission for serial enzymes and stress testing.  ____________________________________________  FINAL CLINICAL IMPRESSION(S) / ED DIAGNOSES  Final diagnoses:  Nonspecific chest pain     MEDICATIONS GIVEN DURING THIS VISIT:  Medications  metoprolol succinate (TOPROL-XL) 24 hr tablet 25 mg (25 mg Oral Not Given 02/28/16 1214)  isosorbide  mononitrate (IMDUR) 24 hr tablet 30 mg (30 mg Oral Not Given 02/28/16 1215)  atorvastatin (LIPITOR) tablet 80 mg (not administered)  aspirin EC tablet 81 mg (81 mg Oral Not Given 02/28/16 1209)  morphine 4 MG/ML injection 2 mg (not administered)  gi cocktail (Maalox,Lidocaine,Donnatal) (30 mLs Oral Given 02/28/16 1618)  acetaminophen (TYLENOL) tablet 650 mg (not administered)  ondansetron (ZOFRAN) injection 4 mg (not administered)  enoxaparin (LOVENOX) injection 40 mg (not administered)  ALPRAZolam (XANAX) tablet 0.25 mg (not administered)  zolpidem (AMBIEN) tablet 5 mg (not administered)  baclofen (LIORESAL) tablet 10 mg (not administered)  aspirin chewable tablet 243 mg (243 mg Oral Given 02/28/16 0849)     NEW OUTPATIENT MEDICATIONS STARTED DURING THIS VISIT:  None   Note:  This document was prepared using Dragon voice recognition software and may include unintentional dictation errors.  Nanda Quinton, MD Emergency Medicine  Margette Fast, MD 02/28/16 [phone]

## 2016-02-28 NOTE — ED Notes (Signed)
Dinner tray ordered, heart healthy diet 

## 2016-02-28 NOTE — ED Notes (Signed)
Phlebotomy at bedside.

## 2016-02-28 NOTE — ED Notes (Signed)
MD Long at the bedside  

## 2016-02-28 NOTE — ED Notes (Signed)
Called Lab to Add on [facility]

## 2016-02-28 NOTE — ED Notes (Signed)
Ordered pts meal tray

## 2016-02-28 NOTE — ED Notes (Signed)
Called report to [facility] on 3W.

## 2016-02-28 NOTE — ED Notes (Signed)
Pt taken to Xray.

## 2016-02-28 NOTE — ED Notes (Signed)
Admitting MD at the bedside.  

## 2016-02-28 NOTE — ED Notes (Signed)
MD Long at the bedside with patient

## 2016-02-28 NOTE — ED Notes (Signed)
Attempted to call report to 3W. Nurse Ermalinda Barrios currently in a pts room. Number noted for callback.

## 2016-02-28 NOTE — ED Notes (Signed)
MD Long at bedside

## 2016-02-29 ENCOUNTER — Encounter (HOSPITAL_COMMUNITY): Payer: Self-pay | Admitting: Physician Assistant

## 2016-02-29 DIAGNOSIS — I10 Essential (primary) hypertension: Secondary | ICD-10-CM | POA: Diagnosis present

## 2016-02-29 DIAGNOSIS — I2510 Atherosclerotic heart disease of native coronary artery without angina pectoris: Secondary | ICD-10-CM

## 2016-02-29 DIAGNOSIS — I351 Nonrheumatic aortic (valve) insufficiency: Secondary | ICD-10-CM | POA: Diagnosis present

## 2016-02-29 DIAGNOSIS — Z8679 Personal history of other diseases of the circulatory system: Secondary | ICD-10-CM

## 2016-02-29 DIAGNOSIS — R0789 Other chest pain: Secondary | ICD-10-CM | POA: Diagnosis not present

## 2016-02-29 DIAGNOSIS — I060 Rheumatic aortic stenosis: Secondary | ICD-10-CM | POA: Diagnosis not present

## 2016-02-29 LAB — LIPID PANEL
Cholesterol: 111 mg/dL (ref 0–200)
HDL: 55 mg/dL (ref >40)
LDL Cholesterol: 43 mg/dL (ref 0–99)
Total CHOL/HDL Ratio: 2 RATIO
Triglycerides: 65 mg/dL (ref <150)
VLDL: 13 mg/dL (ref 0–40)

## 2016-02-29 LAB — HEMOGLOBIN A1C
Hgb A1c MFr Bld: 5.3 % (ref 4.8–5.6)
Mean Plasma Glucose: 105 mg/dL

## 2016-02-29 MED ORDER — IBUPROFEN 800 MG PO TABS
800.0000 mg | ORAL_TABLET | Freq: Two times a day (BID) | ORAL | Status: DC
  Administered 2016-02-29: 800 mg via ORAL
  Filled 2016-02-29: qty 1

## 2016-02-29 MED ORDER — IBUPROFEN 800 MG PO TABS: 800.0000 mg | ORAL_TABLET | Freq: Three times a day (TID) | ORAL | 0 refills | Status: AC | PRN

## 2016-02-29 MED ORDER — BACLOFEN 10 MG PO TABS: 10.0000 mg | ORAL_TABLET | Freq: Three times a day (TID) | ORAL | 0 refills | Status: AC | PRN

## 2016-02-29 NOTE — Discharge Summary (Addendum)
Physician Discharge Summary  Anne Martin [id number] DOB: 1946/01/18 DOA: 02/28/2016  PCP: Laurey Morale, MD  Admit date: 02/28/2016 Discharge date: 02/29/2016  Admitted From: Home Disposition:  Home  Recommendations for Outpatient Follow-up:  1. Follow up with PCP in 1-2 weeks 2. Follow up with Cardiology. Dr. Jacalyn Lefevre office will call you for your follow up appointment. 3. Follow up on Ha1c final result.    Home Health: no  Equipment/Devices: none   Discharge Condition: stable CODE STATUS: full  Diet recommendation: heart healthy   Brief/Interim Summary: From H&P: Anne Martin is a 70 y.o. female with medical history significant for CAD status post stent, hyperlipidemia, hypertension, anemia, presenting with intermittent chest pain over the last week. The patient initially believed this pain to be musculoskeletal, trying Advil without significant relief. Her chest pain slightly radiates to the right. Pain is worse with movement and palpation. The pain radiates as well to do shoulder and to her back, feeling like a fist pressing. At times, this pain results and then reemerges with no warning. She does andit to picking up heavy boxes over the last week . Denies diaphoresis with radiation to the left jaw or left arm . Denies any dizziness or falls. No syncope or presyncope.  Reports some acute on chronic dyspnea, worse on exertion. Denies any cough. Denies any fever or chills. Denies any nausea, vomiting or abdominal pain. Appetite is normal . Denies any leg swelling or calf pain. Denies any headaches or vision changes. Denies any seizures No confusion reported. Last Cardiac cath was on May 2011. She had a recent long distance trip to [place] by Car and returning by plane.  Denies any new stressors. No new meds. She is on vaginal hormonal therapy.  No new herbal supplements. No tobacco . ETOH or recreational drugs. She was last seen by cardiology on May of 2017.  Interim: Patient was  evaluated by cardiology. They believe that her symptoms are due to musculoskeletal etiology. Her troponins have been negative. Patient stable for discharge home with ibuprofen as well as muscle relaxant. Her chest pain did resolve with muscle relaxant during hospitalization. She should follow-up with Dr. Stanford Breed, her primary cardiologist.  Discharge Diagnoses:  Principal Problem:   Musculoskeletal chest pain Active Problems:   Hyperlipidemia   CAD, NATIVE VESSEL   History of cardiovascular disorder   Aortic stenosis   Aortic insufficiency   Essential hypertension   Discharge Instructions  Discharge Instructions    Diet - low sodium heart healthy    Complete by:  As directed    Increase activity slowly    Complete by:  As directed        Medication List    TAKE these medications   aspirin 81 MG tablet Take 81 mg by mouth daily.   atorvastatin 80 MG tablet Commonly known as:  LIPITOR Take 1 tablet (80 mg total) by mouth daily at 6 PM.   baclofen 10 MG tablet Commonly known as:  LIORESAL Take 1 tablet (10 mg total) by mouth 3 (three) times daily as needed for muscle spasms.   CALCIUM 600 1500 (600 Ca) MG Tabs tablet Generic drug:  calcium carbonate Take 600 mg of elemental calcium by mouth daily.   cholecalciferol 1000 units tablet Commonly known as:  VITAMIN D Take 1,000 Units by mouth daily.   CLARITIN 10 MG Caps Generic drug:  Loratadine Take 10 mg by mouth daily. 24 hr tabs   estradiol 25 MCG vaginal tablet Commonly  known as:  VAGIFEM Place 25 mcg vaginally 2 (two) times a week.   FLONASE 50 MCG/ACT nasal spray Generic drug:  fluticasone Place 2 sprays into both nostrils daily as needed for allergies. AS NEEDED   ibuprofen 800 MG tablet Commonly known as:  ADVIL,MOTRIN Take 1 tablet (800 mg total) by mouth every 8 (eight) hours as needed.   isosorbide mononitrate 30 MG 24 hr tablet Commonly known as:  IMDUR Take 1 tablet (30 mg total) by mouth daily.  <MUST KEEP APPOINTMENT July 13>   metoprolol succinate 25 MG 24 hr tablet Commonly known as:  TOPROL-XL TAKE 1 TABLET BY MOUTH EVERY DAY What changed:  See the new instructions.   nitroGLYCERIN 0.4 MG SL tablet Commonly known as:  NITROSTAT Place 1 tablet (0.4 mg total) under the tongue every 5 (five) minutes as needed.      Follow-up Information    Kirk Ruths, MD Follow up.   Specialty:  Cardiology Why:  Office will call you with your follow-up appointment. Contact information: [address] [place] [address] [phone]        Laurey Morale, MD. Schedule an appointment as soon as possible for a visit in 1 week(s).   Specialty:  Family Medicine Contact information: [address] [place] [address] [phone]          Allergies  Allergen Reactions  . Procaine Hcl Other (See Comments)    Unknown    Consultations:  Cardiology    Procedures/Studies: Dg Chest 2 View  Result Date: 02/28/2016 CLINICAL DATA:  Chest pain and shortness of breath for 1 week. EXAM: CHEST  2 VIEW COMPARISON:  09/03/2009 chest radiograph FINDINGS: The cardiomediastinal silhouette is unremarkable. Coronary artery stent again noted. Mild chronic peribronchial thickening again noted. There is no evidence of focal airspace disease, pulmonary edema, suspicious pulmonary nodule/mass, pleural effusion, or pneumothorax. No acute bony abnormalities are identified. IMPRESSION: No active cardiopulmonary disease. Electronically Signed   By: Margarette Canada M.D.   On: 02/28/2016 11:02      Discharge Exam: Vitals:   02/28/16 2300 02/29/16 0500  BP:  112/70  Pulse:  74  Resp: 19 15  Temp:  98.2 F (36.8 C)   Vitals:   02/28/16 1555 02/28/16 2200 02/28/16 2300 02/29/16 0500  BP: 117/65 108/76  112/70  Pulse: 64 72  74  Resp: 16 16 19 15   Temp:  98.3 F (36.8 C)  98.2 F (36.8 C)  TempSrc:  Oral    SpO2: 94% 95%  97%  Weight:      Height:          General: Pt is alert, awake, not in acute distress Cardiovascular: RRR, S1/S2 +, no rubs, no gallops, chest pain is reproducible on exam.  Respiratory: CTA bilaterally, no wheezing, no rhonchi Abdominal: Soft, NT, ND, bowel sounds + Extremities: no edema, no cyanosis    The results of significant diagnostics from this hospitalization (including imaging, microbiology, ancillary and laboratory) are listed below for reference.     Microbiology: No results found for this or any previous visit (from the past 240 hour(s)).   Labs: BNP (last 3 results) No results for input(s): BNP in the last 8760 hours. Basic Metabolic Panel:  Recent Labs Lab 02/28/16 0835  NA 142  K 4.4  CL 107  CO2 28  GLUCOSE 98  BUN 12  CREATININE 0.72  CALCIUM 9.8   Liver Function Tests:  Recent Labs Lab 02/28/16 0835  AST 32  ALT 27  ALKPHOS 127*  BILITOT 0.7  PROT 6.5  ALBUMIN 3.9    Recent Labs Lab 02/28/16 0835  LIPASE 25   No results for input(s): AMMONIA in the last 168 hours. CBC:  Recent Labs Lab 02/28/16 0835  WBC 7.0  NEUTROABS 4.7  HGB 14.5  HCT 44.1  MCV 80.5  PLT 214   Cardiac Enzymes:  Recent Labs Lab 02/28/16 1238 02/28/16 1539 02/28/16 1841  TROPONINI <0.03 <0.03 <0.03   BNP: Invalid input(s): POCBNP CBG: No results for input(s): GLUCAP in the last 168 hours. D-Dimer  Recent Labs  02/28/16 1238  DDIMER <0.27   Hgb A1c No results for input(s): HGBA1C in the last 72 hours. Lipid Profile  Recent Labs  02/29/16 0552  CHOL 111  HDL 55  LDLCALC 43  TRIG 65  CHOLHDL 2.0   Thyroid function studies No results for input(s): TSH, T4TOTAL, T3FREE, THYROIDAB in the last 72 hours.  Invalid input(s): FREET3 Anemia work up No results for input(s): VITAMINB12, FOLATE, FERRITIN, TIBC, IRON, RETICCTPCT in the last 72 hours. Urinalysis    Component Value Date/Time   COLORURINE yellow 12/10/2008 0902   APPEARANCEUR Clear 12/10/2008 0902    LABSPEC >=1.030 12/10/2008 0902   PHURINE 5.0 12/10/2008 0902   HGBUR trace-lysed 12/10/2008 0902   BILIRUBINUR n 11/13/2015 1118   PROTEINUR n 11/13/2015 1118   UROBILINOGEN 0.2 11/13/2015 1118   UROBILINOGEN 0.2 12/10/2008 0902   NITRITE n 11/13/2015 1118   NITRITE negative 12/10/2008 0902   LEUKOCYTESUR Negative 11/13/2015 1118   Sepsis Labs Invalid input(s): PROCALCITONIN,  WBC,  LACTICIDVEN Microbiology No results found for this or any previous visit (from the past 240 hour(s)).   Time coordinating discharge: Over 30 minutes  SIGNED:  Dessa Phi, DO [facility] Pager [phone]  If 7PM-7AM, please contact night-coverage [email or web address] Password [email or web address] 02/29/2016, 12:05 PM
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
## 2016-02-29 NOTE — Progress Notes (Addendum)
I have sent a message to our [facility] office's scheduler requesting a follow-up appointment, and our office will call the patient with this information.  Dayna Dunn PA-C

## 2016-03-01 ENCOUNTER — Telehealth: Payer: Self-pay

## 2016-03-01 NOTE — Telephone Encounter (Signed)
D/C 02/29/16 To: home  Spoke with pt and she states that she is doing well. No questions or concerns. Appt scheduled with Dr Sarajane Jews 03/03/16.     Transition Care Management Follow-up Telephone Call  How have you been since you were released from the hospital? good   Do you understand why you were in the hospital? yes   Do you understand the discharge instrcutions? yes  Items Reviewed:  Medications reviewed: yes  Allergies reviewed: yes  Dietary changes reviewed: yes  Referrals reviewed: yes   Functional Questionnaire:   Activities of Daily Living (ADLs):   She states they are independent in the following: ambulation, bathing and hygiene, feeding, continence, grooming, toileting and dressing States they require assistance with the following: none   Any transportation issues/concerns?: no   Any patient concerns? no   Confirmed importance and date/time of follow-up visits scheduled: yes   Confirmed with patient if condition begins to worsen call PCP or go to the ER.  Patient was given the Call-a-Nurse line [phone]: yes

## 2016-03-02 ENCOUNTER — Ambulatory Visit (INDEPENDENT_AMBULATORY_CARE_PROVIDER_SITE_OTHER): Payer: Medicare Other | Admitting: Nurse Practitioner

## 2016-03-02 ENCOUNTER — Encounter: Payer: Self-pay | Admitting: Nurse Practitioner

## 2016-03-02 VITALS — BP 133/78 | HR 56 | Ht 65.0 in | Wt 180.0 lb

## 2016-03-02 DIAGNOSIS — I25119 Atherosclerotic heart disease of native coronary artery with unspecified angina pectoris: Secondary | ICD-10-CM | POA: Diagnosis not present

## 2016-03-02 DIAGNOSIS — I2510 Atherosclerotic heart disease of native coronary artery without angina pectoris: Secondary | ICD-10-CM

## 2016-03-02 DIAGNOSIS — E782 Mixed hyperlipidemia: Secondary | ICD-10-CM | POA: Diagnosis not present

## 2016-03-02 DIAGNOSIS — R0789 Other chest pain: Secondary | ICD-10-CM | POA: Diagnosis not present

## 2016-03-02 DIAGNOSIS — I209 Angina pectoris, unspecified: Secondary | ICD-10-CM

## 2016-03-02 DIAGNOSIS — I10 Essential (primary) hypertension: Secondary | ICD-10-CM | POA: Diagnosis not present

## 2016-03-02 NOTE — Patient Instructions (Signed)
Medication Instructions:  Continue current medications  Labwork: None Ordered  Testing/Procedures: None Ordered  Follow-Up: Your physician wants you to follow-up in: 6 Months with Dr Stanford Breed. You will receive a reminder letter in the mail two months in advance. If you don't receive a letter, please call our office to schedule the follow-up appointment.   Any Other Special Instructions Will Be Listed Below (If Applicable).  It's ok to take an over the counter Pepcid   If you need a refill on your cardiac medications before your next appointment, please call your pharmacy.
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
## 2016-03-03 ENCOUNTER — Ambulatory Visit: Payer: Medicare Other | Admitting: Family Medicine

## 2016-03-08 ENCOUNTER — Other Ambulatory Visit: Payer: Self-pay | Admitting: Cardiology

## 2016-03-18 ENCOUNTER — Other Ambulatory Visit: Payer: Self-pay

## 2016-03-24 DIAGNOSIS — H2513 Age-related nuclear cataract, bilateral: Secondary | ICD-10-CM | POA: Diagnosis not present

## 2016-03-24 DIAGNOSIS — H02831 Dermatochalasis of right upper eyelid: Secondary | ICD-10-CM | POA: Diagnosis not present

## 2016-03-24 DIAGNOSIS — H02834 Dermatochalasis of left upper eyelid: Secondary | ICD-10-CM | POA: Diagnosis not present

## 2016-03-24 DIAGNOSIS — H43393 Other vitreous opacities, bilateral: Secondary | ICD-10-CM | POA: Diagnosis not present

## 2016-05-30 DIAGNOSIS — J029 Acute pharyngitis, unspecified: Secondary | ICD-10-CM | POA: Diagnosis not present

## 2016-08-15 ENCOUNTER — Encounter (HOSPITAL_COMMUNITY): Payer: Self-pay | Admitting: *Deleted

## 2016-08-15 ENCOUNTER — Emergency Department (HOSPITAL_COMMUNITY)
Admission: EM | Admit: 2016-08-15 | Discharge: 2016-08-15 | Disposition: A | Discharge status: Home / Self Care | Payer: Medicare Other | Attending: Emergency Medicine | Admitting: Emergency Medicine

## 2016-08-15 ENCOUNTER — Emergency Department (HOSPITAL_COMMUNITY): Payer: Medicare Other

## 2016-08-15 DIAGNOSIS — Z7982 Long term (current) use of aspirin: Secondary | ICD-10-CM | POA: Diagnosis not present

## 2016-08-15 DIAGNOSIS — Z87891 Personal history of nicotine dependence: Secondary | ICD-10-CM | POA: Insufficient documentation

## 2016-08-15 DIAGNOSIS — Z79899 Other long term (current) drug therapy: Secondary | ICD-10-CM | POA: Diagnosis not present

## 2016-08-15 DIAGNOSIS — I10 Essential (primary) hypertension: Secondary | ICD-10-CM | POA: Diagnosis not present

## 2016-08-15 DIAGNOSIS — I2510 Atherosclerotic heart disease of native coronary artery without angina pectoris: Secondary | ICD-10-CM | POA: Insufficient documentation

## 2016-08-15 DIAGNOSIS — R42 Dizziness and giddiness: Secondary | ICD-10-CM | POA: Insufficient documentation

## 2016-08-15 DIAGNOSIS — R079 Chest pain, unspecified: Secondary | ICD-10-CM | POA: Diagnosis not present

## 2016-08-15 LAB — BASIC METABOLIC PANEL
Anion gap: 11 (ref 5–15)
BUN: 13 mg/dL (ref 6–20)
CO2: 23 mmol/L (ref 22–32)
Calcium: 9.8 mg/dL (ref 8.9–10.3)
Chloride: 105 mmol/L (ref 101–111)
Creatinine, Ser: 0.74 mg/dL (ref 0.44–1.00)
GFR calc Af Amer: >60 mL/min (ref >60)
GFR calc non Af Amer: >60 mL/min (ref >60)
Glucose, Bld: 100 mg/dL — ABNORMAL HIGH (ref 65–99)
Potassium: 4 mmol/L (ref 3.5–5.1)
Sodium: 139 mmol/L (ref 135–145)

## 2016-08-15 LAB — CBC
HCT: 44.3 % (ref 36.0–46.0)
Hemoglobin: 14.9 g/dL (ref 12.0–15.0)
MCH: 26.4 pg (ref 26.0–34.0)
MCHC: 33.6 g/dL (ref 30.0–36.0)
MCV: 78.5 fL (ref 78.0–100.0)
Platelets: 225 10*3/uL (ref 150–400)
RBC: 5.64 MIL/uL — ABNORMAL HIGH (ref 3.87–5.11)
RDW: 14.7 % (ref 11.5–15.5)
WBC: 8.8 10*3/uL (ref 4.0–10.5)

## 2016-08-15 LAB — TROPONIN I
Troponin I: <0.03 ng/mL (ref <0.03)
Troponin I: <0.03 ng/mL (ref <0.03)

## 2016-08-15 MED ORDER — MECLIZINE HCL 25 MG PO TABS: 25.0000 mg | ORAL_TABLET | Freq: Once | ORAL | Status: DC

## 2016-08-15 MED ORDER — ONDANSETRON 4 MG PO TBDP: 4.0000 mg | ORAL_TABLET | Freq: Three times a day (TID) | ORAL | 0 refills | Status: DC | PRN

## 2016-08-15 MED ORDER — ONDANSETRON 4 MG PO TBDP
8.0000 mg | ORAL_TABLET | Freq: Once | ORAL | Status: AC
  Administered 2016-08-15: 8 mg via ORAL
  Filled 2016-08-15: qty 2

## 2016-08-15 MED ORDER — ACETAMINOPHEN 325 MG PO TABS
650.0000 mg | ORAL_TABLET | Freq: Once | ORAL | Status: AC
  Administered 2016-08-15: 650 mg via ORAL
  Filled 2016-08-15: qty 2

## 2016-08-15 MED ORDER — MECLIZINE HCL 25 MG PO TABS: 25.0000 mg | ORAL_TABLET | Freq: Three times a day (TID) | ORAL | 0 refills | Status: DC | PRN

## 2016-08-15 MED ORDER — MECLIZINE HCL 25 MG PO TABS
25.0000 mg | ORAL_TABLET | Freq: Once | ORAL | Status: AC
  Administered 2016-08-15: 25 mg via ORAL
  Filled 2016-08-15: qty 1

## 2016-08-15 NOTE — ED Triage Notes (Signed)
The pt has been dizzy since Wednesday with nausea and today she has had some lt chest pain.  She has had this previously but never had the nausea

## 2016-08-15 NOTE — ED Provider Notes (Signed)
[facility] DEPT Provider Note   CSN: [id number] Arrival date & time: 08/15/16  1546     History   Chief Complaint Chief Complaint  Patient presents with  . Dizziness    HPI Anne Martin is a 71 y.o. female. Chief complaint is dizziness  HPI: 71 year old female with a four-day history of dizziness. Worse today. Described as difficulty with balance when she walks. She feels like she has to hold onto things or she may lose her balance and fall. Is not lightheaded or near syncopal.  She had a similar episode about 4 years ago in [place]. She was seen at local ER. States she was evaluated for stroke. She had a head CT. She does not recall if she had an MRI. She was discharged and her symptoms resolved after 2-3 days. They have not recurred.  No known history of stroke or TA. She has a cardiac stent was discovered during evaluation for shortness of breath. She states that today she did have some sharp left-sided chest pain that went to her left shoulder. She's been doing a lot of gardening and working with her arms and hands. She has not had dyspnea on exertion or symptoms similar to her last angina equivalent.  Past Medical History:  Diagnosis Date  . Allergy    seasonal  . Anemia    prior to hysterectomy, fibroids  . Aortic insufficiency    a. 10/2015 Echo: mild to moderate AI.  Marland Kitchen Aortic stenosis    a. 10/2015 Echo: moderate AS.  Marland Kitchen Blood transfusion without reported diagnosis    with hysterectomy  . CAD (coronary artery disease)    a. 08/2009 s/p cath after abnormal nuclear study; LM nl, LAD 60%, D2 90% (PCI/DES), LCX 50 -60%, RCA nl, nl EF;  b. 2012 MV: no ischemia/infarct;  c. 08/2015 MV: no ischemia/infarct, EF 70%.  . Diastolic dysfunction    a. 10/2015 Echo: EF 55-60%, no rwma, Gr1 DD, mod AS, mild to mod AI.  Marland Kitchen Essential hypertension   . H/O: rheumatic fever   . History of colonoscopy   . Hyperlipidemia   . Osteopenia    last DEXA 11-22-13     Patient Active Problem  List   Diagnosis Date Noted  . Aortic insufficiency 02/29/2016  . Essential hypertension 02/29/2016  . Musculoskeletal chest pain 02/28/2016  . Aortic stenosis 02/24/2012  . Hyperlipidemia 11/07/2009  . CAD, NATIVE VESSEL 09/15/2009  . CAROTID BRUIT 09/15/2009  . CONTACT DERMATITIS 01/28/2009  . Disorder of bone and cartilage 12/17/2008  . ALLERGIC RHINITIS 08/16/2007  . KNEE PAIN 08/16/2007  . History of cardiovascular disorder 08/16/2007    Past Surgical History:  Procedure Laterality Date  . ANGIOPLASTY  may 2011   stents  . CHOLECYSTECTOMY  nov 2004  . COLONOSCOPY  12-22-10   per Dr. Henrene Pastor, adenomatous polyps, repeat in 5 yrs   . KNEE ARTHROSCOPY     Rt. Knee  . TONSILLECTOMY    . VAGINAL HYSTERECTOMY  1994    OB History    No data available       Home Medications    Prior to Admission medications   Medication Sig Start Date End Date Taking? Authorizing Provider  aspirin 81 MG tablet Take 81 mg by mouth daily.     Yes [provider]  atorvastatin (LIPITOR) 80 MG tablet Take 1 tablet (80 mg total) by mouth daily at 6 PM. 09/22/15  Yes Crenshaw, Denice Bors, MD  Calcium Carbonate (CALCIUM 600)  1500 MG TABS Take 600 mg of elemental calcium by mouth daily.    Yes [provider]  cholecalciferol (VITAMIN D) 1000 UNITS tablet Take 1,000 Units by mouth daily.   Yes [provider]  estradiol (VAGIFEM) 25 MCG vaginal tablet Place 25 mcg vaginally 2 (two) times a week.    Yes [provider]  fluticasone (FLONASE) 50 MCG/ACT nasal spray Place 2 sprays into both nostrils daily as needed for allergies. AS NEEDED   Yes [provider]  isosorbide mononitrate (IMDUR) 30 MG 24 hr tablet Take 1 tablet (30 mg total) by mouth daily. 03/09/16  Yes Lelon Perla, MD  Loratadine (CLARITIN) 10 MG CAPS Take 10 mg by mouth daily. 24 hr tabs   Yes [provider]  metoprolol succinate (TOPROL-XL) 25 MG 24 hr tablet TAKE 1 TABLET BY  MOUTH EVERY DAY Patient taking differently: TAKE 25 MG BY MOUTH EVERY DAY 12/09/15  Yes Crenshaw, Denice Bors, MD  nitroGLYCERIN (NITROSTAT) 0.4 MG SL tablet Place 1 tablet (0.4 mg total) under the tongue every 5 (five) minutes as needed. 12/20/13  Yes Burtis Junes, NP  meclizine (ANTIVERT) 25 MG tablet Take 1 tablet (25 mg total) by mouth 3 (three) times daily as needed for dizziness. 08/15/16   Tanna Furry, MD    Family History Family History  Problem Relation Age of Onset  . Heart disease Mother   . Heart disease Father     CABG  . Heart disease Brother     2 stents  . Ulcerative colitis Daughter   . Colon cancer Neg Hx     Social History Social History  Substance Use Topics  . Smoking status: Former Smoker    Types: Cigarettes    Quit date: 04/12/1977  . Smokeless tobacco: Never Used     Comment: quit age 62  . Alcohol use 0.0 oz/week     Comment: rarely     Allergies   Procaine hcl   Review of Systems Review of Systems  Constitutional: Negative for appetite change, chills, diaphoresis, fatigue and fever.  HENT: Negative for mouth sores, sore throat and trouble swallowing.   Eyes: Negative for visual disturbance.  Respiratory: Negative for cough, chest tightness, shortness of breath and wheezing.   Cardiovascular: Positive for chest pain.  Gastrointestinal: Negative for abdominal distention, abdominal pain, diarrhea, nausea and vomiting.  Endocrine: Negative for polydipsia, polyphagia and polyuria.  Genitourinary: Negative for dysuria, frequency and hematuria.  Musculoskeletal: Negative for gait problem.  Skin: Negative for color change, pallor and rash.  Neurological: Positive for dizziness. Negative for syncope, light-headedness and headaches.  Hematological: Does not bruise/bleed easily.  Psychiatric/Behavioral: Negative for behavioral problems and confusion.     Physical Exam Updated Vital Signs BP (!) 113/58   Pulse (!) 59   Temp 98.5 F (36.9 C)   Resp  16   Ht 5\' 5"  (1.651 m)   Wt 175 lb (79.4 kg)   SpO2 95%   BMI 29.12 kg/m   Physical Exam  Constitutional: She is oriented to person, place, and time. She appears well-developed and well-nourished. No distress.  HENT:  Head: Normocephalic.  Eyes: Conjunctivae are normal. Pupils are equal, round, and reactive to light. No scleral icterus.  Neck: Normal range of motion. Neck supple. No thyromegaly present.  Cardiovascular: Normal rate and regular rhythm.  Exam reveals no gallop and no friction rub.   No murmur heard. Pulmonary/Chest: Effort normal and breath sounds normal. No respiratory distress.  She has no wheezes. She has no rales.  Abdominal: Soft. Bowel sounds are normal. She exhibits no distension. There is no tenderness. There is no rebound.  Musculoskeletal: Normal range of motion.  Neurological: She is alert and oriented to person, place, and time.  She does not have marked nystagmus but has symptoms with sitting upright. She feels like the room is "spinning around". No pronator drift. No leg drift. No cranial nerve deficits.  Skin: Skin is warm and dry. No rash noted.  Psychiatric: She has a normal mood and affect. Her behavior is normal.     ED Treatments / Results  Labs (all labs ordered are listed, but only abnormal results are displayed) Labs Reviewed  BASIC METABOLIC PANEL - Abnormal; Notable for the following:       Result Value   Glucose, Bld 100 (*)    All other components within normal limits  CBC - Abnormal; Notable for the following:    RBC 5.64 (*)    All other components within normal limits  TROPONIN I  TROPONIN I    EKG  EKG Interpretation  Date/Time:  Sunday Aug 15 2016 15:55:30 EDT Ventricular Rate:  73 PR Interval:  142 QRS Duration: 84 QT Interval:  372 QTC Calculation: 409 R Axis:   77 Text Interpretation:  Normal sinus rhythm Right atrial enlargement Borderline ECG Confirmed by Jeneen Rinks  MD, [place] ([id number]) on 08/15/2016 5:13:41 PM        Radiology Dg Chest 2 View  Result Date: 08/15/2016 CLINICAL DATA:  Dizziness and chest pain today. Nausea. No shortness of breath. EXAM: CHEST  2 VIEW COMPARISON:  02/28/2016. FINDINGS: The heart size and mediastinal contours are stable. There is chronic central airway thickening without superimposed edema, airspace disease, pleural effusion or pneumothorax. There are stable degenerative changes throughout the thoracic spine. IMPRESSION: Stable chest.  No acute cardiopulmonary process. Electronically Signed   By: Richardean Sale M.D.   On: 08/15/2016 16:34    Procedures Procedures (including critical care time)  Medications Ordered in ED Medications  ondansetron (ZOFRAN-ODT) disintegrating tablet 8 mg (8 mg Oral Given 08/15/16 1604)  meclizine (ANTIVERT) tablet 25 mg (25 mg Oral Given 08/15/16 1720)     Initial Impression / Assessment and Plan / ED Course  I have reviewed the triage vital signs and the nursing notes.  Pertinent labs & imaging results that were available during my care of the patient were reviewed by me and considered in my medical decision making (see chart for details).     Likely acute peripheral vertigo. No indication for imaging at this time. EKG shows no acute or ischemic changes. She has no chest pain now she had 2 hours and earlier in the day and resolved about an hour and a half ago. First troponin is normal with her planning a second. She was given by mouth meclizine here 1 hour later is a symptomatically sitting upright. Awaiting second troponin. If negative would be appropriate for discharge and treatment for acute peripheral vertigo.  Final Clinical Impressions(s) / ED Diagnoses   Final diagnoses:  Vertigo    New Prescriptions New Prescriptions   MECLIZINE (ANTIVERT) 25 MG TABLET    Take 1 tablet (25 mg total) by mouth 3 (three) times daily as needed for dizziness.     Tanna Furry, MD 08/15/16 [phone]

## 2016-08-15 NOTE — ED Notes (Signed)
Patient states that she was dizzy and nauseated upon arrival and now she has no more nausea or dizziness.  Patient is resting in room watching TV.  Waiting on repeat troponin at 1930

## 2016-08-15 NOTE — Discharge Instructions (Addendum)
Take meclizine until 24 hours without vertigo No driving until 24 hours without vertigo. Call Dr. Erik Obey (ENT) for follow up appointment.

## 2016-08-15 NOTE — ED Notes (Signed)
Patient Alert and oriented X4. Stable and ambulatory. Patient verbalized understanding of the discharge instructions.  Patient belongings were taken by the patient.  

## 2016-08-16 ENCOUNTER — Telehealth: Payer: Self-pay | Admitting: Cardiology

## 2016-08-16 NOTE — Telephone Encounter (Signed)
New Message     Pt is stating she was seen in ER for Vertigo because she was having dizziness, all her test came back normal , but now she is having chest pressure

## 2016-08-16 NOTE — Telephone Encounter (Signed)
Returned call to patient.She stated she went to [facility] ED yesterday for dizziness was told she has vertigo.Stated she has been having chest pain off and on over the weekend.No chest pain at present.Stated she started back last week lifting weights and doing yard work.She is concerned about chest pain.Advised she has appointment tomorrow with Ignacia Bayley NP at 1:30 pm.Advised to keep appointment.Advised to go back to ED if needed for chest pain.

## 2016-08-17 ENCOUNTER — Ambulatory Visit: Payer: Medicare Other | Admitting: Nurse Practitioner

## 2016-08-18 DIAGNOSIS — H8143 Vertigo of central origin, bilateral: Secondary | ICD-10-CM | POA: Diagnosis not present

## 2016-08-18 DIAGNOSIS — J342 Deviated nasal septum: Secondary | ICD-10-CM | POA: Diagnosis not present

## 2016-08-18 DIAGNOSIS — H6121 Impacted cerumen, right ear: Secondary | ICD-10-CM | POA: Diagnosis not present

## 2016-08-18 DIAGNOSIS — J322 Chronic ethmoidal sinusitis: Secondary | ICD-10-CM | POA: Diagnosis not present

## 2016-08-18 DIAGNOSIS — H903 Sensorineural hearing loss, bilateral: Secondary | ICD-10-CM | POA: Diagnosis not present

## 2016-08-18 DIAGNOSIS — J321 Chronic frontal sinusitis: Secondary | ICD-10-CM | POA: Diagnosis not present

## 2016-08-18 DIAGNOSIS — R42 Dizziness and giddiness: Secondary | ICD-10-CM | POA: Diagnosis not present

## 2016-08-20 ENCOUNTER — Ambulatory Visit (INDEPENDENT_AMBULATORY_CARE_PROVIDER_SITE_OTHER): Payer: Medicare Other | Admitting: Family Medicine

## 2016-08-20 ENCOUNTER — Encounter: Payer: Self-pay | Admitting: Family Medicine

## 2016-08-20 VITALS — BP 114/66 | HR 62 | Temp 98.4°F | Ht 65.0 in | Wt 179.0 lb

## 2016-08-20 DIAGNOSIS — J019 Acute sinusitis, unspecified: Secondary | ICD-10-CM

## 2016-08-20 DIAGNOSIS — R42 Dizziness and giddiness: Secondary | ICD-10-CM | POA: Diagnosis not present

## 2016-08-20 MED ORDER — METHYLPREDNISOLONE 4 MG PO TBPK: ORAL_TABLET | ORAL | 0 refills | Status: DC

## 2016-08-20 NOTE — Progress Notes (Signed)
   Subjective:    Patient ID: Anne Martin, female    DOB: 11/05/1945, 71 y.o.   MRN: [id number]  HPI Here to follow up on vertigo. She has had intermittent dizziness this week which is worse when she moves her head quickly. Also some headaches and sinus pressure. No fever or cough. She went to the ER on 08-15-16 and was diagnosed with vertigo. She was told to take Claritin daily and to use Meclizine as needed. She then saw Dr. Marcelline Mates 3 days ago and he gave her 3 days of Azithromycin. todayt she feels better with no dizziness, but she still feels sinus pressure.    Review of Systems  Constitutional: Negative.   HENT: Positive for congestion, sinus pain and sinus pressure. Negative for ear pain, postnasal drip and sore throat.   Eyes: Negative.   Respiratory: Negative.   Neurological: Negative.        Objective:   Physical Exam  Constitutional: She is oriented to person, place, and time. She appears well-developed and well-nourished.  HENT:  Right Ear: External ear normal.  Left Ear: External ear normal.  Nose: Nose normal.  Mouth/Throat: Oropharynx is clear and moist.  Eyes: Conjunctivae are normal. Pupils are equal, round, and reactive to light.  Neck: Neck supple. No thyromegaly present.  Cardiovascular: Normal rate, regular rhythm, normal heart sounds and intact distal pulses.   Pulmonary/Chest: Effort normal and breath sounds normal.  Lymphadenopathy:    She has no cervical adenopathy.  Neurological: She is alert and oriented to person, place, and time. No cranial nerve deficit. Coordination normal.          Assessment & Plan:  She had vertigo which has now resolved, and this seems to have stemmed from a sinusitis. We will give her a Medrol dose pack. Recheck prn.  Alysia Penna, MD

## 2016-08-20 NOTE — Patient Instructions (Signed)
WE NOW OFFER   [facility] FAST TRACK!!!  SAME DAY Appointments for ACUTE CARE  Such as: Sprains, Injuries, cuts, abrasions, rashes, muscle pain, joint pain, back pain Colds, flu, sore throats, headache, allergies, cough, fever  Ear pain, sinus and eye infections Abdominal pain, nausea, vomiting, diarrhea, upset stomach Animal/insect bites  3 Easy Ways to Schedule: Walk-In Scheduling Call in scheduling Mychart Sign-up: [email or web address]         

## 2016-08-30 ENCOUNTER — Ambulatory Visit (INDEPENDENT_AMBULATORY_CARE_PROVIDER_SITE_OTHER): Payer: Medicare Other | Admitting: Family Medicine

## 2016-08-30 ENCOUNTER — Encounter: Payer: Self-pay | Admitting: Family Medicine

## 2016-08-30 VITALS — BP 122/70 | Temp 97.8°F

## 2016-08-30 DIAGNOSIS — L02219 Cutaneous abscess of trunk, unspecified: Secondary | ICD-10-CM

## 2016-08-30 DIAGNOSIS — L03319 Cellulitis of trunk, unspecified: Secondary | ICD-10-CM

## 2016-08-30 DIAGNOSIS — W57XXXA Bitten or stung by nonvenomous insect and other nonvenomous arthropods, initial encounter: Secondary | ICD-10-CM | POA: Diagnosis not present

## 2016-08-30 MED ORDER — DOXYCYCLINE HYCLATE 100 MG PO CAPS: 100.0000 mg | ORAL_CAPSULE | Freq: Two times a day (BID) | ORAL | 0 refills | Status: AC

## 2016-08-30 NOTE — Progress Notes (Signed)
   Subjective:    Patient ID: Anne Martin, female    DOB: [birth year], 71 y.o.   MRN: [id number]  HPI Here to check a tick bite. She noticed the tick on the lower back 2 days ago and her husband pulled it out with tweezers. An area of redness came up quickly around the site and it persists today. She feels fine in general, no fever or headache or joint pains.    Review of Systems  Constitutional: Negative.   Respiratory: Negative.   Cardiovascular: Negative.   Musculoskeletal: Negative.   Skin: Positive for color change.  Neurological: Negative.        Objective:   Physical Exam  Constitutional: She is oriented to person, place, and time. She appears well-developed and well-nourished.  Cardiovascular: Normal rate, regular rhythm, normal heart sounds and intact distal pulses.   Pulmonary/Chest: Effort normal and breath sounds normal.  Neurological: She is alert and oriented to person, place, and time.  Skin:  Small bite mark over the sacrum with a 3 cm zone of erythema around it           Assessment & Plan:  Tick bite, cover with Doxycycline. Recheck prn. Alysia Penna, MD

## 2016-09-16 ENCOUNTER — Other Ambulatory Visit: Payer: Self-pay | Admitting: Cardiology

## 2016-10-13 ENCOUNTER — Other Ambulatory Visit: Payer: Self-pay | Admitting: Cardiology

## 2016-10-19 DIAGNOSIS — R3915 Urgency of urination: Secondary | ICD-10-CM | POA: Diagnosis not present

## 2016-10-26 ENCOUNTER — Telehealth: Payer: Self-pay | Admitting: Cardiology

## 2016-10-26 NOTE — Telephone Encounter (Signed)
LMTCB on mobile

## 2016-10-26 NOTE — Telephone Encounter (Signed)
Pt c/o of Chest Pain: STAT if CP now or developed within 24 hours  1. Are you having CP right now? No   2. Are you experiencing any other symptoms (ex. SOB, nausea, vomiting, sweating)? No  3. How long have you been experiencing CP? A month   4. Is your CP continuous or coming and going? Coming and going   5. Have you taken Nitroglycerin? No      ?  

## 2016-10-26 NOTE — Telephone Encounter (Signed)
F/u patient would like to give mobile # instead [phone]

## 2016-10-27 ENCOUNTER — Encounter (HOSPITAL_COMMUNITY): Payer: Self-pay | Admitting: Emergency Medicine

## 2016-10-27 ENCOUNTER — Emergency Department (HOSPITAL_COMMUNITY): Payer: Medicare Other

## 2016-10-27 DIAGNOSIS — E785 Hyperlipidemia, unspecified: Secondary | ICD-10-CM | POA: Insufficient documentation

## 2016-10-27 DIAGNOSIS — Z955 Presence of coronary angioplasty implant and graft: Secondary | ICD-10-CM | POA: Insufficient documentation

## 2016-10-27 DIAGNOSIS — I351 Nonrheumatic aortic (valve) insufficiency: Secondary | ICD-10-CM | POA: Insufficient documentation

## 2016-10-27 DIAGNOSIS — R0789 Other chest pain: Secondary | ICD-10-CM | POA: Diagnosis not present

## 2016-10-27 DIAGNOSIS — I352 Nonrheumatic aortic (valve) stenosis with insufficiency: Secondary | ICD-10-CM | POA: Insufficient documentation

## 2016-10-27 DIAGNOSIS — I10 Essential (primary) hypertension: Secondary | ICD-10-CM | POA: Insufficient documentation

## 2016-10-27 DIAGNOSIS — I2510 Atherosclerotic heart disease of native coronary artery without angina pectoris: Principal | ICD-10-CM | POA: Insufficient documentation

## 2016-10-27 DIAGNOSIS — Z7982 Long term (current) use of aspirin: Secondary | ICD-10-CM | POA: Diagnosis not present

## 2016-10-27 DIAGNOSIS — Z79899 Other long term (current) drug therapy: Secondary | ICD-10-CM | POA: Insufficient documentation

## 2016-10-27 DIAGNOSIS — R079 Chest pain, unspecified: Secondary | ICD-10-CM | POA: Diagnosis not present

## 2016-10-27 LAB — I-STAT TROPONIN, ED: Troponin i, poc: 0.01 ng/mL (ref 0.00–0.08)

## 2016-10-27 LAB — CBC
HCT: 42.6 % (ref 36.0–46.0)
Hemoglobin: 13.9 g/dL (ref 12.0–15.0)
MCH: 26 pg (ref 26.0–34.0)
MCHC: 32.6 g/dL (ref 30.0–36.0)
MCV: 79.6 fL (ref 78.0–100.0)
Platelets: 198 10*3/uL (ref 150–400)
RBC: 5.35 MIL/uL — ABNORMAL HIGH (ref 3.87–5.11)
RDW: 14.8 % (ref 11.5–15.5)
WBC: 8.1 10*3/uL (ref 4.0–10.5)

## 2016-10-27 MED ORDER — NITROGLYCERIN 0.4 MG SL SUBL: 0.4000 mg | SUBLINGUAL_TABLET | SUBLINGUAL | 3 refills | Status: DC | PRN

## 2016-10-27 NOTE — Telephone Encounter (Signed)
New Message ° ° pt verbalized that she is returning call for rn °

## 2016-10-27 NOTE — Telephone Encounter (Signed)
Agree with plan; paov if needs to be seen sooner Kirk Ruths

## 2016-10-27 NOTE — ED Triage Notes (Signed)
Pt presents to ED for assessment of right sided chest pain, intermittent since November of last year.  Pt states she spoke to her PCP tonight when she experienced chest pain and she was instructed to do 3 nitros, 5 minutes apart,  Hx of stent placement for nitro prescription.  Pt felt relief upon arriving to the ED.  Denies any other associated symptoms.

## 2016-10-27 NOTE — Telephone Encounter (Signed)
Returned call to patient and informed her that MD agreed w/plan. She asked about NTG use and when she should take this. Advised to take if she has chest pain that she is concerned about being heart related - advised that I cannot personally speak for when she should take this as I am not experiencing her symptoms. Provided additional education on medication use to make patient aware that NTG use can lower BP and cause headache.

## 2016-10-27 NOTE — Telephone Encounter (Signed)
Returned call to patient She has been having chest pain on and off for about 1 month She states last fall, she put out a lot of bags of mulch and developed chest pain. She went to ED for this pain last fall and was told it was MSK She was fine this AM and then went for walk she was sore, like a rubber band around her chest She reports shortness of breath with exertion - not new Denies lightheadedness/dizziness Patient had a stent put in 6 years ago She has not used PRN NTG Educated on use of NTG - Rx(s) sent to pharmacy electronically. Informed patient to seek ED eval if she has CP lasting >30 mins and/or has to use NTGx3 Advised would send to Dr. Stanford Breed for advice Patient has OV 8/3 with MD

## 2016-10-28 ENCOUNTER — Observation Stay (HOSPITAL_COMMUNITY)
Admission: EM | Admit: 2016-10-28 | Discharge: 2016-10-28 | Disposition: A | Discharge status: Home / Self Care | Payer: Medicare Other | Attending: Internal Medicine | Admitting: Internal Medicine

## 2016-10-28 ENCOUNTER — Observation Stay (HOSPITAL_BASED_OUTPATIENT_CLINIC_OR_DEPARTMENT_OTHER): Payer: Medicare Other

## 2016-10-28 ENCOUNTER — Encounter (HOSPITAL_COMMUNITY): Payer: Self-pay | Admitting: General Practice

## 2016-10-28 ENCOUNTER — Other Ambulatory Visit (HOSPITAL_COMMUNITY): Payer: Medicare Other

## 2016-10-28 DIAGNOSIS — E785 Hyperlipidemia, unspecified: Secondary | ICD-10-CM | POA: Diagnosis present

## 2016-10-28 DIAGNOSIS — I2510 Atherosclerotic heart disease of native coronary artery without angina pectoris: Secondary | ICD-10-CM | POA: Diagnosis not present

## 2016-10-28 DIAGNOSIS — R079 Chest pain, unspecified: Secondary | ICD-10-CM | POA: Diagnosis not present

## 2016-10-28 DIAGNOSIS — R072 Precordial pain: Secondary | ICD-10-CM

## 2016-10-28 LAB — NM MYOCAR MULTI W/SPECT W/WALL MOTION / EF
Estimated workload: 1 METS
Exercise duration (min): 0 min
Exercise duration (sec): 0 s
MPHR: 149 {beats}/min
Peak HR: 127 {beats}/min
Percent HR: 85 %
RPE: 0
Rest HR: 75 {beats}/min

## 2016-10-28 LAB — BASIC METABOLIC PANEL
Anion gap: 7 (ref 5–15)
BUN: 20 mg/dL (ref 6–20)
CO2: 26 mmol/L (ref 22–32)
Calcium: 9.4 mg/dL (ref 8.9–10.3)
Chloride: 104 mmol/L (ref 101–111)
Creatinine, Ser: 0.87 mg/dL (ref 0.44–1.00)
GFR calc Af Amer: >60 mL/min (ref >60)
GFR calc non Af Amer: >60 mL/min (ref >60)
Glucose, Bld: 96 mg/dL (ref 65–99)
Potassium: 3.6 mmol/L (ref 3.5–5.1)
Sodium: 137 mmol/L (ref 135–145)

## 2016-10-28 LAB — TROPONIN I
Troponin I: <0.03 ng/mL (ref <0.03)
Troponin I: <0.03 ng/mL (ref <0.03)
Troponin I: <0.03 ng/mL (ref <0.03)

## 2016-10-28 LAB — LIPID PANEL
Cholesterol: 107 mg/dL (ref 0–200)
HDL: 55 mg/dL (ref >40)
LDL Cholesterol: 39 mg/dL (ref 0–99)
Total CHOL/HDL Ratio: 1.9 RATIO
Triglycerides: 66 mg/dL (ref <150)
VLDL: 13 mg/dL (ref 0–40)

## 2016-10-28 LAB — D-DIMER, QUANTITATIVE: D-Dimer, Quant: <0.27 ug/mL-FEU (ref 0.00–0.50)

## 2016-10-28 MED ORDER — ASPIRIN 81 MG PO CHEW
324.0000 mg | CHEWABLE_TABLET | Freq: Once | ORAL | Status: AC
  Administered 2016-10-28: 324 mg via ORAL
  Filled 2016-10-28: qty 4

## 2016-10-28 MED ORDER — ATORVASTATIN CALCIUM 80 MG PO TABS
80.0000 mg | ORAL_TABLET | Freq: Every day | ORAL | Status: DC
  Administered 2016-10-28: 80 mg via ORAL
  Filled 2016-10-28: qty 1

## 2016-10-28 MED ORDER — AMINOPHYLLINE 25 MG/ML IV SOLN
INTRAVENOUS | Status: AC
  Filled 2016-10-28: qty 10

## 2016-10-28 MED ORDER — FLUTICASONE PROPIONATE 50 MCG/ACT NA SUSP: 2.0000 | Freq: Every day | NASAL | Status: DC | PRN

## 2016-10-28 MED ORDER — GI COCKTAIL ~~LOC~~: 30.0000 mL | Freq: Four times a day (QID) | ORAL | Status: DC | PRN

## 2016-10-28 MED ORDER — ACETAMINOPHEN 325 MG PO TABS: 650.0000 mg | ORAL_TABLET | ORAL | Status: DC | PRN

## 2016-10-28 MED ORDER — NITROGLYCERIN 0.4 MG SL SUBL
0.4000 mg | SUBLINGUAL_TABLET | SUBLINGUAL | Status: DC | PRN
Start: 2016-10-28 — End: 2016-10-28
  Filled 2016-10-28: qty 1

## 2016-10-28 MED ORDER — REGADENOSON 0.4 MG/5ML IV SOLN
INTRAVENOUS | Status: AC
  Filled 2016-10-28: qty 5

## 2016-10-28 MED ORDER — LORATADINE 10 MG PO TABS: 10.0000 mg | ORAL_TABLET | Freq: Every day | ORAL | Status: DC

## 2016-10-28 MED ORDER — ASPIRIN 81 MG PO CHEW: 81.0000 mg | CHEWABLE_TABLET | Freq: Every day | ORAL | Status: DC

## 2016-10-28 MED ORDER — ONDANSETRON HCL 4 MG/2ML IJ SOLN: 4.0000 mg | Freq: Four times a day (QID) | INTRAMUSCULAR | Status: DC | PRN

## 2016-10-28 MED ORDER — MORPHINE SULFATE (PF) 4 MG/ML IV SOLN: 2.0000 mg | INTRAVENOUS | Status: DC | PRN

## 2016-10-28 MED ORDER — REGADENOSON 0.4 MG/5ML IV SOLN
0.4000 mg | Freq: Once | INTRAVENOUS | Status: AC
  Administered 2016-10-28: 0.4 mg via INTRAVENOUS
  Filled 2016-10-28: qty 5

## 2016-10-28 MED ORDER — ENOXAPARIN SODIUM 30 MG/0.3ML ~~LOC~~ SOLN
30.0000 mg | SUBCUTANEOUS | Status: DC
  Administered 2016-10-28: 30 mg via SUBCUTANEOUS
  Filled 2016-10-28: qty 0.3

## 2016-10-28 MED ORDER — METOPROLOL SUCCINATE ER 25 MG PO TB24: 25.0000 mg | ORAL_TABLET | Freq: Every day | ORAL | Status: DC

## 2016-10-28 MED ORDER — SODIUM CHLORIDE 0.9 % IV SOLN
INTRAVENOUS | Status: DC
  Administered 2016-10-28: 06:00:00 via INTRAVENOUS

## 2016-10-28 MED ORDER — TECHNETIUM TC 99M TETROFOSMIN IV KIT
10.0000 | PACK | Freq: Once | INTRAVENOUS | Status: AC | PRN
  Administered 2016-10-28: 10 via INTRAVENOUS

## 2016-10-28 MED ORDER — TECHNETIUM TC 99M TETROFOSMIN IV KIT
30.0000 | PACK | Freq: Once | INTRAVENOUS | Status: AC | PRN
  Administered 2016-10-28: 30 via INTRAVENOUS

## 2016-10-28 NOTE — Progress Notes (Signed)
Patient denies any pain, independent to bathroom.

## 2016-10-28 NOTE — Consult Note (Signed)
Cardiology Consultation:   Patient ID: Anne Martin; [id number]; [birth year]   Admit date: 10/28/2016 Date of Consult: 10/28/2016  Primary Care Provider: Laurey Morale, MD Primary Cardiologist: Dr. Stanford Breed Primary Electrophysiologist:  none   Patient Profile:   Anne Martin is a 71 y.o. female with a complex past medical history that includes CAD s/p stenting of the second diagonal in 2011 with negative myoview in 2012 and again in May 2017. She also has hypertension, chronic diastolic HF,  hyperlipidemia, remote history of rheumatic fever and moderate aortic stenosis with mild to moderate aortic insufficiency.   The patient is being seen today for the evaluation of chest pain at the request of Dr. Eliseo Squires.  History of Present Illness:   Anne Martin was last seen in our office by Stefan Church on November 2017 for a hospital follow up visit. She ruled out for an MI and her chest pain was felt to be related to musculoskeletal pain.  She reports having on again off again pain over the past month. It is typically brought on by exertion and feels like a tight band wrapping around her chest. The pain usually lasts 30 minutes, is associated with SOB, and then eases off with rest. She decided to come to the ER last night after she walked up a steep hill with her friend and had another episode, she called the office and took 3 nitro SL as recommended and came into the ER. She has been resting in bed since arrival to the ER and  has not had any further episodes of chest pain. Her EKG showed normal sinus rhythm without any ischemic changes. Troponins are negative x 3, BMP shows no abnormalities. LDL 39, neg d-dimer. Hemoglobin is 13.9.   Vital signs: Blood pressure 123/65, pulse 63, temperature 98.2 F (36.8 C), temperature source Oral, resp. rate 16, height 5\' 5"  (1.651 m), weight 181 lb 9.6 oz (82.4 kg), SpO2 97 %.  Her home medication list includes: Aspirin 81mg , Lipitor 80mg , Imdur 30mg , Toprol  XL 25mg , Nitro tabs PRN   Past Medical History:  Diagnosis Date  . Allergy    seasonal  . Anemia    prior to hysterectomy, fibroids  . Aortic insufficiency    a. 10/2015 Echo: mild to moderate AI.  Marland Kitchen Aortic stenosis    a. 10/2015 Echo: moderate AS.  Marland Kitchen Blood transfusion without reported diagnosis    with hysterectomy  . CAD (coronary artery disease)    a. 08/2009 s/p cath after abnormal nuclear study; LM nl, LAD 60%, D2 90% (PCI/DES), LCX 50 -60%, RCA nl, nl EF;  b. 2012 MV: no ischemia/infarct;  c. 08/2015 MV: no ischemia/infarct, EF 70%.  . Diastolic dysfunction    a. 10/2015 Echo: EF 55-60%, no rwma, Gr1 DD, mod AS, mild to mod AI.  Marland Kitchen Essential hypertension   . H/O: rheumatic fever   . History of colonoscopy   . Hyperlipidemia   . Osteopenia    last DEXA 11-22-13     Past Surgical History:  Procedure Laterality Date  . ANGIOPLASTY  may 2011   stents  . CHOLECYSTECTOMY  nov 2004  . COLONOSCOPY  12-22-10   per Dr. Henrene Pastor, adenomatous polyps, repeat in 5 yrs   . KNEE ARTHROSCOPY     Rt. Knee  . TONSILLECTOMY    . VAGINAL HYSTERECTOMY  1994     Inpatient Medications: Scheduled Meds: . aspirin  81 mg Oral Daily  . atorvastatin  80 mg  Oral q1800  . enoxaparin (LOVENOX) injection  30 mg Subcutaneous Q24H  . loratadine  10 mg Oral Daily  . metoprolol succinate  25 mg Oral Daily   Continuous Infusions: . sodium chloride 75 mL/hr at 10/28/16 0549   PRN Meds: acetaminophen, fluticasone, gi cocktail, morphine injection, nitroGLYCERIN, ondansetron (ZOFRAN) IV  Allergies:    Allergies  Allergen Reactions  . Procaine Hcl Other (See Comments)    Unknown    Social History:   Social History   Social History  . Marital status: Married    Spouse name: N/A  . Number of children: 2  . Years of education: N/A   Occupational History  . retired Pharmacist, hospital   .  Retired   Social History Main Topics  . Smoking status: Former Smoker    Types: Cigarettes    Quit date: 04/12/1977   . Smokeless tobacco: Never Used     Comment: quit age 83  . Alcohol use 0.0 oz/week     Comment: rarely  . Drug use: No  . Sexual activity: Not Currently   Other Topics Concern  . Not on file   Social History Narrative  . No narrative on file      Family History: The patient's family history includes Heart disease in her brother, father, and mother; Ulcerative colitis in her daughter. There is no history of Colon cancer.  ROS:  Please see the history of present illness.  All other ROS reviewed and negative.     Physical Exam/Data:   Vitals:   10/28/16 0300 10/28/16 0400 10/28/16 0415 10/28/16 0622  BP:  140/67 (!) 142/62 123/65  Pulse:  63 (!) 59 63  Resp:  16 15 16   Temp:    98.2 F (36.8 C)  TempSrc:    Oral  SpO2:  97% 96% 97%  Weight: 179 lb (81.2 kg)   181 lb 9.6 oz (82.4 kg)  Height: 5\' 5"  (1.651 m)   5\' 5"  (1.651 m)    Intake/Output Summary (Last 24 hours) at 10/28/16 0859 Last data filed at 10/28/16 0600  Gross per 24 hour  Intake            13.75 ml  Output                0 ml  Net            13.75 ml   Filed Weights   10/28/16 0300 10/28/16 0622  Weight: 179 lb (81.2 kg) 181 lb 9.6 oz (82.4 kg)   Body mass index is 30.22 kg/m.  General: Well developed, well nourished, in no acute distress.  Head: Normocephalic, atraumatic, Neck: Negative for carotid bruits. JVD not elevated. Lungs: Clear bilaterally to auscultation without wheezes, rales, or rhonchi. Breathing is unlabored. Heart: RRR with S1 S2. No murmurs, rubs, or gallops appreciated. Abdomen: Soft, non-tender, non-distended with normoactive bowel sounds. Msk:  Strength and tone appear normal for age. Extremities: No clubbing or cyanosis. No edema.  Neuro: Alert and oriented X 3. No facial asymmetry. Psych:  Responds to questions appropriately with a normal affect.  EKG:  The EKG was personally reviewed and demonstrates NSR, no ischemic changes.  Relevant CV Studies:  Echocardiogram  pending this admission  Echocardiogram 11/10/2015 Study Conclusions  - Left ventricle: The cavity size was normal. Systolic function was   normal. The estimated ejection fraction was in the range of 55%   to 60%. Wall motion was normal; there were no regional wall  motion abnormalities. Doppler parameters are consistent with   abnormal left ventricular relaxation (grade 1 diastolic   dysfunction). - Aortic valve: There was moderate stenosis. There was mild to   moderate regurgitation.  08/16/2015 Nuclear Stress Test  1. No evidence of ischemia 2. EF 70%   Laboratory Data:  Chemistry Recent Labs Lab 10/27/16 2318  NA 137  K 3.6  CL 104  CO2 26  GLUCOSE 96  BUN 20  CREATININE 0.87  CALCIUM 9.4  GFRNONAA >60  GFRAA >60  ANIONGAP 7    No results for input(s): PROT, ALBUMIN, AST, ALT, ALKPHOS, BILITOT in the last 168 hours. Hematology Recent Labs Lab 10/27/16 2318  WBC 8.1  RBC 5.35*  HGB 13.9  HCT 42.6  MCV 79.6  MCH 26.0  MCHC 32.6  RDW 14.8  PLT 198   Cardiac Enzymes Recent Labs Lab 10/28/16 0303 10/28/16 0602  TROPONINI <0.03 <0.03    Recent Labs Lab 10/27/16 2348  TROPIPOC 0.01    BNPNo results for input(s): BNP, PROBNP in the last 168 hours.  DDimer  Recent Labs Lab 10/28/16 0602  DDIMER <0.27    Radiology/Studies:  Dg Chest 2 View  Result Date: 10/28/2016 CLINICAL DATA:  Chest pain EXAM: CHEST  2 VIEW COMPARISON:  Chest radiograph 08/15/2016 FINDINGS: The heart size and mediastinal contours are within normal limits. Both lungs are clear. The visualized skeletal structures are unremarkable. IMPRESSION: No active cardiopulmonary disease. Electronically Signed   By: Ulyses Jarred M.D.   On: 10/28/2016 00:09    Assessment and Plan:   Chest Pain, hx of stent to diagonal in 2011: Ms Fagerstrom's symptoms have been worsening over the past month with exertional chest pain with associated SOB. Her episodes lasts approx 30 minutes then resolves with  rest. She has not had associated N/V/Diarrhea or diaphoresis. She has not had cp with laying down. Her last nuclear stress test was done 1 year ago and was negative. Given the exertional component of her chest pain I think that she would benefit from a repeat nuclear stress test. Fortunately her EKG is not showing any ischemic changes, her troponins have been negative, and she does not have ongoing chest pains. She reports feeling tired and does not having tennis shoes to walk on treadmill. Recommend Lexiscan Myoview. -- continue home medications    Signed, Linus Mako, PA-C  10/28/2016 8:59 AM  History and all data above reviewed.  Patient examined. The patient has chest pain as above.  This is not like previous angina.  It has somewhat atypical features.  Worse with bending and physical activity like climbing up a hill.  No objective evidence of ischemia.   I agree with the findings as above.  The patient exam reveals COR:RRR  ,  Lungs: Clear  ,  Abd: Positive bowel sounds, no rebound no guarding, Ext No edema  .  All available labs, radiology testing, previous records reviewed. Agree with documented assessment and plan. Chest pain:  Somewhat atypical.  Agree with Lexiscan Myoview.  No further ischemia work up of this is negative.  She can get her follow up echo for AS as an outpatient.   Jeneen Rinks Hymie Gorr  11:09 AM  10/28/2016

## 2016-10-28 NOTE — H&P (Addendum)
History and Physical    Anne Martin NOM:[id number] DOB: March 16, 1946 DOA: 10/28/2016  Referring MD/NP/PA: Dr. Cyril Mourning Ward PCP: Laurey Morale, MD  Patient coming from: Home  Chief Complaint: Chest pain  HPI: Anne Martin is a 71 y.o. female with medical history significant of CAD s/p stent 7 years ago, diastolic dysfunction is EF 55-60% , H/O rheumatic heart disease, aortic stenosis, and aortic insufficiency; who presents with complaints of intermittent chest pain over the last 1 month. Patient reports chest pain symptoms can occur on the left and right side of her chest. Describes symptoms sometimes feeling like a "fist pressing" on her chest/ back, and sometimes feels "like a rubber band wrapped around her chest". Previously, symptoms had only occurred when the patient was exerting herself like walking up a hill. He reports associated symptoms of shortness of breath She had been taking Aleve and Advil as needed with some improvement of symptoms. She had previously been evaluated back in 02/2016 for similar chest pain symptoms and was told at that time was told that it was musculoskeletal in nature. However, yesterday she experienced substernal chest discomfort while laying down. She called her PCP who recommended her to take 3 nitroglycerin to see if symptoms are relieved. At its worst the chest pain was a 7-8 /10 and following the nitroglycerin was down to a 4/10. Patient does report that she's been on the few trips here recently including a 10 hour drive for a college reunion in the last week or so, in a beach trip in June. Denies having any significant diaphoresis, orthopnea, leg swelling, calf pain, fever, chills, cough, or dysuria.  ED Course: On admission to the emergency department patient was noted to have relatively normal vital signs. Lab work was unremarkable including initial troponin. EKG showed no significant ischemic changes. Patient was given full dose aspirin and currently reports  chest pain resolved.  Review of Systems: Review of Systems  Constitutional: Negative for chills, diaphoresis, fever and weight loss.  HENT: Negative for ear discharge and ear pain.   Eyes: Negative for pain and discharge.  Respiratory: Positive for shortness of breath. Negative for cough and sputum production.   Cardiovascular: Positive for chest pain. Negative for orthopnea and leg swelling.  Gastrointestinal: Negative for diarrhea.  Genitourinary: Negative for frequency.  Musculoskeletal: Negative for falls.  Skin: Negative for itching and rash.  Neurological: Negative for focal weakness, seizures and loss of consciousness.  Psychiatric/Behavioral: Negative for hallucinations and substance abuse. The patient does not have insomnia.   All other systems reviewed and are negative.   Past Medical History:  Diagnosis Date  . Allergy    seasonal  . Anemia    prior to hysterectomy, fibroids  . Aortic insufficiency    a. 10/2015 Echo: mild to moderate AI.  Marland Kitchen Aortic stenosis    a. 10/2015 Echo: moderate AS.  Marland Kitchen Blood transfusion without reported diagnosis    with hysterectomy  . CAD (coronary artery disease)    a. 08/2009 s/p cath after abnormal nuclear study; LM nl, LAD 60%, D2 90% (PCI/DES), LCX 50 -60%, RCA nl, nl EF;  b. 2012 MV: no ischemia/infarct;  c. 08/2015 MV: no ischemia/infarct, EF 70%.  . Diastolic dysfunction    a. 10/2015 Echo: EF 55-60%, no rwma, Gr1 DD, mod AS, mild to mod AI.  Marland Kitchen Essential hypertension   . H/O: rheumatic fever   . History of colonoscopy   . Hyperlipidemia   . Osteopenia    last  DEXA 11-22-13     Past Surgical History:  Procedure Laterality Date  . ANGIOPLASTY  may 2011   stents  . CHOLECYSTECTOMY  nov 2004  . COLONOSCOPY  12-22-10   per Dr. Henrene Pastor, adenomatous polyps, repeat in 5 yrs   . KNEE ARTHROSCOPY     Rt. Knee  . TONSILLECTOMY    . VAGINAL HYSTERECTOMY  1994     reports that she quit smoking about 39 years ago. Her smoking use included  Cigarettes. She has never used smokeless tobacco. She reports that she drinks alcohol. She reports that she does not use drugs.  Allergies  Allergen Reactions  . Procaine Hcl Other (See Comments)    Unknown    Family History  Problem Relation Age of Onset  . Heart disease Mother   . Heart disease Father        CABG  . Heart disease Brother        2 stents  . Ulcerative colitis Daughter   . Colon cancer Neg Hx     Prior to Admission medications   Medication Sig Start Date End Date Taking? Authorizing Provider  aspirin 81 MG tablet Take 81 mg by mouth daily.      [provider]  atorvastatin (LIPITOR) 80 MG tablet TAKE 1 TABLET (80 MG TOTAL) BY MOUTH DAILY AT 6 PM. 09/16/16   Crenshaw, Denice Bors, MD  Calcium Carbonate (CALCIUM 600) 1500 MG TABS Take 600 mg of elemental calcium by mouth daily.     [provider]  cholecalciferol (VITAMIN D) 1000 UNITS tablet Take 1,000 Units by mouth daily.    [provider]  estradiol (VAGIFEM) 25 MCG vaginal tablet Place 25 mcg vaginally 2 (two) times a week.     [provider]  fluticasone (FLONASE) 50 MCG/ACT nasal spray Place 2 sprays into both nostrils daily as needed for allergies. AS NEEDED    [provider]  isosorbide mononitrate (IMDUR) 30 MG 24 hr tablet Take 1 tablet (30 mg total) by mouth daily. 03/09/16   Lelon Perla, MD  Loratadine (CLARITIN) 10 MG CAPS Take 10 mg by mouth daily. 24 hr tabs    [provider]  meclizine (ANTIVERT) 25 MG tablet Take 1 tablet (25 mg total) by mouth 3 (three) times daily as needed for dizziness. 08/15/16   Tanna Furry, MD  metoprolol succinate (TOPROL-XL) 25 MG 24 hr tablet TAKE 1 TABLET BY MOUTH EVERY DAY 10/14/16   Lelon Perla, MD  nitroGLYCERIN (NITROSTAT) 0.4 MG SL tablet Place 1 tablet (0.4 mg total) under the tongue every 5 (five) minutes as needed. MAX 3 doses 10/27/16   Lelon Perla, MD  ondansetron (ZOFRAN ODT) 4 MG disintegrating  tablet Take 1 tablet (4 mg total) by mouth every 8 (eight) hours as needed for nausea. 08/15/16   Tanna Furry, MD    Physical Exam:  Constitutional:Elderly female who appears significantly younger than stated age in NAD, calm, comfortable Vitals:   10/27/16 2303 10/28/16 0100 10/28/16 0115 10/28/16 0130  BP: (!) 145/72 (!) 147/65 (!) 147/65 (!) 142/74  Pulse: 64 60 (!) 58 (!) 57  Resp: 18 14  10   Temp: 97.7 F (36.5 C)     TempSrc: Oral     SpO2: 97% 98% 98% 98%   Eyes: PERRL, lids and conjunctivae normal ENMT: Mucous membranes are moist. Posterior pharynx clear of any exudate or lesions. Neck: normal, supple, no masses, no thyromegaly Respiratory: clear to auscultation bilaterally,  no wheezing, no crackles. Normal respiratory effort. No accessory muscle use.  Cardiovascular: Bradycardic Positive Systolic murmurs. no rubs / gallops. No extremity edema. 2+ pedal pulses. No carotid bruits.  Abdomen: no tenderness, no masses palpated. No hepatosplenomegaly. Bowel sounds positive.  Musculoskeletal: no clubbing / cyanosis. No joint deformity upper and lower extremities. Good ROM, no contractures. Normal muscle tone.  Skin: no rashes, lesions, ulcers. No induration Neurologic: CN 2-12 grossly intact. Sensation intact, DTR normal. Strength 5/5 in all 4.  Psychiatric: Normal judgment and insight. Alert and oriented x 3. Normal mood.     Labs on Admission: I have personally reviewed following labs and imaging studies  CBC:  Recent Labs Lab 10/27/16 2318  WBC 8.1  HGB 13.9  HCT 42.6  MCV 79.6  PLT 163   Basic Metabolic Panel:  Recent Labs Lab 10/27/16 2318  NA 137  K 3.6  CL 104  CO2 26  GLUCOSE 96  BUN 20  CREATININE 0.87  CALCIUM 9.4   GFR: CrCl cannot be calculated (Unknown ideal weight.). Liver Function Tests: No results for input(s): AST, ALT, ALKPHOS, BILITOT, PROT, ALBUMIN in the last 168 hours. No results for input(s): LIPASE, AMYLASE in the last 168 hours. No  results for input(s): AMMONIA in the last 168 hours. Coagulation Profile: No results for input(s): INR, PROTIME in the last 168 hours. Cardiac Enzymes: No results for input(s): CKTOTAL, CKMB, CKMBINDEX, TROPONINI in the last 168 hours. BNP (last 3 results) No results for input(s): PROBNP in the last 8760 hours. HbA1C: No results for input(s): HGBA1C in the last 72 hours. CBG: No results for input(s): GLUCAP in the last 168 hours. Lipid Profile: No results for input(s): CHOL, HDL, LDLCALC, TRIG, CHOLHDL, LDLDIRECT in the last 72 hours. Thyroid Function Tests: No results for input(s): TSH, T4TOTAL, FREET4, T3FREE, THYROIDAB in the last 72 hours. Anemia Panel: No results for input(s): VITAMINB12, FOLATE, FERRITIN, TIBC, IRON, RETICCTPCT in the last 72 hours. Urine analysis:    Component Value Date/Time   COLORURINE yellow 12/10/2008 0902   APPEARANCEUR Clear 12/10/2008 0902   LABSPEC >=1.030 12/10/2008 0902   PHURINE 5.0 12/10/2008 0902   HGBUR trace-lysed 12/10/2008 0902   BILIRUBINUR n 11/13/2015 1118   PROTEINUR n 11/13/2015 1118   UROBILINOGEN 0.2 11/13/2015 1118   UROBILINOGEN 0.2 12/10/2008 0902   NITRITE n 11/13/2015 1118   NITRITE negative 12/10/2008 0902   LEUKOCYTESUR Negative 11/13/2015 1118   Sepsis Labs: No results found for this or any previous visit (from the past 240 hour(s)).   Radiological Exams on Admission: Dg Chest 2 View  Result Date: 10/28/2016 CLINICAL DATA:  Chest pain EXAM: CHEST  2 VIEW COMPARISON:  Chest radiograph 08/15/2016 FINDINGS: The heart size and mediastinal contours are within normal limits. Both lungs are clear. The visualized skeletal structures are unremarkable. IMPRESSION: No active cardiopulmonary disease. Electronically Signed   By: Ulyses Jarred M.D.   On: 10/28/2016 00:09    EKG: Independently reviewed. Normal sinus rhythm  Assessment/Plan Chest pain/question unstable angina: Acute. Patient reports intermittent chest pain. Heart  score= 5. - Admit to a telemetry bed - Checking cardiac enzymes - Aspirin  - Continue isosorbide mononitrate  - Check echocardiogram in a.m.  - Check lipid panel in a.m. - Message sent to Gay Filler for Cardiology Consult  H/O CAD status post stent, aortic insufficiency  Essential hypertension - Continue metoprolol   Hyperlipidemia - continue atorvastatin   DVT prophylaxis: Lovenox  Code Status: full Family Communication: No family  present at bedside Disposition Plan: Possible discharge home if workup negative. Consults called: none Admission status: Observation  Norval Morton MD [facility] Pager [phone]  If 7PM-7AM, please contact night-coverage [email or web address] Password [email or web address]  10/28/2016, 2:15 AM

## 2016-10-28 NOTE — Progress Notes (Signed)
Pt notified of nuc results and will follow up with Dr. Stanford Breed 11/12/16 though if continued pain she will call us in the mean time.

## 2016-10-28 NOTE — Progress Notes (Signed)
Patient states she wants to rest before the physicians come and talk to her, wants to be left alone for about an hour.

## 2016-10-28 NOTE — ED Provider Notes (Signed)
By signing my name below, I, Ephriam Jenkins, attest that this documentation has been prepared under the direction and in the presence of Ward, Delice Bison, DO. Electronically signed, Ephriam Jenkins, ED Scribe. 10/28/16. 2:28 AM.  TIME SEEN: 1:45 AM  CHIEF COMPLAINT: Chest Pain  HPI:  HPI Comments: Anne Martin is a 71 y.o. female with Hx of CAD s/p stent 6 years ago, who presents to the Emergency Department complaining of gradually worsening, intermittent, generalized chest discomfort with associated dyspnea which started one month ago and worsened this afternoon. Pt describes the pain as pressure and tightness, "like a rubber band around her chest". She presents to the ED this evening for worsened chest pressure while picking weeds today. Pt notes that she has had similar intermittent pain for the past month but today was worse than previous episodes. She was seen here previously when these symptoms started and was told that it was musculoskeletal pain. Today she called her PCP who told her to take her rx NTG. She took 3 NTG total, 5 minutes apart which did not relieve her symptoms immediately but resolved her chest discomfort on the way here and is currently not experiencing the discomfort. She also took 2 ibuprofen earlier today which did not resolve her symptoms. Within the past month pt has noticed shortness of breath when walking up hills but notes that it has been worsening since the episodes started one month ago. She states over the past month she has also experienced tightness in her chest that has been exertional and has progressively gotten worse. She has Hx of stent placed 6 years ago and her last stress test was approximately one year ago which was normal. No acute lower extremity swelling. No nausea, vomiting, dizziness or diaphoresis.   PCP: Dr. Nydia Bouton. Cardiologist: Dr. Jene Every  ROS: See HPI Constitutional: no fever  Eyes: no drainage  ENT: no runny nose   Cardiovascular:  + chest  pain (pressure, none currently) Resp: + SOB (intermittent, none currently) GI: no vomiting GU: no dysuria Integumentary: no rash  Allergy: no hives  Musculoskeletal: no leg swelling  Neurological: no slurred speech ROS otherwise negative  PAST MEDICAL HISTORY/PAST SURGICAL HISTORY:  Past Medical History:  Diagnosis Date  . Allergy    seasonal  . Anemia    prior to hysterectomy, fibroids  . Aortic insufficiency    a. 10/2015 Echo: mild to moderate AI.  Marland Kitchen Aortic stenosis    a. 10/2015 Echo: moderate AS.  Marland Kitchen Blood transfusion without reported diagnosis    with hysterectomy  . CAD (coronary artery disease)    a. 08/2009 s/p cath after abnormal nuclear study; LM nl, LAD 60%, D2 90% (PCI/DES), LCX 50 -60%, RCA nl, nl EF;  b. 2012 MV: no ischemia/infarct;  c. 08/2015 MV: no ischemia/infarct, EF 70%.  . Diastolic dysfunction    a. 10/2015 Echo: EF 55-60%, no rwma, Gr1 DD, mod AS, mild to mod AI.  Marland Kitchen Essential hypertension   . H/O: rheumatic fever   . History of colonoscopy   . Hyperlipidemia   . Osteopenia    last DEXA 11-22-13     MEDICATIONS:  Prior to Admission medications   Medication Sig Start Date End Date Taking? Authorizing Provider  aspirin 81 MG tablet Take 81 mg by mouth daily.      [provider]  atorvastatin (LIPITOR) 80 MG tablet TAKE 1 TABLET (80 MG TOTAL) BY MOUTH DAILY AT 6 PM. 09/16/16   Crenshaw, Denice Bors, MD  Calcium Carbonate (CALCIUM 600) 1500 MG TABS Take 600 mg of elemental calcium by mouth daily.     [provider]  cholecalciferol (VITAMIN D) 1000 UNITS tablet Take 1,000 Units by mouth daily.    [provider]  estradiol (VAGIFEM) 25 MCG vaginal tablet Place 25 mcg vaginally 2 (two) times a week.     [provider]  fluticasone (FLONASE) 50 MCG/ACT nasal spray Place 2 sprays into both nostrils daily as needed for allergies. AS NEEDED    [provider]  isosorbide mononitrate (IMDUR) 30 MG 24 hr tablet Take 1 tablet  (30 mg total) by mouth daily. 03/09/16   Lelon Perla, MD  Loratadine (CLARITIN) 10 MG CAPS Take 10 mg by mouth daily. 24 hr tabs    [provider]  meclizine (ANTIVERT) 25 MG tablet Take 1 tablet (25 mg total) by mouth 3 (three) times daily as needed for dizziness. 08/15/16   Tanna Furry, MD  metoprolol succinate (TOPROL-XL) 25 MG 24 hr tablet TAKE 1 TABLET BY MOUTH EVERY DAY 10/14/16   Lelon Perla, MD  nitroGLYCERIN (NITROSTAT) 0.4 MG SL tablet Place 1 tablet (0.4 mg total) under the tongue every 5 (five) minutes as needed. MAX 3 doses 10/27/16   Lelon Perla, MD  ondansetron (ZOFRAN ODT) 4 MG disintegrating tablet Take 1 tablet (4 mg total) by mouth every 8 (eight) hours as needed for nausea. 08/15/16   Tanna Furry, MD   ALLERGIES:  Allergies  Allergen Reactions  . Procaine Hcl Other (See Comments)    Unknown    SOCIAL HISTORY:  Social History  Substance Use Topics  . Smoking status: Former Smoker    Types: Cigarettes    Quit date: 04/12/1977  . Smokeless tobacco: Never Used     Comment: quit age 38  . Alcohol use 0.0 oz/week     Comment: rarely    FAMILY HISTORY: Family History  Problem Relation Age of Onset  . Heart disease Mother   . Heart disease Father        CABG  . Heart disease Brother        2 stents  . Ulcerative colitis Daughter   . Colon cancer Neg Hx     EXAM: BP (!) 142/74   Pulse (!) 57   Temp 97.7 F (36.5 C) (Oral)   Resp 10   SpO2 98%  CONSTITUTIONAL: Alert and oriented and responds appropriately to questions. Well-appearing; well-nourished. Appears younger than stated age.  HEAD: Normocephalic EYES: Conjunctivae clear, pupils appear equal, EOMI ENT: normal nose; moist mucous membranes NECK: Supple, no meningismus, no nuchal rigidity, no LAD  CARD: RRR; S1 and S2 appreciated; no murmurs, no clicks, no rubs, no gallops RESP: Normal chest excursion without splinting or tachypnea; breath sounds clear and equal bilaterally; no  wheezes, no rhonchi, no rales, no hypoxia or respiratory distress, speaking full sentences ABD/GI: Normal bowel sounds; non-distended; soft, non-tender, no rebound, no guarding, no peritoneal signs, no hepatosplenomegaly BACK:  The back appears normal and is non-tender to palpation, there is no CVA tenderness EXT: Normal ROM in all joints; non-tender to palpation; no edema; normal capillary refill; no cyanosis, no calf tenderness or swelling    SKIN: Normal color for age and race; warm; no rash NEURO: Moves all extremities equally PSYCH: The patient's mood and manner are appropriate. Grooming and personal hygiene are appropriate.  MEDICAL DECISION MAKING: Patient here with concerning story for chest pain. Has history of CAD with  a stent. Pain is exertional with shortness of breath. No pain currently after 3 nitroglycerin. Will give aspirin. We'll obtain cardiac labs. Anticipate admission.  ED PROGRESS: Chest x-ray clear. Troponin negative. EKG shows no ischemic abnormality. Patient is still chest pain-free. Will admit.   2:20 AM Discussed patient's case with hospitalist, Dr. Tamala Julian.  I have recommended admission and patient (and family if present) agree with this plan. Admitting physician will place admission orders.   I reviewed all nursing notes, vitals, pertinent previous records, EKGs, lab and urine results, imaging (as available).    EKG Interpretation  Date/Time:  Wednesday October 27 2016 22:50:52 EDT Ventricular Rate:  63 PR Interval:  156 QRS Duration: 86 QT Interval:  392 QTC Calculation: 401 R Axis:   84 Text Interpretation:  Normal sinus rhythm Normal ECG No significant change since last tracing Confirmed by Ward, Cyril Mourning [id number]) on 10/28/2016 1:05:38 AM       This chart was scribed in my presence and reviewed by me personally.      Ward, Delice Bison, DO 10/28/16 [phone]

## 2016-10-28 NOTE — Progress Notes (Signed)
Stress portion of stress test completed without complications.

## 2016-10-28 NOTE — ED Notes (Signed)
Pt ambulated to bathroom independently

## 2016-10-28 NOTE — Progress Notes (Signed)
Patient admitted after midnight, please see H&P.  For stress test today and no further work up if low risk  Eulogio Bear DO

## 2016-11-01 NOTE — Discharge Summary (Signed)
Physician Discharge Summary  ANJELIKA AUSBURN GHW:[id number] DOB: 15-Sep-1945 DOA: 10/28/2016  PCP: Laurey Morale, MD  Admit date: 10/28/2016 Discharge date: 11/01/2016   Recommendations for Outpatient Follow-Up:   1. outpt cardiology follow up   Discharge Diagnosis:   Principal Problem:   Chest pain Active Problems:   Hyperlipidemia   CAD, NATIVE VESSEL   Discharge disposition:  Home:  Discharge Condition: Improved.  Diet recommendation: Low sodium, heart healthy  Wound care: None.   History of Present Illness:   Anne Martin is a 71 y.o. female with medical history significant of CAD s/p stent 7 years ago, diastolic dysfunction is EF 55-60% , H/O rheumatic heart disease, aortic stenosis, and aortic insufficiency; who presents with complaints of intermittent chest pain over the last 1 month. Patient reports chest pain symptoms can occur on the left and right side of her chest. Describes symptoms sometimes feeling like a "fist pressing" on her chest/ back, and sometimes feels "like a rubber band wrapped around her chest". Previously, symptoms had only occurred when the patient was exerting herself like walking up a hill. He reports associated symptoms of shortness of breath She had been taking Aleve and Advil as needed with some improvement of symptoms. She had previously been evaluated back in 02/2016 for similar chest pain symptoms and was told at that time was told that it was musculoskeletal in nature. However, yesterday she experienced substernal chest discomfort while laying down. She called her PCP who recommended her to take 3 nitroglycerin to see if symptoms are relieved. At its worst the chest pain was a 7-8 /10 and following the nitroglycerin was down to a 4/10. Patient does report that she's been on the few trips here recently including a 10 hour drive for a college reunion in the last week or so, in a beach trip in June. Denies having any significant diaphoresis, orthopnea,  leg swelling, calf pain, fever, chills, cough, or dysuria.   Hospital Course by Problem:   Chest pain -low risk stress test -outpatient cardiology follow up  H/O CAD status post stent, aortic insufficiency  Essential hypertension - Continue metoprolol   Hyperlipidemia - continue atorvastatin   Medical Consultants:    cards   Discharge Exam:   Vitals:   10/28/16 1245 10/28/16 1604  BP: 131/72 107/77  Pulse:  88  Resp:  18  Temp:  98 F (36.7 C)   Vitals:   10/28/16 1242 10/28/16 1244 10/28/16 1245 10/28/16 1604  BP: (!) 145/76 (!) 141/74 131/72 107/77  Pulse:    88  Resp:    18  Temp:    98 F (36.7 C)  TempSrc:    Oral  SpO2:    98%  Weight:      Height:        Gen:  NAD   The results of significant diagnostics from this hospitalization (including imaging, microbiology, ancillary and laboratory) are listed below for reference.     Procedures and Diagnostic Studies:   Nm Myocar Multi W/spect W/wall Motion / Ef  Result Date: 10/28/2016 CLINICAL DATA:  Chest pain EXAM: MYOCARDIAL IMAGING WITH SPECT (REST AND PHARMACOLOGIC-STRESS) GATED LEFT VENTRICULAR WALL MOTION STUDY LEFT VENTRICULAR EJECTION FRACTION TECHNIQUE: Standard myocardial SPECT imaging was performed after resting intravenous injection of 10 mCi Tc-23m tetrofosmin. Subsequently, intravenous infusion of Lexiscan was performed under the supervision of the Cardiology staff. At peak effect of the drug, 30 mCi Tc-46m tetrofosmin was injected intravenously and standard myocardial SPECT imaging  was performed. Quantitative gated imaging was also performed to evaluate left ventricular wall motion, and estimate left ventricular ejection fraction. COMPARISON:  None. FINDINGS: Perfusion: Allowing for breast attenuation artifact, there are no perfusion defects. Wall Motion: Normal left ventricular wall motion. No left ventricular dilation. Left Ventricular Ejection Fraction: 77 % End diastolic volume 65 ml End  systolic volume 15 ml IMPRESSION: 1. No reversible ischemia or infarction. 2. Normal left ventricular wall motion. 3. Left ventricular ejection fraction 77% 4. Non invasive risk stratification*: Low risk *2012 Appropriate Use Criteria for Coronary Revascularization Focused Update: J Am Coll Cardiol. 4259;56(3):875-643. [email or web address] Electronically Signed   By: Marybelle Killings M.D.   On: 10/28/2016 15:46     Labs:   Basic Metabolic Panel:  Recent Labs Lab 10/27/16 2318  NA 137  K 3.6  CL 104  CO2 26  GLUCOSE 96  BUN 20  CREATININE 0.87  CALCIUM 9.4   GFR Estimated Creatinine Clearance: 62.9 mL/min (by C-G formula based on SCr of 0.87 mg/dL). Liver Function Tests: No results for input(s): AST, ALT, ALKPHOS, BILITOT, PROT, ALBUMIN in the last 168 hours. No results for input(s): LIPASE, AMYLASE in the last 168 hours. No results for input(s): AMMONIA in the last 168 hours. Coagulation profile No results for input(s): INR, PROTIME in the last 168 hours.  CBC:  Recent Labs Lab 10/27/16 2318  WBC 8.1  HGB 13.9  HCT 42.6  MCV 79.6  PLT 198   Cardiac Enzymes:  Recent Labs Lab 10/28/16 0303 10/28/16 0602 10/28/16 0845  TROPONINI <0.03 <0.03 <0.03   BNP: Invalid input(s): POCBNP CBG: No results for input(s): GLUCAP in the last 168 hours. D-Dimer No results for input(s): DDIMER in the last 72 hours. Hgb A1c No results for input(s): HGBA1C in the last 72 hours. Lipid Profile No results for input(s): CHOL, HDL, LDLCALC, TRIG, CHOLHDL, LDLDIRECT in the last 72 hours. Thyroid function studies No results for input(s): TSH, T4TOTAL, T3FREE, THYROIDAB in the last 72 hours.  Invalid input(s): FREET3 Anemia work up No results for input(s): VITAMINB12, FOLATE, FERRITIN, TIBC, IRON, RETICCTPCT in the last 72 hours. Microbiology No results found for this or any previous visit (from the past 240 hour(s)).   Discharge Instructions:    Discharge Instructions    Diet - low sodium heart healthy    Complete by:  As directed    Increase activity slowly    Complete by:  As directed      Allergies as of 10/28/2016      Reactions   Procaine Hcl Other (See Comments)   Unknown      Medication List    TAKE these medications   aspirin 81 MG tablet Take 81 mg by mouth daily.   atorvastatin 80 MG tablet Commonly known as:  LIPITOR TAKE 1 TABLET (80 MG TOTAL) BY MOUTH DAILY AT 6 PM.   CALCIUM 600 1500 (600 Ca) MG Tabs tablet Generic drug:  calcium carbonate Take 600 mg of elemental calcium by mouth daily.   cholecalciferol 1000 units tablet Commonly known as:  VITAMIN D Take 1,000 Units by mouth daily.   CLARITIN 10 MG Caps Generic drug:  Loratadine Take 10 mg by mouth daily. 24 hr tabs   FLONASE 50 MCG/ACT nasal spray Generic drug:  fluticasone Place 2 sprays into both nostrils daily as needed for allergies. AS NEEDED   isosorbide mononitrate 30 MG 24 hr tablet Commonly known as:  IMDUR Take 1 tablet (30 mg total) by mouth daily.  meclizine 25 MG tablet Commonly known as:  ANTIVERT Take 1 tablet (25 mg total) by mouth 3 (three) times daily as needed for dizziness.   metoprolol succinate 25 MG 24 hr tablet Commonly known as:  TOPROL-XL TAKE 1 TABLET BY MOUTH EVERY DAY   nitroGLYCERIN 0.4 MG SL tablet Commonly known as:  NITROSTAT Place 1 tablet (0.4 mg total) under the tongue every 5 (five) minutes as needed. MAX 3 doses   ondansetron 4 MG disintegrating tablet Commonly known as:  ZOFRAN ODT Take 1 tablet (4 mg total) by mouth every 8 (eight) hours as needed for nausea.   PROBIOTIC PO Take 1 tablet by mouth every morning.      Follow-up Information    Laurey Morale, MD In 1 week.   Specialty:  Family Medicine Contact information: [place] [address] [phone]        Lelon Perla, MD.   Specialty:  Cardiology Why:  8/3 Contact information: [address] [place] [address] [phone]            Time coordinating discharge: 35 min  Signed:  Jalyah Weinheimer Alison Stalling   [facility] 11/01/2016, 2:20 PM

## 2016-11-02 DIAGNOSIS — D225 Melanocytic nevi of trunk: Secondary | ICD-10-CM | POA: Diagnosis not present

## 2016-11-02 DIAGNOSIS — D485 Neoplasm of uncertain behavior of skin: Secondary | ICD-10-CM | POA: Diagnosis not present

## 2016-11-02 DIAGNOSIS — D2270 Melanocytic nevi of unspecified lower limb, including hip: Secondary | ICD-10-CM | POA: Diagnosis not present

## 2016-11-02 DIAGNOSIS — L821 Other seborrheic keratosis: Secondary | ICD-10-CM | POA: Diagnosis not present

## 2016-11-02 DIAGNOSIS — D2260 Melanocytic nevi of unspecified upper limb, including shoulder: Secondary | ICD-10-CM | POA: Diagnosis not present

## 2016-11-02 DIAGNOSIS — L700 Acne vulgaris: Secondary | ICD-10-CM | POA: Diagnosis not present

## 2016-11-02 DIAGNOSIS — L814 Other melanin hyperpigmentation: Secondary | ICD-10-CM | POA: Diagnosis not present

## 2016-11-11 NOTE — Progress Notes (Signed)
HPI: FU CAD. Cardiac catheterization was performed in May of 2011. This revealed Normal LM, 60 LAD, 90 D2, 50-60 LCx, normal RCA, and nl LV function. Patient had PCI of the diagonal with DES at that time. Carotid Dopplers in Oct 2013 were normal. Last echocardiogram July 2017 showed normal LV systolic function, grade 1 diastolic dysfunction, moderate aortic stenosis with mean gradient 18 mmHg, mild to moderate aortic insufficiency. Admitted with chest pain July 2018. Troponin and d-dimer normal. Nuclear study 7/18 showed ejection fraction 77% and normal perfusion. Since I last saw her, she has had no further chest pain. She has mild dyspnea on exertion but no orthopnea, PND, pedal edema or syncope.  Current Outpatient Prescriptions  Medication Sig Dispense Refill  . aspirin 81 MG tablet Take 81 mg by mouth daily.      Marland Kitchen atorvastatin (LIPITOR) 80 MG tablet TAKE 1 TABLET (80 MG TOTAL) BY MOUTH DAILY AT 6 PM. 90 tablet 3  . Calcium Carbonate (CALCIUM 600) 1500 MG TABS Take 600 mg of elemental calcium by mouth daily.     . cholecalciferol (VITAMIN D) 1000 UNITS tablet Take 1,000 Units by mouth daily.    . fluticasone (FLONASE) 50 MCG/ACT nasal spray Place 2 sprays into both nostrils daily as needed for allergies. AS NEEDED    . isosorbide mononitrate (IMDUR) 30 MG 24 hr tablet Take 1 tablet (30 mg total) by mouth daily. 90 tablet 3  . Loratadine (CLARITIN) 10 MG CAPS Take 10 mg by mouth daily. 24 hr tabs    . metoprolol succinate (TOPROL-XL) 25 MG 24 hr tablet TAKE 1 TABLET BY MOUTH EVERY DAY 90 tablet 1  . nitroGLYCERIN (NITROSTAT) 0.4 MG SL tablet Place 1 tablet (0.4 mg total) under the tongue every 5 (five) minutes as needed. MAX 3 doses 25 tablet 3  . Probiotic Product (PROBIOTIC PO) Take 1 tablet by mouth every morning.     No current facility-administered medications for this visit.      Past Medical History:  Diagnosis Date  . Allergy    seasonal  . Anemia    prior to  hysterectomy, fibroids  . Aortic insufficiency    a. 10/2015 Echo: mild to moderate AI.  Marland Kitchen Aortic stenosis    a. 10/2015 Echo: moderate AS.  Marland Kitchen Blood transfusion without reported diagnosis    with hysterectomy  . CAD (coronary artery disease)    a. 08/2009 s/p cath after abnormal nuclear study; LM nl, LAD 60%, D2 90% (PCI/DES), LCX 50 -60%, RCA nl, nl EF;  b. 2012 MV: no ischemia/infarct;  c. 08/2015 MV: no ischemia/infarct, EF 70%.  . Diastolic dysfunction    a. 10/2015 Echo: EF 55-60%, no rwma, Gr1 DD, mod AS, mild to mod AI.  Marland Kitchen Essential hypertension   . H/O: rheumatic fever   . History of colonoscopy   . Hyperlipidemia   . Osteopenia    last DEXA 11-22-13     Past Surgical History:  Procedure Laterality Date  . ANGIOPLASTY  may 2011   stents  . CHOLECYSTECTOMY  nov 2004  . COLONOSCOPY  12-22-10   per Dr. Henrene Pastor, adenomatous polyps, repeat in 5 yrs   . KNEE ARTHROSCOPY     Rt. Knee  . TONSILLECTOMY    . VAGINAL HYSTERECTOMY  1994    Social History   Social History  . Marital status: Married    Spouse name: N/A  . Number of children: 2  . Years of education:  N/A   Occupational History  . Retired Pharmacist, hospital   .  Retired   Social History Main Topics  . Smoking status: Former Smoker    Types: Cigarettes    Quit date: 04/12/1977  . Smokeless tobacco: Never Used     Comment: quit age 64  . Alcohol use 0.0 oz/week     Comment: rarely  . Drug use: No  . Sexual activity: Not Currently   Other Topics Concern  . Not on file   Social History Narrative  . No narrative on file    Family History  Problem Relation Age of Onset  . Heart disease Mother   . Heart disease Father        CABG  . Heart disease Brother        2 stents  . Ulcerative colitis Daughter   . Colon cancer Neg Hx     ROS: no fevers or chills, productive cough, hemoptysis, dysphasia, odynophagia, melena, hematochezia, dysuria, hematuria, rash, seizure activity, orthopnea, PND, pedal edema, claudication.  Remaining systems are negative.  Physical Exam: Well-developed well-nourished in no acute distress.  Skin is warm and dry.  HEENT is normal.  Neck is supple.  Chest is clear to auscultation with normal expansion.  Cardiovascular exam is regular rate and rhythm. 2/6 systolic murmur left sternal border. S2 is preserved. No diastolic murmur. Abdominal exam nontender or distended. No masses palpated. Extremities show no edema. neuro grossly intact   A/P  1 coronary artery disease-no further chest pain. Last nuclear study normal. Continue medical therapy. Continue aspirin and statin.  2 aortic stenosis/aortic insufficiency-plan repeat echocardiogram. I have explained that she may require aortic valve replacement in the future.  3 hyperlipidemia-continue statin. Check liver functions, GGT, 5'-nucleotidase. Alkaline phosphatase mildly elevated in the past.   Kirk Ruths, MD

## 2016-11-12 ENCOUNTER — Encounter: Payer: Self-pay | Admitting: Cardiology

## 2016-11-12 ENCOUNTER — Ambulatory Visit (INDEPENDENT_AMBULATORY_CARE_PROVIDER_SITE_OTHER): Payer: Medicare Other | Admitting: Cardiology

## 2016-11-12 VITALS — BP 132/70 | HR 65 | Ht 65.0 in | Wt 183.4 lb

## 2016-11-12 DIAGNOSIS — I350 Nonrheumatic aortic (valve) stenosis: Secondary | ICD-10-CM

## 2016-11-12 DIAGNOSIS — E7800 Pure hypercholesterolemia, unspecified: Secondary | ICD-10-CM | POA: Diagnosis not present

## 2016-11-12 DIAGNOSIS — I2510 Atherosclerotic heart disease of native coronary artery without angina pectoris: Secondary | ICD-10-CM | POA: Diagnosis not present

## 2016-11-12 NOTE — Patient Instructions (Signed)

## 2016-11-15 LAB — HEPATIC FUNCTION PANEL
ALT: 24 IU/L (ref 0–32)
AST: 29 IU/L (ref 0–40)
Albumin: 4.4 g/dL (ref 3.5–4.8)
Alkaline Phosphatase: 152 IU/L — ABNORMAL HIGH (ref 39–117)
Bilirubin Total: 0.5 mg/dL (ref 0.0–1.2)
Bilirubin, Direct: 0.17 mg/dL (ref 0.00–0.40)
Total Protein: 6.5 g/dL (ref 6.0–8.5)

## 2016-11-15 LAB — GAMMA GT: GGT: 15 IU/L (ref 0–60)

## 2016-11-15 LAB — NUCLEOTIDASE, 5', BLOOD: 5-Nucleotidase: 4 IU/L (ref 0–10)

## 2016-11-16 ENCOUNTER — Ambulatory Visit (INDEPENDENT_AMBULATORY_CARE_PROVIDER_SITE_OTHER): Payer: Medicare Other | Admitting: Family Medicine

## 2016-11-16 ENCOUNTER — Encounter: Payer: Self-pay | Admitting: Family Medicine

## 2016-11-16 ENCOUNTER — Other Ambulatory Visit: Payer: Self-pay

## 2016-11-16 ENCOUNTER — Ambulatory Visit (HOSPITAL_COMMUNITY): Payer: Medicare Other | Attending: Cardiology

## 2016-11-16 VITALS — BP 118/73 | HR 62 | Temp 97.8°F | Ht 65.0 in | Wt 183.0 lb

## 2016-11-16 DIAGNOSIS — E782 Mixed hyperlipidemia: Secondary | ICD-10-CM

## 2016-11-16 DIAGNOSIS — I350 Nonrheumatic aortic (valve) stenosis: Secondary | ICD-10-CM | POA: Diagnosis not present

## 2016-11-16 DIAGNOSIS — I2510 Atherosclerotic heart disease of native coronary artery without angina pectoris: Secondary | ICD-10-CM

## 2016-11-16 DIAGNOSIS — R0789 Other chest pain: Secondary | ICD-10-CM

## 2016-11-16 DIAGNOSIS — I10 Essential (primary) hypertension: Secondary | ICD-10-CM

## 2016-11-16 LAB — POC URINALSYSI DIPSTICK (AUTOMATED)
Bilirubin, UA: NEGATIVE
Blood, UA: NEGATIVE
Clarity, UA: NEGATIVE
Glucose, UA: NEGATIVE
Ketones, UA: NEGATIVE
Leukocytes, UA: NEGATIVE
Nitrite, UA: NEGATIVE
Protein, UA: NEGATIVE
Spec Grav, UA: >=1.03 — AB (ref 1.010–1.025)
Urobilinogen, UA: 0.2 E.U./dL
pH, UA: 6 (ref 5.0–8.0)

## 2016-11-16 LAB — ECHOCARDIOGRAM COMPLETE
Height: 65 in
Weight: 2928 oz

## 2016-11-16 LAB — CBC WITH DIFFERENTIAL/PLATELET
Basophils Absolute: 0.1 10*3/uL (ref 0.0–0.1)
Basophils Relative: 0.7 % (ref 0.0–3.0)
Eosinophils Absolute: 0.2 10*3/uL (ref 0.0–0.7)
Eosinophils Relative: 2.9 % (ref 0.0–5.0)
HCT: 45 % (ref 36.0–46.0)
Hemoglobin: 14.9 g/dL (ref 12.0–15.0)
Lymphocytes Relative: 24.3 % (ref 12.0–46.0)
Lymphs Abs: 1.9 10*3/uL (ref 0.7–4.0)
MCHC: 33 g/dL (ref 30.0–36.0)
MCV: 81.3 fl (ref 78.0–100.0)
Monocytes Absolute: 0.6 10*3/uL (ref 0.1–1.0)
Monocytes Relative: 7.8 % (ref 3.0–12.0)
Neutro Abs: 5.1 10*3/uL (ref 1.4–7.7)
Neutrophils Relative %: 64.3 % (ref 43.0–77.0)
Platelets: 217 10*3/uL (ref 150.0–400.0)
RBC: 5.54 Mil/uL — ABNORMAL HIGH (ref 3.87–5.11)
RDW: 14.9 % (ref 11.5–15.5)
WBC: 7.9 10*3/uL (ref 4.0–10.5)

## 2016-11-16 LAB — BASIC METABOLIC PANEL
BUN: 14 mg/dL (ref 6–23)
CO2: 29 mEq/L (ref 19–32)
Calcium: 9.6 mg/dL (ref 8.4–10.5)
Chloride: 104 mEq/L (ref 96–112)
Creatinine, Ser: 0.78 mg/dL (ref 0.40–1.20)
GFR: 77.22 mL/min (ref >60.00)
Glucose, Bld: 88 mg/dL (ref 70–99)
Potassium: 4.1 mEq/L (ref 3.5–5.1)
Sodium: 141 mEq/L (ref 135–145)

## 2016-11-16 LAB — TSH: TSH: 2.13 u[IU]/mL (ref 0.35–4.50)

## 2016-11-16 NOTE — Patient Instructions (Signed)
WE NOW OFFER   [facility] FAST TRACK!!!  SAME DAY Appointments for ACUTE CARE  Such as: Sprains, Injuries, cuts, abrasions, rashes, muscle pain, joint pain, back pain Colds, flu, sore throats, headache, allergies, cough, fever  Ear pain, sinus and eye infections Abdominal pain, nausea, vomiting, diarrhea, upset stomach Animal/insect bites  3 Easy Ways to Schedule: Walk-In Scheduling Call in scheduling Mychart Sign-up: [email or web address]         

## 2016-11-16 NOTE — Progress Notes (Signed)
   Subjective:    Patient ID: Anne Martin, female    DOB: 12/06/1945, 71 y.o.   MRN: [id number]  HPI Here to follow up several issues. First she was in the hospital from 10-28-16 to 11-01-16 for an episode of exertional chest pain. She had a myocardial perfusion study done which showed no ischemia. It was determined that the chest pain was musculoskeletal in nature. She has had no further issues with this. She had a good checkup with Dr. Lia Foyer recently and he plans for her to get an ECHO soon. Her BP has been stable.    Review of Systems  Constitutional: Negative.   HENT: Negative.   Eyes: Negative.   Respiratory: Negative.   Cardiovascular: Negative.   Gastrointestinal: Negative.   Genitourinary: Negative for decreased urine volume, difficulty urinating, dyspareunia, dysuria, enuresis, flank pain, frequency, hematuria, pelvic pain and urgency.  Musculoskeletal: Negative.   Skin: Negative.   Neurological: Negative.   Psychiatric/Behavioral: Negative.        Objective:   Physical Exam  Constitutional: She is oriented to person, place, and time. She appears well-developed and well-nourished. No distress.  HENT:  Head: Normocephalic and atraumatic.  Right Ear: External ear normal.  Left Ear: External ear normal.  Nose: Nose normal.  Mouth/Throat: Oropharynx is clear and moist. No oropharyngeal exudate.  Eyes: Pupils are equal, round, and reactive to light. Conjunctivae and EOM are normal. No scleral icterus.  Neck: Normal range of motion. Neck supple. No JVD present. No thyromegaly present.  Cardiovascular: Normal rate, regular rhythm, normal heart sounds and intact distal pulses.  Exam reveals no gallop and no friction rub.   No murmur heard. Pulmonary/Chest: Effort normal and breath sounds normal. No respiratory distress. She has no wheezes. She has no rales. She exhibits no tenderness.  Abdominal: Soft. Bowel sounds are normal. She exhibits no distension and no mass. There is no  tenderness. There is no rebound and no guarding.  Musculoskeletal: Normal range of motion. She exhibits no edema or tenderness.  Lymphadenopathy:    She has no cervical adenopathy.  Neurological: She is alert and oriented to person, place, and time. She has normal reflexes. No cranial nerve deficit. She exhibits normal muscle tone. Coordination normal.  Skin: Skin is warm and dry. No rash noted. No erythema.  Psychiatric: She has a normal mood and affect. Her behavior is normal. Judgment and thought content normal.          Assessment & Plan:  Her HTN is stable. She had some recent chest pain that seems to have been related to the chest wall. She will follow up prn. Get fasting labs today to check her lipids etc.

## 2016-12-30 ENCOUNTER — Encounter: Payer: Self-pay | Admitting: Family Medicine

## 2017-01-27 DIAGNOSIS — Z23 Encounter for immunization: Secondary | ICD-10-CM | POA: Diagnosis not present

## 2017-02-28 DIAGNOSIS — R350 Frequency of micturition: Secondary | ICD-10-CM | POA: Diagnosis not present

## 2017-02-28 DIAGNOSIS — N952 Postmenopausal atrophic vaginitis: Secondary | ICD-10-CM | POA: Diagnosis not present

## 2017-02-28 DIAGNOSIS — Z1231 Encounter for screening mammogram for malignant neoplasm of breast: Secondary | ICD-10-CM | POA: Diagnosis not present

## 2017-02-28 DIAGNOSIS — Z124 Encounter for screening for malignant neoplasm of cervix: Secondary | ICD-10-CM | POA: Diagnosis not present

## 2017-03-06 ENCOUNTER — Other Ambulatory Visit: Payer: Self-pay | Admitting: Cardiology

## 2017-03-07 NOTE — Telephone Encounter (Signed)
Rx has been sent to the pharmacy electronically. ° °

## 2017-03-29 ENCOUNTER — Other Ambulatory Visit: Payer: Self-pay | Admitting: Cardiology

## 2017-03-30 NOTE — Telephone Encounter (Signed)
Rx request sent to pharmacy.  

## 2017-04-19 DIAGNOSIS — E785 Hyperlipidemia, unspecified: Secondary | ICD-10-CM | POA: Diagnosis not present

## 2017-04-19 DIAGNOSIS — I2510 Atherosclerotic heart disease of native coronary artery without angina pectoris: Secondary | ICD-10-CM | POA: Diagnosis not present

## 2017-04-19 DIAGNOSIS — Z79899 Other long term (current) drug therapy: Secondary | ICD-10-CM | POA: Diagnosis not present

## 2017-04-19 DIAGNOSIS — R42 Dizziness and giddiness: Secondary | ICD-10-CM | POA: Diagnosis not present

## 2017-04-19 DIAGNOSIS — Z884 Allergy status to anesthetic agent status: Secondary | ICD-10-CM | POA: Diagnosis not present

## 2017-04-19 DIAGNOSIS — I10 Essential (primary) hypertension: Secondary | ICD-10-CM | POA: Diagnosis not present

## 2017-04-19 DIAGNOSIS — R51 Headache: Secondary | ICD-10-CM | POA: Diagnosis not present

## 2017-04-19 DIAGNOSIS — Z7982 Long term (current) use of aspirin: Secondary | ICD-10-CM | POA: Diagnosis not present

## 2017-04-26 DIAGNOSIS — R42 Dizziness and giddiness: Secondary | ICD-10-CM | POA: Diagnosis not present

## 2017-04-29 DIAGNOSIS — R42 Dizziness and giddiness: Secondary | ICD-10-CM | POA: Diagnosis not present

## 2017-05-06 DIAGNOSIS — R42 Dizziness and giddiness: Secondary | ICD-10-CM | POA: Diagnosis not present

## 2017-06-06 ENCOUNTER — Other Ambulatory Visit: Payer: Self-pay | Admitting: Cardiology

## 2017-07-15 ENCOUNTER — Telehealth: Payer: Self-pay | Admitting: Family Medicine

## 2017-07-15 MED ORDER — AZITHROMYCIN 250 MG PO TABS: ORAL_TABLET | ORAL | 0 refills | Status: DC

## 2017-07-15 NOTE — Telephone Encounter (Signed)
For a deep cough with yellow sputum, no fever

## 2017-07-15 NOTE — Telephone Encounter (Signed)
Copied from [place]. Topic: Quick Communication - See Telephone Encounter >> Jul 15, 2017  8:27 AM Arletha Grippe wrote: CRM for notification. See Telephone encounter for: 07/15/17. Pt husband was diagnosed with flu - [facility]  Pt is asking for tamiflu for herself as a phrophlactic.  [facility] [place]. Pt was told she needs to get this filled before end of day today in order for it to be helpful.  Cb is [phone]  Appt was offered, pt declined.

## 2017-07-22 DIAGNOSIS — R3915 Urgency of urination: Secondary | ICD-10-CM | POA: Diagnosis not present

## 2017-08-04 DIAGNOSIS — M2391 Unspecified internal derangement of right knee: Secondary | ICD-10-CM | POA: Insufficient documentation

## 2017-08-10 DIAGNOSIS — M25561 Pain in right knee: Secondary | ICD-10-CM | POA: Diagnosis not present

## 2017-08-10 DIAGNOSIS — M238X1 Other internal derangements of right knee: Secondary | ICD-10-CM | POA: Diagnosis not present

## 2017-08-16 ENCOUNTER — Ambulatory Visit (INDEPENDENT_AMBULATORY_CARE_PROVIDER_SITE_OTHER): Payer: Medicare Other | Admitting: Family Medicine

## 2017-08-16 ENCOUNTER — Encounter: Payer: Self-pay | Admitting: Family Medicine

## 2017-08-16 VITALS — BP 116/70 | HR 79 | Temp 88.6°F | Ht 65.0 in | Wt 183.6 lb

## 2017-08-16 DIAGNOSIS — J209 Acute bronchitis, unspecified: Secondary | ICD-10-CM | POA: Diagnosis not present

## 2017-08-16 MED ORDER — AZITHROMYCIN 250 MG PO TABS: ORAL_TABLET | ORAL | 0 refills | Status: DC

## 2017-08-16 MED ORDER — HYDROCODONE-HOMATROPINE 5-1.5 MG/5ML PO SYRP: 5.0000 mL | ORAL_SOLUTION | ORAL | 0 refills | Status: DC | PRN

## 2017-08-16 NOTE — Progress Notes (Signed)
   Subjective:    Patient ID: MAEBEL MARASCO, female    DOB: 10/30/1945, 72 y.o.   MRN: [id number]  HPI Here for 3 days of PND, ST, chest tightness and coughing up green sputum. On Mucinex.    Review of Systems  Constitutional: Negative.   HENT: Positive for congestion and sore throat. Negative for sinus pressure and sinus pain.   Eyes: Negative.   Respiratory: Positive for cough and chest tightness.        Objective:   Physical Exam  Constitutional: She appears well-developed and well-nourished.  HENT:  Right Ear: External ear normal.  Left Ear: External ear normal.  Nose: Nose normal.  Mouth/Throat: Oropharynx is clear and moist.  Eyes: Conjunctivae are normal.  Neck: No thyromegaly present.  Pulmonary/Chest: Effort normal. No respiratory distress. She has no wheezes. She has no rales.  Scattered rhonchi   Lymphadenopathy:    She has no cervical adenopathy.          Assessment & Plan:  Bronchitis, treat with a Zpack. Alysia Penna, MD

## 2017-08-22 ENCOUNTER — Ambulatory Visit: Payer: Self-pay | Admitting: *Deleted

## 2017-08-22 NOTE — Telephone Encounter (Signed)
Patient called in with c/o "dizziness and cough." She says "I saw Dr. Sarajane Jews last week and was treated for bronchitis. I have been still coughing and in the bed. Yesterday when I got up, I was dizzy. My balance feels off and I feel nauseous when standing. I've had vertigo in the past, the room is not spinning. I can walk normally, but feel a little off balance and nauseated when walking. Lying down makes it better and I had a headache over the weekend." I asked about other symptoms, she says "just nausea and this cough that's not better. I wonder if it's sinus problems or an infection." According to protocol, see PCP within 24 hours, appointment scheduled for tomorrow at 0815 with Dr. Sarajane Jews, care advice given, patient verbalized understanding.   Reason for Disposition . [1] MODERATE dizziness (e.g., vertigo; feels very unsteady, interferes with normal activities) AND [2] has NOT been evaluated by physician for this  Answer Assessment - Initial Assessment Questions 1. DESCRIPTION: "Describe your dizziness."     Balance if off 2. VERTIGO: "Do you feel like either you or the room is spinning or tilting?"      No 3. LIGHTHEADED: "Do you feel lightheaded?" (e.g., somewhat faint, woozy, weak upon standing)     Weak upon standing, balance issues, nauseated 4. SEVERITY: "How bad is it?"  "Can you walk?"   - MILD - Feels unsteady but walking normally.   - MODERATE - Feels very unsteady when walking, but not falling; interferes with normal activities (e.g., school, work) .   - SEVERE - Unable to walk without falling (requires assistance).     Mild-moderate 5. ONSET:  "When did the dizziness begin?"     Yesterday 6. AGGRAVATING FACTORS: "Does anything make it worse?" (e.g., standing, change in head position)     Lying down is better; standing is worse 7. CAUSE: "What do you think is causing the dizziness?"     I don't know 8. RECURRENT SYMPTOM: "Have you had dizziness before?" If so, ask: "When was the last  time?" "What happened that time?"     Yes, it was vertigo 9. OTHER SYMPTOMS: "Do you have any other symptoms?" (e.g., headache, weakness, numbness, vomiting, earache)     Nausea, headache over the weekend 10. PREGNANCY: "Is there any chance you are pregnant?" "When was your last menstrual period?"       No  Protocols used: DIZZINESS - VERTIGO-A-AH

## 2017-08-23 ENCOUNTER — Ambulatory Visit (INDEPENDENT_AMBULATORY_CARE_PROVIDER_SITE_OTHER): Payer: Medicare Other | Admitting: Family Medicine

## 2017-08-23 ENCOUNTER — Encounter: Payer: Self-pay | Admitting: Family Medicine

## 2017-08-23 VITALS — BP 104/60 | HR 68 | Temp 97.7°F | Ht 65.0 in | Wt 178.6 lb

## 2017-08-23 DIAGNOSIS — J019 Acute sinusitis, unspecified: Secondary | ICD-10-CM

## 2017-08-23 MED ORDER — METHYLPREDNISOLONE ACETATE 80 MG/ML IJ SUSP
80.0000 mg | Freq: Once | INTRAMUSCULAR | Status: AC
  Administered 2017-08-23: 120 mg via INTRAMUSCULAR

## 2017-08-23 MED ORDER — LEVOFLOXACIN 500 MG PO TABS: 500.0000 mg | ORAL_TABLET | Freq: Every day | ORAL | 0 refills | Status: AC

## 2017-08-23 NOTE — Progress Notes (Signed)
   Subjective:    Patient ID: Anne Martin, female    DOB: 1946/03/02, 72 y.o.   MRN: [id number]  HPI Here to follow up on bronchitis. She was here on 08-16-17 and was treated with a Zpack. Unfortunately this did not help much. She is still coughing up yellow sputum and she has a lot of sinus pressure. She has also had some vertigo, but she had Zofran and Meclizine at home to use for this.    Review of Systems  Constitutional: Negative.   HENT: Positive for congestion, postnasal drip, sinus pressure and sinus pain. Negative for sore throat.   Eyes: Negative.   Respiratory: Positive for cough.   Neurological: Positive for dizziness.       Objective:   Physical Exam  Constitutional: She appears well-developed and well-nourished.  HENT:  Right Ear: External ear normal.  Left Ear: External ear normal.  Nose: Nose normal.  Mouth/Throat: Oropharynx is clear and moist.  Eyes: Conjunctivae are normal.  Neck: No thyromegaly present.  Pulmonary/Chest: Effort normal and breath sounds normal. No stridor. No respiratory distress. She has no wheezes. She has no rales.  Lymphadenopathy:    She has no cervical adenopathy.          Assessment & Plan:  She has a sinusitis, treat with Levaquin and a steroid shot. Alysia Penna, MD

## 2017-09-14 ENCOUNTER — Telehealth: Payer: Self-pay | Admitting: Cardiology

## 2017-09-14 NOTE — Telephone Encounter (Signed)
New Message   Pt states she just revecied a letter stating that she was suppose to see Anne Martin in January but thought it was suppose to be a year which wouldn't  be until August. Please call

## 2017-09-14 NOTE — Telephone Encounter (Signed)
Returned call to patient, advised Dr. Stanford Breed wanted to see her back in 6 months after last OV.    Scheduled patient for 8/12 at 3pm with Dr. Stanford Breed

## 2017-09-16 ENCOUNTER — Other Ambulatory Visit: Payer: Self-pay | Admitting: Cardiology

## 2017-09-27 DIAGNOSIS — L821 Other seborrheic keratosis: Secondary | ICD-10-CM | POA: Diagnosis not present

## 2017-09-27 DIAGNOSIS — L814 Other melanin hyperpigmentation: Secondary | ICD-10-CM | POA: Diagnosis not present

## 2017-09-27 DIAGNOSIS — D225 Melanocytic nevi of trunk: Secondary | ICD-10-CM | POA: Diagnosis not present

## 2017-09-27 DIAGNOSIS — D2260 Melanocytic nevi of unspecified upper limb, including shoulder: Secondary | ICD-10-CM | POA: Diagnosis not present

## 2017-09-27 DIAGNOSIS — D2270 Melanocytic nevi of unspecified lower limb, including hip: Secondary | ICD-10-CM | POA: Diagnosis not present

## 2017-09-27 DIAGNOSIS — Z86018 Personal history of other benign neoplasm: Secondary | ICD-10-CM | POA: Diagnosis not present

## 2017-09-27 DIAGNOSIS — L700 Acne vulgaris: Secondary | ICD-10-CM | POA: Diagnosis not present

## 2017-11-03 ENCOUNTER — Other Ambulatory Visit: Payer: Self-pay | Admitting: *Deleted

## 2017-11-03 DIAGNOSIS — I350 Nonrheumatic aortic (valve) stenosis: Secondary | ICD-10-CM

## 2017-11-04 ENCOUNTER — Telehealth: Payer: Self-pay | Admitting: Family Medicine

## 2017-11-04 ENCOUNTER — Ambulatory Visit (INDEPENDENT_AMBULATORY_CARE_PROVIDER_SITE_OTHER): Payer: Medicare Other | Admitting: Family

## 2017-11-04 ENCOUNTER — Encounter: Payer: Self-pay | Admitting: Family

## 2017-11-04 ENCOUNTER — Other Ambulatory Visit: Payer: Self-pay | Admitting: Family

## 2017-11-04 VITALS — BP 110/64 | HR 66 | Temp 97.6°F | Ht 65.0 in | Wt 182.0 lb

## 2017-11-04 DIAGNOSIS — L089 Local infection of the skin and subcutaneous tissue, unspecified: Secondary | ICD-10-CM | POA: Diagnosis not present

## 2017-11-04 MED ORDER — MUPIROCIN 2 % EX OINT: TOPICAL_OINTMENT | CUTANEOUS | 0 refills | Status: DC

## 2017-11-04 MED ORDER — MUPIROCIN CALCIUM 2 % EX CREA: 1.0000 "application " | TOPICAL_CREAM | Freq: Two times a day (BID) | CUTANEOUS | 0 refills | Status: DC

## 2017-11-04 MED ORDER — SULFAMETHOXAZOLE-TRIMETHOPRIM 800-160 MG PO TABS: 1.0000 | ORAL_TABLET | Freq: Two times a day (BID) | ORAL | 0 refills | Status: DC

## 2017-11-04 NOTE — Progress Notes (Signed)
Anne Martin is a 72 y.o. female with the following history as recorded in [employer]:  Patient Active Problem List   Diagnosis Date Noted  . Chest pain 10/28/2016  . Aortic insufficiency 02/29/2016  . Essential hypertension 02/29/2016  . Musculoskeletal chest pain 02/28/2016  . Aortic stenosis 02/24/2012  . Hyperlipidemia 11/07/2009  . CAD, NATIVE VESSEL 09/15/2009  . CAROTID BRUIT 09/15/2009  . CONTACT DERMATITIS 01/28/2009  . Disorder of bone and cartilage 12/17/2008  . ALLERGIC RHINITIS 08/16/2007  . KNEE PAIN 08/16/2007  . History of cardiovascular disorder 08/16/2007    Current Outpatient Medications  Medication Sig Dispense Refill  . aspirin 81 MG tablet Take 81 mg by mouth daily.      Marland Kitchen atorvastatin (LIPITOR) 80 MG tablet TAKE 1 TABLET (80 MG TOTAL) BY MOUTH DAILY AT 6 PM. 90 tablet 3  . Calcium Carbonate (CALCIUM 600) 1500 MG TABS Take 600 mg of elemental calcium by mouth daily.     . cholecalciferol (VITAMIN D) 1000 UNITS tablet Take 1,000 Units by mouth daily.    . isosorbide mononitrate (IMDUR) 30 MG 24 hr tablet TAKE 1 TABLET (30 MG TOTAL) BY MOUTH DAILY. 90 tablet 2  . Loratadine (CLARITIN) 10 MG CAPS Take 10 mg by mouth daily. 24 hr tabs    . metoprolol succinate (TOPROL-XL) 25 MG 24 hr tablet Take 1 tablet (25 mg total) by mouth daily. TAKE 1 TABLET BY MOUTH EVERY DAY 90 tablet 1  . nitroGLYCERIN (NITROSTAT) 0.4 MG SL tablet Place 1 tablet (0.4 mg total) under the tongue every 5 (five) minutes as needed. MAX 3 doses 25 tablet 3  . Probiotic Product (PROBIOTIC PO) Take 1 tablet by mouth every morning.    . tretinoin (RETIN-A) 0.05 % cream Apply topically.    Merril Abbe 10 MCG TABS vaginal tablet INSERT 1 TABLET TWICE WEEKLY  1  . fluticasone (FLONASE) 50 MCG/ACT nasal spray Place 2 sprays into both nostrils daily as needed for allergies. AS NEEDED    . mupirocin cream (BACTROBAN) 2 % Apply 1 application topically 2 (two) times daily. 15 g 0  . mupirocin ointment  (BACTROBAN) 2 % Apply tid as directed 15 g 0  . sulfamethoxazole-trimethoprim (BACTRIM DS,SEPTRA DS) 800-160 MG tablet Take 1 tablet by mouth 2 (two) times daily. 10 tablet 0   No current facility-administered medications for this visit.     Allergies: Procaine hcl  Past Medical History:  Diagnosis Date  . Allergy    seasonal  . Anemia    prior to hysterectomy, fibroids  . Aortic insufficiency    a. 10/2015 Echo: mild to moderate AI.  Marland Kitchen Aortic stenosis    a. 10/2015 Echo: moderate AS.  Marland Kitchen Blood transfusion without reported diagnosis    with hysterectomy  . CAD (coronary artery disease)    a. 08/2009 s/p cath after abnormal nuclear study; LM nl, LAD 60%, D2 90% (PCI/DES), LCX 50 -60%, RCA nl, nl EF;  b. 2012 MV: no ischemia/infarct;  c. 08/2015 MV: no ischemia/infarct, EF 70%.  . Diastolic dysfunction    a. 10/2015 Echo: EF 55-60%, no rwma, Gr1 DD, mod AS, mild to mod AI.  Marland Kitchen Essential hypertension   . H/O: rheumatic fever   . History of colonoscopy   . Hyperlipidemia   . Osteopenia    last DEXA 11-22-13     Past Surgical History:  Procedure Laterality Date  . ANGIOPLASTY  may 2011   stents  . CHOLECYSTECTOMY  nov 2004  .  COLONOSCOPY  02/02/2016   per Dr. Henrene Pastor, adenomatous polyps, repeat in 5 yrs   . KNEE ARTHROSCOPY     Rt. Knee  . TONSILLECTOMY    . VAGINAL HYSTERECTOMY  1994    Family History  Problem Relation Age of Onset  . Heart disease Mother   . Heart disease Father        CABG  . Heart disease Brother        2 stents  . Ulcerative colitis Daughter   . Colon cancer Neg Hx     Social History   Tobacco Use  . Smoking status: Former Smoker    Types: Cigarettes    Last attempt to quit: 04/12/1977    Years since quitting: 40.5  . Smokeless tobacco: Never Used  . Tobacco comment: quit age 29  Substance Use Topics  . Alcohol use: Yes    Alcohol/week: 0.0 oz    Comment: rarely    Subjective:  Right heel wound x 3 weeks; injury occurred while having pedicure  done; notes that area is actually improving but concerned about length of time symptoms persisting/ mild drainage from wound;   Objective:  Vitals:   11/04/17 1016  BP: 110/64  Pulse: 66  Temp: 97.6 F (36.4 C)  TempSrc: Oral  SpO2: 97%  Weight: 182 lb (82.6 kg)  Height: 5\' 5"  (1.651 m)    General: Well developed, well nourished, in no acute distress  Skin : Warm and dry. Small laceration noted on right heel with mild erythema; Head: Normocephalic and atraumatic  Lungs: Respirations unlabored; Neurologic: Alert and oriented; speech intact; face symmetrical; moves all extremities well; CNII-XII intact without focal deficit   Assessment:  1. Skin infection     Plan:  Secondary to injury from pedicure; Tdap is up to date; will try treating with Bactroban tid/ keep area covered; if not improved by Monday, start oral antibiotics; follow-up worse, no better.   No follow-ups on file.  No orders of the defined types were placed in this encounter.   Requested Prescriptions   Signed Prescriptions Disp Refills  . mupirocin cream (BACTROBAN) 2 % 15 g 0    Sig: Apply 1 application topically 2 (two) times daily.  Marland Kitchen sulfamethoxazole-trimethoprim (BACTRIM DS,SEPTRA DS) 800-160 MG tablet 10 tablet 0    Sig: Take 1 tablet by mouth 2 (two) times daily.

## 2017-11-04 NOTE — Telephone Encounter (Signed)
Copied from [place] [phone]. Topic: Quick Communication - Rx Refill/Question >> Nov 04, 2017  2:54 PM Waylan Rocher, Lumin L wrote: Medication: mupirocin ointment (BACTROBAN) 2 % (requesting prior authorization to decrease the price, currently $200)  Has the patient contacted their pharmacy? Yes.   (Agent: If no, request that the patient contact the pharmacy for the refill.) (Agent: If yes, when and what did the pharmacy advise?)  Preferred Pharmacy (with phone number or street name): [id number] [place], [place] - [address] [place] [address] Phone: [phone] Fax: [phone]  Agent: Please be advised that RX refills may take up to 3 business days. We ask that you follow-up with your pharmacy.

## 2017-11-07 NOTE — Telephone Encounter (Signed)
Message sent to Dr Sarajane Jews for recommendations as this was not prescribed by a physician here.

## 2017-11-08 NOTE — Telephone Encounter (Signed)
Make sure it is regular Mupiricin ointment and NOT the nasal ointment (that is much more expensive)

## 2017-11-10 NOTE — Telephone Encounter (Signed)
I called [facility] and informed the pharmacist of the message below.  [name] resubmitted this again for the ointment, stated it went through without a prior and she will inform the pt.

## 2017-11-14 ENCOUNTER — Ambulatory Visit (INDEPENDENT_AMBULATORY_CARE_PROVIDER_SITE_OTHER): Payer: Medicare Other

## 2017-11-14 ENCOUNTER — Encounter: Payer: Self-pay | Admitting: Family Medicine

## 2017-11-14 ENCOUNTER — Ambulatory Visit (INDEPENDENT_AMBULATORY_CARE_PROVIDER_SITE_OTHER): Payer: Medicare Other | Admitting: Family Medicine

## 2017-11-14 VITALS — BP 114/64 | HR 67 | Temp 97.6°F | Ht 65.0 in | Wt 180.4 lb

## 2017-11-14 DIAGNOSIS — M79671 Pain in right foot: Secondary | ICD-10-CM

## 2017-11-14 NOTE — Progress Notes (Signed)
   Subjective:    Patient ID: Anne Martin, female    DOB: [birth year], 72 y.o.   MRN: [id number]  HPI Here to check her right heel. 4 weeks ago while at her nail salon, a blade was used to scrape excess skin off her heel. This caused a laceration that bled, so she was seen at the [facility] on [phone]. To avoid infection she was given 5 days of Bactrim DS and topical Mupiricin. The skin has healed nicely but she still has a fair amount of tenderness in the heel.    Review of Systems  Constitutional: Negative.   Respiratory: Negative.   Cardiovascular: Negative.   Skin: Positive for wound.       Objective:   Physical Exam  Constitutional: She appears well-developed and well-nourished.  Cardiovascular: Normal rate, regular rhythm, normal heart sounds and intact distal pulses.  Pulmonary/Chest: Effort normal and breath sounds normal.  Skin:  The right heel has an area of scabbing where the laceration has filled in. No warmth or erythema, however she is tender over the bottom of the heel           Assessment & Plan:  Heel pain after a laceration. There is no sign of infection on exam today but because she is still tender I want to rule out osteomyelitis. We will get an Xray of the foot today.  Alysia Penna, MD

## 2017-11-17 ENCOUNTER — Ambulatory Visit (HOSPITAL_COMMUNITY): Payer: Medicare Other | Attending: Cardiovascular Disease

## 2017-11-17 ENCOUNTER — Other Ambulatory Visit: Payer: Self-pay

## 2017-11-17 DIAGNOSIS — E785 Hyperlipidemia, unspecified: Secondary | ICD-10-CM | POA: Insufficient documentation

## 2017-11-17 DIAGNOSIS — I2510 Atherosclerotic heart disease of native coronary artery without angina pectoris: Secondary | ICD-10-CM | POA: Insufficient documentation

## 2017-11-17 DIAGNOSIS — I350 Nonrheumatic aortic (valve) stenosis: Secondary | ICD-10-CM | POA: Insufficient documentation

## 2017-11-17 DIAGNOSIS — I10 Essential (primary) hypertension: Secondary | ICD-10-CM | POA: Diagnosis not present

## 2017-11-18 NOTE — Progress Notes (Signed)
HPI: FU CAD. Cardiac catheterization was performed in May of 2011. This revealed Normal LM, 60 LAD, 90 D2, 50-60 LCx, normal RCA, and nl LV function. Patient had PCI of the diagonal with DES at that time. Carotid Dopplers in Oct 2013 were normal. Nuclear study 7/18 showed ejection fraction 77% and normal perfusion.   Last echocardiogram performed August 2019 showed normal LV function, mild diastolic dysfunction, mild aortic stenosis with mean gradient 10 mmHg, moderate aortic insufficiency.  Since I last saw her, the patient denies any dyspnea on exertion, orthopnea, PND, pedal edema, palpitations, syncope or chest pain.   Current Outpatient Medications  Medication Sig Dispense Refill  . aspirin 81 MG tablet Take 81 mg by mouth daily.      Anne Martin atorvastatin (LIPITOR) 80 MG tablet TAKE 1 TABLET (80 MG TOTAL) BY MOUTH DAILY AT 6 PM. 90 tablet 3  . Calcium Carbonate (CALCIUM 600) 1500 MG TABS Take 600 mg of elemental calcium by mouth daily.     . cholecalciferol (VITAMIN D) 1000 UNITS tablet Take 1,000 Units by mouth daily.    . fluticasone (FLONASE) 50 MCG/ACT nasal spray Place 2 sprays into both nostrils daily as needed for allergies. AS NEEDED    . isosorbide mononitrate (IMDUR) 30 MG 24 hr tablet TAKE 1 TABLET (30 MG TOTAL) BY MOUTH DAILY. 90 tablet 2  . Loratadine (CLARITIN) 10 MG CAPS Take 10 mg by mouth daily. 24 hr tabs    . metoprolol succinate (TOPROL-XL) 25 MG 24 hr tablet Take 1 tablet (25 mg total) by mouth daily. TAKE 1 TABLET BY MOUTH EVERY DAY 90 tablet 1  . nitroGLYCERIN (NITROSTAT) 0.4 MG SL tablet Place 1 tablet (0.4 mg total) under the tongue every 5 (five) minutes as needed. MAX 3 doses 25 tablet 3  . Probiotic Product (PROBIOTIC PO) Take 1 tablet by mouth every morning.    Merril Abbe 10 MCG TABS vaginal tablet INSERT 1 TABLET TWICE WEEKLY  1   No current facility-administered medications for this visit.      Past Medical History:  Diagnosis Date  . Allergy    seasonal   . Anemia    prior to hysterectomy, fibroids  . Aortic insufficiency    a. 10/2015 Echo: mild to moderate AI.  Anne Martin Aortic stenosis    a. 10/2015 Echo: moderate AS.  Anne Martin Blood transfusion without reported diagnosis    with hysterectomy  . CAD (coronary artery disease)    a. 08/2009 s/p cath after abnormal nuclear study; LM nl, LAD 60%, D2 90% (PCI/DES), LCX 50 -60%, RCA nl, nl EF;  b. 2012 MV: no ischemia/infarct;  c. 08/2015 MV: no ischemia/infarct, EF 70%.  . Diastolic dysfunction    a. 10/2015 Echo: EF 55-60%, no rwma, Gr1 DD, mod AS, mild to mod AI.  Anne Martin Essential hypertension   . H/O: rheumatic fever   . History of colonoscopy   . Hyperlipidemia   . Osteopenia    last DEXA 11-22-13     Past Surgical History:  Procedure Laterality Date  . ANGIOPLASTY  may 2011   stents  . CHOLECYSTECTOMY  nov 2004  . COLONOSCOPY  02/02/2016   per Dr. Henrene Pastor, adenomatous polyps, repeat in 5 yrs   . KNEE ARTHROSCOPY     Rt. Knee  . TONSILLECTOMY    . VAGINAL HYSTERECTOMY  1994    Social History   Socioeconomic History  . Marital status: Married    Spouse name: Not on  file  . Number of children: 2  . Years of education: Not on file  . Highest education level: Not on file  Occupational History  . Occupation: Retired Product manager: RETIRED  Social Needs  . Financial resource strain: Not on file  . Food insecurity:    Worry: Not on file    Inability: Not on file  . Transportation needs:    Medical: Not on file    Non-medical: Not on file  Tobacco Use  . Smoking status: Former Smoker    Types: Cigarettes    Last attempt to quit: 04/12/1977    Years since quitting: 40.6  . Smokeless tobacco: Never Used  . Tobacco comment: quit age 3  Substance and Sexual Activity  . Alcohol use: Yes    Alcohol/week: 0.0 standard drinks    Comment: rarely  . Drug use: No  . Sexual activity: Not Currently  Lifestyle  . Physical activity:    Days per week: Not on file    Minutes per session: Not  on file  . Stress: Not on file  Relationships  . Social connections:    Talks on phone: Not on file    Gets together: Not on file    Attends religious service: Not on file    Active member of club or organization: Not on file    Attends meetings of clubs or organizations: Not on file    Relationship status: Not on file  . Intimate partner violence:    Fear of current or ex partner: Not on file    Emotionally abused: Not on file    Physically abused: Not on file    Forced sexual activity: Not on file  Other Topics Concern  . Not on file  Social History Narrative  . Not on file    Family History  Problem Relation Age of Onset  . Heart disease Mother   . Heart disease Father        CABG  . Heart disease Brother        2 stents  . Ulcerative colitis Daughter   . Colon cancer Neg Hx     ROS: no fevers or chills, productive cough, hemoptysis, dysphasia, odynophagia, melena, hematochezia, dysuria, hematuria, rash, seizure activity, orthopnea, PND, pedal edema, claudication. Remaining systems are negative.  Physical Exam: Well-developed well-nourished in no acute distress.  Skin is warm and dry.  HEENT is normal.  Neck is supple.  Chest is clear to auscultation with normal expansion.  Cardiovascular exam is regular rate and rhythm.  2/6 systolic and diastolic murmur. Abdominal exam nontender or distended. No masses palpated. Extremities show no edema. neuro grossly intact  ECG-sinus rhythm at a rate of 62.  No ST changes.  Personally reviewed  A/P  1 coronary disease-patient denies chest pain.  Plan continue medical therapy including aspirin and statin.  2 hyperlipidemia-continue statin.    3 history of aortic stenosis/aortic insufficiency-patient remains asymptomatic.  Will repeat echocardiogram when she returns in 1 year.  Kirk Ruths, MD

## 2017-11-21 ENCOUNTER — Ambulatory Visit (INDEPENDENT_AMBULATORY_CARE_PROVIDER_SITE_OTHER): Payer: Medicare Other | Admitting: Cardiology

## 2017-11-21 ENCOUNTER — Encounter: Payer: Self-pay | Admitting: Cardiology

## 2017-11-21 VITALS — BP 130/66 | HR 62 | Ht 65.0 in | Wt 182.0 lb

## 2017-11-21 DIAGNOSIS — I2510 Atherosclerotic heart disease of native coronary artery without angina pectoris: Secondary | ICD-10-CM | POA: Diagnosis not present

## 2017-11-21 DIAGNOSIS — I350 Nonrheumatic aortic (valve) stenosis: Secondary | ICD-10-CM

## 2017-11-21 DIAGNOSIS — E7800 Pure hypercholesterolemia, unspecified: Secondary | ICD-10-CM

## 2017-11-21 NOTE — Patient Instructions (Signed)
Your physician wants you to follow-up in: ONE YEAR WITH DR CRENSHAW You will receive a reminder letter in the mail two months in advance. If you don't receive a letter, please call our office to schedule the follow-up appointment.   If you need a refill on your cardiac medications before your next appointment, please call your pharmacy.  

## 2017-12-15 ENCOUNTER — Other Ambulatory Visit: Payer: Self-pay | Admitting: Cardiology

## 2017-12-28 ENCOUNTER — Encounter: Payer: Medicare Other | Admitting: Family Medicine

## 2018-01-05 ENCOUNTER — Ambulatory Visit (INDEPENDENT_AMBULATORY_CARE_PROVIDER_SITE_OTHER): Payer: Medicare Other | Admitting: Family Medicine

## 2018-01-05 ENCOUNTER — Encounter: Payer: Self-pay | Admitting: Family Medicine

## 2018-01-05 VITALS — BP 134/82 | HR 66 | Temp 97.7°F | Ht 65.0 in | Wt 180.4 lb

## 2018-01-05 DIAGNOSIS — I10 Essential (primary) hypertension: Secondary | ICD-10-CM

## 2018-01-05 DIAGNOSIS — E782 Mixed hyperlipidemia: Secondary | ICD-10-CM | POA: Diagnosis not present

## 2018-01-05 DIAGNOSIS — I350 Nonrheumatic aortic (valve) stenosis: Secondary | ICD-10-CM | POA: Diagnosis not present

## 2018-01-05 DIAGNOSIS — Z23 Encounter for immunization: Secondary | ICD-10-CM | POA: Diagnosis not present

## 2018-01-05 DIAGNOSIS — I351 Nonrheumatic aortic (valve) insufficiency: Secondary | ICD-10-CM

## 2018-01-05 DIAGNOSIS — I2510 Atherosclerotic heart disease of native coronary artery without angina pectoris: Secondary | ICD-10-CM

## 2018-01-05 LAB — BASIC METABOLIC PANEL
BUN: 13 mg/dL (ref 6–23)
CO2: 30 mEq/L (ref 19–32)
Calcium: 9.6 mg/dL (ref 8.4–10.5)
Chloride: 103 mEq/L (ref 96–112)
Creatinine, Ser: 0.75 mg/dL (ref 0.40–1.20)
GFR: 80.54 mL/min (ref >60.00)
Glucose, Bld: 85 mg/dL (ref 70–99)
Potassium: 4.5 mEq/L (ref 3.5–5.1)
Sodium: 142 mEq/L (ref 135–145)

## 2018-01-05 LAB — CBC WITH DIFFERENTIAL/PLATELET
Basophils Absolute: 0.1 10*3/uL (ref 0.0–0.1)
Basophils Relative: 0.8 % (ref 0.0–3.0)
Eosinophils Absolute: 0.2 10*3/uL (ref 0.0–0.7)
Eosinophils Relative: 2.7 % (ref 0.0–5.0)
HCT: 44.6 % (ref 36.0–46.0)
Hemoglobin: 14.7 g/dL (ref 12.0–15.0)
Lymphocytes Relative: 23.9 % (ref 12.0–46.0)
Lymphs Abs: 1.7 10*3/uL (ref 0.7–4.0)
MCHC: 33 g/dL (ref 30.0–36.0)
MCV: 79.8 fl (ref 78.0–100.0)
Monocytes Absolute: 0.6 10*3/uL (ref 0.1–1.0)
Monocytes Relative: 7.8 % (ref 3.0–12.0)
Neutro Abs: 4.6 10*3/uL (ref 1.4–7.7)
Neutrophils Relative %: 64.8 % (ref 43.0–77.0)
Platelets: 198 10*3/uL (ref 150.0–400.0)
RBC: 5.59 Mil/uL — ABNORMAL HIGH (ref 3.87–5.11)
RDW: 14.7 % (ref 11.5–15.5)
WBC: 7.2 10*3/uL (ref 4.0–10.5)

## 2018-01-05 LAB — POC URINALSYSI DIPSTICK (AUTOMATED)
Bilirubin, UA: NEGATIVE
Blood, UA: POSITIVE
Glucose, UA: NEGATIVE
Ketones, UA: NEGATIVE
Leukocytes, UA: NEGATIVE
Nitrite, UA: NEGATIVE
Protein, UA: NEGATIVE
Spec Grav, UA: >=1.03 — AB (ref 1.010–1.025)
Urobilinogen, UA: 0.2 E.U./dL
pH, UA: 6 (ref 5.0–8.0)

## 2018-01-05 LAB — HEPATIC FUNCTION PANEL
ALT: 23 U/L (ref 0–35)
AST: 22 U/L (ref 0–37)
Albumin: 4.3 g/dL (ref 3.5–5.2)
Alkaline Phosphatase: 135 U/L — ABNORMAL HIGH (ref 39–117)
Bilirubin, Direct: 0.2 mg/dL (ref 0.0–0.3)
Total Bilirubin: 1 mg/dL (ref 0.2–1.2)
Total Protein: 6.7 g/dL (ref 6.0–8.3)

## 2018-01-05 LAB — LIPID PANEL
Cholesterol: 118 mg/dL (ref 0–200)
HDL: 62.6 mg/dL (ref >39.00)
LDL Cholesterol: 41 mg/dL (ref 0–99)
NonHDL: 55.05
Total CHOL/HDL Ratio: 2
Triglycerides: 69 mg/dL (ref 0.0–149.0)
VLDL: 13.8 mg/dL (ref 0.0–40.0)

## 2018-01-05 LAB — TSH: TSH: 2.45 u[IU]/mL (ref 0.35–4.50)

## 2018-01-05 NOTE — Addendum Note (Signed)
Addended by: Alysia Penna A on: 01/05/2018 09:32 AM   Modules accepted: Orders

## 2018-01-05 NOTE — Addendum Note (Signed)
Addended by: Elie Confer on: 01/05/2018 09:29 AM   Modules accepted: Orders

## 2018-01-05 NOTE — Progress Notes (Signed)
   Subjective:    Patient ID: Anne Martin, female    DOB: 1945/11/17, 72 y.o.   MRN: [id number]  HPI Here to follow up on issues. She feels well. She is active and exercises. She had an ECHO in August which showed no change from her previous one. Her EF is 60-65%, and she has mild AS with moderate AI. She saw Dr. Stanford Breed as well , and he gave her a good report. Her BP has been stable.    Review of Systems  Constitutional: Negative.   HENT: Negative.   Eyes: Negative.   Respiratory: Negative.   Cardiovascular: Negative.   Gastrointestinal: Negative.   Genitourinary: Negative for decreased urine volume, difficulty urinating, dyspareunia, dysuria, enuresis, flank pain, frequency, hematuria, pelvic pain and urgency.  Musculoskeletal: Negative.   Skin: Negative.   Neurological: Negative.   Psychiatric/Behavioral: Negative.        Objective:   Physical Exam  Constitutional: She is oriented to person, place, and time. She appears well-developed and well-nourished. No distress.  HENT:  Head: Normocephalic and atraumatic.  Right Ear: External ear normal.  Left Ear: External ear normal.  Nose: Nose normal.  Mouth/Throat: Oropharynx is clear and moist. No oropharyngeal exudate.  Eyes: Pupils are equal, round, and reactive to light. Conjunctivae and EOM are normal. No scleral icterus.  Neck: Normal range of motion. Neck supple. No JVD present. No thyromegaly present.  Cardiovascular: Normal rate, regular rhythm, normal heart sounds and intact distal pulses. Exam reveals no gallop and no friction rub.  No murmur heard. Pulmonary/Chest: Effort normal and breath sounds normal. No respiratory distress. She has no wheezes. She has no rales. She exhibits no tenderness.  Abdominal: Soft. Bowel sounds are normal. She exhibits no distension and no mass. There is no tenderness. There is no rebound and no guarding.  Musculoskeletal: Normal range of motion. She exhibits no edema or tenderness.    Lymphadenopathy:    She has no cervical adenopathy.  Neurological: She is alert and oriented to person, place, and time. She has normal reflexes. She displays normal reflexes. No cranial nerve deficit. She exhibits normal muscle tone. Coordination normal.  Skin: Skin is warm and dry. No rash noted. No erythema.  Psychiatric: She has a normal mood and affect. Her behavior is normal. Judgment and thought content normal.          Assessment & Plan:  She is doing well. Her aortic valve disease is stable. Her HTN is stable. We will get fasting labs to check lipids, etc. Given a pneumococcal vaccine and a flu shot. Alysia Penna, MD

## 2018-01-09 DIAGNOSIS — H35363 Drusen (degenerative) of macula, bilateral: Secondary | ICD-10-CM | POA: Diagnosis not present

## 2018-01-09 DIAGNOSIS — H2513 Age-related nuclear cataract, bilateral: Secondary | ICD-10-CM | POA: Diagnosis not present

## 2018-01-09 DIAGNOSIS — H25013 Cortical age-related cataract, bilateral: Secondary | ICD-10-CM | POA: Diagnosis not present

## 2018-01-12 DIAGNOSIS — L720 Epidermal cyst: Secondary | ICD-10-CM | POA: Diagnosis not present

## 2018-01-12 DIAGNOSIS — D485 Neoplasm of uncertain behavior of skin: Secondary | ICD-10-CM | POA: Diagnosis not present

## 2018-02-21 ENCOUNTER — Telehealth: Payer: Self-pay | Admitting: Cardiology

## 2018-02-21 MED ORDER — ATORVASTATIN CALCIUM 80 MG PO TABS: 80.0000 mg | ORAL_TABLET | Freq: Every day | ORAL | 3 refills | Status: DC

## 2018-02-21 NOTE — Telephone Encounter (Signed)
New Message:       *STAT* If patient is at the pharmacy, call can be transferred to refill team.   1. Which medications need to be refilled? (please list name of each medication and dose if known) atorvastatin (LIPITOR) 80 MG tablet  2. Which pharmacy/location (including street and city if local pharmacy) is medication to be sent to?[id number] - [place], [place] - [address]  3. Do they need a 30 day or 90 day supply? 30   Pt states she left her refill at home and she is in [place] without her medication.

## 2018-02-22 ENCOUNTER — Telehealth: Payer: Self-pay | Admitting: Cardiology

## 2018-02-22 MED ORDER — ATORVASTATIN CALCIUM 80 MG PO TABS: 80.0000 mg | ORAL_TABLET | Freq: Every day | ORAL | 3 refills | Status: DC

## 2018-02-22 MED ORDER — ISOSORBIDE MONONITRATE ER 30 MG PO TB24: 30.0000 mg | ORAL_TABLET | Freq: Every day | ORAL | 3 refills | Status: DC

## 2018-02-22 MED ORDER — METOPROLOL SUCCINATE ER 25 MG PO TB24: 25.0000 mg | ORAL_TABLET | Freq: Every day | ORAL | 3 refills | Status: DC

## 2018-02-22 NOTE — Telephone Encounter (Signed)
Returned call to patient she stated she needs metoprolol,atorvastatin,is[facility]orbide [facility] [place].Refills sent.

## 2018-02-22 NOTE — Telephone Encounter (Signed)
New message:       Pt would like a call back pertaining to all of her medication.

## 2018-03-21 ENCOUNTER — Encounter: Payer: Self-pay | Admitting: Family Medicine

## 2018-03-21 DIAGNOSIS — Z1231 Encounter for screening mammogram for malignant neoplasm of breast: Secondary | ICD-10-CM | POA: Diagnosis not present

## 2018-03-21 DIAGNOSIS — Z01419 Encounter for gynecological examination (general) (routine) without abnormal findings: Secondary | ICD-10-CM | POA: Diagnosis not present

## 2018-03-21 DIAGNOSIS — Z124 Encounter for screening for malignant neoplasm of cervix: Secondary | ICD-10-CM | POA: Diagnosis not present

## 2018-03-23 ENCOUNTER — Telehealth: Payer: Self-pay | Admitting: Cardiology

## 2018-03-23 ENCOUNTER — Other Ambulatory Visit: Payer: Self-pay | Admitting: Obstetrics

## 2018-03-23 DIAGNOSIS — E2839 Other primary ovarian failure: Secondary | ICD-10-CM

## 2018-03-23 NOTE — Telephone Encounter (Signed)
New message    Pt is calling asking for a call back.    Pt c/o of Chest Pain: STAT if CP now or developed within 24 hours  1. Are you having CP right now? No   2. Are you experiencing any other symptoms (ex. SOB, nausea, vomiting, sweating)? no  3. How long have you been experiencing CP? A couple days  4. Is your CP continuous or coming and going? Coming and going  5. Have you taken Nitroglycerin? no ?   Pt said she is having a pain and she isnt sure if it's muscular or heart related and would like to speak to the nurse.

## 2018-03-23 NOTE — Telephone Encounter (Signed)
Spoke with pt, for the last couple weeks she has noticed a tight feeling in her chest. It can be brought on by walking uphill but will last somtime after stopping. She also will get SOB walking uphill but states that is her normal. She can not reproduce the discomfort with movement or taking a deep breath. She reports it feels like her bra is too tight. She has had similar symptoms and went to the ER and they told her it was muscle pain. Follow up appointment made for first of next week with the PA. Patient voiced understanding to go to the ER if symptoms change.

## 2018-03-27 ENCOUNTER — Encounter: Payer: Self-pay | Admitting: Cardiology

## 2018-03-27 ENCOUNTER — Ambulatory Visit (INDEPENDENT_AMBULATORY_CARE_PROVIDER_SITE_OTHER): Payer: Medicare Other | Admitting: Physician Assistant

## 2018-03-27 ENCOUNTER — Encounter: Payer: Self-pay | Admitting: Physician Assistant

## 2018-03-27 VITALS — BP 112/62 | HR 53 | Ht 65.0 in | Wt 180.8 lb

## 2018-03-27 DIAGNOSIS — I2510 Atherosclerotic heart disease of native coronary artery without angina pectoris: Secondary | ICD-10-CM | POA: Diagnosis not present

## 2018-03-27 DIAGNOSIS — R079 Chest pain, unspecified: Secondary | ICD-10-CM | POA: Diagnosis not present

## 2018-03-27 NOTE — Progress Notes (Addendum)
Cardiology Office Note   Date:  03/27/2018   ID:  LISA-MARIE RUEGER, DOB 06-10-1945, MRN [id number]  PCP:  Laurey Morale, MD Cardiologist:  Kirk Ruths, MD 11/21/2017 Rosaria Ferries, PA-C   No chief complaint on file.   History of Present Illness: Anne Martin is a 72 y.o. female with a history of DES Diag 2011, MV 10/2016 w/ nl perfusion and EF 77%, EF nl w/ mild AS and mod AI, mean gradient 10, HTN, HLD, rheumatic fever as a child, osteopenia.  12/12 phone note regarding chest pain, appointment made  Anne Martin presents for cardiology follow up.  She has had a couple of ER visits for chest pain, felt MS origin. 10/2016 & 02/2016. Last MV was 08/2015, no infarct or ischemia, EF 70%.  However, she started again having episodes of band-like pain around her chest consistently with exertion. 5/10. Relieved eventually by rest, but sometimes lasts all day. However, resolves by bedtime, never wakes her.   Has not tried SL NTG or any other meds. The episodes have been shortening in duration. Today has not had any at all.   She has not had new DOE, no presyncope or dizziness. No LE edema, no orthopnea or PND. No change in exercise capacity in general.   She is somewhat anxious about this.  Partly because of the possibility of significant coronary artery disease and partly because she is supposed to go to [place] for the winter next week.  She is concerned that she will have symptoms while she is down there and end up in the hospital in [place].   Past Medical History:  Diagnosis Date  . Allergy    seasonal  . Anemia    prior to hysterectomy, fibroids  . Aortic insufficiency    a. 10/2015 Echo: mild to moderate AI.  Marland Kitchen Aortic stenosis    a. 10/2015 Echo: moderate AS.  Marland Kitchen Blood transfusion without reported diagnosis    with hysterectomy  . CAD (coronary artery disease)    a. 08/2009 s/p cath after abnormal nuclear study; LM nl, LAD 60%, D2 90% (PCI/DES), LCX 50 -60%, RCA nl, nl EF;   b. 2012 MV: no ischemia/infarct;  c. 08/2015 MV: no ischemia/infarct, EF 70%.  . Diastolic dysfunction    a. 10/2015 Echo: EF 55-60%, no rwma, Gr1 DD, mod AS, mild to mod AI.  Marland Kitchen Essential hypertension   . H/O: rheumatic fever   . History of colonoscopy   . Hyperlipidemia   . Osteopenia    last DEXA 11-22-13     Past Surgical History:  Procedure Laterality Date  . ANGIOPLASTY  may 2011   stents  . CHOLECYSTECTOMY  nov 2004  . COLONOSCOPY  02/02/2016   per Dr. Henrene Pastor, adenomatous polyps, repeat in 5 yrs   . KNEE ARTHROSCOPY     Rt. Knee  . TONSILLECTOMY    . VAGINAL HYSTERECTOMY  1994    Current Outpatient Medications  Medication Sig Dispense Refill  . aspirin 81 MG tablet Take 81 mg by mouth daily.      Marland Kitchen atorvastatin (LIPITOR) 80 MG tablet Take 1 tablet (80 mg total) by mouth daily at 6 PM. 90 tablet 3  . Calcium Carbonate (CALCIUM 600) 1500 MG TABS Take 600 mg of elemental calcium by mouth daily.     . cholecalciferol (VITAMIN D) 1000 UNITS tablet Take 1,000 Units by mouth daily.    . fluticasone (FLONASE) 50 MCG/ACT nasal spray Place 2 sprays  into both nostrils daily as needed for allergies. AS NEEDED    . isosorbide mononitrate (IMDUR) 30 MG 24 hr tablet Take 1 tablet (30 mg total) by mouth daily. 90 tablet 3  . Loratadine (CLARITIN) 10 MG CAPS Take 10 mg by mouth daily. 24 hr tabs    . metoprolol succinate (TOPROL-XL) 25 MG 24 hr tablet Take 1 tablet (25 mg total) by mouth daily. 90 tablet 3  . nitroGLYCERIN (NITROSTAT) 0.4 MG SL tablet Place 1 tablet (0.4 mg total) under the tongue every 5 (five) minutes as needed. MAX 3 doses 25 tablet 3  . Probiotic Product (PROBIOTIC PO) Take 1 tablet by mouth every morning.    Merril Abbe 10 MCG TABS vaginal tablet INSERT 1 TABLET TWICE WEEKLY  1   No current facility-administered medications for this visit.     Allergies:   Procaine hcl    Social History:  The patient  reports that she quit smoking about 40 years ago. Her smoking use  included cigarettes. She has never used smokeless tobacco. She reports current alcohol use. She reports that she does not use drugs.   Family History:  The patient's family history includes Heart disease in her brother, father, and mother; Ulcerative colitis in her daughter.  She indicated that her mother is deceased. She indicated that her father is deceased. She indicated that both of her sisters are alive. She indicated that her brother is alive. She indicated that the status of her daughter is unknown. She indicated that the status of her neg hx is unknown.   ROS:  Please see the history of present illness. All other systems are reviewed and negative.    PHYSICAL EXAM: VS:  BP 112/62   Pulse (!) 53   Ht 5\' 5"  (1.651 m)   Wt 180 lb 12.8 oz (82 kg)   LMP  (LMP Unknown)   BMI 30.09 kg/m  , BMI Body mass index is 30.09 kg/m. GEN: Well nourished, well developed, female in no acute distress HEENT: normal for age  Neck: no JVD, L>R carotid bruits (suspect radiation of AS murmur), no masses Cardiac: RRR; 2/6 murmur, no rubs, or gallops Respiratory:  clear to auscultation bilaterally, normal work of breathing GI: soft, nontender, nondistended, + BS MS: no deformity or atrophy; no edema; distal pulses are 2+ in all 4 extremities  Skin: warm and dry, no rash Neuro:  Strength and sensation are intact Psych: euthymic mood, full affect   EKG:  EKG is ordered today. The ekg ordered today demonstrates sinus bradycardia, heart rate 53, no acute ischemic changes, no pathologic Q waves, normal intervals  ECHO: 11/17/2017 - Left ventricle: The cavity size was normal. Systolic function was   normal. The estimated ejection fraction was in the range of 60%   to 65%. Wall motion was normal; there were no regional wall   motion abnormalities. Doppler parameters are consistent with   abnormal left ventricular relaxation (grade 1 diastolic   dysfunction). Doppler parameters are consistent with high    ventricular filling pressure. - Aortic valve: There was mild stenosis. There was moderate   regurgitation. Peak velocity (S): 224 cm/s. Mean gradient (S): 10   mm Hg. Valve area (VTI): 1.63 cm^2. Valve area (Vmax): 1.72 cm^2.   Valve area (Vmean): 1.6 cm^2. - Mitral valve: Transvalvular velocity was within the normal range.   There was no evidence for stenosis. There was trivial   regurgitation. - Right ventricle: The cavity size was normal. Wall  thickness was   normal. Systolic function was normal. - Atrial septum: No defect or patent foramen ovale was identified. - Tricuspid valve: There was trivial regurgitation. - Pulmonary arteries: Systolic pressure was within the normal   range. PA peak pressure: 29 mm Hg (S).  CATH: 08/2009  LM nl, LAD 60%, D2 90% (PCI/DES), LCX 50 -60%, RCA nl, nl EF   Recent Labs: 01/05/2018: ALT 23; BUN 13; Creatinine, Ser 0.75; Hemoglobin 14.7; Platelets 198.0; Potassium 4.5; Sodium 142; TSH 2.45  CBC    Component Value Date/Time   WBC 7.2 01/05/2018 0932   RBC 5.59 (H) 01/05/2018 0932   HGB 14.7 01/05/2018 0932   HCT 44.6 01/05/2018 0932   PLT 198.0 01/05/2018 0932   MCV 79.8 01/05/2018 0932   MCH 26.0 10/27/2016 2318   MCHC 33.0 01/05/2018 0932   RDW 14.7 01/05/2018 0932   LYMPHSABS 1.7 01/05/2018 0932   MONOABS 0.6 01/05/2018 0932   EOSABS 0.2 01/05/2018 0932   BASOSABS 0.1 01/05/2018 0932   CMP Latest Ref Rng & Units 01/05/2018 11/16/2016 11/12/2016  Glucose 70 - 99 mg/dL 85 88 -  BUN 6 - 23 mg/dL 13 14 -  Creatinine 0.40 - 1.20 mg/dL 0.75 0.78 -  Sodium 135 - 145 mEq/L 142 141 -  Potassium 3.5 - 5.1 mEq/L 4.5 4.1 -  Chloride 96 - 112 mEq/L 103 104 -  CO2 19 - 32 mEq/L 30 29 -  Calcium 8.4 - 10.5 mg/dL 9.6 9.6 -  Total Protein 6.0 - 8.3 g/dL 6.7 - 6.5  Total Bilirubin 0.2 - 1.2 mg/dL 1.0 - 0.5  Alkaline Phos 39 - 117 U/L 135(H) - 152(H)  AST 0 - 37 U/L 22 - 29  ALT 0 - 35 U/L 23 - 24     Lipid Panel Lab Results  Component Value Date     CHOL 118 01/05/2018   HDL 62.60 01/05/2018   LDLCALC 41 01/05/2018   TRIG 69.0 01/05/2018   CHOLHDL 2 01/05/2018      Wt Readings from Last 3 Encounters:  03/27/18 180 lb 12.8 oz (82 kg)  01/05/18 180 lb 6 oz (81.8 kg)  11/21/17 182 lb (82.6 kg)     Other studies Reviewed: Additional studies/ records that were reviewed today include: Office notes, hospital records and testing.  ASSESSMENT AND PLAN:  1.  Chest pain: I reviewed the patient with Dr. Stanford Breed who agrees with the plan. - Her symptoms are exertional, but she is also had symptoms that lasted all day long.  Her ECG is not acute. - She had similar symptoms in 2017 when a stress test was normal. -Dr. Stanford Breed agrees that we need to evaluate her further prior to her going to [place]. - Her symptoms are not high risk, therefore an exercise Myoview was appropriate.  If she does not hit her target heart rate, they can always change to a Lexiscan. - However, it would be good to get an idea of her exercise capacity and see if she gets recurrent symptoms or ECG changes with exercise. -She is agreeable to this as a plan. -Blood pressure and cholesterol are well controlled, no changes needed. -As long as her stress test is okay, follow-up with Dr. Stanford Breed after she returns to the area.   Current medicines are reviewed at length with the patient today.  The patient does not have concerns regarding medicines.  The following changes have been made:  no change  Labs/ tests ordered today include:  Orders Placed This Encounter  Procedures  . MYOCARDIAL PERFUSION IMAGING  . EKG 12-Lead     Disposition:   FU with Kirk Ruths, MD  Signed, Rosaria Ferries, PA-C  03/27/2018 1:42 PM    [facility] Phone: [phone]; Fax: [phone]   Addendum: MV results reviewed and communicated w/ Dr Stanford Breed. Pt contacted, husband also on the phone.  Since MV high-risk, cardiac catheterization is  indicated. The risks and benefits of a cardiac catheterization including, but not limited to, death, stroke, MI, kidney damage and bleeding were discussed with the patient and her husband who indicate understanding and agree to proceed.   Cath scheduled for Thursday. Pt advised to take all am meds. No food/drink after midnight before.  Come to the hospital at 9:30 for a 12:00 noon procedure.  Draw labs day of the procedure.   She was told to take SL NTG prn for pain, if she takes 3 without relief, call 911 and come to the ER.  Pt and husband were agreeable to the plan.   Rosaria Ferries, PA-C 03/28/2018 5:03 PM Beeper [phone]

## 2018-03-27 NOTE — Patient Instructions (Signed)
Medication Instructions:  The current medical regimen is effective;  continue present plan and medications.  If you need a refill on your cardiac medications before your next appointment, please call your pharmacy.   Lab work: None If you have labs (blood work) drawn today and your tests are completely normal, you will receive your results only by: Marland Kitchen MyChart Message (if you have MyChart) OR . A paper copy in the mail If you have any lab test that is abnormal or we need to change your treatment, we will call you to review the results.  Testing/Procedures: Your physician has requested that you have an Exercise Myoview. A cardiac stress test is a cardiological test that measures the heart's ability to respond to external stress in a controlled clinical environment. The stress response is induced by exercise (exercise-treadmill). For further information please visit [email or web address][email or web address]. If you have questions or concerns about your appointment, you can call the Nuclear Lab at [phone].  Hold: Metoprolol the day of the testing     Follow-Up: At [facility], you and your health needs are our priority.  As part of our continuing mission to provide you with exceptional heart care, we have created designated Provider Care Teams.  These Care Teams include your primary Cardiologist (physician) and Advanced Practice Providers (APPs -  Physician Assistants and Nurse Practitioners) who all work together to provide you with the care you need, when you need it. You will need a follow up appointment in 5 months.  Please call our office 2 months in advance to schedule this appointment.  You may see Kirk Ruths, MD or one of the following Advanced Practice Providers on your designated Care Team:   Kerin Ransom, PA-C Roby Lofts, [place] . Sande Rives, PA-C  Any Other Special Instructions Will Be Listed Below (If Applicable). None

## 2018-03-28 ENCOUNTER — Other Ambulatory Visit: Payer: Self-pay

## 2018-03-28 ENCOUNTER — Observation Stay (HOSPITAL_COMMUNITY)
Admission: EM | Admit: 2018-03-28 | Discharge: 2018-03-29 | Disposition: A | Discharge status: Home / Self Care | Payer: Medicare Other | Attending: Cardiology | Admitting: Cardiology

## 2018-03-28 ENCOUNTER — Other Ambulatory Visit: Payer: Self-pay | Admitting: Physician Assistant

## 2018-03-28 ENCOUNTER — Encounter (HOSPITAL_COMMUNITY): Payer: Self-pay | Admitting: Emergency Medicine

## 2018-03-28 ENCOUNTER — Encounter: Payer: Self-pay | Admitting: Physician Assistant

## 2018-03-28 ENCOUNTER — Ambulatory Visit (HOSPITAL_BASED_OUTPATIENT_CLINIC_OR_DEPARTMENT_OTHER)
Admission: RE | Admit: 2018-03-28 | Discharge: 2018-03-28 | Disposition: A | Discharge status: Home / Self Care | Payer: Medicare Other | Source: Ambulatory Visit | Attending: Cardiology | Admitting: Cardiology

## 2018-03-28 ENCOUNTER — Emergency Department (HOSPITAL_COMMUNITY): Payer: Medicare Other

## 2018-03-28 DIAGNOSIS — I200 Unstable angina: Secondary | ICD-10-CM

## 2018-03-28 DIAGNOSIS — R0902 Hypoxemia: Secondary | ICD-10-CM | POA: Diagnosis not present

## 2018-03-28 DIAGNOSIS — Z87891 Personal history of nicotine dependence: Secondary | ICD-10-CM | POA: Insufficient documentation

## 2018-03-28 DIAGNOSIS — R0789 Other chest pain: Secondary | ICD-10-CM | POA: Diagnosis not present

## 2018-03-28 DIAGNOSIS — M8580 Other specified disorders of bone density and structure, unspecified site: Secondary | ICD-10-CM | POA: Insufficient documentation

## 2018-03-28 DIAGNOSIS — I25110 Atherosclerotic heart disease of native coronary artery with unstable angina pectoris: Secondary | ICD-10-CM | POA: Diagnosis not present

## 2018-03-28 DIAGNOSIS — I352 Nonrheumatic aortic (valve) stenosis with insufficiency: Secondary | ICD-10-CM | POA: Diagnosis not present

## 2018-03-28 DIAGNOSIS — Y831 Surgical operation with implant of artificial internal device as the cause of abnormal reaction of the patient, or of later complication, without mention of misadventure at the time of the procedure: Secondary | ICD-10-CM | POA: Insufficient documentation

## 2018-03-28 DIAGNOSIS — Z7902 Long term (current) use of antithrombotics/antiplatelets: Secondary | ICD-10-CM | POA: Diagnosis not present

## 2018-03-28 DIAGNOSIS — R079 Chest pain, unspecified: Secondary | ICD-10-CM | POA: Diagnosis not present

## 2018-03-28 DIAGNOSIS — I10 Essential (primary) hypertension: Secondary | ICD-10-CM | POA: Diagnosis not present

## 2018-03-28 DIAGNOSIS — E785 Hyperlipidemia, unspecified: Secondary | ICD-10-CM | POA: Diagnosis not present

## 2018-03-28 DIAGNOSIS — I350 Nonrheumatic aortic (valve) stenosis: Secondary | ICD-10-CM

## 2018-03-28 DIAGNOSIS — Z7982 Long term (current) use of aspirin: Secondary | ICD-10-CM | POA: Diagnosis not present

## 2018-03-28 DIAGNOSIS — T82855A Stenosis of coronary artery stent, initial encounter: Secondary | ICD-10-CM | POA: Insufficient documentation

## 2018-03-28 DIAGNOSIS — Z8249 Family history of ischemic heart disease and other diseases of the circulatory system: Secondary | ICD-10-CM | POA: Diagnosis not present

## 2018-03-28 DIAGNOSIS — I2510 Atherosclerotic heart disease of native coronary artery without angina pectoris: Secondary | ICD-10-CM

## 2018-03-28 DIAGNOSIS — Z7951 Long term (current) use of inhaled steroids: Secondary | ICD-10-CM | POA: Insufficient documentation

## 2018-03-28 LAB — MYOCARDIAL PERFUSION IMAGING
Estimated workload: 7 METS
Exercise duration (min): 5 min
Exercise duration (sec): 36 s
LV dias vol: 75 mL (ref 46–106)
LV sys vol: 22 mL
MPHR: 147 {beats}/min
Peak HR: 141 {beats}/min
Percent HR: 95 %
RPE: 19
Rest HR: 58 {beats}/min
SDS: 4
SRS: 0
SSS: 4
TID: 1.67

## 2018-03-28 LAB — BASIC METABOLIC PANEL
Anion gap: 9 (ref 5–15)
BUN: 12 mg/dL (ref 8–23)
CO2: 26 mmol/L (ref 22–32)
Calcium: 9.5 mg/dL (ref 8.9–10.3)
Chloride: 106 mmol/L (ref 98–111)
Creatinine, Ser: 0.93 mg/dL (ref 0.44–1.00)
GFR calc Af Amer: >60 mL/min (ref >60)
GFR calc non Af Amer: >60 mL/min (ref >60)
Glucose, Bld: 109 mg/dL — ABNORMAL HIGH (ref 70–99)
Potassium: 3.9 mmol/L (ref 3.5–5.1)
Sodium: 141 mmol/L (ref 135–145)

## 2018-03-28 LAB — CBC
HCT: 44.9 % (ref 36.0–46.0)
Hemoglobin: 14.4 g/dL (ref 12.0–15.0)
MCH: 26.1 pg (ref 26.0–34.0)
MCHC: 32.1 g/dL (ref 30.0–36.0)
MCV: 81.3 fL (ref 80.0–100.0)
Platelets: 215 10*3/uL (ref 150–400)
RBC: 5.52 MIL/uL — ABNORMAL HIGH (ref 3.87–5.11)
RDW: 14.6 % (ref 11.5–15.5)
WBC: 7.2 10*3/uL (ref 4.0–10.5)
nRBC: 0 % (ref 0.0–0.2)

## 2018-03-28 LAB — I-STAT TROPONIN, ED: Troponin i, poc: 0 ng/mL (ref 0.00–0.08)

## 2018-03-28 MED ORDER — TECHNETIUM TC 99M TETROFOSMIN IV KIT
26.1000 | PACK | Freq: Once | INTRAVENOUS | Status: AC | PRN
  Administered 2018-03-28: 26.1 via INTRAVENOUS
  Filled 2018-03-28: qty 27

## 2018-03-28 MED ORDER — HEPARIN (PORCINE) 25000 UT/250ML-% IV SOLN
1000.0000 [IU]/h | INTRAVENOUS | Status: DC
  Administered 2018-03-29: 1000 [IU]/h via INTRAVENOUS
  Filled 2018-03-28: qty 250

## 2018-03-28 MED ORDER — TECHNETIUM TC 99M TETROFOSMIN IV KIT
28.1000 | PACK | Freq: Once | INTRAVENOUS | Status: DC | PRN
  Filled 2018-03-28: qty 29

## 2018-03-28 MED ORDER — NITROGLYCERIN 0.4 MG SL SUBL: 0.4000 mg | SUBLINGUAL_TABLET | SUBLINGUAL | 3 refills | Status: DC | PRN

## 2018-03-28 MED ORDER — HEPARIN BOLUS VIA INFUSION
3000.0000 [IU] | Freq: Once | INTRAVENOUS | Status: AC
  Administered 2018-03-29: 3000 [IU] via INTRAVENOUS
  Filled 2018-03-28: qty 3000

## 2018-03-28 MED ORDER — ATORVASTATIN CALCIUM 80 MG PO TABS
80.0000 mg | ORAL_TABLET | Freq: Every day | ORAL | Status: DC
  Administered 2018-03-29: 80 mg via ORAL
  Filled 2018-03-28: qty 1

## 2018-03-28 MED ORDER — METOPROLOL SUCCINATE ER 25 MG PO TB24
25.0000 mg | ORAL_TABLET | Freq: Every day | ORAL | Status: DC
  Administered 2018-03-29: 25 mg via ORAL
  Filled 2018-03-28: qty 1

## 2018-03-28 MED ORDER — TECHNETIUM TC 99M TETROFOSMIN IV KIT
8.2000 | PACK | Freq: Once | INTRAVENOUS | Status: AC | PRN
  Administered 2018-03-28: 8.2 via INTRAVENOUS
  Filled 2018-03-28: qty 9

## 2018-03-28 NOTE — ED Triage Notes (Signed)
Pt reports she has been having chest issues for a while, went for a stress test today.  There is a confirmed blockage and have scheduled her a cath for Thursday.  Since then she has has the feeling of a "fist in the center of my chest."  Pt has had a total to 2 nitro and 324 ASA, moving from a 8-10 to a 0-10.

## 2018-03-28 NOTE — Progress Notes (Signed)
ANTICOAGULATION CONSULT NOTE - Initial Consult  Pharmacy Consult for heparin Indication: USAP  Allergies  Allergen Reactions  . Procaine Hcl Other (See Comments)    Unknown    Patient Measurements: Height: 5\' 5"  (165.1 cm) Weight: 178 lb (80.7 kg) IBW/kg (Calculated) : 57 Heparin Dosing Weight: 75kg  Vital Signs: Temp: 98.2 F (36.8 C) (12/17 2233) Temp Source: Oral (12/17 2233) BP: 115/74 (12/17 2315) Pulse Rate: 59 (12/17 2315)  Labs: Recent Labs    03/28/18 2235  HGB 14.4  HCT 44.9  PLT 215  CREATININE 0.93    Estimated Creatinine Clearance: 56.6 mL/min (by C-G formula based on SCr of 0.93 mg/dL).   Medical History: Past Medical History:  Diagnosis Date  . Allergy    seasonal  . Anemia    prior to hysterectomy, fibroids  . Aortic insufficiency    a. 10/2015 Echo: mild to moderate AI.  Marland Kitchen Aortic stenosis    a. 10/2015 Echo: moderate AS.  Marland Kitchen Blood transfusion without reported diagnosis    with hysterectomy  . CAD (coronary artery disease)    a. 08/2009 s/p cath after abnormal nuclear study; LM nl, LAD 60%, D2 90% (PCI/DES), LCX 50 -60%, RCA nl, nl EF;  b. 2012 MV: no ischemia/infarct;  c. 08/2015 MV: no ischemia/infarct, EF 70%.  . Diastolic dysfunction    a. 10/2015 Echo: EF 55-60%, no rwma, Gr1 DD, mod AS, mild to mod AI.  Marland Kitchen Essential hypertension   . H/O: rheumatic fever   . History of colonoscopy   . Hyperlipidemia   . Osteopenia    last DEXA 11-22-13     Assessment: 72yo female had abnormal stress test today, awaiting cath on Thursday, now c/o continued CP that improved w/ NTG x2, troponin currently negative, to begin heparin.  Goal of Therapy:  Heparin level 0.3-0.7 units/ml Monitor platelets by anticoagulation protocol: Yes   Plan:  Will give heparin 3000 units IV bolus x1 followed by gtt at 1000 units/hr and monitor heparin levels and CBC.  Wynona Neat, PharmD, BCPS  03/28/2018,11:33 PM

## 2018-03-28 NOTE — ED Provider Notes (Signed)
[facility] EMERGENCY DEPARTMENT Provider Note   CSN: [id number] Arrival date & time: 03/28/18  2225     History   Chief Complaint Chief Complaint  Patient presents with  . Chest Pain    HPI Anne Martin is a 72 y.o. female.  HPI  This is a 72 year old female with a history of coronary artery disease, hypertension, aortic stenosis who presents with chest pain.  Patient reports that over the last 10 days she has had recurrent bandlike chest pain over her anterior chest.  She was seen by cardiology and had a stress test earlier today.  Stress test was positive for ischemia.  She is scheduled for cardiac cath on December 19.  Patient reports that since the stress test she has "felt like there is a fist in my chest."  She states that the pain was constant at home.  She denies any associated nausea or shortness of breath.  She got worried because of the consistency of the pain and called EMS.  She was given nitroglycerin and aspirin which completely resolved her symptoms.  She is currently pain-free.  Past Medical History:  Diagnosis Date  . Allergy    seasonal  . Anemia    prior to hysterectomy, fibroids  . Aortic insufficiency    a. 10/2015 Echo: mild to moderate AI.  Marland Kitchen Aortic stenosis    a. 10/2015 Echo: moderate AS.  Marland Kitchen Blood transfusion without reported diagnosis    with hysterectomy  . CAD (coronary artery disease)    a. 08/2009 s/p cath after abnormal nuclear study; LM nl, LAD 60%, D2 90% (PCI/DES), LCX 50 -60%, RCA nl, nl EF;  b. 2012 MV: no ischemia/infarct;  c. 08/2015 MV: no ischemia/infarct, EF 70%.  . Diastolic dysfunction    a. 10/2015 Echo: EF 55-60%, no rwma, Gr1 DD, mod AS, mild to mod AI.  Marland Kitchen Essential hypertension   . H/O: rheumatic fever   . History of colonoscopy   . Hyperlipidemia   . Osteopenia    last DEXA 11-22-13     Patient Active Problem List   Diagnosis Date Noted  . Chest pain 10/28/2016  . Aortic insufficiency 02/29/2016  .  Essential hypertension 02/29/2016  . Musculoskeletal chest pain 02/28/2016  . Aortic stenosis 02/24/2012  . Hyperlipidemia 11/07/2009  . CAD, NATIVE VESSEL 09/15/2009  . CAROTID BRUIT 09/15/2009  . CONTACT DERMATITIS 01/28/2009  . Disorder of bone and cartilage 12/17/2008  . ALLERGIC RHINITIS 08/16/2007  . KNEE PAIN 08/16/2007  . History of cardiovascular disorder 08/16/2007    Past Surgical History:  Procedure Laterality Date  . ANGIOPLASTY  may 2011   stents  . CHOLECYSTECTOMY  nov 2004  . COLONOSCOPY  02/02/2016   per Dr. Henrene Pastor, adenomatous polyps, repeat in 5 yrs   . KNEE ARTHROSCOPY     Rt. Knee  . TONSILLECTOMY    . VAGINAL HYSTERECTOMY  1994     OB History   No obstetric history on file.      Home Medications    Prior to Admission medications   Medication Sig Start Date End Date Taking? Authorizing Provider  aspirin 81 MG tablet Take 81 mg by mouth daily.      [provider]  atorvastatin (LIPITOR) 80 MG tablet Take 1 tablet (80 mg total) by mouth daily at 6 PM. 02/22/18   Crenshaw, Denice Bors, MD  Calcium Carbonate (CALCIUM 600) 1500 MG TABS Take 600 mg of elemental calcium by mouth daily.  [provider]  cholecalciferol (VITAMIN D) 1000 UNITS tablet Take 1,000 Units by mouth daily.    [provider]  fluticasone (FLONASE) 50 MCG/ACT nasal spray Place 2 sprays into both nostrils daily as needed for allergies. AS NEEDED    [provider]  isosorbide mononitrate (IMDUR) 30 MG 24 hr tablet Take 1 tablet (30 mg total) by mouth daily. 02/22/18   Lelon Perla, MD  Loratadine (CLARITIN) 10 MG CAPS Take 10 mg by mouth daily. 24 hr tabs    [provider]  metoprolol succinate (TOPROL-XL) 25 MG 24 hr tablet Take 1 tablet (25 mg total) by mouth daily. 02/22/18   Lelon Perla, MD  nitroGLYCERIN (NITROSTAT) 0.4 MG SL tablet Place 1 tablet (0.4 mg total) under the tongue every 5 (five) minutes as needed. MAX 3 doses  03/28/18   Barrett, Evelene Croon, PA-C  Probiotic Product (PROBIOTIC PO) Take 1 tablet by mouth every morning.    [provider]  YUVAFEM 10 MCG TABS vaginal tablet INSERT 1 TABLET TWICE WEEKLY 10/29/17   [provider]    Family History Family History  Problem Relation Age of Onset  . Heart disease Mother   . Heart disease Father        CABG  . Heart disease Brother        2 stents  . Ulcerative colitis Daughter   . Colon cancer Neg Hx     Social History Social History   Tobacco Use  . Smoking status: Former Smoker    Types: Cigarettes    Last attempt to quit: 04/12/1977    Years since quitting: 40.9  . Smokeless tobacco: Never Used  . Tobacco comment: quit age 40  Substance Use Topics  . Alcohol use: Yes    Alcohol/week: 0.0 standard drinks    Comment: rarely  . Drug use: No     Allergies   Procaine hcl   Review of Systems Review of Systems  Constitutional: Negative for fever.  Respiratory: Negative for shortness of breath.   Cardiovascular: Positive for chest pain. Negative for leg swelling.  Gastrointestinal: Negative for abdominal pain, nausea and vomiting.  Genitourinary: Negative for dysuria.  All other systems reviewed and are negative.    Physical Exam Updated Vital Signs BP 131/82   Pulse 71   Temp 98.2 F (36.8 C) (Oral)   Resp 15   Ht 1.651 m (5\' 5" )   Wt 80.7 kg   LMP  (LMP Unknown)   SpO2 97%   BMI 29.62 kg/m   Physical Exam Vitals signs and nursing note reviewed.  Constitutional:      Appearance: She is well-developed.  HENT:     Head: Normocephalic and atraumatic.  Neck:     Musculoskeletal: Neck supple.  Cardiovascular:     Rate and Rhythm: Normal rate and regular rhythm.     Heart sounds: Normal heart sounds.     Comments: Systolic ejection murmur Pulmonary:     Effort: Pulmonary effort is normal. No respiratory distress.     Breath sounds: Normal breath sounds. No wheezing.  Abdominal:     General: Bowel  sounds are normal.     Palpations: Abdomen is soft.  Musculoskeletal:     Comments: Raised bilateral lower extremity edema  Skin:    General: Skin is warm and dry.  Neurological:     Mental Status: She is alert and oriented to person, place, and time.  Psychiatric:  Mood and Affect: Mood normal.      ED Treatments / Results  Labs (all labs ordered are listed, but only abnormal results are displayed) Labs Reviewed  BASIC METABOLIC PANEL - Abnormal; Notable for the following components:      Result Value   Glucose, Bld 109 (*)    All other components within normal limits  CBC - Abnormal; Notable for the following components:   RBC 5.52 (*)    All other components within normal limits  I-STAT TROPONIN, ED    EKG EKG Interpretation  Date/Time:  Tuesday March 28 2018 22:30:58 EST Ventricular Rate:  63 PR Interval:    QRS Duration: 92 QT Interval:  392 QTC Calculation: 402 R Axis:   79 Text Interpretation:  Sinus rhythm Consider left atrial enlargement No significant change since last tracing Confirmed by Thayer Jew [phone]) on 03/28/2018 11:02:07 PM   Radiology Dg Chest 2 View  Result Date: 03/28/2018 CLINICAL DATA:  Chest pain EXAM: CHEST - 2 VIEW COMPARISON:  10/27/2016 FINDINGS: The heart size and mediastinal contours are within normal limits. Both lungs are clear. The visualized skeletal structures are unremarkable. IMPRESSION: No active cardiopulmonary disease. Electronically Signed   By: Ulyses Jarred M.D.   On: 03/28/2018 23:12    Procedures Procedures (including critical care time)  CRITICAL CARE Performed by: Merryl Hacker   Total critical care time: 35 minutes  Critical care time was exclusive of separately billable procedures and treating other patients.  Critical care was necessary to treat or prevent imminent or life-threatening deterioration.  Critical care was time spent personally by me on the following activities: development  of treatment plan with patient and/or surrogate as well as nursing, discussions with consultants, evaluation of patient's response to treatment, examination of patient, obtaining history from patient or surrogate, ordering and performing treatments and interventions, ordering and review of laboratory studies, ordering and review of radiographic studies, pulse oximetry and re-evaluation of patient's condition.   Medications Ordered in ED Medications - No data to display   Initial Impression / Assessment and Plan / ED Course  I have reviewed the triage vital signs and the nursing notes.  Pertinent labs & imaging results that were available during my care of the patient were reviewed by me and considered in my medical decision making (see chart for details).     Patient presents with chest pain.  Improved after nitroglycerin and aspirin.  She is currently pain-free.  Vital signs are reassuring.  She is afebrile.  She had a positive stress test today and it sounds as if she had unstable angina during the afternoon.  She is very high risk.  Initial EKG shows no acute ischemia and initial troponin is negative.  However, patient was started on a heparin drip for unstable angina.  Cardiology was consulted to admit.   Final Clinical Impressions(s) / ED Diagnoses   Final diagnoses:  Unstable angina [facility])    ED Discharge Orders    None       Merryl Hacker, MD 03/28/18 2326

## 2018-03-28 NOTE — H&P (Signed)
Cardiology Admission History and Physical:   Patient ID: Anne Martin; MRN: [id number]; DOB: July 09, 1945   Admission date: 03/28/2018  Primary Care Provider:  Laurey Morale, MD Primary Cardiologist:  Kirk Ruths, MD    Chief Complaint:  Chest pain  History of Present Illness:   Anne Martin is a 72 y.o. female with a history of coronary artery disease (PCI with DES of D2 in 2011), mild AS, seasonal allergies, hyperlipidemia, and osteopenia who presents to the hospital with chest heaviness. The patient has been experiencing off and on chest pain for the past 10 days. It is more noticeable when she walks uphill. She is an active person and attempts to walk everyday. For more than a week she has had chest tightness both with exertion and at rest. She had a nuclear stress test yesterday and completed the exercise component of the test without any provocation of symptoms. However the perfusion images revealed a reversible defect and she was scheduled for a cardiac cath on 03/30/18. Since the stress test she has felt uncomfortable, as if there is a fist in the center of the chest. She had this for a few hours and then finally decided to call 911. EMS treated her with ASA and sublingual NTG (that resolved the pain completely).  She denies any orthopnea, paroxysmal nocturnal dyspnea or lower extremity edema. She also does not report any diaphoresis, nausea or vomiting.  Past cardiac testing/workup: Echocardiogram - 11/17/17 - Left ventricle: The cavity size was normal. Systolic function was   normal. The estimated ejection fraction was in the range of 60%   to 65%. Wall motion was normal; there were no regional wall   motion abnormalities. Doppler parameters are consistent with   abnormal left ventricular relaxation (grade 1 diastolic   dysfunction). Doppler parameters are consistent with high   ventricular filling pressure. - Aortic valve: There was mild stenosis. There was moderate  regurgitation. Peak velocity (S): 224 cm/s. Mean gradient (S): 10   mm Hg. Valve area (VTI): 1.63 cm^2. Valve area (Vmax): 1.72 cm^2.   Valve area (Vmean): 1.6 cm^2. - Mitral valve: Transvalvular velocity was within the normal range.   There was no evidence for stenosis. There was trivial   regurgitation. - Right ventricle: The cavity size was normal. Wall thickness was   normal. Systolic function was normal. - Atrial septum: No defect or patent foramen ovale was identified. - Tricuspid valve: There was trivial regurgitation. - Pulmonary arteries: Systolic pressure was within the normal   range. PA peak pressure: 29 mm Hg (S).  Cardiac catheterization - 08/2009  LM nl, LAD 60%, D2 90% (PCI/DES), LCX 50 -60%, RCA nl, nl EF   Past Medical History:  Diagnosis Date  . Allergy    seasonal  . Anemia    prior to hysterectomy, fibroids  . Aortic insufficiency    a. 10/2015 Echo: mild to moderate AI.  Marland Kitchen Aortic stenosis    a. 10/2015 Echo: moderate AS.  Marland Kitchen Blood transfusion without reported diagnosis    with hysterectomy  . CAD (coronary artery disease)    a. 08/2009 s/p cath after abnormal nuclear study; LM nl, LAD 60%, D2 90% (PCI/DES), LCX 50 -60%, RCA nl, nl EF;  b. 2012 MV: no ischemia/infarct;  c. 08/2015 MV: no ischemia/infarct, EF 70%.  . Diastolic dysfunction    a. 10/2015 Echo: EF 55-60%, no rwma, Gr1 DD, mod AS, mild to mod AI.  Marland Kitchen Essential hypertension   . H/O: rheumatic  fever   . History of colonoscopy   . Hyperlipidemia   . Osteopenia    last DEXA 11-22-13     Past Surgical History:  Procedure Laterality Date  . ANGIOPLASTY  may 2011   stents  . CHOLECYSTECTOMY  nov 2004  . COLONOSCOPY  02/02/2016   per Dr. Henrene Pastor, adenomatous polyps, repeat in 5 yrs   . KNEE ARTHROSCOPY     Rt. Knee  . TONSILLECTOMY    . VAGINAL HYSTERECTOMY  1994     Medications Prior to Admission: Prior to Admission medications   Medication Sig Start Date End Date Taking? Authorizing Provider    aspirin 81 MG tablet Take 81 mg by mouth daily.     Yes [provider]  atorvastatin (LIPITOR) 80 MG tablet Take 1 tablet (80 mg total) by mouth daily at 6 PM. 02/22/18  Yes Crenshaw, Denice Bors, MD  Calcium Carbonate (CALCIUM 600) 1500 MG TABS Take 600 mg of elemental calcium by mouth daily.    Yes [provider]  cholecalciferol (VITAMIN D) 1000 UNITS tablet Take 1,000 Units by mouth every evening.    Yes [provider]  fluticasone (FLONASE) 50 MCG/ACT nasal spray Place 2 sprays into both nostrils daily as needed for allergies. AS NEEDED   Yes [provider]  isosorbide mononitrate (IMDUR) 30 MG 24 hr tablet Take 1 tablet (30 mg total) by mouth daily. 02/22/18  Yes Lelon Perla, MD  Loratadine (CLARITIN) 10 MG CAPS Take 10 mg by mouth daily. 24 hr tabs   Yes [provider]  metoprolol succinate (TOPROL-XL) 25 MG 24 hr tablet Take 1 tablet (25 mg total) by mouth daily. 02/22/18  Yes Lelon Perla, MD  nitroGLYCERIN (NITROSTAT) 0.4 MG SL tablet Place 1 tablet (0.4 mg total) under the tongue every 5 (five) minutes as needed. MAX 3 doses 03/28/18  Yes Barrett, Evelene Croon, PA-C  Probiotic Product (PROBIOTIC PO) Take 1 tablet by mouth daily.    Yes [provider]  vitamin C (ASCORBIC ACID) 500 MG tablet Take 500 mg by mouth at bedtime.   Yes [provider]  YUVAFEM 10 MCG TABS vaginal tablet Place 10 mcg vaginally 2 (two) times a week. On Tuesday and Friday 10/29/17  Yes [provider]     Allergies:    Allergies  Allergen Reactions  . Procaine Hcl Other (See Comments)    Unknown    Social History:   Social History   Socioeconomic History  . Marital status: Married    Spouse name: Not on file  . Number of children: 2  . Years of education: Not on file  . Highest education level: Not on file  Occupational History  . Occupation: Retired Product manager: RETIRED  Social Needs  . Financial resource  strain: Not on file  . Food insecurity:    Worry: Not on file    Inability: Not on file  . Transportation needs:    Medical: Not on file    Non-medical: Not on file  Tobacco Use  . Smoking status: Former Smoker    Types: Cigarettes    Last attempt to quit: 04/12/1977    Years since quitting: 40.9  . Smokeless tobacco: Never Used  . Tobacco comment: quit age 61  Substance and Sexual Activity  . Alcohol use: Yes    Alcohol/week: 0.0 standard drinks    Comment: rarely  . Drug use: No  . Sexual activity: Not Currently  Lifestyle  . Physical activity:    Days per week: Not on file    Minutes per session: Not on file  . Stress: Not on file  Relationships  . Social connections:    Talks on phone: Not on file    Gets together: Not on file    Attends religious service: Not on file    Active member of club or organization: Not on file    Attends meetings of clubs or organizations: Not on file    Relationship status: Not on file  . Intimate partner violence:    Fear of current or ex partner: Not on file    Emotionally abused: Not on file    Physically abused: Not on file    Forced sexual activity: Not on file  Other Topics Concern  . Not on file  Social History Narrative  . Not on file     Family History:   The patient's family history includes Heart disease in her brother, father, and mother; Ulcerative colitis in her daughter. There is no history of Colon cancer.     Review of Systems: [y] = yes, [ ]  = no   . General: Weight gain [ ] ; Weight loss [ ] ; Anorexia [ ] ; Fatigue [ ] ; Fever [ ] ; Chills [ ] ; Weakness [ ]   . Cardiac: Chest pain/pressure Blue.Reese ]; Resting SOB [ ] ; Exertional SOB [ ] ; Orthopnea [ ] ; Pedal Edema [ ] ; Palpitations [ ] ; Syncope [ ] ; Presyncope [ ] ; Paroxysmal nocturnal dyspnea[ ]   . Pulmonary: Cough [ ] ; Wheezing[ ] ; Hemoptysis[ ] ; Sputum [ ] ; Snoring [ ]   . GI: Vomiting[ ] ; Dysphagia[ ] ; Melena[ ] ; Hematochezia [ ] ; Heartburn[ ] ; Abdominal pain [ ] ;  Constipation [ ] ; Diarrhea [ ] ; BRBPR [ ]   . GU: Hematuria[ ] ; Dysuria [ ] ; Nocturia[ ]   . Vascular: Pain in legs with walking [ ] ; Pain in feet with lying flat [ ] ; Non-healing sores [ ] ; Stroke [ ] ; TIA [ ] ; Slurred speech [ ] ;  . Neuro: Headaches[ ] ; Vertigo[ ] ; Seizures[ ] ; Paresthesias[ ] ;Blurred vision [ ] ; Diplopia [ ] ; Vision changes [ ]   . Ortho/Skin: Arthritis [ ] ; Joint pain [ ] ; Muscle pain [ ] ; Joint swelling [ ] ; Back Pain [ ] ; Rash [ ]   . Psych: Depression[ ] ; Anxiety[ ]   . Heme: Bleeding problems [ ] ; Clotting disorders [ ] ; Anemia [ ]   . Endocrine: Diabetes [ ] ; Thyroid dysfunction[ ]      Physical Exam/Data:   Vitals:   03/28/18 2234 03/28/18 2245 03/28/18 2300 03/28/18 2315  BP:  131/82 122/62 115/74  Pulse:  71 60 (!) 59  Resp:  15 17 12   Temp:      TempSrc:      SpO2:  97% 97% 97%  Weight: 80.7 kg     Height: 5\' 5"  (1.651 m)      No intake or output data in the 24 hours ending 03/28/18 2330 Filed Weights   03/28/18 2234  Weight: 80.7 kg   Body mass index is 29.62 kg/m.  General:  Well nourished, well developed, in no acute distress HEENT: normal Lymph: no adenopathy Neck: no JVD Endocrine:  No thryomegaly Vascular: No carotid bruits; FA pulses 2+ bilaterally without bruits  Cardiac:  normal S1, S2; RRR; no murmur  Lungs:  clear to auscultation bilaterally, no wheezing, rhonchi or rales  Abd: soft, nontender, no hepatomegaly  Ext: no edema Musculoskeletal:  No deformities, BUE and BLE strength  normal and equal Skin: warm and dry  Neuro:  CNs 2-12 intact, no focal abnormalities noted Psych:  Normal affect    EKG:  The ECG that was done yesterday was personally reviewed and demonstrates normal sinus rhythm, rate 63 bpm with no acute ST-T wave changes   Laboratory Data:  Chemistry Recent Labs  Lab 03/28/18 2235  NA 141  K 3.9  CL 106  CO2 26  GLUCOSE 109*  BUN 12  CREATININE 0.93  CALCIUM 9.5  GFRNONAA >60  GFRAA >60  ANIONGAP 9    No  results for input(s): PROT, ALBUMIN, AST, ALT, ALKPHOS, BILITOT in the last 168 hours. Hematology Recent Labs  Lab 03/28/18 2235  WBC 7.2  RBC 5.52*  HGB 14.4  HCT 44.9  MCV 81.3  MCH 26.1  MCHC 32.1  RDW 14.6  PLT 215   Cardiac EnzymesNo results for input(s): TROPONINI in the last 168 hours.  Recent Labs  Lab 03/28/18 2237  TROPIPOC 0.00    BNPNo results for input(s): BNP, PROBNP in the last 168 hours.  DDimer No results for input(s): DDIMER in the last 168 hours.  Radiology/Studies:  Dg Chest 2 View  Result Date: 03/28/2018 CLINICAL DATA:  Chest pain EXAM: CHEST - 2 VIEW COMPARISON:  10/27/2016 FINDINGS: The heart size and mediastinal contours are within normal limits. Both lungs are clear. The visualized skeletal structures are unremarkable. IMPRESSION: No active cardiopulmonary disease. Electronically Signed   By: Ulyses Jarred M.D.   On: 03/28/2018 23:12    Assessment and Plan:   1. Unstable angina The patient has known CAD and a prior stent. She has been having off and on chest pain for the past 10 days (more noticeable when walking uphill). She has a positive stress test on 03/28/18/ She is not admitted with resting chest pain.  - Admit to telemetry - Cycle cardiac biomarkers - Obtain serial ECGs - ASA 81 mg daily - High dose statins and check lipid panel in the morning - Unfractionated IV heparin per ACS protocol - Keep the patient NPO for possible cardiac cath in the AM - Continue metoprolol to reduce myocardial demand - NTG prn.   Severity of Illness: The appropriate patient status for this patient is OBSERVATION. Observation status is judged to be reasonable and necessary in order to provide the required intensity of service to ensure the patient's safety. The patient's presenting symptoms, physical exam findings, and initial radiographic and laboratory data in the context of their medical condition is felt to place them at decreased risk for further  clinical deterioration. Furthermore, it is anticipated that the patient will be medically stable for discharge from the hospital within 2 midnights of admission. The following factors support the patient status of observation.   " The patient's presenting symptoms include chest pain. " The physical exam findings include normal exams. " The initial radiographic and laboratory data are positive stress test.     For questions or updates, please contact [facility] Please consult [email or web address] for contact info under Cardiology/STEMI.    Signed, Meade Maw, MD  03/28/2018 11:30 PM

## 2018-03-29 ENCOUNTER — Encounter (HOSPITAL_COMMUNITY): Payer: Self-pay | Admitting: *Deleted

## 2018-03-29 ENCOUNTER — Other Ambulatory Visit: Payer: Self-pay

## 2018-03-29 ENCOUNTER — Encounter (HOSPITAL_COMMUNITY)
Admission: EM | Disposition: A | Discharge status: Home / Self Care | Payer: Self-pay | Source: Home / Self Care | Attending: Emergency Medicine

## 2018-03-29 DIAGNOSIS — Z7902 Long term (current) use of antithrombotics/antiplatelets: Secondary | ICD-10-CM | POA: Diagnosis not present

## 2018-03-29 DIAGNOSIS — Z7982 Long term (current) use of aspirin: Secondary | ICD-10-CM | POA: Diagnosis not present

## 2018-03-29 DIAGNOSIS — Z87891 Personal history of nicotine dependence: Secondary | ICD-10-CM | POA: Diagnosis not present

## 2018-03-29 DIAGNOSIS — I352 Nonrheumatic aortic (valve) stenosis with insufficiency: Secondary | ICD-10-CM | POA: Diagnosis not present

## 2018-03-29 DIAGNOSIS — I25110 Atherosclerotic heart disease of native coronary artery with unstable angina pectoris: Secondary | ICD-10-CM | POA: Diagnosis not present

## 2018-03-29 DIAGNOSIS — I200 Unstable angina: Secondary | ICD-10-CM | POA: Diagnosis not present

## 2018-03-29 DIAGNOSIS — I350 Nonrheumatic aortic (valve) stenosis: Secondary | ICD-10-CM | POA: Diagnosis not present

## 2018-03-29 DIAGNOSIS — Z7951 Long term (current) use of inhaled steroids: Secondary | ICD-10-CM | POA: Diagnosis not present

## 2018-03-29 DIAGNOSIS — M8580 Other specified disorders of bone density and structure, unspecified site: Secondary | ICD-10-CM | POA: Diagnosis not present

## 2018-03-29 DIAGNOSIS — T82855A Stenosis of coronary artery stent, initial encounter: Secondary | ICD-10-CM | POA: Diagnosis not present

## 2018-03-29 DIAGNOSIS — I10 Essential (primary) hypertension: Secondary | ICD-10-CM | POA: Diagnosis not present

## 2018-03-29 DIAGNOSIS — Z8249 Family history of ischemic heart disease and other diseases of the circulatory system: Secondary | ICD-10-CM | POA: Diagnosis not present

## 2018-03-29 DIAGNOSIS — E785 Hyperlipidemia, unspecified: Secondary | ICD-10-CM | POA: Diagnosis not present

## 2018-03-29 HISTORY — PX: INTRAVASCULAR PRESSURE WIRE/FFR STUDY: CATH118243

## 2018-03-29 HISTORY — PX: LEFT HEART CATH AND CORONARY ANGIOGRAPHY: CATH118249

## 2018-03-29 HISTORY — PX: AORTIC ARCH ANGIOGRAPHY: CATH118224

## 2018-03-29 LAB — TROPONIN I: Troponin I: <0.03 ng/mL (ref <0.03)

## 2018-03-29 LAB — LIPID PANEL
Cholesterol: 111 mg/dL (ref 0–200)
HDL: 55 mg/dL (ref >40)
LDL Cholesterol: 52 mg/dL (ref 0–99)
Total CHOL/HDL Ratio: 2 RATIO
Triglycerides: 20 mg/dL (ref <150)
VLDL: 4 mg/dL (ref 0–40)

## 2018-03-29 LAB — BASIC METABOLIC PANEL
Anion gap: 9 (ref 5–15)
BUN: 9 mg/dL (ref 8–23)
CO2: 27 mmol/L (ref 22–32)
Calcium: 9.5 mg/dL (ref 8.9–10.3)
Chloride: 106 mmol/L (ref 98–111)
Creatinine, Ser: 0.88 mg/dL (ref 0.44–1.00)
GFR calc Af Amer: >60 mL/min (ref >60)
GFR calc non Af Amer: >60 mL/min (ref >60)
Glucose, Bld: 98 mg/dL (ref 70–99)
Potassium: 4.2 mmol/L (ref 3.5–5.1)
Sodium: 142 mmol/L (ref 135–145)

## 2018-03-29 LAB — CBC
HCT: 42.8 % (ref 36.0–46.0)
Hemoglobin: 13.9 g/dL (ref 12.0–15.0)
MCH: 26.3 pg (ref 26.0–34.0)
MCHC: 32.5 g/dL (ref 30.0–36.0)
MCV: 81.1 fL (ref 80.0–100.0)
Platelets: 226 10*3/uL (ref 150–400)
RBC: 5.28 MIL/uL — ABNORMAL HIGH (ref 3.87–5.11)
RDW: 14.6 % (ref 11.5–15.5)
WBC: 7.3 10*3/uL (ref 4.0–10.5)
nRBC: 0 % (ref 0.0–0.2)

## 2018-03-29 LAB — POCT ACTIVATED CLOTTING TIME: Activated Clotting Time: 318 seconds

## 2018-03-29 LAB — PROTIME-INR
INR: 1.12
Prothrombin Time: 14.3 seconds (ref 11.4–15.2)

## 2018-03-29 LAB — HEPARIN LEVEL (UNFRACTIONATED): Heparin Unfractionated: 1.02 IU/mL — ABNORMAL HIGH (ref 0.30–0.70)

## 2018-03-29 SURGERY — LEFT HEART CATH AND CORONARY ANGIOGRAPHY
Anesthesia: LOCAL

## 2018-03-29 MED ORDER — ACETAMINOPHEN 325 MG PO TABS: 650.0000 mg | ORAL_TABLET | ORAL | Status: DC | PRN

## 2018-03-29 MED ORDER — NITROGLYCERIN 0.4 MG SL SUBL
0.4000 mg | SUBLINGUAL_TABLET | SUBLINGUAL | Status: DC | PRN
  Administered 2018-03-29 (×5): 0.4 mg via SUBLINGUAL
  Filled 2018-03-29 (×3): qty 1

## 2018-03-29 MED ORDER — CLOPIDOGREL BISULFATE 75 MG PO TABS
75.0000 mg | ORAL_TABLET | Freq: Every day | ORAL | Status: DC
  Administered 2018-03-29: 75 mg via ORAL
  Filled 2018-03-29: qty 1

## 2018-03-29 MED ORDER — HYDRALAZINE HCL 20 MG/ML IJ SOLN: 5.0000 mg | INTRAMUSCULAR | Status: AC | PRN

## 2018-03-29 MED ORDER — SODIUM CHLORIDE 0.9 % IV SOLN: 250.0000 mL | INTRAVENOUS | Status: DC | PRN

## 2018-03-29 MED ORDER — FENTANYL CITRATE (PF) 100 MCG/2ML IJ SOLN
INTRAMUSCULAR | Status: AC
  Filled 2018-03-29: qty 2

## 2018-03-29 MED ORDER — ASPIRIN EC 81 MG PO TBEC
81.0000 mg | DELAYED_RELEASE_TABLET | Freq: Every day | ORAL | Status: DC
  Administered 2018-03-29: 81 mg via ORAL
  Filled 2018-03-29: qty 1

## 2018-03-29 MED ORDER — NITROGLYCERIN 1 MG/10 ML FOR IR/CATH LAB
INTRA_ARTERIAL | Status: AC
  Filled 2018-03-29: qty 10

## 2018-03-29 MED ORDER — SODIUM CHLORIDE 0.9 % WEIGHT BASED INFUSION: 3.0000 mL/kg/h | INTRAVENOUS | Status: DC

## 2018-03-29 MED ORDER — SODIUM CHLORIDE 0.9 % WEIGHT BASED INFUSION
1.0000 mL/kg/h | INTRAVENOUS | Status: DC
  Administered 2018-03-29: 1 mL/kg/h via INTRAVENOUS

## 2018-03-29 MED ORDER — NITROGLYCERIN 1 MG/10 ML FOR IR/CATH LAB
INTRA_ARTERIAL | Status: DC | PRN
  Administered 2018-03-29: 200 ug via INTRACORONARY

## 2018-03-29 MED ORDER — LIDOCAINE HCL (PF) 1 % IJ SOLN
INTRAMUSCULAR | Status: AC
  Filled 2018-03-29: qty 30

## 2018-03-29 MED ORDER — SODIUM CHLORIDE 0.9 % IV SOLN: INTRAVENOUS | Status: AC

## 2018-03-29 MED ORDER — VERAPAMIL HCL 2.5 MG/ML IV SOLN
INTRAVENOUS | Status: AC
  Filled 2018-03-29: qty 2

## 2018-03-29 MED ORDER — LABETALOL HCL 5 MG/ML IV SOLN: 10.0000 mg | INTRAVENOUS | Status: AC | PRN

## 2018-03-29 MED ORDER — HEPARIN SODIUM (PORCINE) 1000 UNIT/ML IJ SOLN
INTRAMUSCULAR | Status: DC | PRN
  Administered 2018-03-29 (×2): 4000 [IU] via INTRAVENOUS

## 2018-03-29 MED ORDER — ASPIRIN 81 MG PO CHEW: 81.0000 mg | CHEWABLE_TABLET | ORAL | Status: DC

## 2018-03-29 MED ORDER — MIDAZOLAM HCL 2 MG/2ML IJ SOLN
INTRAMUSCULAR | Status: DC | PRN
  Administered 2018-03-29: 1 mg via INTRAVENOUS

## 2018-03-29 MED ORDER — IOHEXOL 350 MG/ML SOLN
INTRAVENOUS | Status: DC | PRN
  Administered 2018-03-29: 120 mL

## 2018-03-29 MED ORDER — SODIUM CHLORIDE 0.9% FLUSH: 3.0000 mL | INTRAVENOUS | Status: DC | PRN

## 2018-03-29 MED ORDER — SODIUM CHLORIDE 0.9 % WEIGHT BASED INFUSION: 1.0000 mL/kg/h | INTRAVENOUS | Status: DC

## 2018-03-29 MED ORDER — ONDANSETRON HCL 4 MG/2ML IJ SOLN: 4.0000 mg | Freq: Four times a day (QID) | INTRAMUSCULAR | Status: DC | PRN

## 2018-03-29 MED ORDER — MORPHINE SULFATE (PF) 2 MG/ML IV SOLN
2.0000 mg | Freq: Once | INTRAVENOUS | Status: AC
  Administered 2018-03-29: 2 mg via INTRAVENOUS
  Filled 2018-03-29: qty 1

## 2018-03-29 MED ORDER — SODIUM CHLORIDE 0.9% FLUSH: 3.0000 mL | Freq: Two times a day (BID) | INTRAVENOUS | Status: DC

## 2018-03-29 MED ORDER — ASPIRIN 81 MG PO CHEW: 81.0000 mg | CHEWABLE_TABLET | ORAL | Status: AC

## 2018-03-29 MED ORDER — HEPARIN (PORCINE) IN NACL 1000-0.9 UT/500ML-% IV SOLN
INTRAVENOUS | Status: DC | PRN
  Administered 2018-03-29 (×2): 500 mL

## 2018-03-29 MED ORDER — MIDAZOLAM HCL 2 MG/2ML IJ SOLN
INTRAMUSCULAR | Status: AC
  Filled 2018-03-29: qty 2

## 2018-03-29 MED ORDER — HEPARIN (PORCINE) IN NACL 1000-0.9 UT/500ML-% IV SOLN
INTRAVENOUS | Status: AC
  Filled 2018-03-29: qty 1000

## 2018-03-29 MED ORDER — ENOXAPARIN SODIUM 40 MG/0.4ML ~~LOC~~ SOLN: 40.0000 mg | SUBCUTANEOUS | Status: DC

## 2018-03-29 MED ORDER — HEPARIN SODIUM (PORCINE) 1000 UNIT/ML IJ SOLN
INTRAMUSCULAR | Status: AC
  Filled 2018-03-29: qty 1

## 2018-03-29 MED ORDER — LIDOCAINE HCL (PF) 1 % IJ SOLN
INTRAMUSCULAR | Status: DC | PRN
  Administered 2018-03-29: 2 mL

## 2018-03-29 MED ORDER — CLOPIDOGREL BISULFATE 75 MG PO TABS: 75.0000 mg | ORAL_TABLET | Freq: Every day | ORAL | 11 refills | Status: DC

## 2018-03-29 MED ORDER — FENTANYL CITRATE (PF) 100 MCG/2ML IJ SOLN
INTRAMUSCULAR | Status: DC | PRN
  Administered 2018-03-29: 50 ug via INTRAVENOUS

## 2018-03-29 MED ORDER — VERAPAMIL HCL 2.5 MG/ML IV SOLN
INTRAVENOUS | Status: DC | PRN
  Administered 2018-03-29: 10 mL via INTRA_ARTERIAL

## 2018-03-29 SURGICAL SUPPLY — 14 items
CATH 5FR JL3.5 JR4 ANG PIG MP (CATHETERS) ×2 IMPLANT
CATH VISTA GUIDE 6FR XBLAD3.0 (CATHETERS) ×2 IMPLANT
DEVICE RAD COMP TR BAND LRG (VASCULAR PRODUCTS) ×2 IMPLANT
GLIDESHEATH SLEND SS 6F .021 (SHEATH) ×2 IMPLANT
GUIDEWIRE INQWIRE 1.5J.035X260 (WIRE) ×1 IMPLANT
GUIDEWIRE PRESSURE COMET II (WIRE) ×2 IMPLANT
INQWIRE 1.5J .035X260CM (WIRE) ×2
KIT ESSENTIALS PG (KITS) ×2 IMPLANT
KIT HEART LEFT (KITS) ×2 IMPLANT
PACK CARDIAC CATHETERIZATION (CUSTOM PROCEDURE TRAY) ×2 IMPLANT
SYR MEDRAD MARK 7 150ML (SYRINGE) ×2 IMPLANT
TRANSDUCER W/STOPCOCK (MISCELLANEOUS) ×2 IMPLANT
TUBING CIL FLEX 10 FLL-RA (TUBING) ×2 IMPLANT
WIRE HI TORQ VERSACORE-J 145CM (WIRE) ×2 IMPLANT

## 2018-03-29 NOTE — Progress Notes (Signed)
Patient returned from cath lab instable condition. TR band to R radial w scant old drainage, level 0 and fingers warm and neuro intact. She denies any pain. CCMD notified, telemetry reapplied, radial restrictions reviewed w her and husband and they verb understanding. Will cont to monitor.

## 2018-03-29 NOTE — Discharge Summary (Signed)
Discharge Summary    Patient ID: Anne Martin MRN: [id number]; DOB: 09-10-1945  Admit date: 03/28/2018 Discharge date: 03/29/2018  Primary Care Provider: Laurey Morale, MD  Primary Cardiologist: Kirk Ruths, MD  Primary Electrophysiologist:  None   Discharge Diagnoses    Principal Problem:   Unstable angina [facility]) Active Problems:   Hyperlipidemia   CAD (coronary artery disease)   Aortic stenosis   Allergies Allergies  Allergen Reactions  . Procaine Hcl Other (See Comments)    Unknown    Diagnostic Studies/Procedures    Left Heart Catheterization 03/29/2018: Findings: 1. Mild to moderate, non-obstructive CAD involving mid LAD that is not hemodynamically significant (DFR 0.93). 2. Mild to moderate in-stent restenosis involving D2, which is not hemodynamically significant (DFR 0.93). 3. Mild aortic stenosis and aortic regurgitation. 4. Normal left ventricular filling pressure.  Recommendations: 1. Medical therapy and secondary prevention.   History of Present Illness     Anne Martin is a 72 year old female with a history of CAD s/p PCI with DES to D2 in 2011, mild aortic stenosis, hyperlipidemia, and osteopenia who presented to the [facility] ED on 03/28/2018 for evaluation of chest heaviness.   Patient was recently seen in outpatient office on 03/27/2018 at which time she reported episodes of "band-like" pain around her chest consistently with exertion. Patient ranks pain as 5/10 and states it is eventually relieved by rest but sometimes lasts all day. Given exertional symptoms, an exercise myoview was ordered. Myoview on 03/28/2018 showed a moderate area of anterior wall ischemia from apex to base; therefore, considered a high-risk study. Given abnormal stress test, patient was initially scheduled for outpatient cardiac catheterization on 03/30/2018; however, patient developed worsening chest pain after myoview and presented to the ED for further evaluation. Patient  describes feeling uncomfortable as if there is "fist" in the center of her chest since the stress test. Pain lasted for a few hours so patient finally decided to call 911. EMS gave patient Aspirin and sublingual Nitroglycerin which resolved the pain completely. She denies any associated diaphoresis, nausea, or vomiting with the pain. She also denies any orthopnea, PND, or lower extremity edema.   Upon arrival to the ED, vitals stable. EKG showed no acute ischemic changes. I-stat troponin negative. Chest x-ray showed no acute findings. CBC was unremarkable. Na 141, K 3.9, Glucose 109, SCr 0.93. Patient was admitted for unstable angina.   Hospital Course     Consultants: None.  Anne Martin was admitted for unstable angina as stated above. Repeat troponin also negative. Patient underwent left heart catheterization which showed mild to moderate non-obstructive CAD involving the mid LAD as well as mild to moderate in-stent restenosis involving D2. She had a negative IFR evaluation of both the first diagonal and the mid LAD. Patient tolerated procedure well.  Dr. Burt Knack recommended no change in home regimen except addition of Plavix (recommended by Dr. Saunders Revel).   She has been seen by Dr. Burt Knack  today and deemed ready for discharge home. All follow-up appointments have been scheduled. Discharge medications are listed below.   Discharge Vitals Blood pressure 123/68, pulse 63, temperature 98 F (36.7 C), temperature source Oral, resp. rate 10, height 5\' 5"  (1.651 m), weight 82 kg, SpO2 96 %.  Filed Weights   03/28/18 2234 03/29/18 0158  Weight: 80.7 kg 82 kg    Labs & Radiologic Studies    CBC Recent Labs    03/28/18 2235 03/29/18 0218  WBC 7.2 7.3  HGB 14.4  13.9  HCT 44.9 42.8  MCV 81.3 81.1  PLT 215 580   Basic Metabolic Panel Recent Labs    03/28/18 2235 03/29/18 0218  NA 141 142  K 3.9 4.2  CL 106 106  CO2 26 27  GLUCOSE 109* 98  BUN 12 9  CREATININE 0.93 0.88  CALCIUM 9.5 9.5    Cardiac Enzymes Recent Labs    03/29/18 0745  TROPONINI <0.03   Fasting Lipid Panel Recent Labs    03/29/18 0218  CHOL 111  HDL 55  LDLCALC 52  TRIG 20  CHOLHDL 2.0    Dg Chest 2 View  Result Date: 03/28/2018 CLINICAL DATA:  Chest pain EXAM: CHEST - 2 VIEW COMPARISON:  10/27/2016 FINDINGS: The heart size and mediastinal contours are within normal limits. Both lungs are clear. The visualized skeletal structures are unremarkable. IMPRESSION: No active cardiopulmonary disease. Electronically Signed   By: Ulyses Jarred M.D.   On: 03/28/2018 23:12   Disposition   Patient is being discharged home today in good condition.  Follow-up Plans & Appointments    Follow-up Information    Barrett, Evelene Croon, PA-C. Go on 04/13/2018.   Specialties:  Cardiology, Radiology Why:  @9 :30am for hospital follow up  Contact information: [address] [place] [address] [phone]          Discharge Instructions    Diet - low sodium heart healthy   Complete by:  As directed    Discharge instructions   Complete by:  As directed    No driving for 48 hours. No lifting over 5 lbs for 1 week. No sexual activity for 1 week.  Keep procedure site clean & dry. If you notice increased pain, swelling, bleeding or pus, call/return!  You may shower, but no soaking baths/hot tubs/pools for 1 week.   Increase activity slowly   Complete by:  As directed       Discharge Medications   Allergies as of 03/29/2018      Reactions   Procaine Hcl Other (See Comments)   Unknown      Medication List    TAKE these medications   aspirin 81 MG tablet Take 81 mg by mouth daily.   atorvastatin 80 MG tablet Commonly known as:  LIPITOR Take 1 tablet (80 mg total) by mouth daily at 6 PM.   CALCIUM 600 1500 (600 Ca) MG Tabs tablet Generic drug:  calcium carbonate Take 600 mg of elemental calcium by mouth daily.   cholecalciferol 1000 units tablet Commonly known as:  VITAMIN D Take  1,000 Units by mouth every evening.   CLARITIN 10 MG Caps Generic drug:  Loratadine Take 10 mg by mouth daily. 24 hr tabs   clopidogrel 75 MG tablet Commonly known as:  PLAVIX Take 1 tablet (75 mg total) by mouth daily. Start taking on:  March 30, 2018   FLONASE 50 MCG/ACT nasal spray Generic drug:  fluticasone Place 2 sprays into both nostrils daily as needed for allergies. AS NEEDED   isosorbide mononitrate 30 MG 24 hr tablet Commonly known as:  IMDUR Take 1 tablet (30 mg total) by mouth daily.   metoprolol succinate 25 MG 24 hr tablet Commonly known as:  TOPROL-XL Take 1 tablet (25 mg total) by mouth daily.   nitroGLYCERIN 0.4 MG SL tablet Commonly known as:  NITROSTAT Place 1 tablet (0.4 mg total) under the tongue every 5 (five) minutes as needed. MAX 3 doses   PROBIOTIC PO Take 1  tablet by mouth daily.   vitamin C 500 MG tablet Commonly known as:  ASCORBIC ACID Take 500 mg by mouth at bedtime.   YUVAFEM 10 MCG Tabs vaginal tablet Generic drug:  Estradiol Place 10 mcg vaginally 2 (two) times a week. On Tuesday and Friday          Outstanding Labs/Studies   None.  Duration of Discharge Encounter   Greater than 30 minutes including physician time.  [place] Longest [place], PA 03/29/2018, 4:24 PM

## 2018-03-29 NOTE — Discharge Instructions (Signed)
Post Cardiac Catheterization: NO HEAVY LIFTING OR SEXUAL ACTIVITY X 7 DAYS. NO DRIVING X 2-3 DAYS. NO SOAKING BATHS, HOT TUBS, POOLS, ETC., X 7 DAYS.  Radial Site Care: Refer to this sheet in the next few weeks. These instructions provide you with information on caring for yourself after your procedure. Your caregiver may also give you more specific instructions. Your treatment has been planned according to current medical practices, but problems sometimes occur. Call your caregiver if you have any problems or questions after your procedure. HOME CARE INSTRUCTIONS  You may shower the day after the procedure.Remove the bandage (dressing) and gently wash the site with plain soap and water.Gently pat the site dry.   Do not apply powder or lotion to the site.   Do not submerge the affected site in water for 3 to 5 days.   Inspect the site at least twice daily.   Do not flex or bend the affected arm for 24 hours.   No lifting over 5 pounds (2.3 kg) for 5 days after your procedure.   Do not drive home if you are discharged the same day of the procedure. Have someone else drive you.  What to expect:  Any bruising will usually fade within 1 to 2 weeks.   Blood that collects in the tissue (hematoma) may be painful to the touch. It should usually decrease in size and tenderness within 1 to 2 weeks.  SEEK IMMEDIATE MEDICAL CARE IF:  You have unusual pain at the radial site.   You have redness, warmth, swelling, or pain at the radial site.   You have drainage (other than a small amount of blood on the dressing).   You have chills.   You have a fever or persistent symptoms for more than 72 hours.   You have a fever and your symptoms suddenly get worse.   Your arm becomes pale, cool, tingly, or numb.   You have heavy bleeding from the site. Hold pressure on the site.   

## 2018-03-29 NOTE — Progress Notes (Addendum)
Progress Note  Patient Name: Anne Martin Date of Encounter: 03/29/2018  Primary Cardiologist: Kirk Ruths, MD   Subjective   Patient continues to have some chest pain this morning but has improved since getting Morphine. She describes the pain as a "band around her chest." Pain is 2/10 when she is resting in bed and 4/10 when she sits up. She has no other complaints at this time.   Inpatient Medications    Scheduled Meds: . [START ON 03/30/2018] aspirin EC  81 mg Oral Daily  . atorvastatin  80 mg Oral q1800  . metoprolol succinate  25 mg Oral Daily   Continuous Infusions: . heparin 1,000 Units/hr (03/29/18 0400)   PRN Meds: acetaminophen, nitroGLYCERIN, ondansetron (ZOFRAN) IV   Vital Signs    Vitals:   03/29/18 0100 03/29/18 0115 03/29/18 0158 03/29/18 0318  BP: (!) 99/59 (!) 103/59 125/71 121/63  Pulse: 72 64 63 67  Resp: 17 16 19 14   Temp:   97.6 F (36.4 C) 97.7 F (36.5 C)  TempSrc:   Oral Oral  SpO2: 92% 94% 98% 99%  Weight:   82 kg   Height:   5\' 5"  (1.651 m)     Intake/Output Summary (Last 24 hours) at 03/29/2018 0638 Last data filed at 03/29/2018 0400 Gross per 24 hour  Intake 57.2 ml  Output -  Net 57.2 ml   Filed Weights   03/28/18 2234 03/29/18 0158  Weight: 80.7 kg 82 kg    Telemetry    Sinus rhythm with heart rates in the high 50's to 70's. - Personally Reviewed  ECG    Sinus bradycardia, rate 59 bpm, with no acute ischemic changes. - Personally Reviewed  Physical Exam   GEN: Caucasian female resting comfortably. Alert and in no acute distress.   Neck: Supple. Cardiac: RRR. II-III/VI systolic murmur heard best at the right upper sternal border. No rubs or gallops.  Respiratory: Clear to auscultation bilaterally. No wheezes, rhonchi, or rales.  GI: Abdomen soft, non-distended, and non-tender. Bowel sounds present.  Vascular: Radial and posterior tibial pulses 2+ and equal bilaterally.  MS: No lower extremity edema. Skin: Warm  and dry.  Neuro:  No focal deficits.  Psych: Normal affect.  Labs    Chemistry Recent Labs  Lab 03/28/18 2235 03/29/18 0218  NA 141 142  K 3.9 4.2  CL 106 106  CO2 26 27  GLUCOSE 109* 98  BUN 12 9  CREATININE 0.93 0.88  CALCIUM 9.5 9.5  GFRNONAA >60 >60  GFRAA >60 >60  ANIONGAP 9 9     Hematology Recent Labs  Lab 03/28/18 2235 03/29/18 0218  WBC 7.2 7.3  RBC 5.52* 5.28*  HGB 14.4 13.9  HCT 44.9 42.8  MCV 81.3 81.1  MCH 26.1 26.3  MCHC 32.1 32.5  RDW 14.6 14.6  PLT 215 226    Cardiac EnzymesNo results for input(s): TROPONINI in the last 168 hours.  Recent Labs  Lab 03/28/18 2237  TROPIPOC 0.00     BNPNo results for input(s): BNP, PROBNP in the last 168 hours.   DDimer No results for input(s): DDIMER in the last 168 hours.   Radiology    Dg Chest 2 View  Result Date: 03/28/2018 CLINICAL DATA:  Chest pain EXAM: CHEST - 2 VIEW COMPARISON:  10/27/2016 FINDINGS: The heart size and mediastinal contours are within normal limits. Both lungs are clear. The visualized skeletal structures are unremarkable. IMPRESSION: No active cardiopulmonary disease. Electronically Signed   By:  Ulyses Jarred M.D.   On: 03/28/2018 23:12    Cardiac Studies   Myoview 03/28/2018:  The left ventricular ejection fraction is hyperdynamic (>65%).  Nuclear stress EF: 71%.  Upsloping ST segment depression ST segment depression of 2 mm was noted during stress in the II, III, aVF, V5 and V6 leads.  Findings consistent with ischemia.  This is a high risk study.   Conclusion: - Moderate area of moderate anterior wall ischemia from apex to base. TID positive at 1.67 - Positive ECG response to exercise. EF estimated at 71% _______________  Echocardiogram 11/17/2017: Study Conclusions: - Left ventricle: The cavity size was normal. Systolic function was   normal. The estimated ejection fraction was in the range of 60%   to 65%. Wall motion was normal; there were no regional wall    motion abnormalities. Doppler parameters are consistent with   abnormal left ventricular relaxation (grade 1 diastolic   dysfunction). Doppler parameters are consistent with high   ventricular filling pressure. - Aortic valve: There was mild stenosis. There was moderate   regurgitation. Peak velocity (S): 224 cm/s. Mean gradient (S): 10   mm Hg. Valve area (VTI): 1.63 cm^2. Valve area (Vmax): 1.72 cm^2.   Valve area (Vmean): 1.6 cm^2. - Mitral valve: Transvalvular velocity was within the normal range.   There was no evidence for stenosis. There was trivial   regurgitation. - Right ventricle: The cavity size was normal. Wall thickness was   normal. Systolic function was normal. - Atrial septum: No defect or patent foramen ovale was identified. - Tricuspid valve: There was trivial regurgitation. - Pulmonary arteries: Systolic pressure was within the normal   range. PA peak pressure: 29 mm Hg (S).  Patient Profile     Anne Martin is a 72 year old female with a history of CAD s/p PCI with DES to D2 in 2011, mild aortic stenosis, hyperlipidemia, and osteopenia who presents to the hospital for evaluation of chest heaviness. Patient had an outpatient Myoview on 03/28/2018 with findings consistent with ischemia. She was originally scheduled for an outpatient left heart catheterization on 03/30/2018; however, symptoms worsened after the Myoview so she presented to the [facility] ED for further evaluation.   Assessment & Plan    Unstable Angina - EKG showed sinus rhythm with no acute ischemic changes. - I-stat troponin negative. Will recheck. - Myoview on 03/28/2018 showed moderate area at anterior wall ischemia with a positive EKG response to exercise.  - Patient required dose of IV Morphine earlier this morning after no relief with SL Nitro.  - Patient continues to report some mild chest pain at rest that worsens when she sits up. - Continue IV Heparin drip. - Continue Aspirin, statin, and  beta-blocker. - Given worsening symptoms and abnormal Myoview, anticipate left heart catheterization later today (outpatient catheterization originally scheduled for tomorrow).   Hyperlipidemia - LDL 52 this admission. - Continue Lipitor 80mg  daily.   Mild AS - Echo in 11/2017 showed mean gradient of 10 mmHg.  For questions or updates, please contact [place] Please consult [email or web address] for contact info under     Signed, Darreld Mclean, PA-C  03/29/2018, 6:38 AM    Patient seen, examined. Available data reviewed. Agree with findings, assessment, and plan as outlined by Sande Rives, PA-C.  The patient is independently interviewed and examined.  On my exam she is an alert, oriented woman in no distress.  JVP is normal, lungs are clear, heart is regular rate and  rhythm with 2/6 SEM at the RUSB, abdomen is soft and nontender, extremities have no edema.  There is a TR band in place on the right wrist with good hemostasis and no swelling.  I reviewed the patient's cardiac catheterization study.  She had a negative IFR evaluation of both the first diagonal and the mid LAD.  She has nonobstructive coronary artery disease and should continue with medical therapy.  We reviewed her medical program which includes aspirin, high intensity statin drug, metoprolol, and isosorbide is been recently added.  I did not recommend any specific changes.  She will follow-up closely as an outpatient with Dr. Stanford Breed or his APP.  All of her questions are answered.  We reviewed post catheterization restrictions and instructions.  Sherren Mocha, M.D. 03/29/2018 3:38 PM

## 2018-03-29 NOTE — Interval H&P Note (Signed)
History and Physical Interval Note:  03/29/2018 10:02 AM  Anne Martin  has presented today for cardiac catheterization, with the diagnosis of unstable angina. The various methods of treatment have been discussed with the patient and family. After consideration of risks, benefits and other options for treatment, the patient has consented to  Procedure(s): LEFT HEART CATH AND CORONARY ANGIOGRAPHY (N/A) as a surgical intervention .  The patient's history has been reviewed, patient examined, no change in status, stable for surgery.  I have reviewed the patient's chart and labs.  Questions were answered to the patient's satisfaction.    Cath Lab Visit (complete for each Cath Lab visit)  Clinical Evaluation Leading to the Procedure:   ACS: Yes.    Non-ACS:  N/A  Tobie Hellen

## 2018-03-29 NOTE — Plan of Care (Signed)
  Problem: Education: Goal: Understanding of cardiac disease, CV risk reduction, and recovery process will improve Outcome: Adequate for Discharge Goal: Individualized Educational Video(s) Outcome: Adequate for Discharge   Problem: Activity: Goal: Ability to tolerate increased activity will improve Outcome: Adequate for Discharge   Problem: Cardiac: Goal: Ability to achieve and maintain adequate cardiovascular perfusion will improve Outcome: Adequate for Discharge   Problem: Health Behavior/Discharge Planning: Goal: Ability to safely manage health-related needs after discharge will improve Outcome: Adequate for Discharge   Problem: Education: Goal: Knowledge of General Education information will improve Description Including pain rating scale, medication(s)/side effects and non-pharmacologic comfort measures Outcome: Adequate for Discharge   Problem: Pain Managment: Goal: General experience of comfort will improve Outcome: Adequate for Discharge

## 2018-03-29 NOTE — Care Management Obs Status (Signed)
[place] NOTIFICATION   Patient Details  Name: Anne Martin MRN: [id number] Date of Birth: 05/18/1945   Medicare Observation Status Notification Given:  Yes    Dawayne Patricia, RN 03/29/2018, 4:17 PM

## 2018-03-29 NOTE — Progress Notes (Signed)
Discharge instructions reviewed with patient and husband and they deny questions. All belongings secured and patient discharged with R radial pressure dressing clean and dry and she is aware of precautions. She is discharged via wheelchair to family vehicle.

## 2018-03-29 NOTE — Brief Op Note (Addendum)
BRIEF CARDIAC CATHETERIZATION NOTE  DATE: 03/29/2018 TIME: 11:26 AM  PATIENT:  Anne Martin  72 y.o. female  PRE-OPERATIVE DIAGNOSIS:  Unstable angina  POST-OPERATIVE DIAGNOSIS:  Non-obstructive CAD  PROCEDURE:  Procedure(s): LEFT HEART CATH AND CORONARY ANGIOGRAPHY (N/A) AORTIC ARCH ANGIOGRAPHY (N/A) INTRAVASCULAR PRESSURE WIRE/FFR STUDY (N/A)  SURGEON:  Surgeon(s) and Role:    * Aleayah Chico, MD - Primary  FINDINGS: 1. Mild to moderate, non-obstructive CAD involving mid LAD that is not hemodynamically significant (DFR 0.93). 2. Mild to moderate in-stent restenosis involving D2, which is not hemodynamically significant (DFR 0.93). 3. Mild aortic stenosis and aortic regurgitation. 4. Normal left ventricular filling pressure.  RECOMMENDATIONS: 1. Medical therapy and secondary prevention.  Nelva Bush, MD [facility] Pager: [phone]

## 2018-03-29 NOTE — Progress Notes (Signed)
ANTICOAGULATION CONSULT NOTE - Initial Consult  Pharmacy Consult for heparin Indication: USAP  Allergies  Allergen Reactions  . Procaine Hcl Other (See Comments)    Unknown    Patient Measurements: Height: 5\' 5"  (165.1 cm) Weight: 180 lb 12.4 oz (82 kg) IBW/kg (Calculated) : 57 Heparin Dosing Weight: 75kg  Vital Signs: Temp: 97.7 F (36.5 C) (12/18 0318) Temp Source: Oral (12/18 0318) BP: 121/63 (12/18 0318) Pulse Rate: 67 (12/18 0318)  Labs: Recent Labs    03/28/18 2235 03/29/18 0218 03/29/18 0722  HGB 14.4 13.9  --   HCT 44.9 42.8  --   PLT 215 226  --   LABPROT  --  14.3  --   INR  --  1.12  --   HEPARINUNFRC  --   --  1.02*  CREATININE 0.93 0.88  --     Estimated Creatinine Clearance: 60.2 mL/min (by C-G formula based on SCr of 0.88 mg/dL).   Medical History: Past Medical History:  Diagnosis Date  . Allergy    seasonal  . Anemia    prior to hysterectomy, fibroids  . Aortic insufficiency    a. 10/2015 Echo: mild to moderate AI.  Marland Kitchen Aortic stenosis    a. 10/2015 Echo: moderate AS.  Marland Kitchen Blood transfusion without reported diagnosis    with hysterectomy  . CAD (coronary artery disease)    a. 08/2009 s/p cath after abnormal nuclear study; LM nl, LAD 60%, D2 90% (PCI/DES), LCX 50 -60%, RCA nl, nl EF;  b. 2012 MV: no ischemia/infarct;  c. 08/2015 MV: no ischemia/infarct, EF 70%.  . Diastolic dysfunction    a. 10/2015 Echo: EF 55-60%, no rwma, Gr1 DD, mod AS, mild to mod AI.  Marland Kitchen Essential hypertension   . H/O: rheumatic fever   . History of colonoscopy   . Hyperlipidemia   . Osteopenia    last DEXA 11-22-13     Assessment: 72yo female had abnormal stress test today, awaiting cath on Thursday, now c/o continued CP that improved w/ NTG x2, troponin currently negative, to begin heparin.  Heparin level came back supratherapeutic at 1.02, on 1000 units/hr. No s/sx of bleeding. Heparin is infusing in L AC and level was drawn from opposite arm. Hgb 13.2, plt  226.  Goal of Therapy:  Heparin level 0.3-0.7 units/ml Monitor platelets by anticoagulation protocol: Yes   Plan:  Hold heparin infusion for 1 hour Plan to go to cath  Follow-up after cath for need of anticoagulation.  Antonietta Jewel, PharmD, [place] Clinical Pharmacist  Pager: [phone] Phone: [phone] 03/29/2018,9:03 AM

## 2018-03-29 NOTE — Progress Notes (Signed)
Pt's chest pain went up to a level of 6 out of 10. Three nitro tabs were given as ordered but did not give pt any relief.  The on call cardiologist was called and ordered a one time IV morphine 2 mg. After the medicine was given pt started to feel relief.  Will continue to monitor pt.  Lupita Dawn, RN

## 2018-03-30 ENCOUNTER — Ambulatory Visit (HOSPITAL_COMMUNITY): Admit: 2018-03-30 | Payer: Medicare Other | Admitting: Cardiology

## 2018-03-30 ENCOUNTER — Ambulatory Visit (INDEPENDENT_AMBULATORY_CARE_PROVIDER_SITE_OTHER): Payer: Medicare Other | Admitting: Physician Assistant

## 2018-03-30 ENCOUNTER — Encounter (HOSPITAL_COMMUNITY): Payer: Self-pay | Admitting: Internal Medicine

## 2018-03-30 VITALS — BP 124/70 | HR 70 | Ht 65.0 in | Wt 181.0 lb

## 2018-03-30 DIAGNOSIS — I200 Unstable angina: Secondary | ICD-10-CM | POA: Diagnosis not present

## 2018-03-30 DIAGNOSIS — I061 Rheumatic aortic insufficiency: Secondary | ICD-10-CM | POA: Diagnosis not present

## 2018-03-30 DIAGNOSIS — E785 Hyperlipidemia, unspecified: Secondary | ICD-10-CM | POA: Diagnosis not present

## 2018-03-30 DIAGNOSIS — I2510 Atherosclerotic heart disease of native coronary artery without angina pectoris: Secondary | ICD-10-CM | POA: Diagnosis not present

## 2018-03-30 DIAGNOSIS — I10 Essential (primary) hypertension: Secondary | ICD-10-CM | POA: Diagnosis not present

## 2018-03-30 NOTE — Progress Notes (Signed)
Cardiology Office Note    Date:  03/30/2018   ID:  Anne Martin, DOB 05-Jun-1945, MRN [id number]  PCP:  Laurey Morale, MD  Cardiologist:  Dr. Stanford Breed  Chief Complaint  Patient presents with  . Follow-up    seen for Dr. Stanford Breed.     History of Present Illness:  Anne Martin is a 72 y.o. female with PMH of rheumatic fever, aortic regurgitation, HTN, HLD and CAD.  Patient had a history of DES to diagonal in 2011.  Myoview in July 2018 was normal with EF of 77%.  Patient was recently seen by Rosaria Ferries, PA-C on 03/27/2017 for episodic chest pain concerning for unstable angina.  A Myoview was performed on 03/28/2018 which showed 71% EF, upsloping ST segment depression of 2 mm noted in inferolateral leads, moderate area of moderate anterior wall ischemia from apex to the base, TID was positive at 1.67.  Given the abnormal Myoview finding, she was referred for cardiac catheterization.  Plavix was added to her medical regimen.  Patient presents today for cardiology office visit.  She has been doing well since the cardiac catheterization and has not had any further chest discomfort.  She denies any shortness of breath and that there has been no lower extremity edema, orthopnea or PND.    Past Medical History:  Diagnosis Date  . Allergy    seasonal  . Anemia    prior to hysterectomy, fibroids  . Aortic insufficiency    a. 10/2015 Echo: mild to moderate AI.  Marland Kitchen Aortic stenosis    a. 10/2015 Echo: moderate AS.  Marland Kitchen Blood transfusion without reported diagnosis    with hysterectomy  . CAD (coronary artery disease)    a. 08/2009 s/p cath after abnormal nuclear study; LM nl, LAD 60%, D2 90% (PCI/DES), LCX 50 -60%, RCA nl, nl EF;  b. 2012 MV: no ischemia/infarct;  c. 08/2015 MV: no ischemia/infarct, EF 70%.  . Diastolic dysfunction    a. 10/2015 Echo: EF 55-60%, no rwma, Gr1 DD, mod AS, mild to mod AI.  Marland Kitchen Essential hypertension   . H/O: rheumatic fever   . History of colonoscopy   .  Hyperlipidemia   . Osteopenia    last DEXA 11-22-13     Past Surgical History:  Procedure Laterality Date  . ANGIOPLASTY  may 2011   stents  . AORTIC ARCH ANGIOGRAPHY N/A 03/29/2018   Procedure: AORTIC ARCH ANGIOGRAPHY;  Surgeon: Nelva Bush, MD;  Location: [facility];  Service: Cardiovascular;  Laterality: N/A;  . CHOLECYSTECTOMY  nov 2004  . COLONOSCOPY  02/02/2016   per Dr. Henrene Pastor, adenomatous polyps, repeat in 5 yrs   . INTRAVASCULAR PRESSURE WIRE/FFR STUDY N/A 03/29/2018   Procedure: INTRAVASCULAR PRESSURE WIRE/FFR STUDY;  Surgeon: Nelva Bush, MD;  Location: [place] [facility];  Service: Cardiovascular;  Laterality: N/A;  . KNEE ARTHROSCOPY     Rt. Knee  . LEFT HEART CATH AND CORONARY ANGIOGRAPHY N/A 03/29/2018   Procedure: LEFT HEART CATH AND CORONARY ANGIOGRAPHY;  Surgeon: Nelva Bush, MD;  Location: [place] [facility];  Service: Cardiovascular;  Laterality: N/A;  . TONSILLECTOMY    . VAGINAL HYSTERECTOMY  1994    Current Medications: Outpatient Medications Prior to Visit  Medication Sig Dispense Refill  . aspirin 81 MG tablet Take 81 mg by mouth daily.      Marland Kitchen atorvastatin (LIPITOR) 80 MG tablet Take 1 tablet (80 mg total) by mouth daily at 6 PM. 90 tablet 3  .  Calcium Carbonate (CALCIUM 600) 1500 MG TABS Take 600 mg of elemental calcium by mouth daily.     . cholecalciferol (VITAMIN D) 1000 UNITS tablet Take 1,000 Units by mouth every evening.     . clopidogrel (PLAVIX) 75 MG tablet Take 1 tablet (75 mg total) by mouth daily. 30 tablet 11  . isosorbide mononitrate (IMDUR) 30 MG 24 hr tablet Take 1 tablet (30 mg total) by mouth daily. 90 tablet 3  . Loratadine (CLARITIN) 10 MG CAPS Take 10 mg by mouth daily. 24 hr tabs    . metoprolol succinate (TOPROL-XL) 25 MG 24 hr tablet Take 1 tablet (25 mg total) by mouth daily. 90 tablet 3  . Probiotic Product (PROBIOTIC PO) Take 1 tablet by mouth daily.     . vitamin C (ASCORBIC ACID) 500 MG tablet Take 500 mg  by mouth at bedtime.    Merril Abbe 10 MCG TABS vaginal tablet Place 10 mcg vaginally 2 (two) times a week. On Tuesday and Friday  1  . fluticasone (FLONASE) 50 MCG/ACT nasal spray Place 2 sprays into both nostrils daily as needed for allergies. AS NEEDED    . nitroGLYCERIN (NITROSTAT) 0.4 MG SL tablet Place 1 tablet (0.4 mg total) under the tongue every 5 (five) minutes as needed. MAX 3 doses (Patient not taking: Reported on 03/30/2018) 25 tablet 3   No facility-administered medications prior to visit.      Allergies:   Procaine hcl   Social History   Socioeconomic History  . Marital status: Married    Spouse name: Not on file  . Number of children: 2  . Years of education: Not on file  . Highest education level: Not on file  Occupational History  . Occupation: Retired Product manager: RETIRED  Social Needs  . Financial resource strain: Not on file  . Food insecurity:    Worry: Not on file    Inability: Not on file  . Transportation needs:    Medical: Not on file    Non-medical: Not on file  Tobacco Use  . Smoking status: Former Smoker    Types: Cigarettes    Last attempt to quit: 04/12/1977    Years since quitting: 40.9  . Smokeless tobacco: Never Used  . Tobacco comment: quit age 71  Substance and Sexual Activity  . Alcohol use: Yes    Alcohol/week: 0.0 standard drinks    Comment: rarely  . Drug use: No  . Sexual activity: Not Currently  Lifestyle  . Physical activity:    Days per week: Not on file    Minutes per session: Not on file  . Stress: Not on file  Relationships  . Social connections:    Talks on phone: Not on file    Gets together: Not on file    Attends religious service: Not on file    Active member of club or organization: Not on file    Attends meetings of clubs or organizations: Not on file    Relationship status: Not on file  Other Topics Concern  . Not on file  Social History Narrative  . Not on file     Family History:  The patient's  family history includes Heart disease in her brother, father, and mother; Ulcerative colitis in her daughter.   ROS:   Please see the history of present illness.    ROS All other systems reviewed and are negative.   PHYSICAL EXAM:   VS:  BP 124/70  Pulse 70   Ht 5\' 5"  (1.651 m)   Wt 181 lb (82.1 kg)   LMP  (LMP Unknown)   BMI 30.12 kg/m    GEN: Well nourished, well developed, in no acute distress  HEENT: normal  Neck: no JVD, carotid bruits, or masses Cardiac: RRR; no murmurs, rubs, or gallops,no edema  Respiratory:  clear to auscultation bilaterally, normal work of breathing GI: soft, nontender, nondistended, + BS MS: no deformity or atrophy  Skin: warm and dry, no rash Neuro:  Alert and Oriented x 3, Strength and sensation are intact Psych: euthymic mood, full affect  Wt Readings from Last 3 Encounters:  03/30/18 181 lb (82.1 kg)  03/29/18 180 lb 12.4 oz (82 kg)  03/28/18 180 lb (81.6 kg)      Studies/Labs Reviewed:   EKG:  EKG is not ordered today.   Recent Labs: 01/05/2018: ALT 23; TSH 2.45 03/29/2018: BUN 9; Creatinine, Ser 0.88; Hemoglobin 13.9; Platelets 226; Potassium 4.2; Sodium 142   Lipid Panel    Component Value Date/Time   CHOL 111 03/29/2018 0218   TRIG 20 03/29/2018 0218   HDL 55 03/29/2018 0218   CHOLHDL 2.0 03/29/2018 0218   VLDL 4 03/29/2018 0218   LDLCALC 52 03/29/2018 0218    Additional studies/ records that were reviewed today include:   Echo 11/17/2017 LV EF: 60% -   65% Study Conclusions  - Left ventricle: The cavity size was normal. Systolic function was   normal. The estimated ejection fraction was in the range of 60%   to 65%. Wall motion was normal; there were no regional wall   motion abnormalities. Doppler parameters are consistent with   abnormal left ventricular relaxation (grade 1 diastolic   dysfunction). Doppler parameters are consistent with high   ventricular filling pressure. - Aortic valve: There was mild stenosis.  There was moderate   regurgitation. Peak velocity (S): 224 cm/s. Mean gradient (S): 10   mm Hg. Valve area (VTI): 1.63 cm^2. Valve area (Vmax): 1.72 cm^2.   Valve area (Vmean): 1.6 cm^2. - Mitral valve: Transvalvular velocity was within the normal range.   There was no evidence for stenosis. There was trivial   regurgitation. - Right ventricle: The cavity size was normal. Wall thickness was   normal. Systolic function was normal. - Atrial septum: No defect or patent foramen ovale was identified. - Tricuspid valve: There was trivial regurgitation. - Pulmonary arteries: Systolic pressure was within the normal   range. PA peak pressure: 29 mm Hg (S).    Myoview 03/28/2018 Study Highlights     The left ventricular ejection fraction is hyperdynamic (>65%).  Nuclear stress EF: 71%.  Upsloping ST segment depression ST segment depression of 2 mm was noted during stress in the II, III, aVF, V5 and V6 leads.  Findings consistent with ischemia.  This is a high risk study.   Moderate area of moderate anterior wall ischemia from apex to base. TID positive at 1.67 Positive ECG response to exercise. EF estimated at 71%     Cath 03/29/2018 Conclusions: 1. Non-obstructive coronary artery disease with moderate mid LAD disease (DFR 0.93) and moderate D2 in-stent restenosis (DFR 0.93).  No significant disease involving the LCx and RCA. 2. Normal left ventricular systolic function and left ventricular filling pressure. 3. Mild aortic stenosis and regurgitation. 4. Mildly dilated ascending aorta.  Recommendations: 1. Medical therapy and secondary prevention of coronary artery disease. 2. Consider cross-sectional imaging of aorta.   ASSESSMENT:  1. Coronary artery disease involving native coronary artery of native heart without angina pectoris   2. Essential hypertension   3. Hyperlipidemia LDL goal <70   4. Rheumatic aortic valve insufficiency      PLAN:  In order of problems  listed above:  5. CAD: Denies any recent chest discomfort or shortness of breath after the cardiac catheterization.  Cardiac catheterization showed mild to moderate disease in mid LAD and second diagonal in-stent restenosis.  FFR was normal.  Continue aspirin and Plavix.  Plavix was recently added in the hospital  6. Hypertension: Blood pressure stable on current therapy  7. Hyperlipidemia: Continue Lipitor 80 mg daily.  Lipid panel obtained yesterday showed very well-controlled cholesterol and LDL  8. Rheumatic aortic valve disease: History of moderate aortic stenosis and moderate aortic regurgitation on previous echocardiogram in 2018, repeat echocardiogram this year showed mild aortic stenosis with moderate aortic regurgitation.  Will defer follow-up echo to Dr. Stanford Breed.    Medication Adjustments/Labs and Tests Ordered: Current medicines are reviewed at length with the patient today.  Concerns regarding medicines are outlined above.  Medication changes, Labs and Tests ordered today are listed in the Patient Instructions below. Patient Instructions  Medication Instructions:  Almyra Deforest, PA recommends that you continue on your current medications as directed. Please refer to the Current Medication list given to you today.  If you need a refill on your cardiac medications before your next appointment, please call your pharmacy.   Follow-Up: At [facility], you and your health needs are our priority.  As part of our continuing mission to provide you with exceptional heart care, we have created designated Provider Care Teams.  These Care Teams include your primary Cardiologist (physician) and Advanced Practice Providers (APPs -  Physician Assistants and Nurse Practitioners) who all work together to provide you with the care you need, when you need it. You will need a follow up appointment in 4 months.  Please call our office 2 months in advance to schedule this appointment.  You may see Kirk Ruths, MD or one of the following Advanced Practice Providers on your designated Care Team:   Kerin Ransom, PA-C Roby Lofts, [place] . Sande Rives, PA-C    Signed, [place], [place]  03/30/2018 2:07 PM    [facility] [address], [place], [place]  [address] Phone: [phone]; Fax: [phone]

## 2018-03-30 NOTE — Patient Instructions (Signed)
Medication Instructions:  Anne Deforest, PA recommends that you continue on your current medications as directed. Please refer to the Current Medication list given to you today.  If you need a refill on your cardiac medications before your next appointment, please call your pharmacy.   Follow-Up: At [facility], you and your health needs are our priority.  As part of our continuing mission to provide you with exceptional heart care, we have created designated Provider Care Teams.  These Care Teams include your primary Cardiologist (physician) and Advanced Practice Providers (APPs -  Physician Assistants and Nurse Practitioners) who all work together to provide you with the care you need, when you need it. You will need a follow up appointment in 4 months.  Please call our office 2 months in advance to schedule this appointment.  You may see Kirk Ruths, MD or one of the following Advanced Practice Providers on your designated Care Team:   Kerin Ransom, PA-C Roby Lofts, [place] . Sande Rives, PA-C

## 2018-04-03 ENCOUNTER — Telehealth: Payer: Self-pay | Admitting: Cardiology

## 2018-04-03 NOTE — Telephone Encounter (Signed)
Spoke with pt, she has a tingling type sensation in the bicep. She has no swelling and the pulse in her wrist is strong. She has some bruising but otherwise doing well. She will continue to watch and call back for any changes.

## 2018-04-03 NOTE — Telephone Encounter (Signed)
New message  Patient states that she had a cath 03/29/2018, patient states that she is  Having some issues with her right arm. Please call to discuss.

## 2018-04-13 ENCOUNTER — Ambulatory Visit: Payer: Medicare Other | Admitting: Physician Assistant

## 2018-05-17 DIAGNOSIS — R010 Benign and innocent cardiac murmurs: Secondary | ICD-10-CM | POA: Diagnosis not present

## 2018-05-17 DIAGNOSIS — R42 Dizziness and giddiness: Secondary | ICD-10-CM | POA: Diagnosis not present

## 2018-05-19 DIAGNOSIS — R011 Cardiac murmur, unspecified: Secondary | ICD-10-CM | POA: Diagnosis not present

## 2018-05-19 DIAGNOSIS — R42 Dizziness and giddiness: Secondary | ICD-10-CM | POA: Diagnosis not present

## 2018-05-23 DIAGNOSIS — R05 Cough: Secondary | ICD-10-CM | POA: Diagnosis not present

## 2018-07-26 ENCOUNTER — Other Ambulatory Visit: Payer: Medicare Other

## 2018-08-02 NOTE — Progress Notes (Signed)
Virtual Visit via Video Note changed to phone visit per patient request due to poor reception   This visit type was conducted due to national recommendations for restrictions regarding the COVID-19 Pandemic (e.g. social distancing) in an effort to limit this patient's exposure and mitigate transmission in our community.  Due to her co-morbid illnesses, this patient is at least at moderate risk for complications without adequate follow up.  This format is felt to be most appropriate for this patient at this time.  All issues noted in this document were discussed and addressed.  A limited physical exam was performed with this format.  Please refer to the patient's chart for her consent to telehealth for [facility].   Evaluation Performed:  Follow-up visit  Date:  08/10/2018   ID:  Anne Martin, Anne Martin 1946-02-16, MRN [id number]  Patient Location: Home Provider Location: Home  PCP:  Laurey Morale, MD  Cardiologist:  Kirk Ruths, MD   Chief Complaint:  FU CAD  History of Present Illness:    FU CAD. Cardiac catheterization was performed in May of 2011. This revealed Normal LM, 60 LAD, 90 D2, 50-60 LCx, normal RCA, and nl LV function. Patient had PCI of the diagonal with DES at that time. Carotid Dopplers in Oct 2013 were normal.Last echocardiogram performed August 2019 showed normal LV function, mild diastolic dysfunction, mild aortic stenosis with mean gradient 10 mmHg, moderate aortic insufficiency. Cardiac catheterization December 2019 showed nonobstructive coronary disease with moderate mid LAD and moderate in-stent restenosis in second diagonal and no disease in the circumflex or right coronary artery.  LV function was normal with normal LV filling pressures.  The ascending aorta was mildly dilated.  Since I last saw her,she has occasional chest pain with climbing hills.  However she states it can last all day at times.  There is dyspnea with more vigorous activities which is unchanged.   No syncope.  The patient does not have symptoms concerning for COVID-19 infection (fever, chills, cough, or new shortness of breath).    Past Medical History:  Diagnosis Date  . Allergy    seasonal  . Anemia    prior to hysterectomy, fibroids  . Aortic insufficiency    a. 10/2015 Echo: mild to moderate AI.  Marland Kitchen Aortic stenosis    a. 10/2015 Echo: moderate AS.  Marland Kitchen Blood transfusion without reported diagnosis    with hysterectomy  . CAD (coronary artery disease)    a. 08/2009 s/p cath after abnormal nuclear study; LM nl, LAD 60%, D2 90% (PCI/DES), LCX 50 -60%, RCA nl, nl EF;  b. 2012 MV: no ischemia/infarct;  c. 08/2015 MV: no ischemia/infarct, EF 70%.  . Diastolic dysfunction    a. 10/2015 Echo: EF 55-60%, no rwma, Gr1 DD, mod AS, mild to mod AI.  Marland Kitchen Essential hypertension   . H/O: rheumatic fever   . History of colonoscopy   . Hyperlipidemia   . Osteopenia    last DEXA 11-22-13    Past Surgical History:  Procedure Laterality Date  . ANGIOPLASTY  may 2011   stents  . AORTIC ARCH ANGIOGRAPHY N/A 03/29/2018   Procedure: AORTIC ARCH ANGIOGRAPHY;  Surgeon: Nelva Bush, MD;  Location: [facility] [facility];  Service: Cardiovascular;  Laterality: N/A;  . CHOLECYSTECTOMY  nov 2004  . COLONOSCOPY  02/02/2016   per Dr. Henrene Pastor, adenomatous polyps, repeat in 5 yrs   . INTRAVASCULAR PRESSURE WIRE/FFR STUDY N/A 03/29/2018   Procedure: INTRAVASCULAR PRESSURE WIRE/FFR STUDY;  Surgeon: End,  Harrell Gave, MD;  Location: [place] [facility];  Service: Cardiovascular;  Laterality: N/A;  . KNEE ARTHROSCOPY     Rt. Knee  . LEFT HEART CATH AND CORONARY ANGIOGRAPHY N/A 03/29/2018   Procedure: LEFT HEART CATH AND CORONARY ANGIOGRAPHY;  Surgeon: Nelva Bush, MD;  Location: [facility];  Service: Cardiovascular;  Laterality: N/A;  . TONSILLECTOMY    . VAGINAL HYSTERECTOMY  1994     Current Meds  Medication Sig  . aspirin 81 MG tablet Take 81 mg by mouth daily.    Marland Kitchen atorvastatin (LIPITOR) 80  MG tablet Take 1 tablet (80 mg total) by mouth daily at 6 PM.  . Calcium Carbonate (CALCIUM 600) 1500 MG TABS Take 600 mg of elemental calcium by mouth daily.   . cholecalciferol (VITAMIN D) 1000 UNITS tablet Take 1,000 Units by mouth every evening.   . clopidogrel (PLAVIX) 75 MG tablet Take 1 tablet (75 mg total) by mouth daily.  . fluticasone (FLONASE) 50 MCG/ACT nasal spray Place 2 sprays into both nostrils daily as needed for allergies. AS NEEDED  . isosorbide mononitrate (IMDUR) 30 MG 24 hr tablet Take 1 tablet (30 mg total) by mouth daily.  . Loratadine (CLARITIN) 10 MG CAPS Take 10 mg by mouth daily. 24 hr tabs  . metoprolol succinate (TOPROL-XL) 25 MG 24 hr tablet Take 1 tablet (25 mg total) by mouth daily.  . nitroGLYCERIN (NITROSTAT) 0.4 MG SL tablet Place 1 tablet (0.4 mg total) under the tongue every 5 (five) minutes as needed. MAX 3 doses  . Probiotic Product (PROBIOTIC PO) Take 1 tablet by mouth daily.   . vitamin C (ASCORBIC ACID) 500 MG tablet Take 500 mg by mouth at bedtime.  Merril Abbe 10 MCG TABS vaginal tablet Place 10 mcg vaginally 2 (two) times a week. On Tuesday and Friday     Allergies:   Procaine hcl   Social History   Tobacco Use  . Smoking status: Former Smoker    Types: Cigarettes    Last attempt to quit: 04/12/1977    Years since quitting: 41.3  . Smokeless tobacco: Never Used  . Tobacco comment: quit age 65  Substance Use Topics  . Alcohol use: Yes    Alcohol/week: 0.0 standard drinks    Comment: rarely  . Drug use: No     Family Hx: The patient's family history includes Heart disease in her brother, father, and mother; Ulcerative colitis in her daughter. There is no history of Colon cancer.  ROS:   Please see the history of present illness.    No fevers, chills or productive cough. All other systems reviewed and are negative.   Recent Labs: 01/05/2018: ALT 23; TSH 2.45 03/29/2018: BUN 9; Creatinine, Ser 0.88; Hemoglobin 13.9; Platelets 226;  Potassium 4.2; Sodium 142   Recent Lipid Panel Lab Results  Component Value Date/Time   CHOL 111 03/29/2018 02:18 AM   TRIG 20 03/29/2018 02:18 AM   HDL 55 03/29/2018 02:18 AM   CHOLHDL 2.0 03/29/2018 02:18 AM   LDLCALC 52 03/29/2018 02:18 AM    Wt Readings from Last 3 Encounters:  08/10/18 180 lb (81.6 kg)  03/30/18 181 lb (82.1 kg)  03/29/18 180 lb 12.4 oz (82 kg)     Objective:    Vital Signs:  Ht 5\' 6"  (1.676 m)   Wt 180 lb (81.6 kg)   LMP  (LMP Unknown)   BMI 29.05 kg/m    VITAL SIGNS:  reviewed  Normal affect Answers  questions appropriately No acute distress Remainder of physical examination not performed (telehealth visit; coronavirus pandemic)  ASSESSMENT & PLAN:    1. Coronary artery disease-recent catheterization did not reveal obstructive disease with only moderate disease in the LAD and diagonal.  Plan to continue medical therapy with aspirin and statin.  Discontinue Plavix. 2. Hypertension-patient's blood pressure is controlled.  Continue present medications and follow. 3. Hyperlipidemia-continue statin.  Check lipids and liver. 4. Aortic stenosis/aortic insufficiency-plan follow-up echocardiogram August 2020. 5. Mildly dilated aortic root-noted at time of cardiac catheterization.  I will plan CTA of thoracic aorta August 2020 as well.  COVID-19 Education: The importance of social distancing was discussed today.  Time:   Today, I have spent 15 minutes with the patient with telehealth technology discussing the above problems.     Medication Adjustments/Labs and Tests Ordered: Current medicines are reviewed at length with the patient today.  Concerns regarding medicines are outlined above.   Tests Ordered: No orders of the defined types were placed in this encounter.   Medication Changes: No orders of the defined types were placed in this encounter.   Disposition:  Follow up in 4 month(s)  Signed, Kirk Ruths, MD  08/10/2018 1:12 PM    [facility]

## 2018-08-10 ENCOUNTER — Encounter: Payer: Self-pay | Admitting: Cardiology

## 2018-08-10 ENCOUNTER — Telehealth (INDEPENDENT_AMBULATORY_CARE_PROVIDER_SITE_OTHER): Payer: Medicare Other | Admitting: Cardiology

## 2018-08-10 VITALS — Ht 66.0 in | Wt 180.0 lb

## 2018-08-10 DIAGNOSIS — I350 Nonrheumatic aortic (valve) stenosis: Secondary | ICD-10-CM

## 2018-08-10 DIAGNOSIS — I2510 Atherosclerotic heart disease of native coronary artery without angina pectoris: Secondary | ICD-10-CM | POA: Diagnosis not present

## 2018-08-10 DIAGNOSIS — E7800 Pure hypercholesterolemia, unspecified: Secondary | ICD-10-CM

## 2018-08-10 DIAGNOSIS — I10 Essential (primary) hypertension: Secondary | ICD-10-CM

## 2018-08-10 NOTE — Patient Instructions (Signed)
Medication Instructions:  STOP PLAVIX If you need a refill on your cardiac medications before your next appointment, please call your pharmacy.   Lab work: Your physician recommends that you return for lab work in: [place] If you have labs (blood work) drawn today and your tests are completely normal, you will receive your results only by: Marland Kitchen MyChart Message (if you have MyChart) OR . A paper copy in the mail If you have any lab test that is abnormal or we need to change your treatment, we will call you to review the results.  Testing/Procedures: Your physician has requested that you have an echocardiogram. Echocardiography is a painless test that uses sound waves to create images of your heart. It provides your doctor with information about the size and shape of your heart and how well your heart's chambers and valves are working. This procedure takes approximately one hour. There are no restrictions for this procedure.  SCHEDULE IN AUGUST  CTA OF THE CHEST TO FOLLOW UPON THORACIC ANEURYSM IN AUGUST  Follow-Up: At [facility], you and your health needs are our priority.  As part of our continuing mission to provide you with exceptional heart care, we have created designated Provider Care Teams.  These Care Teams include your primary Cardiologist (physician) and Advanced Practice Providers (APPs -  Physician Assistants and Nurse Practitioners) who all work together to provide you with the care you need, when you need it. You will need a follow up appointment in 4 months.  Please call our office 2 months in advance to schedule this appointment.  You may see Kirk Ruths, MD or one of the following Advanced Practice Providers on your designated Care Team:   Kerin Ransom, PA-C Roby Lofts, [place] . Sande Rives, PA-C

## 2018-08-22 ENCOUNTER — Telehealth: Payer: Self-pay | Admitting: Cardiology

## 2018-08-22 NOTE — Telephone Encounter (Signed)
Left message for pt to call.

## 2018-08-22 NOTE — Telephone Encounter (Signed)
New Message:     Pt says she needs to talk to you please. She says she have some questions about her procedure in August and some other questions.

## 2018-08-24 NOTE — Telephone Encounter (Signed)
Spoke with pt, questions regarding thoracic aneurysm answered.

## 2018-08-24 NOTE — Telephone Encounter (Signed)
Follow Up:; ° ° °Returning your call. °

## 2018-08-30 ENCOUNTER — Other Ambulatory Visit: Payer: Self-pay | Admitting: *Deleted

## 2018-08-30 DIAGNOSIS — I712 Thoracic aortic aneurysm, without rupture, unspecified: Secondary | ICD-10-CM

## 2018-09-19 DIAGNOSIS — L821 Other seborrheic keratosis: Secondary | ICD-10-CM | POA: Diagnosis not present

## 2018-09-19 DIAGNOSIS — D2260 Melanocytic nevi of unspecified upper limb, including shoulder: Secondary | ICD-10-CM | POA: Diagnosis not present

## 2018-09-19 DIAGNOSIS — D2270 Melanocytic nevi of unspecified lower limb, including hip: Secondary | ICD-10-CM | POA: Diagnosis not present

## 2018-09-19 DIAGNOSIS — L814 Other melanin hyperpigmentation: Secondary | ICD-10-CM | POA: Diagnosis not present

## 2018-09-19 DIAGNOSIS — D225 Melanocytic nevi of trunk: Secondary | ICD-10-CM | POA: Diagnosis not present

## 2018-09-25 DIAGNOSIS — Z20828 Contact with and (suspected) exposure to other viral communicable diseases: Secondary | ICD-10-CM | POA: Diagnosis not present

## 2018-10-03 ENCOUNTER — Other Ambulatory Visit: Payer: Medicare Other

## 2018-11-21 ENCOUNTER — Telehealth: Payer: Self-pay | Admitting: Cardiology

## 2018-11-21 NOTE — Telephone Encounter (Signed)
  Patient wants to know what [facility] she should go to to get her lab work done and how far ahead of her visit should she go

## 2018-11-21 NOTE — Telephone Encounter (Signed)
Returned call to patient. Explained that she has an order for a CMET & lipid panel to be completed prior to 11/28/18 CT test. She was advised she can come to our office for the fasting lab work.

## 2018-11-22 DIAGNOSIS — E7800 Pure hypercholesterolemia, unspecified: Secondary | ICD-10-CM | POA: Diagnosis not present

## 2018-11-22 DIAGNOSIS — I10 Essential (primary) hypertension: Secondary | ICD-10-CM | POA: Diagnosis not present

## 2018-11-22 LAB — COMPREHENSIVE METABOLIC PANEL
ALT: 34 IU/L — ABNORMAL HIGH (ref 0–32)
AST: 43 IU/L — ABNORMAL HIGH (ref 0–40)
Albumin/Globulin Ratio: 2.6 — ABNORMAL HIGH (ref 1.2–2.2)
Albumin: 4.7 g/dL (ref 3.7–4.7)
Alkaline Phosphatase: 131 IU/L — ABNORMAL HIGH (ref 39–117)
BUN/Creatinine Ratio: 17 (ref 12–28)
BUN: 14 mg/dL (ref 8–27)
Bilirubin Total: 0.8 mg/dL (ref 0.0–1.2)
CO2: 24 mmol/L (ref 20–29)
Calcium: 9.8 mg/dL (ref 8.7–10.3)
Chloride: 101 mmol/L (ref 96–106)
Creatinine, Ser: 0.83 mg/dL (ref 0.57–1.00)
GFR calc Af Amer: 81 mL/min/{1.73_m2} (ref >59)
GFR calc non Af Amer: 70 mL/min/{1.73_m2} (ref >59)
Globulin, Total: 1.8 g/dL (ref 1.5–4.5)
Glucose: 83 mg/dL (ref 65–99)
Potassium: 4.4 mmol/L (ref 3.5–5.2)
Sodium: 142 mmol/L (ref 134–144)
Total Protein: 6.5 g/dL (ref 6.0–8.5)

## 2018-11-22 LAB — LIPID PANEL
Chol/HDL Ratio: 1.7 ratio (ref 0.0–4.4)
Cholesterol, Total: 106 mg/dL (ref 100–199)
HDL: 62 mg/dL (ref >39)
LDL Calculated: 32 mg/dL (ref 0–99)
Triglycerides: 58 mg/dL (ref 0–149)
VLDL Cholesterol Cal: 12 mg/dL (ref 5–40)

## 2018-11-23 ENCOUNTER — Telehealth: Payer: Self-pay | Admitting: *Deleted

## 2018-11-23 DIAGNOSIS — E785 Hyperlipidemia, unspecified: Secondary | ICD-10-CM

## 2018-11-23 NOTE — Telephone Encounter (Signed)
Results released to my chart  Lab orders mailed to the pt

## 2018-11-23 NOTE — Telephone Encounter (Signed)
-----   Message from Lelon Perla, MD sent at 11/22/2018  6:18 PM EDT ----- No change in meds; repeat LFTs 12 weeks  Kirk Ruths

## 2018-11-27 NOTE — Progress Notes (Signed)
HPI: FU CAD. Cardiac catheterization was performed in May of 2011. This revealed Normal LM, 60 LAD, 90 D2, 50-60 LCx, normal RCA, and nl LV function. Patient had PCI of the diagonal with DES at that time. Carotid Dopplers in Oct 2013 were normal.Cardiac catheterization December 2019 showed nonobstructive coronary disease with moderate mid LAD and moderate in-stent restenosis in second diagonal and no disease in the circumflex or right coronary artery.  LV function was normal with normal LV filling pressures.  The ascending aorta was mildly dilated. Echocardiogram August 2020 showed normal LV function, mild aortic insufficiency, mild aortic stenosis with mean gradient 12 mmHg.  CTA August 2020 showed no thoracic aortic aneurysm.  There were small nodules in the left lower lobe and follow-up recommended 1 year.  Since I last saw her,the patient denies any dyspnea on exertion, orthopnea, PND, pedal edema, palpitations, syncope or chest pain.   Current Outpatient Medications  Medication Sig Dispense Refill  . aspirin 81 MG tablet Take 81 mg by mouth daily.      Marland Kitchen atorvastatin (LIPITOR) 80 MG tablet Take 1 tablet (80 mg total) by mouth daily at 6 PM. 90 tablet 3  . Calcium Carbonate (CALCIUM 600) 1500 MG TABS Take 600 mg of elemental calcium by mouth daily.     . cholecalciferol (VITAMIN D) 1000 UNITS tablet Take 1,000 Units by mouth every evening.     . fluticasone (FLONASE) 50 MCG/ACT nasal spray Place 2 sprays into both nostrils daily as needed for allergies. AS NEEDED    . isosorbide mononitrate (IMDUR) 30 MG 24 hr tablet Take 1 tablet (30 mg total) by mouth daily. 90 tablet 3  . Loratadine (CLARITIN) 10 MG CAPS Take 10 mg by mouth daily. 24 hr tabs    . metoprolol succinate (TOPROL-XL) 25 MG 24 hr tablet Take 1 tablet (25 mg total) by mouth daily. 90 tablet 3  . nitroGLYCERIN (NITROSTAT) 0.4 MG SL tablet Place 1 tablet (0.4 mg total) under the tongue every 5 (five) minutes as needed. MAX 3  doses 25 tablet 3  . Probiotic Product (PROBIOTIC PO) Take 1 tablet by mouth daily.     . vitamin C (ASCORBIC ACID) 500 MG tablet Take 500 mg by mouth at bedtime.    Merril Abbe 10 MCG TABS vaginal tablet Place 10 mcg vaginally 2 (two) times a week. On Tuesday and Friday  1   No current facility-administered medications for this visit.      Past Medical History:  Diagnosis Date  . Allergy    seasonal  . Anemia    prior to hysterectomy, fibroids  . Aortic insufficiency    a. 10/2015 Echo: mild to moderate AI.  Marland Kitchen Aortic stenosis    a. 10/2015 Echo: moderate AS.  Marland Kitchen Blood transfusion without reported diagnosis    with hysterectomy  . CAD (coronary artery disease)    a. 08/2009 s/p cath after abnormal nuclear study; LM nl, LAD 60%, D2 90% (PCI/DES), LCX 50 -60%, RCA nl, nl EF;  b. 2012 MV: no ischemia/infarct;  c. 08/2015 MV: no ischemia/infarct, EF 70%.  . Diastolic dysfunction    a. 10/2015 Echo: EF 55-60%, no rwma, Gr1 DD, mod AS, mild to mod AI.  Marland Kitchen Essential hypertension   . H/O: rheumatic fever   . History of colonoscopy   . Hyperlipidemia   . Osteopenia    last DEXA 11-22-13     Past Surgical History:  Procedure Laterality Date  .  ANGIOPLASTY  may 2011   stents  . AORTIC ARCH ANGIOGRAPHY N/A 03/29/2018   Procedure: AORTIC ARCH ANGIOGRAPHY;  Surgeon: Nelva Bush, MD;  Location: [facility];  Service: Cardiovascular;  Laterality: N/A;  . CHOLECYSTECTOMY  nov 2004  . COLONOSCOPY  02/02/2016   per Dr. Henrene Pastor, adenomatous polyps, repeat in 5 yrs   . INTRAVASCULAR PRESSURE WIRE/FFR STUDY N/A 03/29/2018   Procedure: INTRAVASCULAR PRESSURE WIRE/FFR STUDY;  Surgeon: Nelva Bush, MD;  Location: [facility];  Service: Cardiovascular;  Laterality: N/A;  . KNEE ARTHROSCOPY     Rt. Knee  . LEFT HEART CATH AND CORONARY ANGIOGRAPHY N/A 03/29/2018   Procedure: LEFT HEART CATH AND CORONARY ANGIOGRAPHY;  Surgeon: Nelva Bush, MD;  Location: [facility];   Service: Cardiovascular;  Laterality: N/A;  . TONSILLECTOMY    . VAGINAL HYSTERECTOMY  1994    Social History   Socioeconomic History  . Marital status: Married    Spouse name: Not on file  . Number of children: 2  . Years of education: Not on file  . Highest education level: Not on file  Occupational History  . Occupation: Retired Product manager: RETIRED  Social Needs  . Financial resource strain: Not on file  . Food insecurity    Worry: Not on file    Inability: Not on file  . Transportation needs    Medical: Not on file    Non-medical: Not on file  Tobacco Use  . Smoking status: Former Smoker    Types: Cigarettes    Quit date: 04/12/1977    Years since quitting: 41.6  . Smokeless tobacco: Never Used  . Tobacco comment: quit age 8  Substance and Sexual Activity  . Alcohol use: Yes    Alcohol/week: 0.0 standard drinks    Comment: rarely  . Drug use: No  . Sexual activity: Not Currently  Lifestyle  . Physical activity    Days per week: Not on file    Minutes per session: Not on file  . Stress: Not on file  Relationships  . Social Herbalist on phone: Not on file    Gets together: Not on file    Attends religious service: Not on file    Active member of club or organization: Not on file    Attends meetings of clubs or organizations: Not on file    Relationship status: Not on file  . Intimate partner violence    Fear of current or ex partner: Not on file    Emotionally abused: Not on file    Physically abused: Not on file    Forced sexual activity: Not on file  Other Topics Concern  . Not on file  Social History Narrative  . Not on file    Family History  Problem Relation Age of Onset  . Heart disease Mother   . Heart disease Father        CABG  . Heart disease Brother        2 stents  . Ulcerative colitis Daughter   . Colon cancer Neg Hx     ROS: no fevers or chills, productive cough, hemoptysis, dysphasia, odynophagia, melena,  hematochezia, dysuria, hematuria, rash, seizure activity, orthopnea, PND, pedal edema, claudication. Remaining systems are negative.  Physical Exam: Well-developed well-nourished in no acute distress.  Skin is warm and dry.  HEENT is normal.  Neck is supple.  Chest is clear to auscultation with normal expansion.  Cardiovascular exam is regular rate and rhythm. 2/6 systolic murmur Abdominal exam nontender or distended. No masses palpated. Extremities show no edema. neuro grossly intact  ECG-sinus bradycardia at a rate of 58, no ST changes.  Personally reviewed  A/P  1 CAD-pt denies recurrent CP; continue medical therapy with ASA and statin.  2 HTN-BP controlled; continue present meds and follow.  3 hyperlipidemia-continue statin.  Recheck liver functions 12 weeks.  4 aortic stenosis/aortic insufficiency-mild aortic stenosis and aortic sufficiency on recent echocardiogram.  Will need follow-up studies in the future.  5 history of dilated aortic root-aortic root not dilated on follow-up CT.  6 pulmonary nodules-patient will need noncontrast chest CT August 2021.  Kirk Ruths, MD

## 2018-11-28 ENCOUNTER — Ambulatory Visit: Payer: Self-pay | Admitting: *Deleted

## 2018-11-28 ENCOUNTER — Other Ambulatory Visit: Payer: Self-pay

## 2018-11-28 ENCOUNTER — Ambulatory Visit (HOSPITAL_COMMUNITY): Payer: Medicare Other | Attending: Cardiology

## 2018-11-28 ENCOUNTER — Ambulatory Visit (INDEPENDENT_AMBULATORY_CARE_PROVIDER_SITE_OTHER)
Admission: RE | Admit: 2018-11-28 | Discharge: 2018-11-28 | Disposition: A | Discharge status: Home / Self Care | Payer: Medicare Other | Source: Ambulatory Visit | Attending: Cardiology | Admitting: Cardiology

## 2018-11-28 DIAGNOSIS — I712 Thoracic aortic aneurysm, without rupture, unspecified: Secondary | ICD-10-CM

## 2018-11-28 DIAGNOSIS — I350 Nonrheumatic aortic (valve) stenosis: Secondary | ICD-10-CM | POA: Diagnosis not present

## 2018-11-28 MED ORDER — IOHEXOL 350 MG/ML SOLN
100.0000 mL | Freq: Once | INTRAVENOUS | Status: AC | PRN
  Administered 2018-11-28: 100 mL via INTRAVENOUS

## 2018-11-28 NOTE — Telephone Encounter (Signed)
Message from Anne Martin sent at 11/28/2018 2:22 PM EDT  Summary: constipation   Pt states she is constipated and needs to know what she can do to relieve this.  Pt states that she has tried Dulcolax.  Pt takes a probiotic in the morning and has started taking one a night as well but with no relief.  Pt states that she is watching her weight and is eating healthier and wonders if this is causing it.  Pt tried a fleet enema about a week ago with no relief.  Pt last had a bowel movement on Sunday. Pt went a whole week before she had that bowel movement.  No pain, no pressure, no bloating.           Reason for Disposition . Unable to have a bowel movement (BM) without laxative or enema  Answer Assessment - Initial Assessment Questions 1. STOOL PATTERN OR FREQUENCY: "How often do you pass bowel movements (BMs)?"  (Normal range: tid to q 3 days)  "When was the last BM passed?"       Usually has BM daily, now having BMs only with assistance of laxative. Has had 2 BMs in the past 2 weeks with using Dulcolax. 2. STRAINING: "Do you have to strain to have a BM?"      Strains if she feels like she has to go when she has not used a laxative.   3. RECTAL PAIN: "Does your rectum hurt when the stool comes out?" If so, ask: "Do you have hemorrhoids? How bad is the pain?"  (Scale 1-10; or mild, moderate, severe)     No rectal pain, no history of hemorrhoids  4. STOOL COMPOSITION: "Are the stools hard?"      When taking laxative has been more like diarrhea  5. BLOOD ON STOOLS: "Has there been any blood on the toilet tissue or on the surface of the BM?" If so, ask: "When was the last time?"      Denies blood in BM or tissue 6. CHRONIC CONSTIPATION: "Is this a new problem for you?"  If no, ask: "How long have you had this problem?" (days, weeks, months)      New problem, onset 3 weeks ago 7. CHANGES IN DIET: "Have there been any recent changes in your diet?"      Cutting out carbohydrates - eating  mostly vegetables, fruits, and lean proteins since mid July 8. MEDICATIONS: "Have you been taking any new medications?"     Denies new medications except laxative - added an extra dose of probiotic now takes twice daily. 9. LAXATIVES: "Have you been using any laxatives or enemas?"  If yes, ask "What, how often, and when was the last time?"     Fleet enema once- not effective  and Dulcolax twice in the past 3 weeks.  Last used Dulcolax 11/26/2018 10. CAUSE: "What do you think is causing the constipation?"        Change in diet  11. OTHER SYMPTOMS: "Do you have any other symptoms?" (e.g., abdominal pain, fever, vomiting)       Denies all  12. PREGNANCY: "Is there any chance you are pregnant?" "When was your last menstrual period?"      N/A  Protocols used: CONSTIPATION-A-AH   Patient states she started changing her diet about 1 month ago- cutting out processed carbohydrates and eating mostly fruits, vegetables, and lean proteins.  Also, drinking at least 8 glasses of water and walking for exercise daily.  Until 3 weeks ago her BMs had been regular everyday- takes a probiotic daily which has helped.  For the past 3 weeks she has not been able to have a BM without taking Dulcolax.  She also tried a fleets enema, but this was not effective.  Patient denies abdominal pain, bloating, vomiting, fever, or leaking stool.  Transferred call to PCP office for patient to schedule an appointment within 3 days as she is not able to have a BM without the assistance of laxative.  Patient was scheduled for appointment tomorrow with Dr. Sarajane Jews.

## 2018-11-29 ENCOUNTER — Encounter: Payer: Self-pay | Admitting: Family Medicine

## 2018-11-29 ENCOUNTER — Telehealth (INDEPENDENT_AMBULATORY_CARE_PROVIDER_SITE_OTHER): Payer: Medicare Other | Admitting: Family Medicine

## 2018-11-29 DIAGNOSIS — K5900 Constipation, unspecified: Secondary | ICD-10-CM | POA: Diagnosis not present

## 2018-11-29 DIAGNOSIS — I2510 Atherosclerotic heart disease of native coronary artery without angina pectoris: Secondary | ICD-10-CM

## 2018-11-29 NOTE — Progress Notes (Signed)
Virtual Visit via Video Note  I connected with the patient on 11/29/18 at  1:45 PM EDT by a video enabled telemedicine application and verified that I am speaking with the correct person using two identifiers.  Location patient: home Location provider:work or home office Persons participating in the virtual visit: patient, provider  I discussed the limitations of evaluation and management by telemedicine and the availability of in person appointments. The patient expressed understanding and agreed to proceed.   HPI: Here for constipation. She had been having regular BMs for the past few years by taking probiotics and drinking lots of water, but in the past few months she has started to be constipated. She feels bloated and has very small stools, but there is no nausea or abdominal pain. She has been using Fleet enemas and Dulculux with mixed results.    ROS: See pertinent positives and negatives per HPI.  Past Medical History:  Diagnosis Date  . Allergy    seasonal  . Anemia    prior to hysterectomy, fibroids  . Aortic insufficiency    a. 10/2015 Echo: mild to moderate AI.  Marland Kitchen Aortic stenosis    a. 10/2015 Echo: moderate AS.  Marland Kitchen Blood transfusion without reported diagnosis    with hysterectomy  . CAD (coronary artery disease)    a. 08/2009 s/p cath after abnormal nuclear study; LM nl, LAD 60%, D2 90% (PCI/DES), LCX 50 -60%, RCA nl, nl EF;  b. 2012 MV: no ischemia/infarct;  c. 08/2015 MV: no ischemia/infarct, EF 70%.  . Diastolic dysfunction    a. 10/2015 Echo: EF 55-60%, no rwma, Gr1 DD, mod AS, mild to mod AI.  Marland Kitchen Essential hypertension   . H/O: rheumatic fever   . History of colonoscopy   . Hyperlipidemia   . Osteopenia    last DEXA 11-22-13     Past Surgical History:  Procedure Laterality Date  . ANGIOPLASTY  may 2011   stents  . AORTIC ARCH ANGIOGRAPHY N/A 03/29/2018   Procedure: AORTIC ARCH ANGIOGRAPHY;  Surgeon: Nelva Bush, MD;  Location: [facility];  Service:  Cardiovascular;  Laterality: N/A;  . CHOLECYSTECTOMY  nov 2004  . COLONOSCOPY  02/02/2016   per Dr. Henrene Pastor, adenomatous polyps, repeat in 5 yrs   . INTRAVASCULAR PRESSURE WIRE/FFR STUDY N/A 03/29/2018   Procedure: INTRAVASCULAR PRESSURE WIRE/FFR STUDY;  Surgeon: Nelva Bush, MD;  Location: [facility];  Service: Cardiovascular;  Laterality: N/A;  . KNEE ARTHROSCOPY     Rt. Knee  . LEFT HEART CATH AND CORONARY ANGIOGRAPHY N/A 03/29/2018   Procedure: LEFT HEART CATH AND CORONARY ANGIOGRAPHY;  Surgeon: Nelva Bush, MD;  Location: [facility];  Service: Cardiovascular;  Laterality: N/A;  . TONSILLECTOMY    . VAGINAL HYSTERECTOMY  1994    Family History  Problem Relation Age of Onset  . Heart disease Mother   . Heart disease Father        CABG  . Heart disease Brother        2 stents  . Ulcerative colitis Daughter   . Colon cancer Neg Hx      Current Outpatient Medications:  .  aspirin 81 MG tablet, Take 81 mg by mouth daily.  , Disp: , Rfl:  .  atorvastatin (LIPITOR) 80 MG tablet, Take 1 tablet (80 mg total) by mouth daily at 6 PM., Disp: 90 tablet, Rfl: 3 .  Calcium Carbonate (CALCIUM 600) 1500 MG TABS, Take 600 mg of elemental calcium by  mouth daily. , Disp: , Rfl:  .  cholecalciferol (VITAMIN D) 1000 UNITS tablet, Take 1,000 Units by mouth every evening. , Disp: , Rfl:  .  fluticasone (FLONASE) 50 MCG/ACT nasal spray, Place 2 sprays into both nostrils daily as needed for allergies. AS NEEDED, Disp: , Rfl:  .  isosorbide mononitrate (IMDUR) 30 MG 24 hr tablet, Take 1 tablet (30 mg total) by mouth daily., Disp: 90 tablet, Rfl: 3 .  Loratadine (CLARITIN) 10 MG CAPS, Take 10 mg by mouth daily. 24 hr tabs, Disp: , Rfl:  .  metoprolol succinate (TOPROL-XL) 25 MG 24 hr tablet, Take 1 tablet (25 mg total) by mouth daily., Disp: 90 tablet, Rfl: 3 .  nitroGLYCERIN (NITROSTAT) 0.4 MG SL tablet, Place 1 tablet (0.4 mg total) under the tongue every 5 (five) minutes as  needed. MAX 3 doses, Disp: 25 tablet, Rfl: 3 .  Probiotic Product (PROBIOTIC PO), Take 1 tablet by mouth daily. , Disp: , Rfl:  .  vitamin C (ASCORBIC ACID) 500 MG tablet, Take 500 mg by mouth at bedtime., Disp: , Rfl:  .  YUVAFEM 10 MCG TABS vaginal tablet, Place 10 mcg vaginally 2 (two) times a week. On Tuesday and Friday, Disp: , Rfl: 1  EXAM:  VITALS per patient if applicable:  GENERAL: alert, oriented, appears well and in no acute distress  HEENT: atraumatic, conjunttiva clear, no obvious abnormalities on inspection of external nose and ears  NECK: normal movements of the head and neck  LUNGS: on inspection no signs of respiratory distress, breathing rate appears normal, no obvious gross SOB, gasping or wheezing  CV: no obvious cyanosis  MS: moves all visible extremities without noticeable abnormality  PSYCH/NEURO: pleasant and cooperative, no obvious depression or anxiety, speech and thought processing grossly intact  ASSESSMENT AND PLAN: Constipation. To empty her bowels now, she will try magnesium citrate. I advised her to drink one bottle tonight and then a second bottle one hour later. For long term relief, she will try Miralax daily.  Alysia Penna, MD  Discussed the following assessment and plan:  No diagnosis found.     I discussed the assessment and treatment plan with the patient. The patient was provided an opportunity to ask questions and all were answered. The patient agreed with the plan and demonstrated an understanding of the instructions.   The patient was advised to call back or seek an in-person evaluation if the symptoms worsen or if the condition fails to improve as anticipated.

## 2018-12-04 ENCOUNTER — Ambulatory Visit (INDEPENDENT_AMBULATORY_CARE_PROVIDER_SITE_OTHER): Payer: Medicare Other | Admitting: Cardiology

## 2018-12-04 ENCOUNTER — Other Ambulatory Visit: Payer: Self-pay

## 2018-12-04 ENCOUNTER — Encounter: Payer: Self-pay | Admitting: Cardiology

## 2018-12-04 VITALS — BP 114/64 | HR 58 | Temp 97.0°F | Ht 65.0 in | Wt 170.0 lb

## 2018-12-04 DIAGNOSIS — E785 Hyperlipidemia, unspecified: Secondary | ICD-10-CM | POA: Diagnosis not present

## 2018-12-04 DIAGNOSIS — I2510 Atherosclerotic heart disease of native coronary artery without angina pectoris: Secondary | ICD-10-CM | POA: Diagnosis not present

## 2018-12-04 DIAGNOSIS — I10 Essential (primary) hypertension: Secondary | ICD-10-CM | POA: Diagnosis not present

## 2018-12-04 DIAGNOSIS — I350 Nonrheumatic aortic (valve) stenosis: Secondary | ICD-10-CM | POA: Diagnosis not present

## 2018-12-04 NOTE — Patient Instructions (Signed)

## 2018-12-05 ENCOUNTER — Telehealth: Payer: Self-pay | Admitting: Family Medicine

## 2018-12-05 DIAGNOSIS — K5900 Constipation, unspecified: Secondary | ICD-10-CM

## 2018-12-05 NOTE — Telephone Encounter (Signed)
Please advise. Per OV notes she was instructed to drink magnesium citrate.

## 2018-12-05 NOTE — Telephone Encounter (Signed)
Copied from [place] [id number]. Topic: General - Other >> Dec 05, 2018  1:54 PM Alanda Slim E wrote: Reason for CRM: Pt called to speak with nurse about medication she was prescribed for constipation that is not working. Please advise

## 2018-12-06 ENCOUNTER — Encounter: Payer: Self-pay | Admitting: Internal Medicine

## 2018-12-06 NOTE — Telephone Encounter (Signed)
Left a detailed message on verified voice mail.  CRM created.  

## 2018-12-06 NOTE — Telephone Encounter (Signed)
Tell her to increase the Miralax to BID. Also I will refer her to Dr. Henrene Pastor (GI)

## 2018-12-08 NOTE — Telephone Encounter (Signed)
Pt want to know the medication that she is taking for constipation can she take more of it. Please call pt to advise. Pt stated she didn't receive the message from 8.26.20

## 2018-12-08 NOTE — Telephone Encounter (Signed)
Left message for patient to call back. Please relay Dr. Barbie Banner message below. CRM created.

## 2018-12-11 NOTE — Telephone Encounter (Signed)
Left message for patient to call back  

## 2018-12-22 DIAGNOSIS — Z23 Encounter for immunization: Secondary | ICD-10-CM | POA: Diagnosis not present

## 2018-12-25 DIAGNOSIS — M2391 Unspecified internal derangement of right knee: Secondary | ICD-10-CM | POA: Diagnosis not present

## 2019-01-08 ENCOUNTER — Other Ambulatory Visit: Payer: Self-pay

## 2019-01-08 ENCOUNTER — Encounter: Payer: Self-pay | Admitting: Family Medicine

## 2019-01-08 ENCOUNTER — Ambulatory Visit (INDEPENDENT_AMBULATORY_CARE_PROVIDER_SITE_OTHER): Payer: Medicare Other | Admitting: Family Medicine

## 2019-01-08 VITALS — BP 120/68 | HR 55 | Temp 96.0°F | Ht 64.0 in | Wt 164.7 lb

## 2019-01-08 DIAGNOSIS — I350 Nonrheumatic aortic (valve) stenosis: Secondary | ICD-10-CM

## 2019-01-08 DIAGNOSIS — I200 Unstable angina: Secondary | ICD-10-CM

## 2019-01-08 DIAGNOSIS — E7800 Pure hypercholesterolemia, unspecified: Secondary | ICD-10-CM | POA: Diagnosis not present

## 2019-01-08 DIAGNOSIS — I10 Essential (primary) hypertension: Secondary | ICD-10-CM

## 2019-01-08 LAB — TSH: TSH: 2.09 u[IU]/mL (ref 0.35–4.50)

## 2019-01-08 LAB — POC URINALSYSI DIPSTICK (AUTOMATED)
Bilirubin, UA: NEGATIVE
Blood, UA: NEGATIVE
Glucose, UA: NEGATIVE
Ketones, UA: NEGATIVE
Leukocytes, UA: NEGATIVE
Nitrite, UA: NEGATIVE
Protein, UA: NEGATIVE
Spec Grav, UA: >=1.03 — AB (ref 1.010–1.025)
Urobilinogen, UA: 0.2 E.U./dL
pH, UA: 5 (ref 5.0–8.0)

## 2019-01-08 LAB — HEPATIC FUNCTION PANEL
ALT: 33 U/L (ref 0–35)
AST: 28 U/L (ref 0–37)
Albumin: 4.4 g/dL (ref 3.5–5.2)
Alkaline Phosphatase: 121 U/L — ABNORMAL HIGH (ref 39–117)
Bilirubin, Direct: 0.1 mg/dL (ref 0.0–0.3)
Total Bilirubin: 0.6 mg/dL (ref 0.2–1.2)
Total Protein: 6.4 g/dL (ref 6.0–8.3)

## 2019-01-08 LAB — BASIC METABOLIC PANEL
BUN: 31 mg/dL — ABNORMAL HIGH (ref 6–23)
CO2: 28 mEq/L (ref 19–32)
Calcium: 9.6 mg/dL (ref 8.4–10.5)
Chloride: 106 mEq/L (ref 96–112)
Creatinine, Ser: 1.12 mg/dL (ref 0.40–1.20)
GFR: 47.57 mL/min — ABNORMAL LOW (ref >60.00)
Glucose, Bld: 83 mg/dL (ref 70–99)
Potassium: 4.7 mEq/L (ref 3.5–5.1)
Sodium: 142 mEq/L (ref 135–145)

## 2019-01-08 LAB — CBC WITH DIFFERENTIAL/PLATELET
Basophils Absolute: 0 10*3/uL (ref 0.0–0.1)
Basophils Relative: 0.5 % (ref 0.0–3.0)
Eosinophils Absolute: 0.2 10*3/uL (ref 0.0–0.7)
Eosinophils Relative: 1.8 % (ref 0.0–5.0)
HCT: 43.4 % (ref 36.0–46.0)
Hemoglobin: 14.2 g/dL (ref 12.0–15.0)
Lymphocytes Relative: 16.1 % (ref 12.0–46.0)
Lymphs Abs: 1.5 10*3/uL (ref 0.7–4.0)
MCHC: 32.8 g/dL (ref 30.0–36.0)
MCV: 80.8 fl (ref 78.0–100.0)
Monocytes Absolute: 0.6 10*3/uL (ref 0.1–1.0)
Monocytes Relative: 6.3 % (ref 3.0–12.0)
Neutro Abs: 7.2 10*3/uL (ref 1.4–7.7)
Neutrophils Relative %: 75.3 % (ref 43.0–77.0)
Platelets: 166 10*3/uL (ref 150.0–400.0)
RBC: 5.37 Mil/uL — ABNORMAL HIGH (ref 3.87–5.11)
RDW: 16.1 % — ABNORMAL HIGH (ref 11.5–15.5)
WBC: 9.6 10*3/uL (ref 4.0–10.5)

## 2019-01-08 LAB — LIPID PANEL
Cholesterol: 102 mg/dL (ref 0–200)
HDL: 57.1 mg/dL (ref >39.00)
LDL Cholesterol: 28 mg/dL (ref 0–99)
NonHDL: 44.99
Total CHOL/HDL Ratio: 2
Triglycerides: 86 mg/dL (ref 0.0–149.0)
VLDL: 17.2 mg/dL (ref 0.0–40.0)

## 2019-01-08 MED ORDER — MELOXICAM 15 MG PO TABS: 15.0000 mg | ORAL_TABLET | Freq: Every day | ORAL | 3 refills | Status: DC

## 2019-01-08 NOTE — Progress Notes (Signed)
   Subjective:    Patient ID: Anne Martin, female    DOB: 07-11-1945, 73 y.o.   MRN: [id number]  HPI Here to follow up on issues. She feels great except for some left knee pain. She is waiting on an MRI soon per her orthopedist. She saw Dr. Stanford Martin last month and she seems to be stable from a cardiac standpoint. Her aortic enlargement is stable, as is her aortic valve. The recent CT scan showed 3 tiny nodules in the left lower lung, and a 12 month follow up is planned. Her BP is stable.    Review of Systems  Constitutional: Negative.   HENT: Negative.   Eyes: Negative.   Respiratory: Negative.   Cardiovascular: Negative.   Gastrointestinal: Negative.   Genitourinary: Negative for decreased urine volume, difficulty urinating, dyspareunia, dysuria, enuresis, flank pain, frequency, hematuria, pelvic pain and urgency.  Musculoskeletal: Negative.   Skin: Negative.   Neurological: Negative.   Psychiatric/Behavioral: Negative.        Objective:   Physical Exam Constitutional:      General: She is not in acute distress.    Appearance: She is well-developed.  HENT:     Head: Normocephalic and atraumatic.     Right Ear: External ear normal.     Left Ear: External ear normal.     Nose: Nose normal.     Mouth/Throat:     Pharynx: No oropharyngeal exudate.  Eyes:     General: No scleral icterus.    Conjunctiva/sclera: Conjunctivae normal.     Pupils: Pupils are equal, round, and reactive to light.  Neck:     Musculoskeletal: Normal range of motion and neck supple.     Thyroid: No thyromegaly.     Vascular: No JVD.  Cardiovascular:     Rate and Rhythm: Normal rate and regular rhythm.     Heart sounds: Normal heart sounds. No murmur. No friction rub. No gallop.   Pulmonary:     Effort: Pulmonary effort is normal. No respiratory distress.     Breath sounds: Normal breath sounds. No wheezing or rales.  Chest:     Chest wall: No tenderness.  Abdominal:     General: Bowel sounds  are normal. There is no distension.     Palpations: Abdomen is soft. There is no mass.     Tenderness: There is no abdominal tenderness. There is no guarding or rebound.  Musculoskeletal: Normal range of motion.        General: No tenderness.  Lymphadenopathy:     Cervical: No cervical adenopathy.  Skin:    General: Skin is warm and dry.     Findings: No erythema or rash.  Neurological:     Mental Status: She is alert and oriented to person, place, and time.     Cranial Nerves: No cranial nerve deficit.     Motor: No abnormal muscle tone.     Coordination: Coordination normal.     Deep Tendon Reflexes: Reflexes are normal and symmetric. Reflexes normal.  Psychiatric:        Behavior: Behavior normal.        Thought Content: Thought content normal.        Judgment: Judgment normal.           Assessment & Plan:  Her BP and aortic stenosis are stable. She will follow up with orthopedics for the knee pain. Get fasting labs today for a lipid panel, etc.  Alysia Penna, MD

## 2019-01-11 ENCOUNTER — Ambulatory Visit (INDEPENDENT_AMBULATORY_CARE_PROVIDER_SITE_OTHER): Payer: Medicare Other | Admitting: Internal Medicine

## 2019-01-11 ENCOUNTER — Encounter: Payer: Self-pay | Admitting: Internal Medicine

## 2019-01-11 ENCOUNTER — Other Ambulatory Visit: Payer: Self-pay

## 2019-01-11 VITALS — BP 110/62 | HR 64 | Temp 95.7°F | Ht 65.0 in | Wt 162.0 lb

## 2019-01-11 DIAGNOSIS — R7989 Other specified abnormal findings of blood chemistry: Secondary | ICD-10-CM

## 2019-01-11 DIAGNOSIS — K5900 Constipation, unspecified: Secondary | ICD-10-CM

## 2019-01-11 DIAGNOSIS — Z86010 Personal history of colonic polyps: Secondary | ICD-10-CM

## 2019-01-11 DIAGNOSIS — I200 Unstable angina: Secondary | ICD-10-CM

## 2019-01-11 NOTE — Progress Notes (Signed)
HISTORY OF PRESENT ILLNESS:  Anne Martin is a 73 y.o. female, retired [employer], who presents today with a new complaint of constipation.  The patient has been followed in this office for history of adenomatous colon polyps and sessile serrated polyps.  Index examination in [place] in 2002.  Prior examinations here 2012 and most recently October 2017.  The last examination revealed a 3 mm sigmoid colon polyp which was removed and found to be a tubular adenoma.  The exam was otherwise normal.  Follow-up in 5 years recommended.  Patient tells me that she and her husband have been on a strict diet.  She has lost over 20 pounds.  During this time she is noticed less frequent bowel movements.  At one point, went almost 1 week without a bowel movement.  Despite this there has been no associated abdominal discomfort or bloating.  If concerns are regarding the frequency of bowel movements.  No new medications.  She is otherwise well.  No bleeding.  Review of outside laboratories from November 22, 2018 finds mild elevation of hepatic transaminases and alkaline phosphatase.  Repeat liver test January 08, 2019 with normal transaminases and trivial elevation of alkaline phosphatase.  Being followed by Dr. Sarajane Jews CBC from December 2019 was normal with hemoglobin 13.9.  Patient tells me that she has tried MiraLAX for her constipation.  This has helped.  She tolerated it well without side effects.  REVIEW OF SYSTEMS:  All non-GI ROS negative unless otherwise stated in the HPI except for seasonal allergies  Past Medical History:  Diagnosis Date  . Allergy    seasonal  . Anemia    prior to hysterectomy, fibroids  . Aortic insufficiency    a. 10/2015 Echo: mild to moderate AI.  Marland Kitchen Aortic stenosis    a. 10/2015 Echo: moderate AS.  Marland Kitchen Blood transfusion without reported diagnosis    with hysterectomy  . CAD (coronary artery disease)    a. 08/2009 s/p cath after abnormal nuclear study; LM nl, LAD 60%, D2 90%  (PCI/DES), LCX 50 -60%, RCA nl, nl EF;  b. 2012 MV: no ischemia/infarct;  c. 08/2015 MV: no ischemia/infarct, EF 70%.  . Diastolic dysfunction    a. 10/2015 Echo: EF 55-60%, no rwma, Gr1 DD, mod AS, mild to mod AI.  Marland Kitchen Essential hypertension   . H/O: rheumatic fever   . History of colonoscopy   . Hyperlipidemia   . Osteopenia    last DEXA 11-22-13     Past Surgical History:  Procedure Laterality Date  . ANGIOPLASTY  may 2011   stents  . AORTIC ARCH ANGIOGRAPHY N/A 03/29/2018   Procedure: AORTIC ARCH ANGIOGRAPHY;  Surgeon: Nelva Bush, MD;  Location: [facility];  Service: Cardiovascular;  Laterality: N/A;  . CHOLECYSTECTOMY  nov 2004  . COLONOSCOPY  02/02/2016   per Dr. Henrene Pastor, adenomatous polyps, repeat in 5 yrs   . INTRAVASCULAR PRESSURE WIRE/FFR STUDY N/A 03/29/2018   Procedure: INTRAVASCULAR PRESSURE WIRE/FFR STUDY;  Surgeon: Nelva Bush, MD;  Location: [facility];  Service: Cardiovascular;  Laterality: N/A;  . KNEE ARTHROSCOPY     Rt. Knee  . LEFT HEART CATH AND CORONARY ANGIOGRAPHY N/A 03/29/2018   Procedure: LEFT HEART CATH AND CORONARY ANGIOGRAPHY;  Surgeon: Nelva Bush, MD;  Location: [facility];  Service: Cardiovascular;  Laterality: N/A;  . TONSILLECTOMY    . [place]    Social History YESLIN COLFER  reports that she  quit smoking about 41 years ago. Her smoking use included cigarettes. She has never used smokeless tobacco. She reports current alcohol use. She reports that she does not use drugs.  family history includes Heart disease in her brother, father, and mother; Ulcerative colitis in her daughter.  Allergies  Allergen Reactions  . Procaine Hcl Other (See Comments)    Unknown       PHYSICAL EXAMINATION: Vital signs: BP 110/62   Pulse 64   Temp (!) 95.7 F (35.4 C)   Ht 5\' 5"  (1.651 m)   Wt 162 lb (73.5 kg)   LMP  (LMP Unknown)   SpO2 98%   BMI 26.96 kg/m   Constitutional: generally well-appearing,  no acute distress Psychiatric: alert and oriented x3, cooperative Eyes: extraocular movements intact, anicteric, conjunctiva pink Mouth: oral pharynx moist, no lesions Neck: supple no lymphadenopathy Cardiovascular: heart regular rate and rhythm, no murmur Lungs: clear to auscultation bilaterally Abdomen: soft, nontender, nondistended, no obvious ascites, no peritoneal signs, normal bowel sounds, no organomegaly Rectal: Omitted Extremities: no clubbing, cyanosis, or lower extremity edema bilaterally Skin: no lesions on visible extremities Neuro: No focal deficits.  Cranial nerves intact  ASSESSMENT:  1.  Functional constipation.  Was likely due to dietary changes.  No worrisome features or significant associated symptoms 2.  History of adenomatous and sessile serrated polyps.  Surveillance up-to-date 3.  Last colonoscopy October 2017 was normal except for diminutive polyp 4.  Mild elevation of liver tests on one occasion August 2020.  Improved September 2020.  Being monitored by Dr. Sarajane Jews  PLAN:  1.  MiraLAX for constipation.  I discussed with her the proper way to titrate MiraLAX in order to achieve the desired effect. 2.  Surveillance colonoscopy around October 2022 3.  Interval follow-up as needed 25-minute spent face-to-face with the patient.  Greater than 50% of time used for counseling regarding her new onset constipation, etiology and its management

## 2019-01-11 NOTE — Patient Instructions (Signed)
Use Miralax as needed  Please follow up as needed

## 2019-01-22 DIAGNOSIS — M25561 Pain in right knee: Secondary | ICD-10-CM | POA: Diagnosis not present

## 2019-02-02 DIAGNOSIS — M25561 Pain in right knee: Secondary | ICD-10-CM | POA: Diagnosis not present

## 2019-02-02 DIAGNOSIS — S83281A Other tear of lateral meniscus, current injury, right knee, initial encounter: Secondary | ICD-10-CM | POA: Diagnosis not present

## 2019-02-02 DIAGNOSIS — S83241A Other tear of medial meniscus, current injury, right knee, initial encounter: Secondary | ICD-10-CM | POA: Diagnosis not present

## 2019-02-02 DIAGNOSIS — M238X1 Other internal derangements of right knee: Secondary | ICD-10-CM | POA: Diagnosis not present

## 2019-02-14 ENCOUNTER — Other Ambulatory Visit: Payer: Self-pay | Admitting: Cardiology

## 2019-02-14 MED ORDER — ISOSORBIDE MONONITRATE ER 30 MG PO TB24: 30.0000 mg | ORAL_TABLET | Freq: Every day | ORAL | 1 refills | Status: DC

## 2019-02-14 NOTE — Telephone Encounter (Signed)
°*  STAT* If patient is at the pharmacy, call can be transferred to refill team.   1. Which medications need to be refilled? (please list name of each medication and dose if known) Isosorbide mononit er 30mg   2. Which pharmacy/location (including street and city if local pharmacy) is medication to [facility] sent to?[facility]  3. Do they need a 30 day or 90 day supply? [place]

## 2019-02-26 ENCOUNTER — Other Ambulatory Visit: Payer: Self-pay

## 2019-02-26 DIAGNOSIS — Z20828 Contact with and (suspected) exposure to other viral communicable diseases: Secondary | ICD-10-CM | POA: Diagnosis not present

## 2019-02-26 DIAGNOSIS — Z20822 Contact with and (suspected) exposure to covid-19: Secondary | ICD-10-CM

## 2019-02-28 ENCOUNTER — Encounter: Payer: Self-pay | Admitting: Family Medicine

## 2019-02-28 ENCOUNTER — Other Ambulatory Visit: Payer: Self-pay

## 2019-02-28 ENCOUNTER — Telehealth (INDEPENDENT_AMBULATORY_CARE_PROVIDER_SITE_OTHER): Payer: Medicare Other | Admitting: Family Medicine

## 2019-02-28 DIAGNOSIS — J029 Acute pharyngitis, unspecified: Secondary | ICD-10-CM

## 2019-02-28 DIAGNOSIS — I200 Unstable angina: Secondary | ICD-10-CM

## 2019-02-28 LAB — NOVEL CORONAVIRUS, NAA: SARS-CoV-2, NAA: NOT DETECTED

## 2019-02-28 MED ORDER — CEPHALEXIN 500 MG PO CAPS: 500.0000 mg | ORAL_CAPSULE | Freq: Three times a day (TID) | ORAL | 0 refills | Status: AC

## 2019-02-28 NOTE — Progress Notes (Signed)
Virtual Visit via Video Note  I connected with the patient on 02/28/19 at  1:00 PM EST by a video enabled telemedicine application and verified that I am speaking with the correct person using two identifiers.  Location patient: home Location provider:work or home office Persons participating in the virtual visit: patient, provider  I discussed the limitations of evaluation and management by telemedicine and the availability of in person appointments. The patient expressed understanding and agreed to proceed.   HPI: Here for one week of ST, swollen neck nodes, headache, and body aches. No fever or cough or SOB or NVD. She tested negative for the Covid-19 virus on 02-26-19. She is taking Ibuprofen and drinking fluids.    ROS: See pertinent positives and negatives per HPI.  Past Medical History:  Diagnosis Date  . Allergy    seasonal  . Anemia    prior to hysterectomy, fibroids  . Aortic insufficiency    a. 10/2015 Echo: mild to moderate AI.  Marland Kitchen Aortic stenosis    a. 10/2015 Echo: moderate AS.  Marland Kitchen Blood transfusion without reported diagnosis    with hysterectomy  . CAD (coronary artery disease)    a. 08/2009 s/p cath after abnormal nuclear study; LM nl, LAD 60%, D2 90% (PCI/DES), LCX 50 -60%, RCA nl, nl EF;  b. 2012 MV: no ischemia/infarct;  c. 08/2015 MV: no ischemia/infarct, EF 70%.  . Diastolic dysfunction    a. 10/2015 Echo: EF 55-60%, no rwma, Gr1 DD, mod AS, mild to mod AI.  Marland Kitchen Essential hypertension   . H/O: rheumatic fever   . History of colonoscopy   . Hyperlipidemia   . Osteopenia    last DEXA 11-22-13     Past Surgical History:  Procedure Laterality Date  . ANGIOPLASTY  may 2011   stents  . AORTIC ARCH ANGIOGRAPHY N/A 03/29/2018   Procedure: AORTIC ARCH ANGIOGRAPHY;  Surgeon: Nelva Bush, MD;  Location: [place];  Service: Cardiovascular;  Laterality: N/A;  . CHOLECYSTECTOMY  nov 2004  . COLONOSCOPY  02/02/2016   per Dr. Henrene Pastor, adenomatous polyps, repeat  in 5 yrs   . INTRAVASCULAR PRESSURE WIRE/FFR STUDY N/A 03/29/2018   Procedure: INTRAVASCULAR PRESSURE WIRE/FFR STUDY;  Surgeon: Nelva Bush, MD;  Location: [facility] [facility];  Service: Cardiovascular;  Laterality: N/A;  . KNEE ARTHROSCOPY     Rt. Knee  . LEFT HEART CATH AND CORONARY ANGIOGRAPHY N/A 03/29/2018   Procedure: LEFT HEART CATH AND CORONARY ANGIOGRAPHY;  Surgeon: Nelva Bush, MD;  Location: [facility];  Service: Cardiovascular;  Laterality: N/A;  . TONSILLECTOMY    . VAGINAL HYSTERECTOMY  1994    Family History  Problem Relation Age of Onset  . Heart disease Mother   . Heart disease Father        CABG  . Heart disease Brother        2 stents  . Ulcerative colitis Daughter   . Colon cancer Neg Hx      Current Outpatient Medications:  .  aspirin 81 MG tablet, Take 81 mg by mouth daily.  , Disp: , Rfl:  .  atorvastatin (LIPITOR) 80 MG tablet, Take 1 tablet (80 mg total) by mouth daily at 6 PM., Disp: 90 tablet, Rfl: 3 .  Calcium Carbonate (CALCIUM 600) 1500 MG TABS, Take 600 mg of elemental calcium by mouth daily. , Disp: , Rfl:  .  cephALEXin (KEFLEX) 500 MG capsule, Take 1 capsule (500 mg total) by mouth 3 (three)  times daily for 10 days., Disp: 30 capsule, Rfl: 0 .  cholecalciferol (VITAMIN D) 1000 UNITS tablet, Take 1,000 Units by mouth every evening. , Disp: , Rfl:  .  fluticasone (FLONASE) 50 MCG/ACT nasal spray, Place 2 sprays into both nostrils daily as needed for allergies. AS NEEDED, Disp: , Rfl:  .  isosorbide mononitrate (IMDUR) 30 MG 24 hr tablet, Take 1 tablet (30 mg total) by mouth daily., Disp: 90 tablet, Rfl: 1 .  Loratadine (CLARITIN) 10 MG CAPS, Take 10 mg by mouth daily. 24 hr tabs, Disp: , Rfl:  .  meloxicam (MOBIC) 15 MG tablet, Take 1 tablet (15 mg total) by mouth daily., Disp: 90 tablet, Rfl: 3 .  metoprolol succinate (TOPROL-XL) 25 MG 24 hr tablet, Take 1 tablet (25 mg total) by mouth daily., Disp: 90 tablet, Rfl: 3 .  nitroGLYCERIN  (NITROSTAT) 0.4 MG SL tablet, Place 1 tablet (0.4 mg total) under the tongue every 5 (five) minutes as needed. MAX 3 doses, Disp: 25 tablet, Rfl: 3 .  Probiotic Product (PROBIOTIC PO), Take 1 tablet by mouth daily. , Disp: , Rfl:  .  vitamin C (ASCORBIC ACID) 500 MG tablet, Take 500 mg by mouth at bedtime., Disp: , Rfl:  .  YUVAFEM 10 MCG TABS vaginal tablet, Place 10 mcg vaginally 2 (two) times a week. On Tuesday and Friday, Disp: , Rfl: 1  EXAM:  VITALS per patient if applicable:  GENERAL: alert, oriented, appears well and in no acute distress  HEENT: atraumatic, conjunttiva clear, no obvious abnormalities on inspection of external nose and ears  NECK: normal movements of the head and neck  LUNGS: on inspection no signs of respiratory distress, breathing rate appears normal, no obvious gross SOB, gasping or wheezing  CV: no obvious cyanosis  MS: moves all visible extremities without noticeable abnormality  PSYCH/NEURO: pleasant and cooperative, no obvious depression or anxiety, speech and thought processing grossly intact  ASSESSMENT AND PLAN: Possible strep throat, treat with Keflex.  Anne Penna, MD  Discussed the following assessment and plan:  No diagnosis found.     I discussed the assessment and treatment plan with the patient. The patient was provided an opportunity to ask questions and all were answered. The patient agreed with the plan and demonstrated an understanding of the instructions.   The patient was advised to call back or seek an in-person evaluation if the symptoms worsen or if the condition fails to improve as anticipated.

## 2019-03-01 ENCOUNTER — Telehealth: Payer: Self-pay

## 2019-03-01 NOTE — Telephone Encounter (Signed)
Patient given negative result and verbalized understanding  

## 2019-03-12 ENCOUNTER — Encounter: Payer: Self-pay | Admitting: Family Medicine

## 2019-03-12 ENCOUNTER — Other Ambulatory Visit: Payer: Self-pay

## 2019-03-12 ENCOUNTER — Telehealth (INDEPENDENT_AMBULATORY_CARE_PROVIDER_SITE_OTHER): Payer: Medicare Other | Admitting: Family Medicine

## 2019-03-12 DIAGNOSIS — I200 Unstable angina: Secondary | ICD-10-CM | POA: Diagnosis not present

## 2019-03-12 DIAGNOSIS — L739 Follicular disorder, unspecified: Secondary | ICD-10-CM

## 2019-03-12 MED ORDER — DOXYCYCLINE HYCLATE 100 MG PO CAPS: 100.0000 mg | ORAL_CAPSULE | Freq: Two times a day (BID) | ORAL | 0 refills | Status: AC

## 2019-03-12 NOTE — Progress Notes (Signed)
Virtual Visit via Video Note  I connected with the patient on 03/12/19 at  1:00 PM EST by a video enabled telemedicine application and verified that I am speaking with the correct person using two identifiers.  Location patient: home Location provider:work or home office Persons participating in the virtual visit: patient, provider  I discussed the limitations of evaluation and management by telemedicine and the availability of in person appointments. The patient expressed understanding and agreed to proceed.   HPI: Here for 2 days of scattered red small bumps in the back of the head and along the back of the neck. This involves both sides equally. She feels well in general. No fever.    ROS: See pertinent positives and negatives per HPI.  Past Medical History:  Diagnosis Date  . Allergy    seasonal  . Anemia    prior to hysterectomy, fibroids  . Aortic insufficiency    a. 10/2015 Echo: mild to moderate AI.  Marland Kitchen Aortic stenosis    a. 10/2015 Echo: moderate AS.  Marland Kitchen Blood transfusion without reported diagnosis    with hysterectomy  . CAD (coronary artery disease)    a. 08/2009 s/p cath after abnormal nuclear study; LM nl, LAD 60%, D2 90% (PCI/DES), LCX 50 -60%, RCA nl, nl EF;  b. 2012 MV: no ischemia/infarct;  c. 08/2015 MV: no ischemia/infarct, EF 70%.  . Diastolic dysfunction    a. 10/2015 Echo: EF 55-60%, no rwma, Gr1 DD, mod AS, mild to mod AI.  Marland Kitchen Essential hypertension   . H/O: rheumatic fever   . History of colonoscopy   . Hyperlipidemia   . Osteopenia    last DEXA 11-22-13     Past Surgical History:  Procedure Laterality Date  . ANGIOPLASTY  may 2011   stents  . AORTIC ARCH ANGIOGRAPHY N/A 03/29/2018   Procedure: AORTIC ARCH ANGIOGRAPHY;  Surgeon: Nelva Bush, MD;  Location: [facility];  Service: Cardiovascular;  Laterality: N/A;  . CHOLECYSTECTOMY  nov 2004  . COLONOSCOPY  02/02/2016   per Dr. Henrene Pastor, adenomatous polyps, repeat in 5 yrs   . INTRAVASCULAR  PRESSURE WIRE/FFR STUDY N/A 03/29/2018   Procedure: INTRAVASCULAR PRESSURE WIRE/FFR STUDY;  Surgeon: Nelva Bush, MD;  Location: [facility];  Service: Cardiovascular;  Laterality: N/A;  . KNEE ARTHROSCOPY     Rt. Knee  . LEFT HEART CATH AND CORONARY ANGIOGRAPHY N/A 03/29/2018   Procedure: LEFT HEART CATH AND CORONARY ANGIOGRAPHY;  Surgeon: Nelva Bush, MD;  Location: [place];  Service: Cardiovascular;  Laterality: N/A;  . TONSILLECTOMY    . VAGINAL HYSTERECTOMY  1994    Family History  Problem Relation Age of Onset  . Heart disease Mother   . Heart disease Father        CABG  . Heart disease Brother        2 stents  . Ulcerative colitis Daughter   . Colon cancer Neg Hx      Current Outpatient Medications:  .  aspirin 81 MG tablet, Take 81 mg by mouth daily.  , Disp: , Rfl:  .  atorvastatin (LIPITOR) 80 MG tablet, Take 1 tablet (80 mg total) by mouth daily at 6 PM., Disp: 90 tablet, Rfl: 3 .  Calcium Carbonate (CALCIUM 600) 1500 MG TABS, Take 600 mg of elemental calcium by mouth daily. , Disp: , Rfl:  .  cholecalciferol (VITAMIN D) 1000 UNITS tablet, Take 1,000 Units by mouth every evening. , Disp: , Rfl:  .  fluticasone (FLONASE) 50 MCG/ACT nasal spray, Place 2 sprays into both nostrils daily as needed for allergies. AS NEEDED, Disp: , Rfl:  .  isosorbide mononitrate (IMDUR) 30 MG 24 hr tablet, Take 1 tablet (30 mg total) by mouth daily., Disp: 90 tablet, Rfl: 1 .  Loratadine (CLARITIN) 10 MG CAPS, Take 10 mg by mouth daily. 24 hr tabs, Disp: , Rfl:  .  meloxicam (MOBIC) 15 MG tablet, Take 1 tablet (15 mg total) by mouth daily., Disp: 90 tablet, Rfl: 3 .  metoprolol succinate (TOPROL-XL) 25 MG 24 hr tablet, Take 1 tablet (25 mg total) by mouth daily., Disp: 90 tablet, Rfl: 3 .  nitroGLYCERIN (NITROSTAT) 0.4 MG SL tablet, Place 1 tablet (0.4 mg total) under the tongue every 5 (five) minutes as needed. MAX 3 doses, Disp: 25 tablet, Rfl: 3 .  Probiotic  Product (PROBIOTIC PO), Take 1 tablet by mouth daily. , Disp: , Rfl:  .  vitamin C (ASCORBIC ACID) 500 MG tablet, Take 500 mg by mouth at bedtime., Disp: , Rfl:  .  YUVAFEM 10 MCG TABS vaginal tablet, Place 10 mcg vaginally 2 (two) times a week. On Tuesday and Friday, Disp: , Rfl: 1  EXAM:  VITALS per patient if applicable:  GENERAL: alert, oriented, appears well and in no acute distress  HEENT: atraumatic, conjunttiva clear, no obvious abnormalities on inspection of external nose and ears  NECK: normal movements of the head and neck  LUNGS: on inspection no signs of respiratory distress, breathing rate appears normal, no obvious gross SOB, gasping or wheezing  CV: no obvious cyanosis  MS: moves all visible extremities without noticeable abnormality  PSYCH/NEURO: pleasant and cooperative, no obvious depression or anxiety, speech and thought processing grossly intact  ASSESSMENT AND PLAN: The fact that this is bilateral makes me think this is folliculitis rather than shingles. Treat with Doxycycline and follow up as needed.  Alysia Penna, MD  Discussed the following assessment and plan:  No diagnosis found.     I discussed the assessment and treatment plan with the patient. The patient was provided an opportunity to ask questions and all were answered. The patient agreed with the plan and demonstrated an understanding of the instructions.   The patient was advised to call back or seek an in-person evaluation if the symptoms worsen or if the condition fails to improve as anticipated.

## 2019-03-14 ENCOUNTER — Encounter: Payer: Self-pay | Admitting: Family Medicine

## 2019-03-15 NOTE — Telephone Encounter (Signed)
Yes she can get the shingles vaccine any time she likes

## 2019-04-16 ENCOUNTER — Other Ambulatory Visit: Payer: Self-pay | Admitting: Cardiology

## 2019-04-16 NOTE — Telephone Encounter (Signed)
*  STAT* If patient is at the pharmacy, call can be transferred to refill team.   1. Which medications need to be refilled? (please list name of each medication and dose if known) metoprolol 25mg   2. Which pharmacy/location (including street and city if local pharmacy) is medication [facility]  3. Do they need a 30 day or 90 day supply? [place]

## 2019-04-17 MED ORDER — METOPROLOL SUCCINATE ER 25 MG PO TB24: 25.0000 mg | ORAL_TABLET | Freq: Every day | ORAL | 2 refills | Status: DC

## 2019-04-17 NOTE — Telephone Encounter (Signed)
Rx has been sent to the pharmacy electronically. ° °

## 2019-04-21 ENCOUNTER — Ambulatory Visit: Payer: Medicare Other | Attending: Internal Medicine

## 2019-04-21 DIAGNOSIS — Z23 Encounter for immunization: Secondary | ICD-10-CM | POA: Diagnosis not present

## 2019-04-21 NOTE — Progress Notes (Signed)
   Covid-19 Vaccination Clinic  Name:  Anne Martin    MRN: [id number] DOB: [birth year]  04/21/2019  [name] was observed post Covid-19 immunization for 30 minutes based on pre-vaccination screening without incidence. She was provided with Vaccine Information Sheet and instruction to access the V-Safe system.   [name] was instructed to call 911 with any severe reactions post vaccine: Marland Kitchen Difficulty breathing  . Swelling of your face and throat  . A fast heartbeat  . A bad rash all over your body  . Dizziness and weakness    Immunizations Administered    Name Date Dose VIS Date Route   Pfizer COVID-19 Vaccine 04/21/2019 11:29 AM 0.3 mL 03/23/2019 Intramuscular   Manufacturer: [employer], [employer]   Lot: [id number]   [place]: [id number]

## 2019-04-27 DIAGNOSIS — Z01419 Encounter for gynecological examination (general) (routine) without abnormal findings: Secondary | ICD-10-CM | POA: Diagnosis not present

## 2019-04-27 DIAGNOSIS — Z124 Encounter for screening for malignant neoplasm of cervix: Secondary | ICD-10-CM | POA: Diagnosis not present

## 2019-04-27 DIAGNOSIS — N952 Postmenopausal atrophic vaginitis: Secondary | ICD-10-CM | POA: Diagnosis not present

## 2019-04-27 DIAGNOSIS — Z1231 Encounter for screening mammogram for malignant neoplasm of breast: Secondary | ICD-10-CM | POA: Diagnosis not present

## 2019-05-03 DIAGNOSIS — H02832 Dermatochalasis of right lower eyelid: Secondary | ICD-10-CM | POA: Diagnosis not present

## 2019-05-03 DIAGNOSIS — H02831 Dermatochalasis of right upper eyelid: Secondary | ICD-10-CM | POA: Diagnosis not present

## 2019-05-03 DIAGNOSIS — H02834 Dermatochalasis of left upper eyelid: Secondary | ICD-10-CM | POA: Diagnosis not present

## 2019-05-03 DIAGNOSIS — S40022A Contusion of left upper arm, initial encounter: Secondary | ICD-10-CM | POA: Diagnosis not present

## 2019-05-03 DIAGNOSIS — X58XXXA Exposure to other specified factors, initial encounter: Secondary | ICD-10-CM | POA: Diagnosis not present

## 2019-05-03 DIAGNOSIS — H02835 Dermatochalasis of left lower eyelid: Secondary | ICD-10-CM | POA: Diagnosis not present

## 2019-05-03 DIAGNOSIS — D485 Neoplasm of uncertain behavior of skin: Secondary | ICD-10-CM | POA: Diagnosis not present

## 2019-05-04 ENCOUNTER — Other Ambulatory Visit: Payer: Self-pay | Admitting: Obstetrics

## 2019-05-04 DIAGNOSIS — M8580 Other specified disorders of bone density and structure, unspecified site: Secondary | ICD-10-CM

## 2019-05-11 ENCOUNTER — Ambulatory Visit: Payer: Medicare Other

## 2019-05-12 ENCOUNTER — Ambulatory Visit: Payer: Medicare Other | Attending: Internal Medicine

## 2019-05-12 DIAGNOSIS — Z23 Encounter for immunization: Secondary | ICD-10-CM | POA: Insufficient documentation

## 2019-05-12 DIAGNOSIS — Z20822 Contact with and (suspected) exposure to covid-19: Secondary | ICD-10-CM | POA: Diagnosis not present

## 2019-05-12 DIAGNOSIS — D485 Neoplasm of uncertain behavior of skin: Secondary | ICD-10-CM | POA: Diagnosis not present

## 2019-05-12 NOTE — Progress Notes (Signed)
   Covid-19 Vaccination Clinic  Name:  Anne Martin    MRN: [id number] DOB: 1945/11/30  05/12/2019  [name] was observed post Covid-19 immunization for 15 minutes without incidence. She was provided with Vaccine Information Sheet and instruction to access the V-Safe system.   [name] was instructed to call 911 with any severe reactions post vaccine: Marland Kitchen Difficulty breathing  . Swelling of your face and throat  . A fast heartbeat  . A bad rash all over your body  . Dizziness and weakness    Immunizations Administered    Name Date Dose VIS Date Route   Pfizer COVID-19 Vaccine 05/12/2019  9:22 AM 0.3 mL 03/23/2019 Intramuscular   Manufacturer: [place]   Lot: [id number]   [place]: [id number]

## 2019-05-14 DIAGNOSIS — D485 Neoplasm of uncertain behavior of skin: Secondary | ICD-10-CM | POA: Diagnosis not present

## 2019-05-15 DIAGNOSIS — S83241D Other tear of medial meniscus, current injury, right knee, subsequent encounter: Secondary | ICD-10-CM | POA: Diagnosis not present

## 2019-05-15 DIAGNOSIS — M2391 Unspecified internal derangement of right knee: Secondary | ICD-10-CM | POA: Diagnosis not present

## 2019-05-15 DIAGNOSIS — M25561 Pain in right knee: Secondary | ICD-10-CM | POA: Diagnosis not present

## 2019-05-15 DIAGNOSIS — S83281D Other tear of lateral meniscus, current injury, right knee, subsequent encounter: Secondary | ICD-10-CM | POA: Diagnosis not present

## 2019-05-15 NOTE — Progress Notes (Signed)
HPI: FU CAD. Cardiac catheterization was performed in May of 2011. This revealed Normal LM, 60 LAD, 90 D2, 50-60 LCx, normal RCA, and nl LV function. Patient had PCI of the diagonal with DES at that time. Carotid Dopplers in Oct 2013 were normal.Cardiac catheterization December 2019 showed nonobstructive coronary disease with moderate mid LAD and moderate in-stent restenosis in second diagonal and no disease in the circumflex or right coronary artery. LV function was normal with normal LV filling pressures. The ascending aorta was mildly dilated. Echocardiogram August 2020 showed normal LV function, mild aortic insufficiency, mild aortic stenosis with mean gradient 12 mmHg. CTA August 2020 showed no thoracic aortic aneurysm. There were small nodules in the left lower lobe and follow-up recommended 1 year.  Since I last saw her, the patient denies any dyspnea on exertion, orthopnea, PND, pedal edema, palpitations, syncope or chest pain.   Current Outpatient Medications  Medication Sig Dispense Refill  . aspirin 81 MG tablet Take 81 mg by mouth daily.      Marland Kitchen atorvastatin (LIPITOR) 80 MG tablet Take 1 tablet (80 mg total) by mouth daily at 6 PM. 90 tablet 3  . Calcium Carbonate (CALCIUM 600) 1500 MG TABS Take 600 mg of elemental calcium by mouth daily.     . cholecalciferol (VITAMIN D) 1000 UNITS tablet Take 1,000 Units by mouth every evening.     . isosorbide mononitrate (IMDUR) 30 MG 24 hr tablet Take 1 tablet (30 mg total) by mouth daily. 90 tablet 1  . Loratadine (CLARITIN) 10 MG CAPS Take 10 mg by mouth daily. 24 hr tabs    . meloxicam (MOBIC) 15 MG tablet Take 1 tablet (15 mg total) by mouth daily. 90 tablet 3  . metoprolol succinate (TOPROL-XL) 25 MG 24 hr tablet Take 1 tablet (25 mg total) by mouth daily. 90 tablet 2  . nitroGLYCERIN (NITROSTAT) 0.4 MG SL tablet Place 1 tablet (0.4 mg total) under the tongue every 5 (five) minutes as needed. MAX 3 doses 25 tablet 3  . Probiotic Product  (PROBIOTIC PO) Take 1 tablet by mouth daily.     . vitamin C (ASCORBIC ACID) 500 MG tablet Take 500 mg by mouth at bedtime.    Merril Abbe 10 MCG TABS vaginal tablet Place 10 mcg vaginally 2 (two) times a week. On Tuesday and Friday  1   No current facility-administered medications for this visit.     Past Medical History:  Diagnosis Date  . Allergy    seasonal  . Anemia    prior to hysterectomy, fibroids  . Aortic insufficiency    a. 10/2015 Echo: mild to moderate AI.  Marland Kitchen Aortic stenosis    a. 10/2015 Echo: moderate AS.  Marland Kitchen Blood transfusion without reported diagnosis    with hysterectomy  . CAD (coronary artery disease)    a. 08/2009 s/p cath after abnormal nuclear study; LM nl, LAD 60%, D2 90% (PCI/DES), LCX 50 -60%, RCA nl, nl EF;  b. 2012 MV: no ischemia/infarct;  c. 08/2015 MV: no ischemia/infarct, EF 70%.  . Diastolic dysfunction    a. 10/2015 Echo: EF 55-60%, no rwma, Gr1 DD, mod AS, mild to mod AI.  Marland Kitchen Essential hypertension   . H/O: rheumatic fever   . History of colonoscopy   . Hyperlipidemia   . Osteopenia    last DEXA 11-22-13     Past Surgical History:  Procedure Laterality Date  . ANGIOPLASTY  may 2011   stents  .  AORTIC ARCH ANGIOGRAPHY N/A 03/29/2018   Procedure: AORTIC ARCH ANGIOGRAPHY;  Surgeon: Nelva Bush, MD;  Location: [facility];  Service: Cardiovascular;  Laterality: N/A;  . CHOLECYSTECTOMY  nov 2004  . COLONOSCOPY  02/02/2016   per Dr. Henrene Pastor, adenomatous polyps, repeat in 5 yrs   . INTRAVASCULAR PRESSURE WIRE/FFR STUDY N/A 03/29/2018   Procedure: INTRAVASCULAR PRESSURE WIRE/FFR STUDY;  Surgeon: Nelva Bush, MD;  Location: [facility];  Service: Cardiovascular;  Laterality: N/A;  . KNEE ARTHROSCOPY     Rt. Knee  . LEFT HEART CATH AND CORONARY ANGIOGRAPHY N/A 03/29/2018   Procedure: LEFT HEART CATH AND CORONARY ANGIOGRAPHY;  Surgeon: Nelva Bush, MD;  Location: [facility];  Service: Cardiovascular;  Laterality: N/A;  .  TONSILLECTOMY    . VAGINAL HYSTERECTOMY  1994    Social History   Socioeconomic History  . Marital status: Married    Spouse name: Not on file  . Number of children: 2  . Years of education: Not on file  . Highest education level: Not on file  Occupational History  . Occupation: Retired Product manager: RETIRED  Tobacco Use  . Smoking status: Former Smoker    Types: Cigarettes    Quit date: 04/12/1977    Years since quitting: 42.1  . Smokeless tobacco: Never Used  . Tobacco comment: quit age 55  Substance and Sexual Activity  . Alcohol use: Yes    Alcohol/week: 0.0 standard drinks    Comment: rarely  . Drug use: No  . Sexual activity: Not Currently  Other Topics Concern  . Not on file  Social History Narrative  . Not on file   Social Determinants of Health   Financial Resource Strain:   . Difficulty of Paying Living Expenses: Not on file  Food Insecurity:   . Worried About Charity fundraiser in the Last Year: Not on file  . Ran Out of Food in the Last Year: Not on file  Transportation Needs:   . Lack of Transportation (Medical): Not on file  . Lack of Transportation (Non-Medical): Not on file  Physical Activity:   . Days of Exercise per Week: Not on file  . Minutes of Exercise per Session: Not on file  Stress:   . Feeling of Stress : Not on file  Social Connections:   . Frequency of Communication with Friends and Family: Not on file  . Frequency of Social Gatherings with Friends and Family: Not on file  . Attends Religious Services: Not on file  . Active Member of Clubs or Organizations: Not on file  . Attends Archivist Meetings: Not on file  . Marital Status: Not on file  Intimate Partner Violence:   . Fear of Current or Ex-Partner: Not on file  . Emotionally Abused: Not on file  . Physically Abused: Not on file  . Sexually Abused: Not on file    Family History  Problem Relation Age of Onset  . Heart disease Mother   . Heart disease  Father        CABG  . Heart disease Brother        2 stents  . Ulcerative colitis Daughter   . Colon cancer Neg Hx     ROS: no fevers or chills, productive cough, hemoptysis, dysphasia, odynophagia, melena, hematochezia, dysuria, hematuria, rash, seizure activity, orthopnea, PND, pedal edema, claudication. Remaining systems are negative.  Physical Exam: Well-developed well-nourished in no acute distress.  Skin  is warm and dry.  HEENT is normal.  Neck is supple.  Chest is clear to auscultation with normal expansion.  Cardiovascular exam is regular rate and rhythm. 2/6 systolic murmur Abdominal exam nontender or distended. No masses palpated. Extremities show no edema. neuro grossly intact  ECG-sinus bradycardia at a rate of 57, no ST changes.  Personally reviewed  A/P  1 coronary artery disease-no recurrent chest pain.  Continue aspirin and statin.  2 hypertension-blood pressure controlled.  Continue present medications.  3 hyperlipidemia-continue statin.  4 Aortic stenosis/aortic insufficiency-mild on most recent echocardiogram.  We will plan follow-up echoes in the future.  5 history of pulmonary nodules-follow-up noncontrast chest CT August 2021.  Kirk Ruths, MD

## 2019-05-16 ENCOUNTER — Telehealth: Payer: Self-pay | Admitting: *Deleted

## 2019-05-16 NOTE — Telephone Encounter (Signed)
   [facility] Pre-operative Risk Assessment    Request for surgical clearance:  1. What type of surgery is being performed? Right knee arthroscopy, partial medial and lateral meniscectomies    2. When is this surgery scheduled? TBD  3. What type of clearance is required (medical clearance vs. Pharmacy clearance to hold med vs. Both)? medical  4. Are there any medications that need to be held prior to surgery and how long? none  5. Practice name and name of physician performing surgery? [facility]  6. What is your office phone number [phone]    7.   What is your office fax number [phone] sherry willis  8.   Anesthesia type (None, local, MAC, general) ? choice   Fredia Beets 05/16/2019, 11:22 AM  _________________________________________________________________   (provider comments below)

## 2019-05-16 NOTE — Telephone Encounter (Signed)
Pt has appt with Dr. Stanford Breed 05/22/19 for pre op clearance. I will remove from the pre op call back pool.

## 2019-05-16 NOTE — Telephone Encounter (Signed)
   Primary Cardiologist:Brian Stanford Breed, MD  Chart reviewed as part of pre-operative protocol coverage. Patient already has an appointment with Dr. Stanford Breed scheduled for 05/22/2019 so peri-operative risk can be assessed at that time.  Pre-op covering staff: - Please contact requesting surgeon's office via preferred method (i.e, phone, fax) to inform them of appointment prior to surgery.   Darreld Mclean, PA-C  05/16/2019, 11:51 AM

## 2019-05-22 ENCOUNTER — Other Ambulatory Visit: Payer: Self-pay

## 2019-05-22 ENCOUNTER — Ambulatory Visit (INDEPENDENT_AMBULATORY_CARE_PROVIDER_SITE_OTHER): Payer: Medicare Other | Admitting: Cardiology

## 2019-05-22 ENCOUNTER — Encounter: Payer: Self-pay | Admitting: Cardiology

## 2019-05-22 VITALS — BP 106/68 | HR 57 | Ht 65.0 in | Wt 148.2 lb

## 2019-05-22 DIAGNOSIS — E785 Hyperlipidemia, unspecified: Secondary | ICD-10-CM | POA: Diagnosis not present

## 2019-05-22 DIAGNOSIS — I712 Thoracic aortic aneurysm, without rupture, unspecified: Secondary | ICD-10-CM

## 2019-05-22 DIAGNOSIS — I2510 Atherosclerotic heart disease of native coronary artery without angina pectoris: Secondary | ICD-10-CM | POA: Diagnosis not present

## 2019-05-22 DIAGNOSIS — I10 Essential (primary) hypertension: Secondary | ICD-10-CM | POA: Diagnosis not present

## 2019-05-22 NOTE — Patient Instructions (Signed)

## 2019-05-24 DIAGNOSIS — M948X6 Other specified disorders of cartilage, lower leg: Secondary | ICD-10-CM | POA: Diagnosis not present

## 2019-05-24 DIAGNOSIS — Y999 Unspecified external cause status: Secondary | ICD-10-CM | POA: Diagnosis not present

## 2019-05-24 DIAGNOSIS — S83241A Other tear of medial meniscus, current injury, right knee, initial encounter: Secondary | ICD-10-CM | POA: Diagnosis not present

## 2019-05-24 DIAGNOSIS — M94261 Chondromalacia, right knee: Secondary | ICD-10-CM | POA: Diagnosis not present

## 2019-05-24 DIAGNOSIS — X58XXXA Exposure to other specified factors, initial encounter: Secondary | ICD-10-CM | POA: Diagnosis not present

## 2019-05-24 DIAGNOSIS — S83281A Other tear of lateral meniscus, current injury, right knee, initial encounter: Secondary | ICD-10-CM | POA: Diagnosis not present

## 2019-06-13 ENCOUNTER — Telehealth: Payer: Self-pay

## 2019-06-13 NOTE — Telephone Encounter (Signed)
ATORVASTATIN 80 MG TABLET [id number].  [place], [place] [address] PH: [phone] FAX: [phone] Patient requests new RX per pharmacist

## 2019-06-14 DIAGNOSIS — Z4789 Encounter for other orthopedic aftercare: Secondary | ICD-10-CM | POA: Diagnosis not present

## 2019-06-15 ENCOUNTER — Telehealth: Payer: Self-pay | Admitting: Cardiology

## 2019-06-15 ENCOUNTER — Other Ambulatory Visit: Payer: Self-pay | Admitting: *Deleted

## 2019-06-15 MED ORDER — ATORVASTATIN CALCIUM 80 MG PO TABS: 80.0000 mg | ORAL_TABLET | Freq: Every day | ORAL | 1 refills | Status: DC

## 2019-06-15 NOTE — Telephone Encounter (Signed)
Refill sent to [facility]

## 2019-06-15 NOTE — Telephone Encounter (Signed)
ATORVASTATIN 80 MG TABLET  Patients requests new RX: Pt needs script here in [place], [place].

## 2019-06-19 DIAGNOSIS — Z4789 Encounter for other orthopedic aftercare: Secondary | ICD-10-CM | POA: Diagnosis not present

## 2019-06-21 DIAGNOSIS — Z4789 Encounter for other orthopedic aftercare: Secondary | ICD-10-CM | POA: Diagnosis not present

## 2019-06-26 DIAGNOSIS — Z4789 Encounter for other orthopedic aftercare: Secondary | ICD-10-CM | POA: Diagnosis not present

## 2019-06-28 DIAGNOSIS — Z4789 Encounter for other orthopedic aftercare: Secondary | ICD-10-CM | POA: Diagnosis not present

## 2019-07-03 DIAGNOSIS — Z4789 Encounter for other orthopedic aftercare: Secondary | ICD-10-CM | POA: Diagnosis not present

## 2019-07-05 DIAGNOSIS — Z4789 Encounter for other orthopedic aftercare: Secondary | ICD-10-CM | POA: Diagnosis not present

## 2019-07-10 DIAGNOSIS — Z4789 Encounter for other orthopedic aftercare: Secondary | ICD-10-CM | POA: Diagnosis not present

## 2019-07-12 DIAGNOSIS — Z4789 Encounter for other orthopedic aftercare: Secondary | ICD-10-CM | POA: Diagnosis not present

## 2019-07-12 DIAGNOSIS — M25561 Pain in right knee: Secondary | ICD-10-CM | POA: Diagnosis not present

## 2019-07-12 DIAGNOSIS — R269 Unspecified abnormalities of gait and mobility: Secondary | ICD-10-CM | POA: Diagnosis not present

## 2019-07-17 DIAGNOSIS — M25561 Pain in right knee: Secondary | ICD-10-CM | POA: Diagnosis not present

## 2019-07-17 DIAGNOSIS — R269 Unspecified abnormalities of gait and mobility: Secondary | ICD-10-CM | POA: Diagnosis not present

## 2019-07-17 DIAGNOSIS — Z4789 Encounter for other orthopedic aftercare: Secondary | ICD-10-CM | POA: Diagnosis not present

## 2019-08-01 ENCOUNTER — Other Ambulatory Visit: Payer: Medicare Other

## 2019-08-07 DIAGNOSIS — M545 Low back pain: Secondary | ICD-10-CM | POA: Diagnosis not present

## 2019-08-07 DIAGNOSIS — M533 Sacrococcygeal disorders, not elsewhere classified: Secondary | ICD-10-CM | POA: Diagnosis not present

## 2019-08-11 ENCOUNTER — Other Ambulatory Visit: Payer: Self-pay | Admitting: Cardiology

## 2019-08-13 ENCOUNTER — Other Ambulatory Visit: Payer: Self-pay | Admitting: Orthopedic Surgery

## 2019-08-13 DIAGNOSIS — M259 Joint disorder, unspecified: Secondary | ICD-10-CM

## 2019-08-13 NOTE — Telephone Encounter (Signed)
Rx has been sent to the pharmacy electronically. ° °

## 2019-08-22 DIAGNOSIS — M533 Sacrococcygeal disorders, not elsewhere classified: Secondary | ICD-10-CM | POA: Diagnosis not present

## 2019-08-28 ENCOUNTER — Other Ambulatory Visit: Payer: Medicare Other

## 2019-09-03 DIAGNOSIS — M545 Low back pain: Secondary | ICD-10-CM | POA: Diagnosis not present

## 2019-09-04 ENCOUNTER — Other Ambulatory Visit: Payer: Self-pay | Admitting: Orthopedic Surgery

## 2019-09-04 ENCOUNTER — Other Ambulatory Visit (HOSPITAL_COMMUNITY): Payer: Self-pay | Admitting: Orthopedic Surgery

## 2019-09-04 DIAGNOSIS — M545 Low back pain, unspecified: Secondary | ICD-10-CM

## 2019-09-04 DIAGNOSIS — M5116 Intervertebral disc disorders with radiculopathy, lumbar region: Secondary | ICD-10-CM | POA: Diagnosis not present

## 2019-09-04 DIAGNOSIS — M533 Sacrococcygeal disorders, not elsewhere classified: Secondary | ICD-10-CM | POA: Diagnosis not present

## 2019-09-13 ENCOUNTER — Encounter (HOSPITAL_COMMUNITY)
Admission: RE | Admit: 2019-09-13 | Discharge: 2019-09-13 | Disposition: A | Discharge status: Home / Self Care | Payer: Medicare Other | Source: Ambulatory Visit | Attending: Orthopedic Surgery | Admitting: Orthopedic Surgery

## 2019-09-13 ENCOUNTER — Other Ambulatory Visit: Payer: Self-pay

## 2019-09-13 DIAGNOSIS — M545 Low back pain, unspecified: Secondary | ICD-10-CM

## 2019-09-13 MED ORDER — TECHNETIUM TC 99M MEDRONATE IV KIT
20.3000 | PACK | Freq: Once | INTRAVENOUS | Status: AC
  Administered 2019-09-13: 20.3 via INTRAVENOUS

## 2019-09-18 ENCOUNTER — Ambulatory Visit
Admission: RE | Admit: 2019-09-18 | Discharge: 2019-09-18 | Disposition: A | Discharge status: Home / Self Care | Payer: Medicare Other | Source: Ambulatory Visit | Attending: Orthopedic Surgery | Admitting: Orthopedic Surgery

## 2019-09-18 DIAGNOSIS — D2262 Melanocytic nevi of left upper limb, including shoulder: Secondary | ICD-10-CM | POA: Diagnosis not present

## 2019-09-18 DIAGNOSIS — D485 Neoplasm of uncertain behavior of skin: Secondary | ICD-10-CM | POA: Diagnosis not present

## 2019-09-18 DIAGNOSIS — L814 Other melanin hyperpigmentation: Secondary | ICD-10-CM | POA: Diagnosis not present

## 2019-09-18 DIAGNOSIS — Z87891 Personal history of nicotine dependence: Secondary | ICD-10-CM | POA: Diagnosis not present

## 2019-09-18 DIAGNOSIS — M545 Low back pain, unspecified: Secondary | ICD-10-CM

## 2019-09-18 DIAGNOSIS — L821 Other seborrheic keratosis: Secondary | ICD-10-CM | POA: Diagnosis not present

## 2019-09-25 DIAGNOSIS — M5116 Intervertebral disc disorders with radiculopathy, lumbar region: Secondary | ICD-10-CM | POA: Diagnosis not present

## 2019-09-25 DIAGNOSIS — M533 Sacrococcygeal disorders, not elsewhere classified: Secondary | ICD-10-CM | POA: Diagnosis not present

## 2019-10-03 ENCOUNTER — Ambulatory Visit
Admission: RE | Admit: 2019-10-03 | Discharge: 2019-10-03 | Disposition: A | Discharge status: Home / Self Care | Payer: Medicare Other | Source: Ambulatory Visit | Attending: Obstetrics | Admitting: Obstetrics

## 2019-10-03 ENCOUNTER — Other Ambulatory Visit: Payer: Self-pay

## 2019-10-03 DIAGNOSIS — M8580 Other specified disorders of bone density and structure, unspecified site: Secondary | ICD-10-CM

## 2019-10-24 ENCOUNTER — Encounter: Payer: Self-pay | Admitting: *Deleted

## 2019-10-24 DIAGNOSIS — I712 Thoracic aortic aneurysm, without rupture, unspecified: Secondary | ICD-10-CM

## 2019-11-26 ENCOUNTER — Telehealth: Payer: Self-pay | Admitting: Family Medicine

## 2019-11-26 NOTE — Telephone Encounter (Signed)
Pt stated she got the pzifer vaccine and she is wondering if she needs the booster?   Pt can be reached at [phone]

## 2019-11-26 NOTE — Telephone Encounter (Signed)
Not yet, currently this is approved only for severely immunocompromised patients such as those with leukemia or organ transplants

## 2019-11-29 ENCOUNTER — Ambulatory Visit
Admission: RE | Admit: 2019-11-29 | Discharge: 2019-11-29 | Disposition: A | Discharge status: Home / Self Care | Payer: Medicare Other | Source: Ambulatory Visit | Attending: Cardiology | Admitting: Cardiology

## 2019-11-29 ENCOUNTER — Other Ambulatory Visit: Payer: Self-pay

## 2019-11-29 DIAGNOSIS — I712 Thoracic aortic aneurysm, without rupture, unspecified: Secondary | ICD-10-CM

## 2019-11-29 DIAGNOSIS — R918 Other nonspecific abnormal finding of lung field: Secondary | ICD-10-CM | POA: Diagnosis not present

## 2019-11-29 MED ORDER — IOPAMIDOL (ISOVUE-370) INJECTION 76%
75.0000 mL | Freq: Once | INTRAVENOUS | Status: AC | PRN
  Administered 2019-11-29: 75 mL via INTRAVENOUS

## 2019-12-10 ENCOUNTER — Other Ambulatory Visit: Payer: Self-pay

## 2019-12-10 MED ORDER — ATORVASTATIN CALCIUM 80 MG PO TABS: 80.0000 mg | ORAL_TABLET | Freq: Every day | ORAL | 2 refills | Status: DC

## 2019-12-12 ENCOUNTER — Other Ambulatory Visit: Payer: Self-pay

## 2019-12-12 ENCOUNTER — Telehealth (INDEPENDENT_AMBULATORY_CARE_PROVIDER_SITE_OTHER): Payer: Medicare Other | Admitting: Family Medicine

## 2019-12-12 ENCOUNTER — Other Ambulatory Visit: Payer: Medicare Other

## 2019-12-12 ENCOUNTER — Encounter: Payer: Self-pay | Admitting: Family Medicine

## 2019-12-12 DIAGNOSIS — J069 Acute upper respiratory infection, unspecified: Secondary | ICD-10-CM | POA: Diagnosis not present

## 2019-12-12 DIAGNOSIS — Z20822 Contact with and (suspected) exposure to covid-19: Secondary | ICD-10-CM

## 2019-12-12 DIAGNOSIS — I2510 Atherosclerotic heart disease of native coronary artery without angina pectoris: Secondary | ICD-10-CM | POA: Diagnosis not present

## 2019-12-12 NOTE — Progress Notes (Signed)
Subjective:    Patient ID: Anne Martin, female    DOB: October 16, 1945, 74 y.o.   MRN: [id number]  HPI Virtual Visit via Video Note  I connected with the patient on 12/12/19 at  1:00 PM EDT by a video enabled telemedicine application and verified that I am speaking with the correct person using two identifiers.  Location patient: home Location provider:work or home office Persons participating in the virtual visit: patient, provider  I discussed the limitations of evaluation and management by telemedicine and the availability of in person appointments. The patient expressed understanding and agreed to proceed.   HPI: Here for 5 days of sinus congestion, mild headache, ST, and swollen nodes in the neck. No fever or cough or SOB or body aches or NVD. No loss of taste or smell. Using Ibuprofen and drinking fluids. She took a home rapid test for the Covid virus yesterday and this came back negative. She then took a PCR test this morning which is pending. Her husband feels fine.    ROS: See pertinent positives and negatives per HPI.  Past Medical History:  Diagnosis Date  . Allergy    seasonal  . Anemia    prior to hysterectomy, fibroids  . Aortic insufficiency    a. 10/2015 Echo: mild to moderate AI.  Marland Kitchen Aortic stenosis    a. 10/2015 Echo: moderate AS.  Marland Kitchen Blood transfusion without reported diagnosis    with hysterectomy  . CAD (coronary artery disease)    a. 08/2009 s/p cath after abnormal nuclear study; LM nl, LAD 60%, D2 90% (PCI/DES), LCX 50 -60%, RCA nl, nl EF;  b. 2012 MV: no ischemia/infarct;  c. 08/2015 MV: no ischemia/infarct, EF 70%.  . Diastolic dysfunction    a. 10/2015 Echo: EF 55-60%, no rwma, Gr1 DD, mod AS, mild to mod AI.  Marland Kitchen Essential hypertension   . H/O: rheumatic fever   . History of colonoscopy   . Hyperlipidemia   . Osteopenia    last DEXA 11-22-13     Past Surgical History:  Procedure Laterality Date  . ANGIOPLASTY  may 2011   stents  . AORTIC ARCH  ANGIOGRAPHY N/A 03/29/2018   Procedure: AORTIC ARCH ANGIOGRAPHY;  Surgeon: Nelva Bush, MD;  Location: [facility];  Service: Cardiovascular;  Laterality: N/A;  . CHOLECYSTECTOMY  nov 2004  . COLONOSCOPY  02/02/2016   per Dr. Henrene Pastor, adenomatous polyps, repeat in 5 yrs   . INTRAVASCULAR PRESSURE WIRE/FFR STUDY N/A 03/29/2018   Procedure: INTRAVASCULAR PRESSURE WIRE/FFR STUDY;  Surgeon: Nelva Bush, MD;  Location: [facility];  Service: Cardiovascular;  Laterality: N/A;  . KNEE ARTHROSCOPY     Rt. Knee  . LEFT HEART CATH AND CORONARY ANGIOGRAPHY N/A 03/29/2018   Procedure: LEFT HEART CATH AND CORONARY ANGIOGRAPHY;  Surgeon: Nelva Bush, MD;  Location: [facility];  Service: Cardiovascular;  Laterality: N/A;  . TONSILLECTOMY    . VAGINAL HYSTERECTOMY  1994    Family History  Problem Relation Age of Onset  . Heart disease Mother   . Heart disease Father        CABG  . Heart disease Brother        2 stents  . Ulcerative colitis Daughter   . Colon cancer Neg Hx      Current Outpatient Medications:  .  aspirin 81 MG tablet, Take 81 mg by mouth daily.  , Disp: , Rfl:  .  atorvastatin (LIPITOR) 80 MG tablet, Take  1 tablet (80 mg total) by mouth daily at 6 PM., Disp: 90 tablet, Rfl: 2 .  Calcium Carbonate (CALCIUM 600) 1500 MG TABS, Take 600 mg of elemental calcium by mouth daily. , Disp: , Rfl:  .  cholecalciferol (VITAMIN D) 1000 UNITS tablet, Take 1,000 Units by mouth every evening. , Disp: , Rfl:  .  isosorbide mononitrate (IMDUR) 30 MG 24 hr tablet, TAKE 1 TABLET BY MOUTH EVERY DAY, Disp: 90 tablet, Rfl: 2 .  Loratadine (CLARITIN) 10 MG CAPS, Take 10 mg by mouth daily. 24 hr tabs, Disp: , Rfl:  .  meloxicam (MOBIC) 15 MG tablet, Take 1 tablet (15 mg total) by mouth daily., Disp: 90 tablet, Rfl: 3 .  metoprolol succinate (TOPROL-XL) 25 MG 24 hr tablet, Take 1 tablet (25 mg total) by mouth daily., Disp: 90 tablet, Rfl: 2 .  nitroGLYCERIN (NITROSTAT)  0.4 MG SL tablet, Place 1 tablet (0.4 mg total) under the tongue every 5 (five) minutes as needed. MAX 3 doses, Disp: 25 tablet, Rfl: 3 .  Probiotic Product (PROBIOTIC PO), Take 1 tablet by mouth daily. , Disp: , Rfl:  .  vitamin C (ASCORBIC ACID) 500 MG tablet, Take 500 mg by mouth at bedtime., Disp: , Rfl:  .  YUVAFEM 10 MCG TABS vaginal tablet, Place 10 mcg vaginally 2 (two) times a week. On Tuesday and Friday, Disp: , Rfl: 1  EXAM:  VITALS per patient if applicable:  GENERAL: alert, oriented, appears well and in no acute distress  HEENT: atraumatic, conjunttiva clear, no obvious abnormalities on inspection of external nose and ears  NECK: normal movements of the head and neck  LUNGS: on inspection no signs of respiratory distress, breathing rate appears normal, no obvious gross SOB, gasping or wheezing  CV: no obvious cyanosis  MS: moves all visible extremities without noticeable abnormality  PSYCH/NEURO: pleasant and cooperative, no obvious depression or anxiety, speech and thought processing grossly intact  ASSESSMENT AND PLAN: Viral URI. She can add Mucinex to help with congestion. It the Covid test from this morning returns as positive, she will need to quarantine for 10 days.  Alysia Penna, MD  Discussed the following assessment and plan:  No diagnosis found.     I discussed the assessment and treatment plan with the patient. The patient was provided an opportunity to ask questions and all were answered. The patient agreed with the plan and demonstrated an understanding of the instructions.   The patient was advised to call back or seek an in-person evaluation if the symptoms worsen or if the condition fails to improve as anticipated.     Review of Systems     Objective:   Physical Exam        Assessment & Plan:

## 2019-12-13 NOTE — Progress Notes (Signed)
HPI: FU CAD. Cardiac catheterization was performed in May of 2011. This revealed Normal LM, 60 LAD, 90 D2, 50-60 LCx, normal RCA, and nl LV function. Patient had PCI of the diagonal with DES at that time. Carotid Dopplers in Oct 2013 were normal.Cardiac catheterization December 2019 showed nonobstructive coronary disease with moderate mid LAD and moderate in-stent restenosis in second diagonal and no disease in the circumflex or right coronary artery. LV function was normal with normal LV filling pressures. The ascending aorta was mildly dilated.Echocardiogram August 2020 showed normal LV function, mild aortic insufficiency, mild aortic stenosis with mean gradient 12 mmHg. CTA August 2021 showed no evidence of thoracic aortic aneurysm.  Scattered pulmonary nodules unchanged and no follow-up recommended.  Since I last saw her,  she denies dyspnea, chest pain, palpitations or syncope.  Current Outpatient Medications  Medication Sig Dispense Refill  . aspirin 81 MG tablet Take 81 mg by mouth daily.      Marland Kitchen atorvastatin (LIPITOR) 80 MG tablet Take 1 tablet (80 mg total) by mouth daily at 6 PM. 90 tablet 2  . Calcium Carbonate (CALCIUM 600) 1500 MG TABS Take 600 mg of elemental calcium by mouth daily.     . cholecalciferol (VITAMIN D) 1000 UNITS tablet Take 1,000 Units by mouth every evening.     . isosorbide mononitrate (IMDUR) 30 MG 24 hr tablet TAKE 1 TABLET BY MOUTH EVERY DAY 90 tablet 2  . Loratadine (CLARITIN) 10 MG CAPS Take 10 mg by mouth daily. 24 hr tabs    . metoprolol succinate (TOPROL-XL) 25 MG 24 hr tablet TAKE 1 TABLET BY MOUTH EVERY DAY 90 tablet 2  . nitroGLYCERIN (NITROSTAT) 0.4 MG SL tablet Place 1 tablet (0.4 mg total) under the tongue every 5 (five) minutes as needed. MAX 3 doses 25 tablet 3  . Probiotic Product (PROBIOTIC PO) Take 1 tablet by mouth daily.     . vitamin C (ASCORBIC ACID) 500 MG tablet Take 500 mg by mouth at bedtime.    Merril Abbe 10 MCG TABS vaginal tablet  Place 10 mcg vaginally 2 (two) times a week. On Tuesday and Friday  1  . gabapentin (NEURONTIN) 300 MG capsule SMARTSIG:1 Capsule(s) By Mouth Every Evening     No current facility-administered medications for this visit.     Past Medical History:  Diagnosis Date  . Allergy    seasonal  . Anemia    prior to hysterectomy, fibroids  . Aortic insufficiency    a. 10/2015 Echo: mild to moderate AI.  Marland Kitchen Aortic stenosis    a. 10/2015 Echo: moderate AS.  Marland Kitchen Blood transfusion without reported diagnosis    with hysterectomy  . CAD (coronary artery disease)    a. 08/2009 s/p cath after abnormal nuclear study; LM nl, LAD 60%, D2 90% (PCI/DES), LCX 50 -60%, RCA nl, nl EF;  b. 2012 MV: no ischemia/infarct;  c. 08/2015 MV: no ischemia/infarct, EF 70%.  . Diastolic dysfunction    a. 10/2015 Echo: EF 55-60%, no rwma, Gr1 DD, mod AS, mild to mod AI.  Marland Kitchen Essential hypertension   . H/O: rheumatic fever   . History of colonoscopy   . Hyperlipidemia   . Osteopenia    last DEXA 11-22-13     Past Surgical History:  Procedure Laterality Date  . ANGIOPLASTY  may 2011   stents  . AORTIC ARCH ANGIOGRAPHY N/A 03/29/2018   Procedure: AORTIC ARCH ANGIOGRAPHY;  Surgeon: Nelva Bush, MD;  Location: [facility];  Service: Cardiovascular;  Laterality: N/A;  . CHOLECYSTECTOMY  nov 2004  . COLONOSCOPY  02/02/2016   per Dr. Henrene Pastor, adenomatous polyps, repeat in 5 yrs   . INTRAVASCULAR PRESSURE WIRE/FFR STUDY N/A 03/29/2018   Procedure: INTRAVASCULAR PRESSURE WIRE/FFR STUDY;  Surgeon: Nelva Bush, MD;  Location: [facility];  Service: Cardiovascular;  Laterality: N/A;  . KNEE ARTHROSCOPY     Rt. Knee  . LEFT HEART CATH AND CORONARY ANGIOGRAPHY N/A 03/29/2018   Procedure: LEFT HEART CATH AND CORONARY ANGIOGRAPHY;  Surgeon: Nelva Bush, MD;  Location: [facility];  Service: Cardiovascular;  Laterality: N/A;  . TONSILLECTOMY    . VAGINAL HYSTERECTOMY  1994    Social History    Socioeconomic History  . Marital status: Married    Spouse name: Not on file  . Number of children: 2  . Years of education: Not on file  . Highest education level: Not on file  Occupational History  . Occupation: Retired Product manager: RETIRED  Tobacco Use  . Smoking status: Former Smoker    Types: Cigarettes    Quit date: 04/12/1977    Years since quitting: 42.7  . Smokeless tobacco: Never Used  . Tobacco comment: quit age 42  Vaping Use  . Vaping Use: Never used  Substance and Sexual Activity  . Alcohol use: Yes    Alcohol/week: 0.0 standard drinks    Comment: rarely  . Drug use: No  . Sexual activity: Not Currently  Other Topics Concern  . Not on file  Social History Narrative  . Not on file   Social Determinants of Health   Financial Resource Strain:   . Difficulty of Paying Living Expenses: Not on file  Food Insecurity:   . Worried About Charity fundraiser in the Last Year: Not on file  . Ran Out of Food in the Last Year: Not on file  Transportation Needs:   . Lack of Transportation (Medical): Not on file  . Lack of Transportation (Non-Medical): Not on file  Physical Activity:   . Days of Exercise per Week: Not on file  . Minutes of Exercise per Session: Not on file  Stress:   . Feeling of Stress : Not on file  Social Connections:   . Frequency of Communication with Friends and Family: Not on file  . Frequency of Social Gatherings with Friends and Family: Not on file  . Attends Religious Services: Not on file  . Active Member of Clubs or Organizations: Not on file  . Attends Archivist Meetings: Not on file  . Marital Status: Not on file  Intimate Partner Violence:   . Fear of Current or Ex-Partner: Not on file  . Emotionally Abused: Not on file  . Physically Abused: Not on file  . Sexually Abused: Not on file    Family History  Problem Relation Age of Onset  . Heart disease Mother   . Heart disease Father        CABG  . Heart  disease Brother        2 stents  . Ulcerative colitis Daughter   . Colon cancer Neg Hx     ROS: no fevers or chills, productive cough, hemoptysis, dysphasia, odynophagia, melena, hematochezia, dysuria, hematuria, rash, seizure activity, orthopnea, PND, pedal edema, claudication. Remaining systems are negative.  Physical Exam: Well-developed well-nourished in no acute distress.  Skin is warm and dry.  HEENT is normal.  Neck  is supple.  Chest is clear to auscultation with normal expansion.  Cardiovascular exam is regular rate and rhythm.  2/6 systolic murmur. Abdominal exam nontender or distended. No masses palpated. Extremities show no edema. neuro grossly intact  A/P  1 coronary artery disease-patient denies recurrent chest pain.  Plan to continue medical therapy with aspirin and statin.  2 Hypertension-patient's blood pressure is controlled.  Continue current medical regimen.  3 hyperlipidemia-continue statin. Check lipids and liver  4 Aortic stenosis/aortic insufficiency-we will plan follow-up echocardiogram.  5 history of dilated aorta on echocardiogram/pulmonary nodules-no thoracic aortic aneurysm noted on recent CTA and pulmonary nodules unchanged (no follow-up recommended).  Kirk Ruths, MD

## 2019-12-14 LAB — NOVEL CORONAVIRUS, NAA: SARS-CoV-2, NAA: NOT DETECTED

## 2019-12-16 ENCOUNTER — Other Ambulatory Visit: Payer: Self-pay | Admitting: Cardiology

## 2019-12-18 DIAGNOSIS — M533 Sacrococcygeal disorders, not elsewhere classified: Secondary | ICD-10-CM | POA: Diagnosis not present

## 2019-12-18 DIAGNOSIS — M5116 Intervertebral disc disorders with radiculopathy, lumbar region: Secondary | ICD-10-CM | POA: Diagnosis not present

## 2019-12-19 ENCOUNTER — Other Ambulatory Visit: Payer: Self-pay | Admitting: Orthopedic Surgery

## 2019-12-19 DIAGNOSIS — M533 Sacrococcygeal disorders, not elsewhere classified: Secondary | ICD-10-CM

## 2019-12-20 ENCOUNTER — Ambulatory Visit
Admission: RE | Admit: 2019-12-20 | Discharge: 2019-12-20 | Disposition: A | Discharge status: Home / Self Care | Payer: Medicare Other | Source: Ambulatory Visit | Attending: Orthopedic Surgery | Admitting: Orthopedic Surgery

## 2019-12-20 ENCOUNTER — Other Ambulatory Visit: Payer: Self-pay

## 2019-12-20 DIAGNOSIS — M5116 Intervertebral disc disorders with radiculopathy, lumbar region: Secondary | ICD-10-CM | POA: Diagnosis not present

## 2019-12-20 DIAGNOSIS — M533 Sacrococcygeal disorders, not elsewhere classified: Secondary | ICD-10-CM

## 2019-12-20 MED ORDER — METHYLPREDNISOLONE ACETATE 40 MG/ML INJ SUSP (RADIOLOG
120.0000 mg | Freq: Once | INTRAMUSCULAR | Status: AC
  Administered 2019-12-20: 120 mg via EPIDURAL

## 2019-12-20 MED ORDER — IOPAMIDOL (ISOVUE-M 200) INJECTION 41%
1.0000 mL | Freq: Once | INTRAMUSCULAR | Status: AC
  Administered 2019-12-20: 1 mL via EPIDURAL

## 2019-12-20 NOTE — Discharge Instructions (Signed)

## 2019-12-24 ENCOUNTER — Encounter: Payer: Self-pay | Admitting: Cardiology

## 2019-12-24 ENCOUNTER — Ambulatory Visit (INDEPENDENT_AMBULATORY_CARE_PROVIDER_SITE_OTHER): Payer: Medicare Other | Admitting: Cardiology

## 2019-12-24 ENCOUNTER — Telehealth: Payer: Self-pay | Admitting: Family Medicine

## 2019-12-24 ENCOUNTER — Encounter: Payer: Self-pay | Admitting: Family Medicine

## 2019-12-24 ENCOUNTER — Other Ambulatory Visit: Payer: Self-pay

## 2019-12-24 VITALS — BP 134/76 | HR 71 | Ht 65.0 in | Wt 152.8 lb

## 2019-12-24 DIAGNOSIS — E7800 Pure hypercholesterolemia, unspecified: Secondary | ICD-10-CM | POA: Diagnosis not present

## 2019-12-24 DIAGNOSIS — I2510 Atherosclerotic heart disease of native coronary artery without angina pectoris: Secondary | ICD-10-CM | POA: Diagnosis not present

## 2019-12-24 DIAGNOSIS — I350 Nonrheumatic aortic (valve) stenosis: Secondary | ICD-10-CM | POA: Diagnosis not present

## 2019-12-24 DIAGNOSIS — M533 Sacrococcygeal disorders, not elsewhere classified: Secondary | ICD-10-CM | POA: Diagnosis not present

## 2019-12-24 DIAGNOSIS — I10 Essential (primary) hypertension: Secondary | ICD-10-CM | POA: Diagnosis not present

## 2019-12-24 DIAGNOSIS — M5116 Intervertebral disc disorders with radiculopathy, lumbar region: Secondary | ICD-10-CM | POA: Diagnosis not present

## 2019-12-24 LAB — HEPATIC FUNCTION PANEL
ALT: 24 IU/L (ref 0–32)
AST: 26 IU/L (ref 0–40)
Albumin: 4.9 g/dL — ABNORMAL HIGH (ref 3.7–4.7)
Alkaline Phosphatase: 140 IU/L — ABNORMAL HIGH (ref 44–121)
Bilirubin Total: 0.9 mg/dL (ref 0.0–1.2)
Bilirubin, Direct: 0.28 mg/dL (ref 0.00–0.40)
Total Protein: 6.9 g/dL (ref 6.0–8.5)

## 2019-12-24 LAB — LIPID PANEL
Chol/HDL Ratio: 1.6 ratio (ref 0.0–4.4)
Cholesterol, Total: 142 mg/dL (ref 100–199)
HDL: 89 mg/dL (ref >39)
LDL Chol Calc (NIH): 39 mg/dL (ref 0–99)
Triglycerides: 69 mg/dL (ref 0–149)
VLDL Cholesterol Cal: 14 mg/dL (ref 5–40)

## 2019-12-24 NOTE — Telephone Encounter (Signed)
Pt's spouse dropped off a form to be completed. Pt would like a call at [phone] for pick up.  Placed in red folder

## 2019-12-24 NOTE — Patient Instructions (Signed)
Medication Instructions:  Your physician recommends that you continue on your current medications as directed. Please refer to the Current Medication list given to you today.  *If you need a refill on your cardiac medications before your next appointment, please call your pharmacy*   Lab Work: LP/HFP TODY   If you have labs (blood work) drawn today and your tests are completely normal, you will receive your results only by: Marland Kitchen MyChart Message (if you have MyChart) OR . A paper copy in the mail If you have any lab test that is abnormal or we need to change your treatment, we will call you to review the results.   Testing/Procedures: Your physician has requested that you have an echocardiogram. Echocardiography is a painless test that uses sound waves to create images of your heart. It provides your doctor with information about the size and shape of your heart and how well your heart's chambers and valves are working. This procedure takes approximately one hour. There are no restrictions for this procedure. [place] [address]   Follow-Up: At [facility], you and your health needs are our priority.  As part of our continuing mission to provide you with exceptional heart care, we have created designated Provider Care Teams.  These Care Teams include your primary Cardiologist (physician) and Advanced Practice Providers (APPs -  Physician Assistants and Nurse Practitioners) who all work together to provide you with the care you need, when you need it.  We recommend signing up for the patient portal called "MyChart".  Sign up information is provided on this After Visit Summary.  MyChart is used to connect with patients for Virtual Visits (Telemedicine).  Patients are able to view lab/test results, encounter notes, upcoming appointments, etc.  Non-urgent messages can be sent to your provider as well.   To learn more about what you can do with MyChart, go to  [email or web address].    Your next appointment:   12 month(s)  You will receive a reminder letter in the mail two months in advance. If you don't receive a letter, please call our office to schedule the follow-up appointment.  The format for your next appointment:   In Person  Provider:   You may see Kirk Ruths, MD or one of the following Advanced Practice Providers on your designated Care Team:    Kerin Ransom, PA-C  [place], [place]  Coletta Memos, [place]

## 2019-12-25 ENCOUNTER — Telehealth: Payer: Self-pay | Admitting: *Deleted

## 2019-12-25 NOTE — Telephone Encounter (Signed)
Forms are in the red folder for your signature.

## 2019-12-25 NOTE — Telephone Encounter (Signed)
   [facility] Pre-operative Risk Assessment    PRIMARY CARDIOLOGIST:  DR Stanford Breed  Request for surgical clearance:  1. What type of surgery is being performed?  L2-3 DISCECTOMY   2. When is this surgery scheduled? TBD   3. What type of clearance is required (medical clearance vs. Pharmacy clearance to hold med vs. Both)? MEDICAL  4. Are there any medications that need to be held prior to surgery and how long?ASPIRIN 81 MG   5. Practice name and name of physician performing surgery? [facility] ; DR Duane Lope [facility]   6. What is the office phone number? [phone]   7.   What is the office fax number? [phone]  8.   Anesthesia type (None, local, MAC, general) ? GENERAL   Anne Martin 12/25/2019, 12:25 PM  _________________________________________________________________   (Anne Martin comments below)

## 2019-12-26 NOTE — Telephone Encounter (Signed)
The form is ready to fax  °

## 2019-12-26 NOTE — Telephone Encounter (Signed)
Form faxed this am.

## 2019-12-27 NOTE — Telephone Encounter (Signed)
   Primary Cardiologist: Kirk Ruths, MD  Chart reviewed as part of pre-operative protocol coverage  Patient with hx of CAD. Cath 2019 showed nonobstructive coronary disease with moderate mid LAD and moderate in-stent restenosis in second diagonal and no disease in the circumflex or right coronary artery.   Dr. Rosaland Lao, can patient hold ASA  For 5-7 days? She was doing well on cardiac stand point when seen by you 12/24/19.

## 2019-12-28 ENCOUNTER — Ambulatory Visit: Payer: Self-pay | Admitting: Orthopedic Surgery

## 2019-12-31 ENCOUNTER — Ambulatory Visit (HOSPITAL_COMMUNITY): Payer: Medicare Other | Attending: Cardiology

## 2019-12-31 ENCOUNTER — Other Ambulatory Visit: Payer: Self-pay

## 2019-12-31 ENCOUNTER — Ambulatory Visit: Payer: Self-pay | Admitting: Orthopedic Surgery

## 2019-12-31 DIAGNOSIS — I350 Nonrheumatic aortic (valve) stenosis: Secondary | ICD-10-CM | POA: Diagnosis not present

## 2019-12-31 LAB — ECHOCARDIOGRAM COMPLETE
AR max vel: 1.06 cm2
AV Area VTI: 1.13 cm2
AV Area mean vel: 1.13 cm2
AV Mean grad: 8.6 mmHg
AV Peak grad: 15.4 mmHg
Ao pk vel: 1.96 m/s
Area-P 1/2: 2.4 cm2
P 1/2 time: 743 msec
S' Lateral: 2.5 cm

## 2019-12-31 NOTE — Telephone Encounter (Signed)
   Primary Cardiologist: Kirk Ruths, MD  Chart reviewed as part of pre-operative protocol coverage. Patient was contacted 12/31/2019 in reference to pre-operative risk assessment for pending surgery as outlined below.  Anne Martin was last seen on 12/24/19 and felt to be clinically stable.   Per Dr. Stanford Breed, [facility] to hold ASA 7 days prior to procedure and resume after. The patient has a f/u surveillance echocardiogram ordered but Dr. Stanford Breed did not specifically feel this had to be completed prior to clearing for surgery.  I will route this recommendation to the requesting party via Epic fax function and remove from pre-op pool. Please call with questions.  Charlie Pitter, PA-C 12/31/2019, 8:56 AM

## 2019-12-31 NOTE — Progress Notes (Signed)
[id number] - [place], [place] - [address] [place] [address] Phone: [phone] Fax: [phone]  [id number] - [place], [place] [address] [place] [address] Phone: [phone] Fax: [phone]      Your procedure is scheduled on September 23  Report to [facility] Main Entrance "A" at 1:10 P.M., and check in at the Admitting office.  Call this number if you have problems the morning of surgery:  [phone]  Call [phone] if you have any questions prior to your surgery date Monday-Friday 8am-4pm    Remember:  Do not eat or drink after midnight the night before your surgery     Take these medicines the morning of surgery with A SIP OF WATER  gabapentin (NEURONTIN) isosorbide mononitrate (IMDUR) loratadine (CLARITIN) metoprolol succinate (TOPROL-XL) traMADol (ULTRAM) if needed   Follow your surgeon's instructions on when to stop Aspirin.  If no instructions were given by your surgeon then you will need to call the office to get those instructions.    As of today, STOP taking any Aspirin (unless otherwise instructed by your surgeon) Aleve, Naproxen, Ibuprofen, Motrin, Advil, Goody's, BC's, all herbal medications, fish oil, and all vitamins.                      Do not wear jewelry, make up, or nail polish            Do not wear lotions, powders, perfumes/colognes, or deodorant.            Do not shave 48 hours prior to surgery.             Do not bring valuables to the hospital.            [facility] is not responsible for any belongings or valuables.  Do NOT Smoke (Tobacco/Vaping) or drink Alcohol 24 hours prior to your procedure If you use a CPAP at night, you may bring all equipment for your overnight stay.   Contacts, glasses, dentures or bridgework may not be worn into surgery.      For patients admitted to the hospital, discharge time  will be determined by your treatment team.   Patients discharged the day of surgery will not be allowed to drive home, and someone needs to stay with them for 24 hours.    Special instructions:   [facility]- Preparing For Surgery  Before surgery, you can play an important role. Because skin is not sterile, your skin needs to be as free of germs as possible. You can reduce the number of germs on your skin by washing with CHG (chlorahexidine gluconate) Soap before surgery.  CHG is an antiseptic cleaner which kills germs and bonds with the skin to continue killing germs even after washing.    Oral Hygiene is also important to reduce your risk of infection.  Remember - BRUSH YOUR TEETH THE MORNING OF SURGERY WITH YOUR REGULAR TOOTHPASTE  Please do not use if you have an allergy to CHG or antibacterial soaps. If your skin becomes reddened/irritated stop using the CHG.  Do not shave (including legs and underarms) for at least 48 hours prior to first CHG shower. It is OK to shave your face.  Please follow these instructions carefully.   1. Shower the NIGHT BEFORE SURGERY and the MORNING OF SURGERY with CHG Soap.   2.  If you chose to wash your hair, wash your hair first as usual with your normal shampoo.  3. After you shampoo, rinse your hair and body thoroughly to remove the shampoo.  4. Use CHG as you would any other liquid soap. You can apply CHG directly to the skin and wash gently with a scrungie or a clean washcloth.   5. Apply the CHG Soap to your body ONLY FROM THE NECK DOWN.  Do not use on open wounds or open sores. Avoid contact with your eyes, ears, mouth and genitals (private parts). Wash Face and genitals (private parts)  with your normal soap.   6. Wash thoroughly, paying special attention to the area where your surgery will be performed.  7. Thoroughly rinse your body with warm water from the neck down.  8. DO NOT shower/wash with your normal soap after using and rinsing off  the CHG Soap.  9. Pat yourself dry with a CLEAN TOWEL.  10. Wear CLEAN PAJAMAS to bed the night before surgery  11. Place CLEAN SHEETS on your bed the night of your first shower and DO NOT SLEEP WITH PETS.   Day of Surgery: Wear Clean/Comfortable clothing the morning of surgery Do not apply any deodorants/lotions.   Remember to brush your teeth WITH YOUR REGULAR TOOTHPASTE.   Please read over the following fact sheets that you were given.

## 2019-12-31 NOTE — H&P (Deleted)
  The note originally documented on this encounter has been moved the the encounter in which it belongs.  

## 2019-12-31 NOTE — H&P (Signed)
Subjective:   Anne Martin is a pleasant 74 year old female with past medical history significant for a heart stent status post rheumatic Fever, Coronary artery disease who has had ongoing low back and radicular left leg pain due to a large disc herniation. Despite appropriate conservative care including over-the-counter medications and prescription medications including gabapentin and opioids she continues to have severe debilitating pain and she would like to move forward with surgical intervention. The patient is here today for a pre-operative History and Physical. They are scheduled for left Microdiscectomy L2-3 (left) on 01-10-20 with Dr. Rolena Infante at [facility].  Patient Active Problem List   Diagnosis Date Noted  . Unstable angina ([place]) 03/28/2018  . Chest pain 10/28/2016  . Aortic insufficiency 02/29/2016  . Essential hypertension 02/29/2016  . Musculoskeletal chest pain 02/28/2016  . Aortic stenosis 02/24/2012  . Hyperlipidemia 11/07/2009  . CAD (coronary artery disease) 09/15/2009  . CAROTID BRUIT 09/15/2009  . CONTACT DERMATITIS 01/28/2009  . Disorder of bone and cartilage 12/17/2008  . ALLERGIC RHINITIS 08/16/2007  . KNEE PAIN 08/16/2007  . History of cardiovascular disorder 08/16/2007   Past Medical History:  Diagnosis Date  . Allergy    seasonal  . Anemia    prior to hysterectomy, fibroids  . Aortic insufficiency    a. 10/2015 Echo: mild to moderate AI.  Marland Kitchen Aortic stenosis    a. 10/2015 Echo: moderate AS.  Marland Kitchen Blood transfusion without reported diagnosis    with hysterectomy  . CAD (coronary artery disease)    a. 08/2009 s/p cath after abnormal nuclear study; LM nl, LAD 60%, D2 90% (PCI/DES), LCX 50 -60%, RCA nl, nl EF;  b. 2012 MV: no ischemia/infarct;  c. 08/2015 MV: no ischemia/infarct, EF 70%.  . Diastolic dysfunction    a. 10/2015 Echo: EF 55-60%, no rwma, Gr1 DD, mod AS, mild to mod AI.  Marland Kitchen Essential hypertension   . H/O: rheumatic fever   . History of colonoscopy    . Hyperlipidemia   . Osteopenia    last DEXA 11-22-13     Past Surgical History:  Procedure Laterality Date  . ANGIOPLASTY  may 2011   stents  . AORTIC ARCH ANGIOGRAPHY N/A 03/29/2018   Procedure: AORTIC ARCH ANGIOGRAPHY;  Surgeon: Nelva Bush, MD;  Location: [facility];  Service: Cardiovascular;  Laterality: N/A;  . CHOLECYSTECTOMY  nov 2004  . COLONOSCOPY  02/02/2016   per Dr. Henrene Pastor, adenomatous polyps, repeat in 5 yrs   . INTRAVASCULAR PRESSURE WIRE/FFR STUDY N/A 03/29/2018   Procedure: INTRAVASCULAR PRESSURE WIRE/FFR STUDY;  Surgeon: Nelva Bush, MD;  Location: [facility];  Service: Cardiovascular;  Laterality: N/A;  . KNEE ARTHROSCOPY     Rt. Knee  . LEFT HEART CATH AND CORONARY ANGIOGRAPHY N/A 03/29/2018   Procedure: LEFT HEART CATH AND CORONARY ANGIOGRAPHY;  Surgeon: Nelva Bush, MD;  Location: [facility];  Service: Cardiovascular;  Laterality: N/A;  . TONSILLECTOMY    . VAGINAL HYSTERECTOMY  1994    Current Outpatient Medications  Medication Sig Dispense Refill Last Dose  . aspirin 81 MG tablet Take 81 mg by mouth daily.       Marland Kitchen atorvastatin (LIPITOR) 80 MG tablet Take 1 tablet (80 mg total) by mouth daily at 6 PM. 90 tablet 2   . Calcium Carbonate (CALCIUM 600) 1500 MG TABS Take 600 mg of elemental calcium by mouth daily.      . cholecalciferol (VITAMIN D) 1000 UNITS tablet Take 1,000 Units by mouth  every evening.      . gabapentin (NEURONTIN) 300 MG capsule Take 300 mg by mouth 3 (three) times daily.      . isosorbide mononitrate (IMDUR) 30 MG 24 hr tablet TAKE 1 TABLET BY MOUTH EVERY DAY (Patient taking differently: Take 30 mg by mouth daily. ) 90 tablet 2   . loratadine (CLARITIN) 10 MG tablet Take 10 mg by mouth daily.      . metoprolol succinate (TOPROL-XL) 25 MG 24 hr tablet TAKE 1 TABLET BY MOUTH EVERY DAY (Patient taking differently: Take 25 mg by mouth daily. ) 90 tablet 2   . nitroGLYCERIN (NITROSTAT) 0.4 MG SL tablet Place 1  tablet (0.4 mg total) under the tongue every 5 (five) minutes as needed. MAX 3 doses 25 tablet 3   . Probiotic Product (PROBIOTIC PO) Take 1 tablet by mouth daily.      . traMADol (ULTRAM) 50 MG tablet Take 50 mg by mouth every 6 (six) hours as needed for pain.     . vitamin C (ASCORBIC ACID) 500 MG tablet Take 500 mg by mouth at bedtime.     Merril Abbe 10 MCG TABS vaginal tablet Place 10 mcg vaginally 2 (two) times a week.   1    No current facility-administered medications for this visit.   Allergies  Allergen Reactions  . Procaine Hcl Other (See Comments)    Unknown    Social History   Tobacco Use  . Smoking status: Former Smoker    Types: Cigarettes    Quit date: 04/12/1977    Years since quitting: 42.7  . Smokeless tobacco: Never Used  . Tobacco comment: quit age 80  Substance Use Topics  . Alcohol use: Yes    Alcohol/week: 0.0 standard drinks    Comment: rarely    Family History  Problem Relation Age of Onset  . Heart disease Mother   . Heart disease Father        CABG  . Heart disease Brother        2 stents  . Ulcerative colitis Daughter   . Colon cancer Neg Hx     Review of Systems As stated in HPI  Objective:   Vitals: Ht: 5 ft 3.5 in 12/31/2019 09:35 am Wt: 158.5 lbs 12/31/2019 09:35 am BMI: 27.6 12/31/2019 09:35 am BP: 150/83 sitting L arm 12/31/2019 09:37 am Pulse: 77 bpm 12/31/2019 09:37 am T: 98.6 F 12/31/2019 09:36 am  General: AAOX3, well developed and well nourished, NAD Ambulation: normal gait pattern, uses no assistive device. Inspection: No obvious deformity, scoleosis, kyphosis, loss of lordotic curve. Heart: Regular rate and rhythm, no rubs, murmurs or gallops.  Lungs: Clear to auscultation bilaterally  Abdomen: Bowel sounds 4, nontender, nondistended no rebound tenderness. No loss of control of bladder or bowel function.  AROM: -Hip IR/ER: LeftPain free, right Pain free - Knee: flexion and extension normal and pain free  bilaterally. - Ankle: Dorsiflexion, plantarflexion, inversion, eversion normal and pain free.  Dermatomes: Patient describing pain and dysesthesias in the left L2 and 3 dermatome pattern  Myotomes: - Hip flexion and knee extension limited due to patient's severe pain. Remaining lower extremity motor strength 5/5 bilaterally. Clonus: Negative  Special Tests: - Femoral Nerve Stretch Test: Left Positive, Right Negative  PV: Extremities warm and well profused. Posterior and dorsalis pedis pulse 2+ bilaterally, No pitting Edema, discoloration, calf tenderness  Lumbar MRI: completed on 09/03/19 was reviewed with the patient. It was completed at [facility]; I have independently  reviewed the images as well as the radiology report: Signal abnormality noted in the L1 vertebral body suspicious for pathological lesion. Recommended further evaluation with bone scan/CT scan. Large left lateral disc herniation with foraminal extension causing compression of the left exiting L2 and traversing L3 nerve root. Moderate central canal stenosis noted. Mild degenerative changes in the remainder of the lumbar spine. No fracture is seen. Conus is in normal position and appearance.  Bone scan and CT evaluation did not reveal a pathological Lesion.  Assessment:   Patient has progressive debilitating radicular left leg pain in the L2 and L3 dermatome. Imaging studies confirm the large left central disc herniation at L2-3. Given the progressive nature of her radiculopathy and the loss in quality-of-life she would like to move forward with surgery.  I think this is reasonable. We will move forward with a decompression and discectomy. Given the area we will do an L2 laminotomy and central decompression and then work into the left lateral recess to remove the disc herniation. We will try to minimize the trauma to the pars and facet joint to prevent iatrogenic instability. I have gone over the surgical procedure and risks and  benefits with the patient in great detail and all of her questions were encouraged and addressed.  Plan:   Risks and benefits of decompression/discectomy: Infection, bleeding, death, stroke, paralysis, ongoing or worse pain, need for additional surgery, leak of spinal fluid, adjacent segment degeneration requiring additional surgery, post-operative hematoma formation that can result in neurological compromise and the need for urgent/emergent re-operation. Loss in bowel and bladder control. Injury to major vessels that could result in the need for urgent abdominal surgery to stop bleeding. Risk of deep venous thrombosis (DVT) and the need for additional treatment. Recurrent disc herniation resulting in the need for revision surgery, which could include fusion surgery (utilizing instrumentation such as pedicle screws and intervertebral cages).  Additional risk: If instrumentation is used there is a risk of migration, or breakage of that hardware that could require additional surgery.  We have also discussed the post-operative recovery period to include: bathing/showering restrictions, wound healing, activity (and driving) restrictions, medications/pain mangement.  We have also discussed post-operative redflags to include: signs and symptoms of postoperative infection, DVT/PE.  We have obtained preoperative medical clearance from the patient's primary care provider. She is scheduled to get an echo today and we hope to have clearance from her cardiologist following that. Patient states that in the past she has required specific antibiotics due to past history of rheumatic fever. She will talk to her cardiologist about this today.  I did review the patient's medication list with her. She is not on any blood thinners. She does use aspirin Which she will hold 7 days prior to surgery. We will restart after. Recommend she also hold any anti-inflammatory medications. She can continue taking gabapentin, tramadol,  Tylenol as needed for pain.  Patient was fitted with an LSO brace at today's office visit.  All patient's questions were invited and answered  Follow-up: 2 weeks postoperatively

## 2019-12-31 NOTE — Telephone Encounter (Signed)
Ok to hold ASA 7 days prior to procedure and resume after [employer]

## 2020-01-01 ENCOUNTER — Encounter (HOSPITAL_COMMUNITY): Payer: Self-pay

## 2020-01-01 ENCOUNTER — Ambulatory Visit (HOSPITAL_COMMUNITY)
Admission: RE | Admit: 2020-01-01 | Discharge: 2020-01-01 | Disposition: A | Discharge status: Home / Self Care | Payer: Medicare Other | Source: Ambulatory Visit | Attending: Orthopedic Surgery | Admitting: Orthopedic Surgery

## 2020-01-01 ENCOUNTER — Other Ambulatory Visit (HOSPITAL_COMMUNITY)
Admission: RE | Admit: 2020-01-01 | Discharge: 2020-01-01 | Disposition: A | Discharge status: Home / Self Care | Payer: Medicare Other | Source: Ambulatory Visit | Attending: Orthopedic Surgery | Admitting: Orthopedic Surgery

## 2020-01-01 ENCOUNTER — Encounter (HOSPITAL_COMMUNITY)
Admission: RE | Admit: 2020-01-01 | Discharge: 2020-01-01 | Disposition: A | Discharge status: Home / Self Care | Payer: Medicare Other | Source: Ambulatory Visit | Attending: Orthopedic Surgery | Admitting: Orthopedic Surgery

## 2020-01-01 DIAGNOSIS — M5116 Intervertebral disc disorders with radiculopathy, lumbar region: Secondary | ICD-10-CM | POA: Diagnosis not present

## 2020-01-01 DIAGNOSIS — Z01811 Encounter for preprocedural respiratory examination: Secondary | ICD-10-CM

## 2020-01-01 DIAGNOSIS — E785 Hyperlipidemia, unspecified: Secondary | ICD-10-CM | POA: Diagnosis not present

## 2020-01-01 DIAGNOSIS — Z7901 Long term (current) use of anticoagulants: Secondary | ICD-10-CM | POA: Diagnosis not present

## 2020-01-01 DIAGNOSIS — Z01812 Encounter for preprocedural laboratory examination: Secondary | ICD-10-CM | POA: Insufficient documentation

## 2020-01-01 DIAGNOSIS — Z7982 Long term (current) use of aspirin: Secondary | ICD-10-CM | POA: Insufficient documentation

## 2020-01-01 DIAGNOSIS — I2510 Atherosclerotic heart disease of native coronary artery without angina pectoris: Secondary | ICD-10-CM | POA: Insufficient documentation

## 2020-01-01 DIAGNOSIS — Z20822 Contact with and (suspected) exposure to covid-19: Secondary | ICD-10-CM | POA: Diagnosis not present

## 2020-01-01 DIAGNOSIS — Z01818 Encounter for other preprocedural examination: Secondary | ICD-10-CM | POA: Diagnosis not present

## 2020-01-01 DIAGNOSIS — Z87891 Personal history of nicotine dependence: Secondary | ICD-10-CM | POA: Diagnosis not present

## 2020-01-01 DIAGNOSIS — I10 Essential (primary) hypertension: Secondary | ICD-10-CM | POA: Diagnosis not present

## 2020-01-01 HISTORY — DX: Nausea with vomiting, unspecified: R11.2

## 2020-01-01 HISTORY — DX: Other specified postprocedural states: Z98.890

## 2020-01-01 HISTORY — DX: Other complications of anesthesia, initial encounter: T88.59XA

## 2020-01-01 LAB — CBC
HCT: 51.2 % — ABNORMAL HIGH (ref 36.0–46.0)
Hemoglobin: 16.1 g/dL — ABNORMAL HIGH (ref 12.0–15.0)
MCH: 26.8 pg (ref 26.0–34.0)
MCHC: 31.4 g/dL (ref 30.0–36.0)
MCV: 85.3 fL (ref 80.0–100.0)
Platelets: 251 10*3/uL (ref 150–400)
RBC: 6 MIL/uL — ABNORMAL HIGH (ref 3.87–5.11)
RDW: 14.1 % (ref 11.5–15.5)
WBC: 10.1 10*3/uL (ref 4.0–10.5)
nRBC: 0 % (ref 0.0–0.2)

## 2020-01-01 LAB — URINALYSIS, ROUTINE W REFLEX MICROSCOPIC
Bacteria, UA: NONE SEEN
Bilirubin Urine: NEGATIVE
Glucose, UA: NEGATIVE mg/dL
Hgb urine dipstick: NEGATIVE
Ketones, ur: NEGATIVE mg/dL
Nitrite: NEGATIVE
Protein, ur: NEGATIVE mg/dL
Specific Gravity, Urine: 1.028 (ref 1.005–1.030)
pH: 5 (ref 5.0–8.0)

## 2020-01-01 LAB — BASIC METABOLIC PANEL
Anion gap: 12 (ref 5–15)
BUN: 20 mg/dL (ref 8–23)
CO2: 28 mmol/L (ref 22–32)
Calcium: 9.8 mg/dL (ref 8.9–10.3)
Chloride: 101 mmol/L (ref 98–111)
Creatinine, Ser: 0.9 mg/dL (ref 0.44–1.00)
GFR calc Af Amer: >60 mL/min (ref >60)
GFR calc non Af Amer: >60 mL/min (ref >60)
Glucose, Bld: 74 mg/dL (ref 70–99)
Potassium: 3.6 mmol/L (ref 3.5–5.1)
Sodium: 141 mmol/L (ref 135–145)

## 2020-01-01 LAB — SARS CORONAVIRUS 2 (TAT 6-24 HRS): SARS Coronavirus 2: NEGATIVE

## 2020-01-01 LAB — PROTIME-INR
INR: 1 (ref 0.8–1.2)
Prothrombin Time: 13.1 seconds (ref 11.4–15.2)

## 2020-01-01 LAB — SURGICAL PCR SCREEN
MRSA, PCR: NEGATIVE
Staphylococcus aureus: NEGATIVE

## 2020-01-01 LAB — APTT: aPTT: 30 seconds (ref 24–36)

## 2020-01-01 NOTE — Progress Notes (Signed)
PCP - Alysia Penna Cardiologist - Dr. Stanford Breed  Chest x-ray - 01/01/20 EKG - 05/22/19 Stress Test -03/28/18  ECHO - 12/31/19 Cardiac Cath - 03/29/18  Sleep Study - denies CPAP - denies  Blood Thinner Instructions: n/a Aspirin Instructions: LD 12/29/19  COVID TEST- 01/01/20, after PAT appointment.    Anesthesia review: Yes, per Dr. Rolena Infante.   Patient denies shortness of breath, fever, cough and chest pain at PAT appointment   All instructions explained to the patient, with a verbal understanding of the material. Patient agrees to go over the instructions while at home for a better understanding. Patient also instructed to self quarantine after being tested for COVID-19. The opportunity to ask questions was provided.   Coronavirus Screening  Have you experienced the following symptoms:  Cough yes/no: No Fever (>100.62F)  yes/no: No Runny nose yes/no: No Sore throat yes/no: No Difficulty breathing/shortness of breath  yes/no: No  Have you or a family member traveled in the last 14 days and where? yes/no: No   If the patient indicates "YES" to the above questions, their PAT will be rescheduled to limit the exposure to others and, the surgeon will be notified. THE PATIENT WILL NEED TO BE ASYMPTOMATIC FOR 14 DAYS.   If the patient is not experiencing any of these symptoms, the PAT nurse will instruct them to NOT bring anyone with them to their appointment since they may have these symptoms or traveled as well.   Please remind your patients and families that hospital visitation restrictions are in effect and the importance of the restrictions.

## 2020-01-02 NOTE — Progress Notes (Signed)
Anesthesia Chart Review:  Case: [id number] Date/Time: 01/03/20 1458   Procedure: Left L2-3 decompression disectomy (Left ) - 2.5 hrs   Anesthesia type: General   Pre-op diagnosis: Herniated disc, lumbar radiculopathy   Location: [facility] OR ROOM 04 / [facility]   Surgeons: Melina Schools, MD      DISCUSSION: Patient is a 74 year old female scheduled for the above procedure.  History includes former smoker (quit 04/12/77), post-operative N/V, CAD (s/p DES D1 09/02/09; 45% mid LAD, 50% ISR D1, medicl therapy 03/29/18), rheumatic fever, AS/AR (mild 12/13/38), HLD, diastolic dysfunction, HTN.   Preoperative cardiology input outlined on 12/31/2019 by Melina Copa, PA-C, "...OREL HORD was last seen on 12/24/19 and felt to be clinically stable.   Per Dr. Stanford Breed, [facility] to hold ASA 7 days prior to procedure and resume after. The patient has a f/u surveillance echocardiogram ordered but Dr. Stanford Breed did not specifically feel this had to be completed prior to clearing for surgery." Echo was done on 12/31/19 and showed normal LVEF with stable mild AS.  Last aspirin 12/29/19.  921/21 presurgical COVID-19 test negative.  Anesthesia team to evaluate on the day of surgery.   VS: BP 140/82   Pulse 73   Temp 36.4 C (Oral)   Resp 17   Ht 5\' 5"  (1.651 m)   Wt 72.3 kg   LMP  (LMP Unknown)   SpO2 98%   BMI 26.51 kg/m     PROVIDERS: Laurey Morale, MD is PCP  Kirk Ruths, MD is cardiologist   LABS: Labs reviewed: Acceptable for surgery. (all labs ordered are listed, but only abnormal results are displayed)  Labs Reviewed  CBC - Abnormal; Notable for the following components:      Result Value   RBC 6.00 (*)    Hemoglobin 16.1 (*)    HCT 51.2 (*)    All other components within normal limits  URINALYSIS, ROUTINE W REFLEX MICROSCOPIC - Abnormal; Notable for the following components:   Leukocytes,Ua TRACE (*)    All other components within normal limits  SURGICAL PCR SCREEN  APTT  BASIC METABOLIC PANEL   PROTIME-INR     IMAGES: CXR 01/01/20: FINDINGS: The cardiomediastinal silhouette is normal in contour. No pleural effusion. No pneumothorax. No acute pleuroparenchymal abnormality. Visualized abdomen is unremarkable. Multilevel degenerative changes of the thoracic spine. IMPRESSION: No acute cardiopulmonary abnormality.  CTA Chest/Aorta 11/29/19: IMPRESSION: 1. No evidence of thoracic aortic aneurysm. 2. Unchanged scattered pulmonary nodules measuring up to 3 mm. No further follow-up required.  CT L-spine 09/18/19: IMPRESSION: 1. Symptomatic level might be L2-L3 where obscuration of the left neural foramen might be related to a foraminal disc extrusion. Query left L2 radiculitis. Noncontrast lumbar MRI would best evaluate further. 2. Large but chronic L1 inferior endplate Schmorl's node suspected. No acute osseous abnormality identified. Moderate to severe multilevel lumbar facet arthropathy, worst at L3-L4 where there is mild anterolisthesis. Possible mild to moderate multifactorial spinal stenosis at both L2-L3 and L3-L4.   EKG: EKG 05/22/19 ([facility]):  Sinus bradycardia at 57 bpm Biatrial enlargement   CV: Echo 12/31/19: IMPRESSIONS  1. Left ventricular ejection fraction, by estimation, is 60 to 65%. The  left ventricle has normal function. The left ventricle has no regional  wall motion abnormalities. Left ventricular diastolic parameters are  indeterminate.  2. Right ventricular systolic function is normal. The right ventricular  size is normal. There is normal pulmonary artery systolic pressure.  3. Right atrial size was mildly dilated.  4. The  mitral valve is normal in structure. Trivial mitral valve  regurgitation. No evidence of mitral stenosis.  5. The aortic valve is tricuspid. Aortic valve regurgitation is mild.  Mild aortic valve stenosis. Aortic valve mean gradient measures 8.6 mmHg. Aortic valve peak gradient measures 15.4 mmHg. Aortic valve  area, by VTI measures 1.13 cm.  6. The inferior vena cava is normal in size with greater than 50%  respiratory variability, suggesting right atrial pressure of 3 mmHg.  - Comparison(s): No significant change from prior study.    Cardiac cath 03/29/18 (done following 03/28/18 abnormal stress test concerning for anterior ischemia): LM: Moderate in size. LAD: Moderate in size.  Moderately tortuous.  45% mid LAD. D1: Small in size. D2: Moderate in size.  50% ostial D2, previously treated using DES. LCX: Moderate in size.  Angiographically normal. OM1: Moderate in size. OM2: Small in size. OM3: Moderate in size.  RCA: Vessel is large.  RPDA. Small in size. RPVA: Large in size. Conclusions: 1. Non-obstructive coronary artery disease with moderate mid LAD disease (DFR 0.93) and moderate D2 in-stent restenosis (DFR 0.93).  No significant disease involving the LCx and RCA. 2. Normal left ventricular systolic function and left ventricular filling pressure. 3. Mild aortic stenosis and regurgitation. 4. Mildly dilated ascending aorta. Recommendations: 1. Medical therapy and secondary prevention of coronary artery disease. 2. Consider cross-sectional imaging of aorta.   Carotid US 01/24/12: Impression: Normal carotid arteries, bilaterally.  Patent vertebral arteries with antegrade flow.   Past Medical History:  Diagnosis Date  . Allergy    seasonal  . Anemia    prior to hysterectomy, fibroids  . Aortic insufficiency    a. 10/2015 Echo: mild to moderate AI.  Marland Kitchen Aortic stenosis    a. 10/2015 Echo: moderate AS.  Marland Kitchen Blood transfusion without reported diagnosis    with hysterectomy  . CAD (coronary artery disease)    a. 08/2009 s/p cath after abnormal nuclear study; LM nl, LAD 60%, D2 90% (PCI/DES), LCX 50 -60%, RCA nl, nl EF;  b. 2012 MV: no ischemia/infarct;  c. 08/2015 MV: no ischemia/infarct, EF 70%.  . Complication of anesthesia   . Diastolic dysfunction    a. 10/2015 Echo: EF 55-60%,  no rwma, Gr1 DD, mod AS, mild to mod AI.  Marland Kitchen Essential hypertension   . H/O: rheumatic fever   . History of colonoscopy   . Hyperlipidemia   . Osteopenia    last DEXA 11-22-13   . PONV (postoperative nausea and vomiting)     Past Surgical History:  Procedure Laterality Date  . ANGIOPLASTY  may 2011   stents  . AORTIC ARCH ANGIOGRAPHY N/A 03/29/2018   Procedure: AORTIC ARCH ANGIOGRAPHY;  Surgeon: Nelva Bush, MD;  Location: [facility];  Service: Cardiovascular;  Laterality: N/A;  . CHOLECYSTECTOMY  nov 2004  . COLONOSCOPY  02/02/2016   per Dr. Henrene Pastor, adenomatous polyps, repeat in 5 yrs   . INTRAVASCULAR PRESSURE WIRE/FFR STUDY N/A 03/29/2018   Procedure: INTRAVASCULAR PRESSURE WIRE/FFR STUDY;  Surgeon: Nelva Bush, MD;  Location: [facility];  Service: Cardiovascular;  Laterality: N/A;  . KNEE ARTHROSCOPY     Rt. Knee  . LEFT HEART CATH AND CORONARY ANGIOGRAPHY N/A 03/29/2018   Procedure: LEFT HEART CATH AND CORONARY ANGIOGRAPHY;  Surgeon: Nelva Bush, MD;  Location: [facility];  Service: Cardiovascular;  Laterality: N/A;  . TONSILLECTOMY    . TONSILLECTOMY    . VAGINAL HYSTERECTOMY  1994    MEDICATIONS: .  aspirin 81 MG tablet  . atorvastatin (LIPITOR) 80 MG tablet  . Calcium Carbonate (CALCIUM 600) 1500 MG TABS  . cholecalciferol (VITAMIN D) 1000 UNITS tablet  . gabapentin (NEURONTIN) 300 MG capsule  . isosorbide mononitrate (IMDUR) 30 MG 24 hr tablet  . loratadine (CLARITIN) 10 MG tablet  . metoprolol succinate (TOPROL-XL) 25 MG 24 hr tablet  . nitroGLYCERIN (NITROSTAT) 0.4 MG SL tablet  . Probiotic Product (PROBIOTIC PO)  . traMADol (ULTRAM) 50 MG tablet  . vitamin C (ASCORBIC ACID) 500 MG tablet  . YUVAFEM 10 MCG TABS vaginal tablet   No current facility-administered medications for this encounter.    Myra Gianotti, PA-C Surgical Short Stay/Anesthesiology [facility] Phone [phone] [facility] Phone [phone] 01/02/2020 10:08  AM

## 2020-01-02 NOTE — Anesthesia Preprocedure Evaluation (Addendum)
Anesthesia Evaluation  Patient identified by MRN, date of birth, ID band Patient awake    Reviewed: Allergy & Precautions, NPO status , Patient's Chart, lab work & pertinent test results  History of Anesthesia Complications (+) PONV and history of anesthetic complications  Airway Mallampati: III  TM Distance: >3 FB Neck ROM: Full   Comment: Small mouth opening Dental no notable dental hx.    Pulmonary former smoker,    Pulmonary exam normal breath sounds clear to auscultation       Cardiovascular Exercise Tolerance: Good hypertension, + CAD and + Cardiac Stents (on Aspirin...holding for 7 days)   Rhythm:Regular Rate:Normal  Was previously walking 8 miles a day before leg pain started  CV: Echo 12/31/19: IMPRESSIONS  1. Left ventricular ejection fraction, by estimation, is 60 to 65%. The  left ventricle has normal function. The left ventricle has no regional  wall motion abnormalities. Left ventricular diastolic parameters are  indeterminate.  2. Right ventricular systolic function is normal. The right ventricular  size is normal. There is normal pulmonary artery systolic pressure.  3. Right atrial size was mildly dilated.  4. The mitral valve is normal in structure. Trivial mitral valve  regurgitation. No evidence of mitral stenosis.  5. The aortic valve is tricuspid. Aortic valve regurgitation is mild.  Mild aortic valve stenosis. Aortic valve mean gradient measures 8.6 mmHg. Aortic valve peak gradient measures 15.4 mmHg. Aortic valve area, by VTI measures 1.13 cm.  6. The inferior vena cava is normal in size with greater than 50%  respiratory variability, suggesting right atrial pressure of 3 mmHg.  - Comparison(s): No significant change from prior study.    Neuro/Psych negative neurological ROS  negative psych ROS   GI/Hepatic negative GI ROS, Neg liver ROS,   Endo/Other  negative endocrine ROS   Renal/GU negative Renal ROS     Musculoskeletal negative musculoskeletal ROS (+)   Abdominal Normal abdominal exam  (+)   Peds negative pediatric ROS (+)  Hematology negative hematology ROS (+)   Anesthesia Other Findings   Reproductive/Obstetrics                           Anesthesia Physical Anesthesia Plan  ASA: III  Anesthesia Plan: General   Post-op Pain Management:    Induction:   PONV Risk Score and Plan: 3 and Dexamethasone, Ondansetron, Propofol infusion, Treatment may vary due to age or medical condition and Midazolam  Airway Management Planned: Oral ETT  Additional Equipment: None  Intra-op Plan:   Post-operative Plan: Extubation in OR  Informed Consent: I have reviewed the patients History and Physical, chart, labs and discussed the procedure including the risks, benefits and alternatives for the proposed anesthesia with the patient or authorized representative who has indicated his/her understanding and acceptance.     Dental advisory given  Plan Discussed with:   Anesthesia Plan Comments: ( )      Anesthesia Quick Evaluation

## 2020-01-03 ENCOUNTER — Encounter (HOSPITAL_COMMUNITY)
Admission: RE | Disposition: A | Discharge status: Home / Self Care | Payer: Self-pay | Source: Ambulatory Visit | Attending: Orthopedic Surgery

## 2020-01-03 ENCOUNTER — Ambulatory Visit (HOSPITAL_COMMUNITY): Payer: Medicare Other

## 2020-01-03 ENCOUNTER — Encounter (HOSPITAL_COMMUNITY): Payer: Self-pay | Admitting: Orthopedic Surgery

## 2020-01-03 ENCOUNTER — Observation Stay (HOSPITAL_COMMUNITY)
Admission: RE | Admit: 2020-01-03 | Discharge: 2020-01-04 | Disposition: A | Discharge status: Home / Self Care | Payer: Medicare Other | Source: Ambulatory Visit | Attending: Orthopedic Surgery | Admitting: Orthopedic Surgery

## 2020-01-03 ENCOUNTER — Observation Stay (HOSPITAL_COMMUNITY): Payer: Medicare Other

## 2020-01-03 ENCOUNTER — Ambulatory Visit (HOSPITAL_COMMUNITY): Payer: Medicare Other | Admitting: Vascular Surgery

## 2020-01-03 ENCOUNTER — Other Ambulatory Visit: Payer: Self-pay

## 2020-01-03 DIAGNOSIS — I25110 Atherosclerotic heart disease of native coronary artery with unstable angina pectoris: Secondary | ICD-10-CM | POA: Insufficient documentation

## 2020-01-03 DIAGNOSIS — Z7982 Long term (current) use of aspirin: Secondary | ICD-10-CM | POA: Diagnosis not present

## 2020-01-03 DIAGNOSIS — I2510 Atherosclerotic heart disease of native coronary artery without angina pectoris: Secondary | ICD-10-CM | POA: Diagnosis not present

## 2020-01-03 DIAGNOSIS — M5116 Intervertebral disc disorders with radiculopathy, lumbar region: Principal | ICD-10-CM | POA: Insufficient documentation

## 2020-01-03 DIAGNOSIS — Z79899 Other long term (current) drug therapy: Secondary | ICD-10-CM | POA: Diagnosis not present

## 2020-01-03 DIAGNOSIS — Z87891 Personal history of nicotine dependence: Secondary | ICD-10-CM | POA: Diagnosis not present

## 2020-01-03 DIAGNOSIS — I119 Hypertensive heart disease without heart failure: Secondary | ICD-10-CM | POA: Insufficient documentation

## 2020-01-03 DIAGNOSIS — I10 Essential (primary) hypertension: Secondary | ICD-10-CM | POA: Diagnosis not present

## 2020-01-03 DIAGNOSIS — M5136 Other intervertebral disc degeneration, lumbar region: Secondary | ICD-10-CM | POA: Diagnosis not present

## 2020-01-03 DIAGNOSIS — M545 Low back pain: Secondary | ICD-10-CM | POA: Diagnosis present

## 2020-01-03 DIAGNOSIS — M5126 Other intervertebral disc displacement, lumbar region: Secondary | ICD-10-CM | POA: Diagnosis present

## 2020-01-03 DIAGNOSIS — Z96651 Presence of right artificial knee joint: Secondary | ICD-10-CM | POA: Insufficient documentation

## 2020-01-03 DIAGNOSIS — M48061 Spinal stenosis, lumbar region without neurogenic claudication: Secondary | ICD-10-CM | POA: Diagnosis not present

## 2020-01-03 DIAGNOSIS — Z419 Encounter for procedure for purposes other than remedying health state, unspecified: Secondary | ICD-10-CM

## 2020-01-03 DIAGNOSIS — Z9889 Other specified postprocedural states: Secondary | ICD-10-CM | POA: Diagnosis not present

## 2020-01-03 DIAGNOSIS — E785 Hyperlipidemia, unspecified: Secondary | ICD-10-CM | POA: Diagnosis not present

## 2020-01-03 DIAGNOSIS — M532X6 Spinal instabilities, lumbar region: Secondary | ICD-10-CM | POA: Diagnosis not present

## 2020-01-03 HISTORY — PX: LUMBAR LAMINECTOMY/DECOMPRESSION MICRODISCECTOMY: SHX5026

## 2020-01-03 SURGERY — LUMBAR LAMINECTOMY/DECOMPRESSION MICRODISCECTOMY 1 LEVEL
Anesthesia: General | Site: Spine Lumbar | Laterality: Left

## 2020-01-03 MED ORDER — METHOCARBAMOL 500 MG PO TABS
ORAL_TABLET | ORAL | Status: AC
  Filled 2020-01-03: qty 1

## 2020-01-03 MED ORDER — EPHEDRINE SULFATE-NACL 50-0.9 MG/10ML-% IV SOSY
PREFILLED_SYRINGE | INTRAVENOUS | Status: DC | PRN
  Administered 2020-01-03: 10 mg via INTRAVENOUS

## 2020-01-03 MED ORDER — SUGAMMADEX SODIUM 200 MG/2ML IV SOLN
INTRAVENOUS | Status: DC | PRN
  Administered 2020-01-03: 200 mg via INTRAVENOUS

## 2020-01-03 MED ORDER — CEFAZOLIN SODIUM-DEXTROSE 1-4 GM/50ML-% IV SOLN
1.0000 g | Freq: Three times a day (TID) | INTRAVENOUS | Status: AC
  Administered 2020-01-03 – 2020-01-04 (×2): 1 g via INTRAVENOUS
  Filled 2020-01-03 (×2): qty 50

## 2020-01-03 MED ORDER — ACETAMINOPHEN 325 MG PO TABS
ORAL_TABLET | ORAL | Status: AC
  Filled 2020-01-03: qty 2

## 2020-01-03 MED ORDER — AMISULPRIDE (ANTIEMETIC) 5 MG/2ML IV SOLN: 10.0000 mg | Freq: Once | INTRAVENOUS | Status: DC | PRN

## 2020-01-03 MED ORDER — ONDANSETRON HCL 4 MG PO TABS: 4.0000 mg | ORAL_TABLET | Freq: Four times a day (QID) | ORAL | Status: DC | PRN

## 2020-01-03 MED ORDER — METOPROLOL SUCCINATE ER 25 MG PO TB24: 25.0000 mg | ORAL_TABLET | Freq: Every day | ORAL | Status: DC

## 2020-01-03 MED ORDER — PROPOFOL 10 MG/ML IV BOLUS
INTRAVENOUS | Status: DC | PRN
  Administered 2020-01-03: 100 mg via INTRAVENOUS

## 2020-01-03 MED ORDER — PHENOL 1.4 % MT LIQD: 1.0000 | OROMUCOSAL | Status: DC | PRN

## 2020-01-03 MED ORDER — ROCURONIUM BROMIDE 10 MG/ML (PF) SYRINGE
PREFILLED_SYRINGE | INTRAVENOUS | Status: AC
  Filled 2020-01-03: qty 10

## 2020-01-03 MED ORDER — LIDOCAINE 2% (20 MG/ML) 5 ML SYRINGE
INTRAMUSCULAR | Status: AC
  Filled 2020-01-03: qty 5

## 2020-01-03 MED ORDER — POLYETHYLENE GLYCOL 3350 17 G PO PACK: 17.0000 g | PACK | Freq: Every day | ORAL | Status: DC | PRN

## 2020-01-03 MED ORDER — ONDANSETRON HCL 4 MG/2ML IJ SOLN
INTRAMUSCULAR | Status: DC | PRN
  Administered 2020-01-03: 4 mg via INTRAVENOUS

## 2020-01-03 MED ORDER — PHENYLEPHRINE 40 MCG/ML (10ML) SYRINGE FOR IV PUSH (FOR BLOOD PRESSURE SUPPORT)
PREFILLED_SYRINGE | INTRAVENOUS | Status: AC
  Filled 2020-01-03: qty 10

## 2020-01-03 MED ORDER — CEFAZOLIN SODIUM-DEXTROSE 2-4 GM/100ML-% IV SOLN
2.0000 g | INTRAVENOUS | Status: AC
  Administered 2020-01-03: 2 g via INTRAVENOUS

## 2020-01-03 MED ORDER — MIDAZOLAM HCL 5 MG/5ML IJ SOLN
INTRAMUSCULAR | Status: DC | PRN
  Administered 2020-01-03: 2 mg via INTRAVENOUS

## 2020-01-03 MED ORDER — SODIUM CHLORIDE 0.9% FLUSH: 3.0000 mL | Freq: Two times a day (BID) | INTRAVENOUS | Status: DC

## 2020-01-03 MED ORDER — ISOSORBIDE MONONITRATE ER 30 MG PO TB24
30.0000 mg | ORAL_TABLET | Freq: Every day | ORAL | Status: DC
  Filled 2020-01-03: qty 1

## 2020-01-03 MED ORDER — PHENYLEPHRINE HCL-NACL 10-0.9 MG/250ML-% IV SOLN
INTRAVENOUS | Status: DC | PRN
  Administered 2020-01-03: 20 ug/min via INTRAVENOUS
  Administered 2020-01-03: 25 ug/min via INTRAVENOUS

## 2020-01-03 MED ORDER — PHENYLEPHRINE 40 MCG/ML (10ML) SYRINGE FOR IV PUSH (FOR BLOOD PRESSURE SUPPORT)
PREFILLED_SYRINGE | INTRAVENOUS | Status: DC | PRN
  Administered 2020-01-03 (×2): 80 ug via INTRAVENOUS

## 2020-01-03 MED ORDER — MENTHOL 3 MG MT LOZG: 1.0000 | LOZENGE | OROMUCOSAL | Status: DC | PRN

## 2020-01-03 MED ORDER — SODIUM CHLORIDE 0.9% FLUSH: 3.0000 mL | INTRAVENOUS | Status: DC | PRN

## 2020-01-03 MED ORDER — SCOPOLAMINE 1 MG/3DAYS TD PT72
MEDICATED_PATCH | TRANSDERMAL | Status: DC | PRN
  Administered 2020-01-03: 1 via TRANSDERMAL

## 2020-01-03 MED ORDER — GABAPENTIN 300 MG PO CAPS
300.0000 mg | ORAL_CAPSULE | Freq: Three times a day (TID) | ORAL | Status: DC
  Administered 2020-01-03: 300 mg via ORAL
  Filled 2020-01-03: qty 3

## 2020-01-03 MED ORDER — LACTATED RINGERS IV SOLN: INTRAVENOUS | Status: DC

## 2020-01-03 MED ORDER — PROMETHAZINE HCL 25 MG/ML IJ SOLN: 6.2500 mg | INTRAMUSCULAR | Status: DC | PRN

## 2020-01-03 MED ORDER — ACETAMINOPHEN 325 MG PO TABS
650.0000 mg | ORAL_TABLET | ORAL | Status: DC | PRN
  Administered 2020-01-03: 650 mg via ORAL

## 2020-01-03 MED ORDER — FENTANYL CITRATE (PF) 100 MCG/2ML IJ SOLN
INTRAMUSCULAR | Status: DC | PRN
Start: 2020-01-03 — End: 2020-01-03
  Administered 2020-01-03 (×2): 25 ug via INTRAVENOUS
  Administered 2020-01-03: 50 ug via INTRAVENOUS
  Administered 2020-01-03: 150 ug via INTRAVENOUS

## 2020-01-03 MED ORDER — CHLORHEXIDINE GLUCONATE 0.12 % MT SOLN
OROMUCOSAL | Status: AC
  Administered 2020-01-03: 15 mL via OROMUCOSAL
  Filled 2020-01-03: qty 15

## 2020-01-03 MED ORDER — ACETAMINOPHEN 650 MG RE SUPP: 650.0000 mg | RECTAL | Status: DC | PRN

## 2020-01-03 MED ORDER — THROMBIN (RECOMBINANT) 5000 UNITS EX SOLR
CUTANEOUS | Status: AC
  Filled 2020-01-03: qty 5000

## 2020-01-03 MED ORDER — NITROGLYCERIN 0.4 MG SL SUBL: 0.4000 mg | SUBLINGUAL_TABLET | SUBLINGUAL | Status: DC | PRN

## 2020-01-03 MED ORDER — DEXAMETHASONE SODIUM PHOSPHATE 10 MG/ML IJ SOLN
INTRAMUSCULAR | Status: DC | PRN
  Administered 2020-01-03: 10 mg via INTRAVENOUS

## 2020-01-03 MED ORDER — BUPIVACAINE-EPINEPHRINE 0.25% -1:200000 IJ SOLN
INTRAMUSCULAR | Status: DC | PRN
  Administered 2020-01-03 (×2): 10 mL

## 2020-01-03 MED ORDER — THROMBIN 20000 UNITS EX KIT
PACK | CUTANEOUS | Status: AC
  Filled 2020-01-03: qty 1

## 2020-01-03 MED ORDER — ONDANSETRON HCL 4 MG PO TABS: 4.0000 mg | ORAL_TABLET | Freq: Three times a day (TID) | ORAL | 0 refills | Status: DC | PRN

## 2020-01-03 MED ORDER — LIDOCAINE 2% (20 MG/ML) 5 ML SYRINGE
INTRAMUSCULAR | Status: DC | PRN
  Administered 2020-01-03: 100 mg via INTRAVENOUS

## 2020-01-03 MED ORDER — BUPIVACAINE LIPOSOME 1.3 % IJ SUSP
INTRAMUSCULAR | Status: DC | PRN
  Administered 2020-01-03: 10 mL

## 2020-01-03 MED ORDER — THROMBIN 20000 UNITS EX SOLR
CUTANEOUS | Status: DC | PRN
  Administered 2020-01-03: 20 mL via TOPICAL

## 2020-01-03 MED ORDER — ROCURONIUM BROMIDE 10 MG/ML (PF) SYRINGE
PREFILLED_SYRINGE | INTRAVENOUS | Status: DC | PRN
  Administered 2020-01-03: 80 mg via INTRAVENOUS
  Administered 2020-01-03: 20 mg via INTRAVENOUS

## 2020-01-03 MED ORDER — CHLORHEXIDINE GLUCONATE 0.12 % MT SOLN: 15.0000 mL | Freq: Once | OROMUCOSAL | Status: AC

## 2020-01-03 MED ORDER — HEMOSTATIC AGENTS (NO CHARGE) OPTIME
TOPICAL | Status: DC | PRN
  Administered 2020-01-03 (×2): 1 via TOPICAL

## 2020-01-03 MED ORDER — METHYLPREDNISOLONE ACETATE 40 MG/ML IJ SUSP
INTRAMUSCULAR | Status: AC
  Filled 2020-01-03: qty 1

## 2020-01-03 MED ORDER — METHOCARBAMOL 500 MG PO TABS: 500.0000 mg | ORAL_TABLET | Freq: Three times a day (TID) | ORAL | 0 refills | Status: AC | PRN

## 2020-01-03 MED ORDER — OXYCODONE-ACETAMINOPHEN 10-325 MG PO TABS: 1.0000 | ORAL_TABLET | Freq: Four times a day (QID) | ORAL | 0 refills | Status: AC | PRN

## 2020-01-03 MED ORDER — OXYCODONE HCL 5 MG PO TABS: 5.0000 mg | ORAL_TABLET | ORAL | Status: DC | PRN

## 2020-01-03 MED ORDER — MIDAZOLAM HCL 2 MG/2ML IJ SOLN
INTRAMUSCULAR | Status: AC
  Filled 2020-01-03: qty 2

## 2020-01-03 MED ORDER — FENTANYL CITRATE (PF) 250 MCG/5ML IJ SOLN
INTRAMUSCULAR | Status: AC
  Filled 2020-01-03: qty 5

## 2020-01-03 MED ORDER — BUPIVACAINE-EPINEPHRINE (PF) 0.25% -1:200000 IJ SOLN
INTRAMUSCULAR | Status: AC
  Filled 2020-01-03: qty 30

## 2020-01-03 MED ORDER — METHYLPREDNISOLONE ACETATE 40 MG/ML INJ SUSP (RADIOLOG
INTRAMUSCULAR | Status: DC | PRN
  Administered 2020-01-03: 40 mg

## 2020-01-03 MED ORDER — ONDANSETRON HCL 4 MG/2ML IJ SOLN: 4.0000 mg | Freq: Four times a day (QID) | INTRAMUSCULAR | Status: DC | PRN

## 2020-01-03 MED ORDER — EPHEDRINE 5 MG/ML INJ
INTRAVENOUS | Status: AC
  Filled 2020-01-03: qty 10

## 2020-01-03 MED ORDER — SODIUM CHLORIDE 0.9 % IV SOLN: 250.0000 mL | INTRAVENOUS | Status: DC

## 2020-01-03 MED ORDER — ORAL CARE MOUTH RINSE: 15.0000 mL | Freq: Once | OROMUCOSAL | Status: AC

## 2020-01-03 MED ORDER — METHOCARBAMOL 500 MG PO TABS
500.0000 mg | ORAL_TABLET | Freq: Four times a day (QID) | ORAL | Status: DC | PRN
  Administered 2020-01-03 – 2020-01-04 (×2): 500 mg via ORAL
  Filled 2020-01-03: qty 1

## 2020-01-03 MED ORDER — 0.9 % SODIUM CHLORIDE (POUR BTL) OPTIME
TOPICAL | Status: DC | PRN
  Administered 2020-01-03: 1000 mL

## 2020-01-03 MED ORDER — MORPHINE SULFATE (PF) 2 MG/ML IV SOLN: 2.0000 mg | INTRAVENOUS | Status: DC | PRN

## 2020-01-03 MED ORDER — THROMBIN 20000 UNITS EX SOLR
CUTANEOUS | Status: AC
  Filled 2020-01-03: qty 20000

## 2020-01-03 MED ORDER — BUPIVACAINE LIPOSOME 1.3 % IJ SUSP
20.0000 mL | INTRAMUSCULAR | Status: DC
  Filled 2020-01-03: qty 20

## 2020-01-03 MED ORDER — DOCUSATE SODIUM 100 MG PO CAPS
100.0000 mg | ORAL_CAPSULE | Freq: Two times a day (BID) | ORAL | Status: DC
  Administered 2020-01-03: 100 mg via ORAL
  Filled 2020-01-03: qty 1

## 2020-01-03 MED ORDER — HYDROMORPHONE HCL 1 MG/ML IJ SOLN: 0.2500 mg | INTRAMUSCULAR | Status: DC | PRN

## 2020-01-03 MED ORDER — CEFAZOLIN SODIUM-DEXTROSE 2-4 GM/100ML-% IV SOLN
INTRAVENOUS | Status: AC
  Filled 2020-01-03: qty 100

## 2020-01-03 MED ORDER — OXYCODONE HCL 5 MG PO TABS
10.0000 mg | ORAL_TABLET | ORAL | Status: DC | PRN
  Administered 2020-01-03 – 2020-01-04 (×2): 10 mg via ORAL
  Filled 2020-01-03 (×2): qty 2

## 2020-01-03 MED ORDER — METHOCARBAMOL 1000 MG/10ML IJ SOLN
500.0000 mg | Freq: Four times a day (QID) | INTRAVENOUS | Status: DC | PRN
  Filled 2020-01-03: qty 5

## 2020-01-03 SURGICAL SUPPLY — 58 items
BNDG GAUZE ELAST 4 BULKY (GAUZE/BANDAGES/DRESSINGS) IMPLANT
BUR EGG ELITE 4.0 (BURR) IMPLANT
BUR MATCHSTICK NEURO 3.0 LAGG (BURR) ×2 IMPLANT
CANISTER SUCT 3000ML PPV (MISCELLANEOUS) ×2 IMPLANT
CLSR STERI-STRIP ANTIMIC 1/2X4 (GAUZE/BANDAGES/DRESSINGS) ×2 IMPLANT
CORD BIPOLAR FORCEPS 12FT (ELECTRODE) ×2 IMPLANT
COVER SURGICAL LIGHT HANDLE (MISCELLANEOUS) ×2 IMPLANT
COVER WAND RF STERILE (DRAPES) IMPLANT
DRAIN CHANNEL 15F RND FF W/TCR (WOUND CARE) IMPLANT
DRAPE POUCH INSTRU U-SHP 10X18 (DRAPES) ×2 IMPLANT
DRAPE SURG 17X23 STRL (DRAPES) ×2 IMPLANT
DRAPE U-SHAPE 47X51 STRL (DRAPES) ×2 IMPLANT
DRSG OPSITE POSTOP 3X4 (GAUZE/BANDAGES/DRESSINGS) IMPLANT
DRSG OPSITE POSTOP 4X6 (GAUZE/BANDAGES/DRESSINGS) ×2 IMPLANT
DURAPREP 26ML APPLICATOR (WOUND CARE) ×2 IMPLANT
ELECT BLADE 4.0 EZ CLEAN MEGAD (MISCELLANEOUS)
ELECT CAUTERY BLADE 6.4 (BLADE) ×2 IMPLANT
ELECT PENCIL ROCKER SW 15FT (MISCELLANEOUS) ×2 IMPLANT
ELECT REM PT RETURN 9FT ADLT (ELECTROSURGICAL) ×2
ELECTRODE BLDE 4.0 EZ CLN MEGD (MISCELLANEOUS) IMPLANT
ELECTRODE REM PT RTRN 9FT ADLT (ELECTROSURGICAL) ×1 IMPLANT
EVACUATOR SILICONE 100CC (DRAIN) IMPLANT
GLOVE BIO SURGEON STRL SZ 6.5 (GLOVE) ×2 IMPLANT
GLOVE BIOGEL PI IND STRL 6.5 (GLOVE) ×1 IMPLANT
GLOVE BIOGEL PI IND STRL 8.5 (GLOVE) ×1 IMPLANT
GLOVE BIOGEL PI INDICATOR 6.5 (GLOVE) ×1
GLOVE BIOGEL PI INDICATOR 8.5 (GLOVE) ×1
GLOVE SS BIOGEL STRL SZ 8.5 (GLOVE) ×1 IMPLANT
GLOVE SUPERSENSE BIOGEL SZ 8.5 (GLOVE) ×1
GOWN STRL REUS W/ TWL LRG LVL3 (GOWN DISPOSABLE) ×2 IMPLANT
GOWN STRL REUS W/TWL 2XL LVL3 (GOWN DISPOSABLE) ×2 IMPLANT
GOWN STRL REUS W/TWL LRG LVL3 (GOWN DISPOSABLE) ×2
KIT BASIN OR (CUSTOM PROCEDURE TRAY) ×2 IMPLANT
KIT TURNOVER KIT B (KITS) ×2 IMPLANT
NEEDLE 22X1 1/2 (OR ONLY) (NEEDLE) ×2 IMPLANT
NEEDLE SPNL 18GX3.5 QUINCKE PK (NEEDLE) ×4 IMPLANT
NS IRRIG 1000ML POUR BTL (IV SOLUTION) ×2 IMPLANT
PACK LAMINECTOMY ORTHO (CUSTOM PROCEDURE TRAY) ×2 IMPLANT
PACK UNIVERSAL I (CUSTOM PROCEDURE TRAY) ×2 IMPLANT
PAD ARMBOARD 7.5X6 YLW CONV (MISCELLANEOUS) ×4 IMPLANT
PATTIES SURGICAL .5 X.5 (GAUZE/BANDAGES/DRESSINGS) ×4 IMPLANT
PATTIES SURGICAL .5 X1 (DISPOSABLE) ×2 IMPLANT
SPONGE SURGIFOAM ABS GEL 100 (HEMOSTASIS) ×2 IMPLANT
SURGIFLO W/THROMBIN 8M KIT (HEMOSTASIS) ×4 IMPLANT
SUT BONE WAX W31G (SUTURE) ×2 IMPLANT
SUT MNCRL AB 3-0 PS2 27 (SUTURE) ×2 IMPLANT
SUT VIC AB 0 CT1 27 (SUTURE)
SUT VIC AB 0 CT1 27XBRD ANBCTR (SUTURE) IMPLANT
SUT VIC AB 1 CT1 18XCR BRD 8 (SUTURE) ×1 IMPLANT
SUT VIC AB 1 CT1 8-18 (SUTURE) ×1
SUT VIC AB 2-0 CT1 18 (SUTURE) ×2 IMPLANT
SYR 3ML LL SCALE MARK (SYRINGE) ×2 IMPLANT
SYR BULB IRRIG 60ML STRL (SYRINGE) ×2 IMPLANT
SYR CONTROL 10ML LL (SYRINGE) ×2 IMPLANT
TOWEL GREEN STERILE (TOWEL DISPOSABLE) ×2 IMPLANT
TOWEL GREEN STERILE FF (TOWEL DISPOSABLE) ×2 IMPLANT
WATER STERILE IRR 1000ML POUR (IV SOLUTION) IMPLANT
YANKAUER SUCT BULB TIP NO VENT (SUCTIONS) ×2 IMPLANT

## 2020-01-03 NOTE — Op Note (Signed)
Operative report  Preoperative diagnosis: L2-3 left posterior lateral disc herniation with cranial migration causing L2 and L3 nerve root compression and radicular pain.  Postoperative diagnosis: Same  Operative procedure: L2-3 lumbar decompression with L2 inferior facetectomy and laminectomy (unilateral Gill decompression) with removal of large disc herniation.  In situ fusion L2-3.  Complications: As result of the need to do an extensive decompression to adequately visualize and remove the disc fragment a near complete laminectomy on the left side of L3 was required which resulted in the instability of the inferior left L2 facet.  As result the facet was removed which facilitated the foraminotomy and removal of the disc fragment and complete decompression of the left lateral recess.  As result of the potential for instability an in situ fusion was performed on the contralateral side.  The facet capsule was debrided and the transverse processes were identified and decorticated and bone graft was placed into the posterior lateral gutter.  First Assistant: Cleta Alberts, PA  EBL: 300 cc  Indications: Anne Martin is a pleasant 74 year old woman who started having significant back and radicular leg pain several months ago.  This pain seemed to improve and then more recently she had horrific worsening of her symptoms.  An MRI demonstrated a large posterior lateral disc herniation at L2-3 causing significant lateral recess compression of the traversing L3 nerve root and exiting L2 nerve root.  The disc fragment came out of the L2-3 disc and migrated cranially.  As result of the worsening radicular left leg pain we elected to move forward with surgery.  All appropriate risks benefits and alternatives to surgery were discussed with the patient and consent was obtained.  Operative report patient was brought the operating room placed upon the operating room table.  After successful induction of general anesthesia and  endotracheal intubation, teds and SCDs were applied.  She was turned prone onto the Wilson frame and all bony prominences were well-padded.  The back was then prepped and draped in a standard fashion.  Timeout was taken to confirm patient procedure and all other important data.  2 needles were placed into the back and an x-ray was taken for localization of the incision.  I marked out the incision site spanning the L2 spinous process.  I infiltrated the area with quarter percent Marcaine with epinephrine.  Incision was made and sharp dissection was carried out down to the deep fascia.  I then injected the paraspinal muscles with a combination of core percent Marcaine with epinephrine and Exparel.  This provided intraoperative hemostasis as well as postoperative analgesia.  I then stripped the paraspinal muscles to expose the entire L2 spinous process and lamina bilaterally.  Penfield 4 was placed on the L2 lamina and an x-ray was taken to confirm I was at the appropriate level.  Because of the narrow space and the size of the disc herniation I elected to remove the spinous process and perform a central decompression and expanded out to the left side so I could not have adequate visualization to remove the disc fragment.  The spinous process was removed and I performed a central laminotomy.  I then carried my laminotomy to the left-hand side.  At this point I remove the excess ligamentum flavum and then dissected through the ligamentum flavum to create a plane between the thecal sac and the ligamentum flavum I used my 2 mm Kerrison rongeur to begin resecting the ligamentum flavum.  As I entered into the lateral recess there was significant  thickening of the ligamentum flavum and some disc material consistent with what was seen on the preoperative MRI.  I removed all of this and I entered into the lateral recess.  In order to create a plane between the posterior annulus and the thecal sac I performed a medial  facetectomy.  At this point I was able to gently mobilize the thecal sac and expose the disc fragment.  I could not see the most cranial portion of the disc fragment and so I did a more aggressive laminotomy on the left side.  It was at this point that identified more of the disc fragment but it was adherent to the L2 nerve root.  I was concerned that trying to gently dissect and mobilize this would result in trauma to the nerve root.  As such I elected to expose more proximal.  As I was performing the laminotomy the L2 inferior facet broke free.  I elected to remove this which allowed me much greater visualization.  I then performed a more aggressive medial facetectomy of the superior L3 facet and now I could visualize the L2 nerve root in its entirety in the foramen and the L3 nerve root as it was traversing and going to the L3 foramen.  I then mobilized the disc fragment that was just underneath the L2 nerve root with much greater ease and it was able to be gently manipulated and removed.  At this point the entire left lateral recess was completely decompressed.  In comparison to the preoperative x-ray the area causing maximal stenosis centrally and in the lateral recess had been completely decompressed.  Large epidural veins were identified and coagulated.  I could easily pass my nerve hook underneath the thecal sac along the surface of the annulus and superiorly up to the L2 pedicle.  I could visualize the L2 nerve root and it was completely decompressed and freely mobile.  In addition the L3 nerve root was identifiable and was also freely mobile.  I can easily pass my [facility] down and out the L3 foramen and along the root of the L2 nerve root.  At this point I was very pleased with the overall decompression.  Because of the loss of the facet on the left side I elected to perform an in situ fusion in hopes to prevent any iatrogenic instability.  The facet that was removed was prepped for autograft  insertion.  The right-sided facet was exposed using Bovie and I exposed the transverse processes of L2 and L3.  I decorticated this entire area with a high-speed bur and then packed the posterior lateral gutter with the autograft bone.  At this point I irrigated the wound copiously normal saline and checked 1 last time with my nerve hook to ensure that the L2 and L3 nerve roots were completely decompressed and there is no residual fragments of disc material that I could appreciate.  At this point with hemostasis obtained I did place approximately 20 mg (1/2 cc) of Depo-Medrol over the nerve root to aid in postoperative analgesia.  The retractors were removed and the deep fascia was closed with interrupted #1 Vicryl suture.  Superficial was closed with 2-0 Vicryl suture and 3-0 Monocryl for the skin.  Steri-Strips and a dry dressing were applied and the patient was ultimately extubated transfer the PACU without incident.  The end of the case all needle sponge counts were correct.  There were no adverse intraoperative events

## 2020-01-03 NOTE — Brief Op Note (Signed)
01/03/2020  5:49 PM  PATIENT:  TWISHA VANPELT  74 y.o. female  PRE-OPERATIVE DIAGNOSIS:  Herniated disc, lumbar radiculopathy  POST-OPERATIVE DIAGNOSIS:  Herniated disc, lumbar radiculopathy  PROCEDURE:  Procedure(s): Left L2-3 decompression disectomy, Insitu Fusion Lumbar 2-3 (Left)  SURGEON:  Surgeon(s) and Role:    Melina Schools, MD - Primary  PHYSICIAN ASSISTANT:   ASSISTANTS: Amanda Ward, PA   ANESTHESIA:   general  EBL:  300 mL   BLOOD ADMINISTERED:none  DRAINS: none   LOCAL MEDICATIONS USED:  MARCAINE    and OTHER exparel.  depomedrol  SPECIMEN:  No Specimen  DISPOSITION OF SPECIMEN:  N/A  COUNTS:  YES  TOURNIQUET:  * No tourniquets in log *  DICTATION: .Dragon Dictation  PLAN OF CARE: Admit for overnight observation  PATIENT DISPOSITION:  PACU - hemodynamically stable.

## 2020-01-03 NOTE — H&P (Signed)
Addendum H&P.  There has been no change in the patient's clinical exam since her last office visit of 12/31/2019.  She has significant radicular left leg pain consistent with her L2-3 posterior lateral to the left disc herniation.  Plan is to perform a lumbar decompression/discectomy at L2-3 for her radicular leg pain.  I have gone over the surgical procedure as well as the risks and benefits with her and all of her questions were encouraged and addressed.  Patient has expressed a willingness and desire to move forward with surgery.

## 2020-01-03 NOTE — Transfer of Care (Signed)
Immediate Anesthesia Transfer of Care Note  Patient: Anne Martin  Procedure(s) Performed: Left L2-3 decompression disectomy, Insitu Fusion Lumbar 2-3 (Left Spine Lumbar)  Patient Location: PACU  Anesthesia Type:General  Level of Consciousness: drowsy and patient cooperative  Airway & Oxygen Therapy: Patient Spontanous Breathing  Post-op Assessment: Report given to RN and Post -op Vital signs reviewed and stable  Post vital signs: Reviewed and stable  Last Vitals:  Vitals Value Taken Time  BP 137/70 01/03/20 1803  Temp    Pulse 79 01/03/20 1804  Resp 19 01/03/20 1804  SpO2 95 % 01/03/20 1804  Vitals shown include unvalidated device data.  Last Pain:  Vitals:   01/03/20 1318  TempSrc:   PainSc: 7       Patients Stated Pain Goal: 3 (79/15/04 1364)  Complications: No complications documented.

## 2020-01-03 NOTE — Anesthesia Procedure Notes (Signed)
Procedure Name: Intubation Date/Time: 01/03/2020 3:22 PM Performed by: Candis Shine, CRNA Pre-anesthesia Checklist: Patient identified, Emergency Drugs available, Suction available and Patient being monitored Patient Re-evaluated:Patient Re-evaluated prior to induction Oxygen Delivery Method: Circle System Utilized Preoxygenation: Pre-oxygenation with 100% oxygen Induction Type: IV induction Ventilation: Mask ventilation without difficulty Laryngoscope Size: Mac and 3 Grade View: Grade II Tube type: Oral Tube size: 7.0 mm Number of attempts: 1 Airway Equipment and Method: Stylet Placement Confirmation: ETT inserted through vocal cords under direct vision,  positive ETCO2 and breath sounds checked- equal and bilateral Secured at: 22 cm Tube secured with: Tape Dental Injury: Teeth and Oropharynx as per pre-operative assessment

## 2020-01-03 NOTE — Discharge Instructions (Signed)

## 2020-01-04 ENCOUNTER — Encounter (HOSPITAL_COMMUNITY): Payer: Self-pay | Admitting: Orthopedic Surgery

## 2020-01-04 DIAGNOSIS — Z79899 Other long term (current) drug therapy: Secondary | ICD-10-CM | POA: Diagnosis not present

## 2020-01-04 DIAGNOSIS — M5116 Intervertebral disc disorders with radiculopathy, lumbar region: Secondary | ICD-10-CM | POA: Diagnosis not present

## 2020-01-04 DIAGNOSIS — M5126 Other intervertebral disc displacement, lumbar region: Secondary | ICD-10-CM | POA: Diagnosis not present

## 2020-01-04 DIAGNOSIS — I119 Hypertensive heart disease without heart failure: Secondary | ICD-10-CM | POA: Diagnosis not present

## 2020-01-04 DIAGNOSIS — I25110 Atherosclerotic heart disease of native coronary artery with unstable angina pectoris: Secondary | ICD-10-CM | POA: Diagnosis not present

## 2020-01-04 DIAGNOSIS — Z7982 Long term (current) use of aspirin: Secondary | ICD-10-CM | POA: Diagnosis not present

## 2020-01-04 LAB — POCT I-STAT 7, (LYTES, BLD GAS, ICA,H+H)
Acid-base deficit: 2 mmol/L (ref 0.0–2.0)
Bicarbonate: 22.9 mmol/L (ref 20.0–28.0)
Calcium, Ion: 1.21 mmol/L (ref 1.15–1.40)
HCT: 38 % (ref 36.0–46.0)
Hemoglobin: 12.9 g/dL (ref 12.0–15.0)
O2 Saturation: 91 %
Potassium: 3.9 mmol/L (ref 3.5–5.1)
Sodium: 138 mmol/L (ref 135–145)
TCO2: 24 mmol/L (ref 22–32)
pCO2 arterial: 39.9 mmHg (ref 32.0–48.0)
pH, Arterial: 7.366 (ref 7.350–7.450)
pO2, Arterial: 63 mmHg — ABNORMAL LOW (ref 83.0–108.0)

## 2020-01-04 NOTE — Progress Notes (Signed)
    Subjective: Procedure(s) (LRB): Left L2-3 decompression disectomy, Insitu Fusion Lumbar 2-3 (Left) 1 Day Post-Op  Patient reports pain as 1 on 0-10 scale.  Reports none leg pain reports incisional back pain   Positive void Negative bowel movement Positive flatus Negative chest pain or shortness of breath  Objective: Vital signs in last 24 hours: Temp:  [97.5 F (36.4 C)-98.3 F (36.8 C)] 97.6 F (36.4 C) (09/24 0347) Pulse Rate:  [60-79] 63 (09/24 0347) Resp:  [10-19] 18 (09/24 0347) BP: (99-165)/(61-87) 104/63 (09/24 0347) SpO2:  [93 %-100 %] 97 % (09/24 0347) Weight:  [71.7 kg] 71.7 kg (09/23 1256)  Intake/Output from previous day: 09/23 0701 - 09/24 0700 In: 1600 [I.V.:1600] Out: 300 [Blood:300]  Labs: Recent Labs    01/01/20 0938 01/03/20 1743  WBC 10.1  --   RBC 6.00*  --   HCT 51.2* 38.0  PLT 251  --    Recent Labs    01/01/20 0938 01/03/20 1743  NA 141 138  K 3.6 3.9  CL 101  --   CO2 28  --   BUN 20  --   CREATININE 0.90  --   GLUCOSE 74  --   CALCIUM 9.8  --    Recent Labs    01/01/20 0938  INR 1.0    Physical Exam: Neurologically intact ABD soft Intact pulses distally Incision: dressing C/D/I and no drainage Compartment soft Body mass index is 26.29 kg/m.   Assessment/Plan: Patient stable  xrays n/a Continue mobilization with physical therapy Continue care  Advance diet Up with therapy  Patient reports complete relief of radicular left leg pain.  She noted that after walking the entire length of the hallway she started to have some dysesthesias and pain in the left leg but with rest it resolved completely.  At this point I am pleased with her recovery.  We will plan on discharge to home.  I have given her instructions on how to monitor her activity level and to just ambulate and do lower extremity range of motion exercises.  She will follow-up with me in 2 weeks.  If any issues or problems arise she knows to contact me and we  will see her sooner.  Melina Schools, MD [facility] [phone]

## 2020-01-04 NOTE — Progress Notes (Signed)
Occupational Therapy Evaluation   Patient educated in back precautions and how to adhere during self care tasks. Patient supervision level for LB and UB dressing with min cues for technique. Patient supervision level for bed mobility using log roll technique and with functional ambulation in room as patient states feeling a little whoozy "it's my first time up today." Provided patient hand out for back precautions to take home. Patient verbalizes + demonstrates adequate understanding of precautions, no further acute OT services indicated at this time.     01/04/20 0800  OT Visit Information  Last OT Received On 01/04/20  Assistance Needed +1  History of Present Illness Patient is a 74 year old female admitted for Left L2-3 decompression disectomy, Insitu Fusion Lumbar 2-3 (Left). PMH includes R TKA, CAD, angioplasty  Precautions  Precautions Back  Precaution Booklet Issued Yes (comment)  Precaution Comments apply brace in sitting, ok to ambulate to bathroom without, may take off to shower  Required Braces or Orthoses Spinal Brace  Spinal Brace TLSO  Restrictions  Weight Bearing Restrictions No  Home Living  Family/patient expects to be discharged to: Private residence  Living Arrangements Spouse/significant other  Available Help at Discharge Family;Available 24 hours/day  Type of [place] Two level;Able to live on main level with bedroom/bathroom  Print production planner Handicapped height  Home Equipment Shower seat;Toilet riser  Prior Function  Level of Independence Independent  Communication  Communication No difficulties  Pain Assessment  Pain Assessment 0-10  Pain Score 5  Pain Location back  Pain Descriptors / Indicators Operative site guarding;Burning  Pain Intervention(s) Premedicated before session  Cognition  Arousal/Alertness Awake/alert  Behavior During Therapy WFL for tasks assessed/performed   Overall Cognitive Status Within Functional Limits for tasks assessed  Upper Extremity Assessment  Upper Extremity Assessment Overall WFL for tasks assessed  Lower Extremity Assessment  Lower Extremity Assessment Defer to PT evaluation  Cervical / Trunk Assessment  Cervical / Trunk Assessment Normal  ADL  Overall ADL's  Needs assistance/impaired  Grooming Supervision/safety;Standing  Upper Body Bathing Supervision/ safety;Standing  Lower Body Bathing Supervison/ safety;Sit to/from stand  Upper Body Dressing  Supervision/safety;Sitting  Lower Body Dressing Supervision/safety;Sitting/lateral leans;Sit to/from stand;Adhering to back precautions;Cueing for sequencing  Toilet Transfer Supervision/safety  Toileting- Clothing Manipulation and Hygiene Supervision/safety  Functional mobility during ADLs Supervision/safety  General ADL Comments educate patient in back precautions and how to adhere to during self care tasks   Bed Mobility  Overal bed mobility Needs Assistance  Bed Mobility Rolling;Sidelying to Sit  Rolling Supervision  Sidelying to sit Supervision  General bed mobility comments educate in log roll technique  Transfers  Overall transfer level Needs assistance  Equipment used None  Transfers Sit to/from Stand  Sit to Stand Supervision  General transfer comment no physical assistance required, supervision for safety as patient states feels a little whoozy as it is her first time up this morning  Balance  Overall balance assessment Mild deficits observed, not formally tested  OT - End of Session  Activity Tolerance Patient tolerated treatment well  Patient left in bed;with call bell/phone within reach;Other (comment) (seated EOB)  Nurse Communication Mobility status  OT Assessment  OT Recommendation/Assessment Patient does not need any further OT services  OT Visit Diagnosis Other abnormalities of gait and mobility (R26.89);Pain  Pain - part of body  (back)  OT Problem  List Decreased activity tolerance;Decreased safety awareness;Decreased knowledge of precautions;Pain  AM-PAC OT "6 Clicks" Daily Activity Outcome Measure (Version 2)  Help from another person eating meals? 4  Help from another person taking care of personal grooming? 3  Help from another person toileting, which includes using toliet, bedpan, or urinal? 3  Help from another person bathing (including washing, rinsing, drying)? 3  Help from another person to put on and taking off regular upper body clothing? 3  Help from another person to put on and taking off regular lower body clothing? 3  6 Click Score 19  OT Recommendation  Follow Up Recommendations No OT follow up  OT Equipment None recommended by OT  Acute Rehab OT Goals  Patient Stated Goal get back to yardwork   OT Goal Formulation With patient  OT Time Calculation  OT Start Time (ACUTE ONLY) 0708  OT Stop Time (ACUTE ONLY) 0733  OT Time Calculation (min) 25 min  OT General Charges  $OT Visit 1 Visit  OT Evaluation  $OT Eval Low Complexity 1 Low  OT Treatments  $Self Care/Home Management  8-22 mins  Written Expression  Dominant Hand Right   Delbert Phenix OT OT pager: [phone]

## 2020-01-04 NOTE — Anesthesia Postprocedure Evaluation (Signed)
Anesthesia Post Note  Patient: Anne Martin  Procedure(s) Performed: Left L2-3 decompression disectomy, Insitu Fusion Lumbar 2-3 (Left Spine Lumbar)     Patient location during evaluation: PACU Anesthesia Type: General Level of consciousness: sedated and patient cooperative Pain management: pain level controlled Vital Signs Assessment: post-procedure vital signs reviewed and stable Respiratory status: spontaneous breathing Cardiovascular status: stable Anesthetic complications: no   No complications documented.  Last Vitals:  Vitals:   01/04/20 0347 01/04/20 0748  BP: 104/63 116/77  Pulse: 63 90  Resp: 18 16  Temp: 36.4 C 36.5 C  SpO2: 97% 100%    Last Pain:  Vitals:   01/04/20 0748  TempSrc: Oral  PainSc:                  Nolon Nations

## 2020-01-04 NOTE — Plan of Care (Signed)
Patient alert and oriented, mae's well, voiding adequate amount of urine, swallowing without difficulty, no c/o pain at time of discharge. Patient discharged home with family. Script and discharged instructions given to patient. Patient and family stated understanding of instructions given. Patient has an appointment with Dr. Brooks  

## 2020-01-04 NOTE — Evaluation (Signed)
Physical Therapy Evaluation Patient Details Name: Anne Martin MRN: [id number] DOB: Oct 06, 1945 Today's Date: 01/04/2020   History of Present Illness  The pt is a 74 yo female presenting s/p L2-3 decompression disectomy and fusion by Dr. Rolena Infante on 9/23. PMH includes: CAD, HTN, aortic stenosis, and ongoing low back and radicular LLE pain.    Clinical Impression  Pt in bed upon arrival of PT, agreeable to evaluation at this time. Prior to admission the pt was independent with mobility and ADLs without use of AD, but was limited by pain. The pt now presents with minor limitations in functional mobility, endurance, and dynamic stability due to above dx, but is safe to d/c home with family support when medically cleared. She was able to demo good hallway ambulation with minA initially, but progressed to minG with continued practice. The pt is highly motivated to return to prior level of activity and yard work, and will benefit from continued therapy to progress stability, strength, and biomechanics to reduce risk of fall or injury in the future.       Follow Up Recommendations Outpatient PT;Supervision for mobility/OOB (pt states she has 6 wks of OPPT arranged)    Equipment Recommendations  None recommended by PT    Recommendations for Other Services       Precautions / Restrictions Precautions Precautions: Back Precaution Booklet Issued: Yes (comment) Precaution Comments: apply brace in sitting, ok to ambulate to bathroom without, may take off to shower Required Braces or Orthoses: Spinal Brace Spinal Brace: Lumbar corset;Applied in sitting position;Other (comment) Spinal Brace Comments: can walk to bathroom without brace Restrictions Weight Bearing Restrictions: No      Mobility  Bed Mobility Overal bed mobility: Needs Assistance Bed Mobility: Rolling;Sidelying to Sit Rolling: Supervision Sidelying to sit: Supervision       General bed mobility comments: pt sitting EOB upon  arrival and departure of PT  Transfers Overall transfer level: Needs assistance Equipment used: None Transfers: Sit to/from Stand Sit to Stand: Supervision         General transfer comment: no physical assistance required, supervision for safety with initial stand  Ambulation/Gait Ambulation/Gait assistance: Min assist;Min guard Gait Distance (Feet): 20 Feet (+ 20 ft + 40 ft) Assistive device: 1 person hand held assist;None Gait Pattern/deviations: Step-through pattern;Decreased stance time - left;Decreased weight shift to left Gait velocity: 0.5 m/s Gait velocity interpretation: <1.8 ft/sec, indicate of risk for recurrent falls General Gait Details: pt with slow steps with decreased wt acceptance to LLE. no LOB during ambulation. progressed from minA through HHA to minG without UE support     Balance Overall balance assessment: Mild deficits observed, not formally tested                                           Pertinent Vitals/Pain Pain Assessment: Faces Pain Score: 5  Faces Pain Scale: Hurts little more Pain Location: back Pain Descriptors / Indicators: Operative site guarding;Burning Pain Intervention(s): Limited activity within patient's tolerance;Monitored during session    Home Living Family/patient expects to be discharged to:: Private residence Living Arrangements: Spouse/significant other Available Help at Discharge: Family;Available 24 hours/day Type of Home: House Home Access: Level entry     Home Layout: Two level;Able to live on main level with bedroom/bathroom Home Equipment: Shower seat;Toilet riser      Prior Function Level of Independence: Independent  Comments: limited by pain     Hand Dominance   Dominant Hand: Right    Extremity/Trunk Assessment   Upper Extremity Assessment Upper Extremity Assessment: Overall WFL for tasks assessed    Lower Extremity Assessment Lower Extremity Assessment: Overall WFL  for tasks assessed (equal strength bilaterally, no reports of difference in sensation)    Cervical / Trunk Assessment Cervical / Trunk Assessment: Other exceptions Cervical / Trunk Exceptions: s/p spinal surgery  Communication   Communication: No difficulties  Cognition Arousal/Alertness: Awake/alert Behavior During Therapy: WFL for tasks assessed/performed Overall Cognitive Status: Within Functional Limits for tasks assessed                                 General Comments: able to teach back 3/3 precautions      General Comments General comments (skin integrity, edema, etc.): discussed and practiced brace application        Assessment/Plan    PT Assessment Patient needs continued PT services  PT Problem List Decreased mobility;Decreased balance;Decreased activity tolerance;Pain       PT Treatment Interventions DME instruction;Therapeutic exercise;Gait training;Stair training;Functional mobility training;Therapeutic activities;Patient/family education;Balance training    PT Goals (Current goals can be found in the Care Plan section)  Acute Rehab PT Goals Patient Stated Goal: get back to yardwork  PT Goal Formulation: With patient Time For Goal Achievement: 01/18/20 Potential to Achieve Goals: Good    Frequency Min 5X/week    AM-PAC PT "6 Clicks" Mobility  Outcome Measure Help needed turning from your back to your side while in a flat bed without using bedrails?: A Little Help needed moving from lying on your back to sitting on the side of a flat bed without using bedrails?: A Little Help needed moving to and from a bed to a chair (including a wheelchair)?: A Little Help needed standing up from a chair using your arms (e.g., wheelchair or bedside chair)?: None Help needed to walk in hospital room?: A Little Help needed climbing 3-5 steps with a railing? : A Little 6 Click Score: 19    End of Session Equipment Utilized During Treatment: Gait belt;Back  brace Activity Tolerance: Patient tolerated treatment well Patient left: in bed;with call bell/phone within reach;with family/visitor present Nurse Communication: Mobility status PT Visit Diagnosis: Difficulty in walking, not elsewhere classified (R26.2)    Time: 2979-8921 (plus 5 min 845-900) PT Time Calculation (min) (ACUTE ONLY): 20 min   Charges:   PT Evaluation $PT Eval Low Complexity: 1 Low PT Treatments $Gait Training: 8-22 mins        Karma Ganja, PT, DPT   Acute Rehabilitation Department Pager #: [phone]  Otho Bellows 01/04/2020, 9:30 AM

## 2020-01-07 ENCOUNTER — Other Ambulatory Visit (HOSPITAL_COMMUNITY): Payer: Medicare Other

## 2020-01-07 ENCOUNTER — Inpatient Hospital Stay (HOSPITAL_COMMUNITY): Admission: RE | Admit: 2020-01-07 | Payer: Medicare Other | Source: Ambulatory Visit

## 2020-01-09 ENCOUNTER — Encounter: Payer: Medicare Other | Admitting: Family Medicine

## 2020-01-10 NOTE — Discharge Summary (Signed)
Patient ID: Anne Martin MRN: [id number] DOB/AGE: 09-11-1945 74 y.o.  Admit date: 01/03/2020 Discharge date: 01/10/2020  Admission Diagnoses:  Active Problems:   Lumbar herniated disc   Lumbar disc herniation   Discharge Diagnoses:  Active Problems:   Lumbar herniated disc   Lumbar disc herniation  status post Procedure(s): Left L2-3 decompression disectomy, Insitu Fusion Lumbar 2-3  Past Medical History:  Diagnosis Date  . Allergy    seasonal  . Anemia    prior to hysterectomy, fibroids  . Aortic insufficiency    a. 10/2015 Echo: mild to moderate AI.  Marland Kitchen Aortic stenosis    a. 10/2015 Echo: moderate AS.  Marland Kitchen Blood transfusion without reported diagnosis    with hysterectomy  . CAD (coronary artery disease)    a. 08/2009 s/p cath after abnormal nuclear study; LM nl, LAD 60%, D2 90% (PCI/DES), LCX 50 -60%, RCA nl, nl EF;  b. 2012 MV: no ischemia/infarct;  c. 08/2015 MV: no ischemia/infarct, EF 70%.  . Complication of anesthesia   . Diastolic dysfunction    a. 10/2015 Echo: EF 55-60%, no rwma, Gr1 DD, mod AS, mild to mod AI.  Marland Kitchen Essential hypertension   . H/O: rheumatic fever   . History of colonoscopy   . Hyperlipidemia   . Osteopenia    last DEXA 11-22-13   . PONV (postoperative nausea and vomiting)     Surgeries: Procedure(s): Left L2-3 decompression disectomy, Insitu Fusion Lumbar 2-3 on 01/03/2020   Consultants:   Discharged Condition: Improved  Hospital Course: Anne Martin is an 74 y.o. female who was admitted 01/03/2020 for operative treatment of L2-3 left posterior lateral disc herniation with cranial migration causing L2 and L3 nerve root compression and radicular pain.. Patient failed conservative treatments (please see the history and physical for the specifics) and had severe unremitting pain that affects sleep, daily activities and work/hobbies. After pre-op clearance, the patient was taken to the operating room on 01/03/2020 and underwent  Procedure(s): Left L2-3  decompression disectomy, Insitu Fusion Lumbar 2-3.    Patient was given perioperative antibiotics:  Anti-infectives (From admission, onward)   Start     Dose/Rate Route Frequency Ordered Stop   01/03/20 2330  ceFAZolin (ANCEF) IVPB 1 g/50 mL premix        1 g 100 mL/hr over 30 Minutes Intravenous Every 8 hours 01/03/20 1947 01/04/20 0701   01/03/20 1307  ceFAZolin (ANCEF) 2-4 GM/100ML-% IVPB       Note to Pharmacy: Tamsen Snider   : cabinet override      01/03/20 1307 01/03/20 1537   01/03/20 1254  ceFAZolin (ANCEF) IVPB 2g/100 mL premix        2 g 200 mL/hr over 30 Minutes Intravenous 30 min pre-op 01/03/20 1254 01/03/20 1524       Patient was given sequential compression devices and early ambulation to prevent DVT.   Patient benefited maximally from hospital stay and there were no complications. At the time of discharge, the patient was urinating/moving their bowels without difficulty, tolerating a regular diet, pain is controlled with oral pain medications and they have been cleared by PT/OT.   Recent vital signs: No data found.   Recent laboratory studies: No results for input(s): WBC, HGB, HCT, PLT, NA, K, CL, CO2, BUN, CREATININE, GLUCOSE, INR, CALCIUM in the last 72 hours.  Invalid input(s): PT, 2   Discharge Medications:   Allergies as of 01/04/2020      Reactions   Procaine Hcl Other (  See Comments)   Unknown      Medication List    STOP taking these medications   acetaminophen 500 MG tablet Commonly known as: TYLENOL   aspirin 81 MG tablet   traMADol 50 MG tablet Commonly known as: ULTRAM     TAKE these medications   atorvastatin 80 MG tablet Commonly known as: LIPITOR Take 1 tablet (80 mg total) by mouth daily at 6 PM.   Calcium 600 1500 (600 Ca) MG Tabs tablet Generic drug: calcium carbonate Take 600 mg of elemental calcium by mouth daily.   cholecalciferol 1000 units tablet Commonly known as: VITAMIN D Take 1,000 Units by mouth every evening.    Claritin 10 MG tablet Generic drug: loratadine Take 10 mg by mouth daily.   gabapentin 300 MG capsule Commonly known as: NEURONTIN Take 300 mg by mouth 3 (three) times daily.   isosorbide mononitrate 30 MG 24 hr tablet Commonly known as: IMDUR TAKE 1 TABLET BY MOUTH EVERY DAY   metoprolol succinate 25 MG 24 hr tablet Commonly known as: TOPROL-XL TAKE 1 TABLET BY MOUTH EVERY DAY   nitroGLYCERIN 0.4 MG SL tablet Commonly known as: NITROSTAT Place 1 tablet (0.4 mg total) under the tongue every 5 (five) minutes as needed. MAX 3 doses   ondansetron 4 MG tablet Commonly known as: Zofran Take 1 tablet (4 mg total) by mouth every 8 (eight) hours as needed for nausea or vomiting.   PROBIOTIC PO Take 1 tablet by mouth daily.   vitamin C 500 MG tablet Commonly known as: ASCORBIC ACID Take 500 mg by mouth at bedtime.   Yuvafem 10 MCG Tabs vaginal tablet Generic drug: Estradiol Place 10 mcg vaginally 2 (two) times a week.     ASK your doctor about these medications   methocarbamol 500 MG tablet Commonly known as: Robaxin Take 1 tablet (500 mg total) by mouth every 8 (eight) hours as needed for up to 5 days for muscle spasms. Ask about: Should I take this medication?   oxyCODONE-acetaminophen 10-325 MG tablet Commonly known as: Percocet Take 1 tablet by mouth every 6 (six) hours as needed for up to 5 days for pain. Ask about: Should I take this medication?       Diagnostic Studies: DG Chest 2 View  Result Date: 01/01/2020 CLINICAL DATA:  Preoperative EXAM: CHEST - 2 VIEW COMPARISON:  March 28, 2018, November 29, 2019 FINDINGS: The cardiomediastinal silhouette is normal in contour. No pleural effusion. No pneumothorax. No acute pleuroparenchymal abnormality. Visualized abdomen is unremarkable. Multilevel degenerative changes of the thoracic spine. IMPRESSION: No acute cardiopulmonary abnormality. Electronically Signed   By: Valentino Saxon MD   On: 01/01/2020 11:23   DG  Lumbar Spine 2-3 Views  Result Date: 01/03/2020 CLINICAL DATA:  L2-3 decompression EXAM: LUMBAR SPINE - 2-3 VIEW COMPARISON:  Lumbar MRI September 18, 2019 FINDINGS: Cross-table lateral lumbar study labeled image 1 shows probe tips posterior to the superior aspects of the L1 and midportion L2 vertebral bodies. No fracture or spondylolisthesis. There is severe disc space narrowing at L4-5. Cross-table lateral lumbar study labeled image 2 shows metallic probe with tip located posterior to the L2-3 interspace. There is severe disc space narrowing at L4-5. No fracture or spondylolisthesis. IMPRESSION: On final submitted cross-table lateral lumbar image, metallic probe tip is posterior to the L2-3 interspace. Severe disc space narrowing noted at L4-5. No fracture or spondylolisthesis. Critical Value/emergent results were called by telephone at the time of interpretation on 01/03/2020 at 4:03  pm to provider Shawn Route, RN, who verbally acknowledged these results. Electronically Signed   By: Lowella Grip III M.D.   On: 01/03/2020 16:03   DG Lumbar Spine 1 View  Result Date: 01/03/2020 CLINICAL DATA:  Instrument localization for L2-L3 decompression discectomy. EXAM: LUMBAR SPINE - 1 VIEW COMPARISON:  Localization radiograph earlier today. FINDINGS: Single cross-table lateral view of the lumbar spine with surgical instruments localizing posterior to L2-L3 disc space. Vertebral body heights are preserved. Air in the posterior soft tissues consistent with surgery. IMPRESSION: Surgical instruments localizing posterior to L2-L3 disc space. Electronically Signed   By: Keith Rake M.D.   On: 01/03/2020 16:56   DG Epidural/Nerve Root  Result Date: 12/20/2019 CLINICAL DATA:  Lumbosacral spondylosis without myelopathy with radiculopathy. Left buttock and leg pain. Large disc extrusion at L2-L3 affecting the exiting left L2 nerve root. Left L2-L3 transforaminal epidural injection requested. EXAM: EPIDURAL/NERVE ROOT  FLUOROSCOPY TIME:  Radiation Exposure Index (as provided by the fluoroscopic device): 1.9 mGy Fluoroscopy Time:  24 seconds Number of Acquired Images:  0 PROCEDURE: The procedure, risks, benefits, and alternatives were explained to the patient. Questions regarding the procedure were encouraged and answered. The patient understands and consents to the procedure. LEFT L2 NERVE ROOT BLOCK AND TRANSFORAMINAL EPIDURAL: A posterior oblique approach was taken to the intervertebral foramen on the left at L2-L3 using a curved 3.5 inch 22 gauge spinal needle. Injection of Isovue M 200 outlined the left L2 nerve root and showed good epidural spread. No vascular opacification is seen. 120 mg of Depo-Medrol mixed with 2 mL of 1% lidocaine were instilled. The procedure was well-tolerated, and the patient was discharged thirty minutes following the injection in good condition. COMPLICATIONS: None immediate. IMPRESSION: Technically successful injection consisting of a left L2 nerve root block and transforaminal epidural. Electronically Signed   By: Titus Dubin M.D.   On: 12/20/2019 15:10   ECHOCARDIOGRAM COMPLETE  Result Date: 12/31/2019    ECHOCARDIOGRAM REPORT   Patient Name:   Anne Martin  Date of Exam: 12/31/2019 Medical Rec #:  [id number]     Height:       65.0 in Accession #:    [id number]    Weight:       152.8 lb Date of Birth:  [birth year]    BSA:          1.764 m Patient Age:    58 years      BP:           138/78 mmHg Patient Gender: F             HR:           68 bpm. Exam Location:  [address] Procedure: 2D Echo, Cardiac Doppler and Color Doppler Indications:    I35.0 Aortic Stenosis  History:        Patient has prior history of Echocardiogram examinations, most                 recent 11/28/2018.  Sonographer:    Marygrace Drought RCS Referring Phys: Rolfe  1. Left ventricular ejection fraction, by estimation, is 60 to 65%. The left ventricle has normal function. The left ventricle has  no regional wall motion abnormalities. Left ventricular diastolic parameters are indeterminate.  2. Right ventricular systolic function is normal. The right ventricular size is normal. There is normal pulmonary artery systolic pressure.  3. Right atrial size was mildly dilated.  4. The mitral valve  is normal in structure. Trivial mitral valve regurgitation. No evidence of mitral stenosis.  5. The aortic valve is tricuspid. Aortic valve regurgitation is mild. Mild aortic valve stenosis.  6. The inferior vena cava is normal in size with greater than 50% respiratory variability, suggesting right atrial pressure of 3 mmHg. Comparison(s): No significant change from prior study. FINDINGS  Left Ventricle: Left ventricular ejection fraction, by estimation, is 60 to 65%. The left ventricle has normal function. The left ventricle has no regional wall motion abnormalities. The left ventricular internal cavity size was normal in size. There is  no left ventricular hypertrophy. Left ventricular diastolic parameters are indeterminate. Right Ventricle: The right ventricular size is normal. No increase in right ventricular wall thickness. Right ventricular systolic function is normal. There is normal pulmonary artery systolic pressure. The tricuspid regurgitant velocity is 2.44 m/s, and  with an assumed right atrial pressure of 3 mmHg, the estimated right ventricular systolic pressure is 27.0 mmHg. Left Atrium: Left atrial size was normal in size. Right Atrium: Right atrial size was mildly dilated. Pericardium: There is no evidence of pericardial effusion. Mitral Valve: The mitral valve is normal in structure. There is mild thickening of the mitral valve leaflet(s). There is mild calcification of the mitral valve leaflet(s). Trivial mitral valve regurgitation. No evidence of mitral valve stenosis. Tricuspid Valve: The tricuspid valve is normal in structure. Tricuspid valve regurgitation is trivial. No evidence of tricuspid  stenosis. Aortic Valve: The aortic valve is tricuspid. Aortic valve regurgitation is mild. Aortic regurgitation PHT measures 743 msec. Mild aortic stenosis is present. Aortic valve mean gradient measures 8.6 mmHg. Aortic valve peak gradient measures 15.4 mmHg. Aortic valve area, by VTI measures 1.13 cm. Pulmonic Valve: The pulmonic valve was not well visualized. Pulmonic valve regurgitation is not visualized. Aorta: The aortic root, ascending aorta and aortic arch are all structurally normal, with no evidence of dilitation or obstruction. Venous: The inferior vena cava is normal in size with greater than 50% respiratory variability, suggesting right atrial pressure of 3 mmHg. IAS/Shunts: The atrial septum is grossly normal.  LEFT VENTRICLE PLAX 2D LVIDd:         3.80 cm  Diastology LVIDs:         2.50 cm  LV e' medial:    6.31 cm/s LV PW:         1.20 cm  LV E/e' medial:  13.9 LV IVS:        1.00 cm  LV e' lateral:   6.74 cm/s LVOT diam:     1.60 cm  LV E/e' lateral: 13.0 LV SV:         51 LV SV Index:   29       2D Longitudinal Strain LVOT Area:     2.01 cm 2D Strain GLS (A2C):   -20.1 %                         2D Strain GLS (A3C):   -19.0 %                         2D Strain GLS (A4C):   -19.6 %                         2D Strain GLS Avg:     -19.6 % RIGHT VENTRICLE RV Basal diam:  2.70 cm RV S prime:  7.94 cm/s TAPSE (M-mode): 1.6 cm RVSP:           26.8 mmHg LEFT ATRIUM             Index       RIGHT ATRIUM           Index LA diam:        3.20 cm 1.81 cm/m  RA Pressure: 3.00 mmHg LA Vol (A2C):   40.0 ml 22.67 ml/m RA Area:     13.90 cm LA Vol (A4C):   34.1 ml 19.33 ml/m RA Volume:   32.70 ml  18.53 ml/m LA Biplane Vol: 39.5 ml 22.39 ml/m  AORTIC VALVE AV Area (Vmax):    1.06 cm AV Area (Vmean):   1.13 cm AV Area (VTI):     1.13 cm AV Vmax:           196.20 cm/s AV Vmean:          129.900 cm/s AV VTI:            0.451 m AV Peak Grad:      15.4 mmHg AV Mean Grad:      8.6 mmHg LVOT Vmax:         103.00  cm/s LVOT Vmean:        72.700 cm/s LVOT VTI:          0.253 m LVOT/AV VTI ratio: 0.56 AI PHT:            743 msec  AORTA Ao Root diam: 2.80 cm Ao Asc diam:  3.10 cm MITRAL VALVE                TRICUSPID VALVE MV Area (PHT):              TR Peak grad:   26mmHg MV Decel Time:              TR Vmax:        244.00 cm/s MV E velocity: 87.80 cm/s   Estimated RAP:  3.00 mmHg MV A velocity: 105.00 cm/s  RVSP:           27 mmHg MV E/A ratio:  0.84                             SHUNTS                             Systemic VTI:  0.25 m                             Systemic Diam: 1.60 cm Buford Dresser MD Electronically signed by Buford Dresser MD Signature Date/Time: 12/31/2019/9:43:16 PM    Final     Discharge Instructions    Incentive spirometry RT   Complete by: As directed        Follow-up Information    Melina Schools, MD. Schedule an appointment as soon as possible for a visit in 2 weeks.   Specialty: Orthopedic Surgery Why: For suture removal, For wound re-check, If symptoms worsen Contact information: [address] [place] [address] [phone]               Discharge Plan:  discharge to home  Disposition: stable    Signed: Yvonne Kendall Ellene Bloodsaw for [facility] PA-C [facility] [phone] 01/10/2020, 2:58 PM

## 2020-01-18 ENCOUNTER — Telehealth: Payer: Self-pay | Admitting: Family Medicine

## 2020-01-18 NOTE — Telephone Encounter (Signed)
Left message for patient to schedule Annual Wellness Visit.  Please schedule with Nurse Health Advisor Shannon Crews, RN at [facility]  

## 2020-01-21 DIAGNOSIS — Z23 Encounter for immunization: Secondary | ICD-10-CM | POA: Diagnosis not present

## 2020-02-04 DIAGNOSIS — Z23 Encounter for immunization: Secondary | ICD-10-CM | POA: Diagnosis not present

## 2020-02-12 DIAGNOSIS — H524 Presbyopia: Secondary | ICD-10-CM | POA: Diagnosis not present

## 2020-02-12 DIAGNOSIS — H35363 Drusen (degenerative) of macula, bilateral: Secondary | ICD-10-CM | POA: Diagnosis not present

## 2020-02-12 DIAGNOSIS — H25013 Cortical age-related cataract, bilateral: Secondary | ICD-10-CM | POA: Diagnosis not present

## 2020-02-12 DIAGNOSIS — H2513 Age-related nuclear cataract, bilateral: Secondary | ICD-10-CM | POA: Diagnosis not present

## 2020-02-14 ENCOUNTER — Encounter: Payer: Self-pay | Admitting: Family Medicine

## 2020-02-14 ENCOUNTER — Other Ambulatory Visit: Payer: Self-pay

## 2020-02-14 ENCOUNTER — Ambulatory Visit (INDEPENDENT_AMBULATORY_CARE_PROVIDER_SITE_OTHER): Payer: Medicare Other | Admitting: Family Medicine

## 2020-02-14 VITALS — BP 118/68 | HR 62 | Temp 97.7°F | Ht 64.0 in | Wt 160.0 lb

## 2020-02-14 DIAGNOSIS — M5126 Other intervertebral disc displacement, lumbar region: Secondary | ICD-10-CM

## 2020-02-14 DIAGNOSIS — I350 Nonrheumatic aortic (valve) stenosis: Secondary | ICD-10-CM | POA: Diagnosis not present

## 2020-02-14 DIAGNOSIS — I2510 Atherosclerotic heart disease of native coronary artery without angina pectoris: Secondary | ICD-10-CM | POA: Diagnosis not present

## 2020-02-14 DIAGNOSIS — I10 Essential (primary) hypertension: Secondary | ICD-10-CM

## 2020-02-14 DIAGNOSIS — E7800 Pure hypercholesterolemia, unspecified: Secondary | ICD-10-CM

## 2020-02-14 NOTE — Progress Notes (Signed)
   Subjective:    Patient ID: Anne Martin, female    DOB: July 27, 1945, 74 y.o.   MRN: [id number]  HPI Here to follow up on issues. She feels well in general . In September she had an L2-3 discectomy and this has gone well. She is wearing a lumbar support brace, and she hopes to begin PT in a few weeks. Her BP is stable. She recently saw Dr. Stanford Breed and he was pleased with her cardiac situation. She had an ECHO and the aortic stenosis/insufficiency seems to be stable.    Review of Systems  Constitutional: Negative.   HENT: Negative.   Eyes: Negative.   Respiratory: Negative.   Cardiovascular: Negative.   Gastrointestinal: Negative.   Genitourinary: Negative for decreased urine volume, difficulty urinating, dyspareunia, dysuria, enuresis, flank pain, frequency, hematuria, pelvic pain and urgency.  Musculoskeletal: Negative.   Skin: Negative.   Neurological: Negative.   Psychiatric/Behavioral: Negative.        Objective:   Physical Exam Constitutional:      General: She is not in acute distress.    Appearance: She is well-developed.  HENT:     Head: Normocephalic and atraumatic.     Right Ear: External ear normal.     Left Ear: External ear normal.     Nose: Nose normal.     Mouth/Throat:     Pharynx: No oropharyngeal exudate.  Eyes:     General: No scleral icterus.    Conjunctiva/sclera: Conjunctivae normal.     Pupils: Pupils are equal, round, and reactive to light.  Neck:     Thyroid: No thyromegaly.     Vascular: No JVD.  Cardiovascular:     Rate and Rhythm: Normal rate and regular rhythm.     Heart sounds: Normal heart sounds. No murmur heard.  No friction rub. No gallop.   Pulmonary:     Effort: Pulmonary effort is normal. No respiratory distress.     Breath sounds: Normal breath sounds. No wheezing or rales.  Chest:     Chest wall: No tenderness.  Abdominal:     General: Bowel sounds are normal. There is no distension.     Palpations: Abdomen is soft. There  is no mass.     Tenderness: There is no abdominal tenderness. There is no guarding or rebound.  Musculoskeletal:        General: No tenderness. Normal range of motion.     Cervical back: Normal range of motion and neck supple.  Lymphadenopathy:     Cervical: No cervical adenopathy.  Skin:    General: Skin is warm and dry.     Findings: No erythema or rash.  Neurological:     Mental Status: She is alert and oriented to person, place, and time.     Cranial Nerves: No cranial nerve deficit.     Motor: No abnormal muscle tone.     Coordination: Coordination normal.     Deep Tendon Reflexes: Reflexes are normal and symmetric. Reflexes normal.  Psychiatric:        Behavior: Behavior normal.        Thought Content: Thought content normal.        Judgment: Judgment normal.           Assessment & Plan:  Her HTN, CAD, and valvular disease are stable. She is recovering from recent back surgery. We will get fasting labs to check lipids, etc. She is UTD on immunizations.  Alysia Penna, MD

## 2020-02-15 DIAGNOSIS — M532X6 Spinal instabilities, lumbar region: Secondary | ICD-10-CM | POA: Diagnosis not present

## 2020-02-15 DIAGNOSIS — M5116 Intervertebral disc disorders with radiculopathy, lumbar region: Secondary | ICD-10-CM | POA: Diagnosis not present

## 2020-02-15 DIAGNOSIS — Z4889 Encounter for other specified surgical aftercare: Secondary | ICD-10-CM | POA: Diagnosis not present

## 2020-02-15 LAB — CBC WITH DIFFERENTIAL/PLATELET
Absolute Monocytes: 640 cells/uL (ref 200–950)
Basophils Absolute: 49 cells/uL (ref 0–200)
Basophils Relative: 0.6 %
Eosinophils Absolute: 267 cells/uL (ref 15–500)
Eosinophils Relative: 3.3 %
HCT: 45.5 % — ABNORMAL HIGH (ref 35.0–45.0)
Hemoglobin: 14.6 g/dL (ref 11.7–15.5)
Lymphs Abs: 2066 cells/uL (ref 850–3900)
MCH: 26.5 pg — ABNORMAL LOW (ref 27.0–33.0)
MCHC: 32.1 g/dL (ref 32.0–36.0)
MCV: 82.7 fL (ref 80.0–100.0)
MPV: 10.7 fL (ref 7.5–12.5)
Monocytes Relative: 7.9 %
Neutro Abs: 5079 cells/uL (ref 1500–7800)
Neutrophils Relative %: 62.7 %
Platelets: 228 10*3/uL (ref 140–400)
RBC: 5.5 10*6/uL — ABNORMAL HIGH (ref 3.80–5.10)
RDW: 13.6 % (ref 11.0–15.0)
Total Lymphocyte: 25.5 %
WBC: 8.1 10*3/uL (ref 3.8–10.8)

## 2020-02-15 LAB — LIPID PANEL
Cholesterol: 135 mg/dL (ref <200)
HDL: 71 mg/dL (ref >=50)
LDL Cholesterol (Calc): 46 mg/dL (calc)
Non-HDL Cholesterol (Calc): 64 mg/dL (calc) (ref <130)
Total CHOL/HDL Ratio: 1.9 (calc) (ref <5.0)
Triglycerides: 98 mg/dL (ref <150)

## 2020-02-15 LAB — HEPATIC FUNCTION PANEL
AG Ratio: 2 (calc) (ref 1.0–2.5)
ALT: 27 U/L (ref 6–29)
AST: 24 U/L (ref 10–35)
Albumin: 4.3 g/dL (ref 3.6–5.1)
Alkaline phosphatase (APISO): 120 U/L (ref 37–153)
Bilirubin, Direct: 0.2 mg/dL (ref 0.0–0.2)
Globulin: 2.2 g/dL (calc) (ref 1.9–3.7)
Indirect Bilirubin: 0.7 mg/dL (calc) (ref 0.2–1.2)
Total Bilirubin: 0.9 mg/dL (ref 0.2–1.2)
Total Protein: 6.5 g/dL (ref 6.1–8.1)

## 2020-02-15 LAB — BASIC METABOLIC PANEL WITH GFR
BUN: 16 mg/dL (ref 7–25)
CO2: 31 mmol/L (ref 20–32)
Calcium: 10.3 mg/dL (ref 8.6–10.4)
Chloride: 102 mmol/L (ref 98–110)
Creat: 0.74 mg/dL (ref 0.60–0.93)
GFR, Est African American: 92 mL/min/{1.73_m2} (ref >=60)
GFR, Est Non African American: 80 mL/min/{1.73_m2} (ref >=60)
Glucose, Bld: 80 mg/dL (ref 65–99)
Potassium: 4.2 mmol/L (ref 3.5–5.3)
Sodium: 142 mmol/L (ref 135–146)

## 2020-02-15 LAB — T3, FREE: T3, Free: 2.9 pg/mL (ref 2.3–4.2)

## 2020-02-15 LAB — T4, FREE: Free T4: 1.2 ng/dL (ref 0.8–1.8)

## 2020-02-15 LAB — TSH: TSH: 3.38 mIU/L (ref 0.40–4.50)

## 2020-02-20 DIAGNOSIS — Z4789 Encounter for other orthopedic aftercare: Secondary | ICD-10-CM | POA: Diagnosis not present

## 2020-02-20 DIAGNOSIS — M5459 Other low back pain: Secondary | ICD-10-CM | POA: Diagnosis not present

## 2020-02-22 DIAGNOSIS — Z4789 Encounter for other orthopedic aftercare: Secondary | ICD-10-CM | POA: Diagnosis not present

## 2020-02-22 DIAGNOSIS — M5459 Other low back pain: Secondary | ICD-10-CM | POA: Diagnosis not present

## 2020-02-26 DIAGNOSIS — Z4789 Encounter for other orthopedic aftercare: Secondary | ICD-10-CM | POA: Diagnosis not present

## 2020-02-26 DIAGNOSIS — M5459 Other low back pain: Secondary | ICD-10-CM | POA: Diagnosis not present

## 2020-02-29 DIAGNOSIS — Z4789 Encounter for other orthopedic aftercare: Secondary | ICD-10-CM | POA: Diagnosis not present

## 2020-02-29 DIAGNOSIS — M5459 Other low back pain: Secondary | ICD-10-CM | POA: Diagnosis not present

## 2020-03-03 DIAGNOSIS — M5459 Other low back pain: Secondary | ICD-10-CM | POA: Diagnosis not present

## 2020-03-03 DIAGNOSIS — Z4789 Encounter for other orthopedic aftercare: Secondary | ICD-10-CM | POA: Diagnosis not present

## 2020-03-05 DIAGNOSIS — Z4789 Encounter for other orthopedic aftercare: Secondary | ICD-10-CM | POA: Diagnosis not present

## 2020-03-05 DIAGNOSIS — M5459 Other low back pain: Secondary | ICD-10-CM | POA: Diagnosis not present

## 2020-03-11 DIAGNOSIS — M5459 Other low back pain: Secondary | ICD-10-CM | POA: Diagnosis not present

## 2020-03-11 DIAGNOSIS — Z4789 Encounter for other orthopedic aftercare: Secondary | ICD-10-CM | POA: Diagnosis not present

## 2020-03-14 DIAGNOSIS — M5459 Other low back pain: Secondary | ICD-10-CM | POA: Diagnosis not present

## 2020-03-14 DIAGNOSIS — Z4789 Encounter for other orthopedic aftercare: Secondary | ICD-10-CM | POA: Diagnosis not present

## 2020-03-18 DIAGNOSIS — M5459 Other low back pain: Secondary | ICD-10-CM | POA: Diagnosis not present

## 2020-03-18 DIAGNOSIS — Z4789 Encounter for other orthopedic aftercare: Secondary | ICD-10-CM | POA: Diagnosis not present

## 2020-03-20 DIAGNOSIS — Z4789 Encounter for other orthopedic aftercare: Secondary | ICD-10-CM | POA: Diagnosis not present

## 2020-03-20 DIAGNOSIS — M5459 Other low back pain: Secondary | ICD-10-CM | POA: Diagnosis not present

## 2020-03-27 DIAGNOSIS — Z4889 Encounter for other specified surgical aftercare: Secondary | ICD-10-CM | POA: Diagnosis not present

## 2020-04-01 DIAGNOSIS — Z4889 Encounter for other specified surgical aftercare: Secondary | ICD-10-CM | POA: Diagnosis not present

## 2020-04-02 DIAGNOSIS — Z4889 Encounter for other specified surgical aftercare: Secondary | ICD-10-CM | POA: Diagnosis not present

## 2020-04-08 DIAGNOSIS — Z4889 Encounter for other specified surgical aftercare: Secondary | ICD-10-CM | POA: Diagnosis not present

## 2020-04-09 DIAGNOSIS — Z4889 Encounter for other specified surgical aftercare: Secondary | ICD-10-CM | POA: Diagnosis not present

## 2020-04-17 ENCOUNTER — Encounter: Payer: Self-pay | Admitting: Family Medicine

## 2020-04-18 ENCOUNTER — Other Ambulatory Visit: Payer: Self-pay

## 2020-04-18 MED ORDER — ISOSORBIDE MONONITRATE ER 30 MG PO TB24
30.0000 mg | ORAL_TABLET | Freq: Every day | ORAL | 3 refills | Status: DC
Start: 2020-04-18 — End: 2021-03-30

## 2020-04-18 MED ORDER — AZITHROMYCIN 250 MG PO TABS: ORAL_TABLET | ORAL | 0 refills | Status: DC

## 2020-04-18 NOTE — Telephone Encounter (Signed)
Call in a [employer]pack  ?

## 2020-04-21 ENCOUNTER — Other Ambulatory Visit: Payer: Self-pay | Admitting: Family Medicine

## 2020-05-12 LAB — HM MAMMOGRAPHY

## 2020-05-22 ENCOUNTER — Ambulatory Visit (INDEPENDENT_AMBULATORY_CARE_PROVIDER_SITE_OTHER): Payer: Medicare Other

## 2020-05-22 DIAGNOSIS — Z0000 Encounter for general adult medical examination without abnormal findings: Secondary | ICD-10-CM

## 2020-05-22 NOTE — Progress Notes (Signed)
Subjective:   ELANI DELPH is a 75 y.o. female who presents for an Initial Medicare Annual Wellness Visit.  I connected with Graycee Greeson   today by telephone and verified that I am speaking with the correct person using two identifiers. Location patient: home Location provider: work Persons participating in the virtual visit: patient, provider.   I discussed the limitations, risks, security and privacy concerns of performing an evaluation and management service by telephone and the availability of in person appointments. I also discussed with the patient that there may be a patient responsible charge related to this service. The patient expressed understanding and verbally consented to this telephonic visit.    Interactive audio and video telecommunications were attempted between this provider and patient, however failed, due to patient having technical difficulties OR patient did not have access to video capability.  We continued and completed visit with audio only.      Review of Systems    N/A  Cardiac Risk Factors include: advanced age (>38men, >65 women);hypertension;dyslipidemia     Objective:    Today's Vitals   05/22/20 1452  PainSc: 5    There is no height or weight on file to calculate BMI.  Advanced Directives 05/22/2020 01/01/2020 03/29/2018 10/28/2016 10/27/2016 08/15/2016 02/28/2016  Does Patient Have a Medical Advance Directive? Yes Yes Yes Yes Yes Yes No  Type of Paramedic of [place];Living will [place];Living will Healthcare Power of [place];Living will [place];Living will Living will;Healthcare Power of Attorney -  Does patient want to make changes to medical advance directive? No - Patient declined No - Patient declined No - Patient declined No - Patient declined - - -  Copy of [place] in Chart? No - copy requested No - copy requested No - copy  requested No - copy requested - - -    Current Medications (verified) Outpatient Encounter Medications as of 05/22/2020  Medication Sig  . atorvastatin (LIPITOR) 80 MG tablet Take 1 tablet (80 mg total) by mouth daily at 6 PM.  . calcium carbonate (OSCAL) 1500 (600 Ca) MG TABS tablet Take 600 mg of elemental calcium by mouth daily.  . cholecalciferol (VITAMIN D) 1000 UNITS tablet Take 1,000 Units by mouth every evening.   . isosorbide mononitrate (IMDUR) 30 MG 24 hr tablet Take 1 tablet (30 mg total) by mouth daily.  Marland Kitchen loratadine (CLARITIN) 10 MG tablet Take 10 mg by mouth daily.   . metoprolol succinate (TOPROL-XL) 25 MG 24 hr tablet TAKE 1 TABLET BY MOUTH EVERY DAY (Patient taking differently: Take 25 mg by mouth daily.)  . Probiotic Product (PROBIOTIC PO) Take 1 tablet by mouth daily.   . vitamin C (ASCORBIC ACID) 500 MG tablet Take 500 mg by mouth at bedtime.  Merril Abbe 10 MCG TABS vaginal tablet Place 10 mcg vaginally 2 (two) times a week.   Marland Kitchen azithromycin (ZITHROMAX) 250 MG tablet Use as directed  . nitroGLYCERIN (NITROSTAT) 0.4 MG SL tablet Place 1 tablet (0.4 mg total) under the tongue every 5 (five) minutes as needed. MAX 3 doses (Patient not taking: Reported on 05/22/2020)  . [DISCONTINUED] gabapentin (NEURONTIN) 300 MG capsule Take 300 mg by mouth 3 (three) times daily.    No facility-administered encounter medications on file as of 05/22/2020.    Allergies (verified) Procaine hcl   History: Past Medical History:  Diagnosis Date  . Allergy    seasonal  .  Anemia    prior to hysterectomy, fibroids  . Aortic insufficiency    a. 10/2015 Echo: mild to moderate AI.  Marland Kitchen Aortic stenosis    a. 10/2015 Echo: moderate AS.  Marland Kitchen Blood transfusion without reported diagnosis    with hysterectomy  . CAD (coronary artery disease)    a. 08/2009 s/p cath after abnormal nuclear study; LM nl, LAD 60%, D2 90% (PCI/DES), LCX 50 -60%, RCA nl, nl EF;  b. 2012 MV: no ischemia/infarct;  c. 08/2015 MV:  no ischemia/infarct, EF 70%.  . Complication of anesthesia   . Diastolic dysfunction    a. 10/2015 Echo: EF 55-60%, no rwma, Gr1 DD, mod AS, mild to mod AI.  Marland Kitchen Essential hypertension   . H/O: rheumatic fever   . History of colonoscopy   . Hyperlipidemia   . Osteopenia    last DEXA 11-22-13   . PONV (postoperative nausea and vomiting)    Past Surgical History:  Procedure Laterality Date  . ANGIOPLASTY  may 2011   stents  . AORTIC ARCH ANGIOGRAPHY N/A 03/29/2018   Procedure: AORTIC ARCH ANGIOGRAPHY;  Surgeon: Nelva Bush, MD;  Location: [facility];  Service: Cardiovascular;  Laterality: N/A;  . CHOLECYSTECTOMY  nov 2004  . COLONOSCOPY  02/02/2016   per Dr. Henrene Pastor, adenomatous polyps, repeat in 5 yrs   . INTRAVASCULAR PRESSURE WIRE/FFR STUDY N/A 03/29/2018   Procedure: INTRAVASCULAR PRESSURE WIRE/FFR STUDY;  Surgeon: Nelva Bush, MD;  Location: [facility];  Service: Cardiovascular;  Laterality: N/A;  . KNEE ARTHROSCOPY     Rt. Knee  . LEFT HEART CATH AND CORONARY ANGIOGRAPHY N/A 03/29/2018   Procedure: LEFT HEART CATH AND CORONARY ANGIOGRAPHY;  Surgeon: Nelva Bush, MD;  Location: [facility];  Service: Cardiovascular;  Laterality: N/A;  . LUMBAR LAMINECTOMY/DECOMPRESSION MICRODISCECTOMY Left 01/03/2020   Procedure: Left L2-3 decompression disectomy, Insitu Fusion Lumbar 2-3;  Surgeon: Melina Schools, MD;  Location: [facility];  Service: Orthopedics;  Laterality: Left;  . TONSILLECTOMY    . TONSILLECTOMY    . VAGINAL HYSTERECTOMY  1994   Family History  Problem Relation Age of Onset  . Heart disease Mother   . Heart disease Father        CABG  . Heart disease Brother        2 stents  . Ulcerative colitis Daughter   . Colon cancer Neg Hx    Social History   Socioeconomic History  . Marital status: Married    Spouse name: Not on file  . Number of children: 2  . Years of education: Not on file  . Highest education level: Not on file   Occupational History  . Occupation: Retired Product manager: RETIRED  Tobacco Use  . Smoking status: Former Smoker    Types: Cigarettes    Quit date: 04/12/1977    Years since quitting: 43.1  . Smokeless tobacco: Never Used  . Tobacco comment: quit age 86  Vaping Use  . Vaping Use: Never used  Substance and Sexual Activity  . Alcohol use: Yes    Alcohol/week: 0.0 standard drinks    Comment: rarely  . Drug use: No  . Sexual activity: Not Currently  Other Topics Concern  . Not on file  Social History Narrative  . Not on file   Social Determinants of Health   Financial Resource Strain: Low Risk   . Difficulty of Paying Living Expenses: Not hard at all  Food Insecurity: No Food  Insecurity  . Worried About Charity fundraiser in the Last Year: Never true  . Ran Out of Food in the Last Year: Never true  Transportation Needs: No Transportation Needs  . Lack of Transportation (Medical): No  . Lack of Transportation (Non-Medical): No  Physical Activity: Inactive  . Days of Exercise per Week: 0 days  . Minutes of Exercise per Session: 0 min  Stress: No Stress Concern Present  . Feeling of Stress : Not at all  Social Connections: Socially Integrated  . Frequency of Communication with Friends and Family: More than three times a week  . Frequency of Social Gatherings with Friends and Family: More than three times a week  . Attends Religious Services: 1 to 4 times per year  . Active Member of Clubs or Organizations: Yes  . Attends Archivist Meetings: More than 4 times per year  . Marital Status: Married    Tobacco Counseling Counseling given: Not Answered Comment: quit age 31   Clinical Intake:  Pre-visit preparation completed: Yes  Pain : 0-10 Pain Score: 5  Pain Type: Chronic pain Pain Location: Back Pain Orientation: Lower Pain Descriptors / Indicators: Aching,Dull Pain Onset: More than a month ago Pain Frequency: Intermittent Pain Relieving  Factors: Resting, heat and ice  Pain Relieving Factors: Resting, heat and ice  Nutritional Risks: None Diabetes: No  How often do you need to have someone help you when you read instructions, pamphlets, or other written materials from your doctor or pharmacy?: 1 - Never What is the last grade level you completed in school?: College  Diabetic?No   Interpreter Needed?: No  Information entered by :: [place] of Daily Living In your present state of health, do you have any difficulty performing the following activities: 05/22/2020 01/01/2020  Hearing? N N  Vision? N N  Difficulty concentrating or making decisions? N N  Walking or climbing stairs? N N  Dressing or bathing? N N  Doing errands, shopping? N N  Preparing Food and eating ? N -  Using the Toilet? N -  In the past six months, have you accidently leaked urine? N -  Do you have problems with loss of bowel control? N -  Managing your Medications? N -  Managing your Finances? N -  Housekeeping or managing your Housekeeping? N -  Some recent data might be hidden    Patient Care Team: Laurey Morale, MD as PCP - General Stanford Breed Denice Bors, MD as PCP - Cardiology (Cardiology) Melina Schools, MD as Consulting Physician (Orthopedic Surgery)  Indicate any recent Medical Services you may have received from other than [facility] providers in the past year (date may be approximate).     Assessment:   This is a routine wellness examination for Jacquelina.  Hearing/Vision screen  Hearing Screening   125Hz  250Hz  500Hz  1000Hz  2000Hz  3000Hz  4000Hz  6000Hz  8000Hz   Right ear:           Left ear:           Vision Screening Comments: Patient states gets eyes examined once every 2 years. Wears glasses for reading    Dietary issues and exercise activities discussed: Current Exercise Habits: The patient does not participate in regular exercise at present, Exercise limited by: orthopedic condition(s)  Goals    . Exercise 150  min/wk Moderate Activity      Depression Screen PHQ 2/9 Scores 05/22/2020 02/14/2020 01/05/2018 11/16/2016 01/09/2015  PHQ - 2 Score 0 0 0  0 0  PHQ- 9 Score - 0 - - -    Fall Risk Fall Risk  05/22/2020 01/05/2018 11/16/2016 03/18/2016 01/09/2015  Falls in the past year? 0 No Yes No No  Comment - - - Emmi Telephone Survey: data to providers prior to load -  Number falls in past yr: 0 - 1 - -  Injury with Fall? 0 - Yes - -  Risk for fall due to : No Fall Risks - - - -  Follow up Falls evaluation completed;Falls prevention discussed - Falls evaluation completed - -    FALL RISK PREVENTION PERTAINING TO THE HOME:  Any stairs in or around the home? Yes  If so, are there any without handrails? No  Home free of loose throw rugs in walkways, pet beds, electrical cords, etc? Yes  Adequate lighting in your home to reduce risk of falls? Yes   ASSISTIVE DEVICES UTILIZED TO PREVENT FALLS:  Life alert? No  Use of a cane, walker or w/c? No  Grab bars in the bathroom? Yes  Shower chair or bench in shower? Yes  Elevated toilet seat or a handicapped toilet? No     Cognitive Function: Normal cognitive status assessed by direct observation by this Nurse Health Advisor. No abnormalities found.          Immunizations Immunization History  Administered Date(s) Administered  . Influenza Split 01/20/2011, 01/13/2012  . Influenza Whole 01/19/2008, 01/21/2010  . Influenza, High Dose Seasonal PF 01/05/2018, 02/04/2020  . Influenza, Seasonal, Injecte, Preservative Fre 12/22/2018  . Influenza-Unspecified 01/01/2014, 01/20/2015, 01/19/2016  . PFIZER(Purple Top)SARS-COV-2 Vaccination 04/21/2019, 05/12/2019, 01/21/2020  . Pneumococcal Conjugate-13 11/13/2015  . Pneumococcal Polysaccharide-23 01/05/2018  . Tdap 11/13/2015  . Zoster 08/16/2013  . Zoster Recombinat (Shingrix) 01/11/2019, 03/16/2019    TDAP status: Up to date  Flu Vaccine status: Up to date  Pneumococcal vaccine status: Up to  date  Covid-19 vaccine status: Completed vaccines  Qualifies for Shingles Vaccine? Yes   Zostavax completed Yes   Shingrix Completed?: Yes  Screening Tests Health Maintenance  Topic Date Due  . Hepatitis C Screening  Never done  . COVID-19 Vaccine (4 - Booster for Pfizer series) 07/21/2020  . COLONOSCOPY (Pts 45-66yrs Insurance coverage will need to be confirmed)  02/01/2021  . TETANUS/TDAP  11/12/2025  . INFLUENZA VACCINE  Completed  . DEXA SCAN  Completed  . PNA vac Low Risk Adult  Completed    Health Maintenance  Health Maintenance Due  Topic Date Due  . Hepatitis C Screening  Never done    Colorectal cancer screening: Type of screening: Colonoscopy. Completed 02/02/2016. Repeat every 5 years  Mammogram status: Completed 05/14/2019. Repeat every year  Bone Density status: Completed 10/03/2019. Results reflect: Bone density results: OSTEOPENIA. Repeat every 5 years.  Lung Cancer Screening: (Low Dose CT Chest recommended if Age 55-80 years, 30 pack-year currently smoking OR have quit w/in 15years.) does not qualify.   Lung Cancer Screening Referral: N/A   Additional Screening:  Hepatitis C Screening: does qualify;   Vision Screening: Recommended annual ophthalmology exams for early detection of glaucoma and other disorders of the eye. Is the patient up to date with their annual eye exam?  Yes  Who is the provider or what is the name of the office in which the patient attends annual eye exams? [facility]  If pt is not established with a provider, would they like to be referred to a provider to establish care? No .   Dental  Screening: Recommended annual dental exams for proper oral hygiene  Community Resource Referral / Chronic Care Management: CRR required this visit?  No   CCM required this visit?  No      Plan:     I have personally reviewed and noted the following in the patient's chart:   . Medical and social history . Use of alcohol, tobacco  or illicit drugs  . Current medications and supplements . Functional ability and status . Nutritional status . Physical activity . Advanced directives . List of other physicians . Hospitalizations, surgeries, and ER visits in previous 12 months . Vitals . Screenings to include cognitive, depression, and falls . Referrals and appointments  In addition, I have reviewed and discussed with patient certain preventive protocols, quality metrics, and best practice recommendations. A written personalized care plan for preventive services as well as general preventive health recommendations were provided to patient.     Ofilia Neas, LPN   08/15/1831   Nurse Notes: None

## 2020-05-22 NOTE — Patient Instructions (Signed)
Ms. Anne Martin , Thank you for taking time to come for your Medicare Wellness Visit. I appreciate your ongoing commitment to your health goals. Please review the following plan we discussed and let me know if I can assist you in the future.   Screening recommendations/referrals: Colonoscopy: Up to date, next due 02/01/2021 Mammogram: Currently due as of 05/13/2020. Please let us know when you would like to have this set up. Bone Density: Up to date, next due 10/02/2024 Recommended yearly ophthalmology/optometry visit for glaucoma screening and checkup Recommended yearly dental visit for hygiene and checkup  Vaccinations: Influenza vaccine: Up to date, next due fall 2022  Pneumococcal vaccine: Completed series  Tdap vaccine: Up to date, Next due 11/12/2025 Shingles vaccine: Completed series     Advanced directives: Please bring in copies of your advanced medical directives so that we may scan into your chart   Conditions/risks identified: None   Next appointment: None    Preventive Care 55 Years and Older, Female Preventive care refers to lifestyle choices and visits with your health care provider that can promote health and wellness. What does preventive care include?  A yearly physical exam. This is also called an annual well check.  Dental exams once or twice a year.  Routine eye exams. Ask your health care provider how often you should have your eyes checked.  Personal lifestyle choices, including:  Daily care of your teeth and gums.  Regular physical activity.  Eating a healthy diet.  Avoiding tobacco and drug use.  Limiting alcohol use.  Practicing safe sex.  Taking low-dose aspirin every day.  Taking vitamin and mineral supplements as recommended by your health care provider. What happens during an annual well check? The services and screenings done by your health care provider during your annual well check will depend on your age, overall health, lifestyle risk  factors, and family history of disease. Counseling  Your health care provider may ask you questions about your:  Alcohol use.  Tobacco use.  Drug use.  Emotional well-being.  Home and relationship well-being.  Sexual activity.  Eating habits.  History of falls.  Memory and ability to understand (cognition).  Work and work Statistician.  Reproductive health. Screening  You may have the following tests or measurements:  Height, weight, and BMI.  Blood pressure.  Lipid and cholesterol levels. These may be checked every 5 years, or more frequently if you are over 65 years old.  Skin check.  Lung cancer screening. You may have this screening every year starting at age 74 if you have a 30-pack-year history of smoking and currently smoke or have quit within the past 15 years.  Fecal occult blood test (FOBT) of the stool. You may have this test every year starting at age 19.  Flexible sigmoidoscopy or colonoscopy. You may have a sigmoidoscopy every 5 years or a colonoscopy every 10 years starting at age 65.  Hepatitis C blood test.  Hepatitis B blood test.  Sexually transmitted disease (STD) testing.  Diabetes screening. This is done by checking your blood sugar (glucose) after you have not eaten for a while (fasting). You may have this done every 1-3 years.  Bone density scan. This is done to screen for osteoporosis. You may have this done starting at age 19.  Mammogram. This may be done every 1-2 years. Talk to your health care provider about how often you should have regular mammograms. Talk with your health care provider about your test results, treatment options, and  if necessary, the need for more tests. Vaccines  Your health care provider may recommend certain vaccines, such as:  Influenza vaccine. This is recommended every year.  Tetanus, diphtheria, and acellular pertussis (Tdap, Td) vaccine. You may need a Td booster every 10 years.  Zoster vaccine. You  may need this after age 84.  Pneumococcal 13-valent conjugate (PCV13) vaccine. One dose is recommended after age 74.  Pneumococcal polysaccharide (PPSV23) vaccine. One dose is recommended after age 47. Talk to your health care provider about which screenings and vaccines you need and how often you need them. This information is not intended to replace advice given to you by your health care provider. Make sure you discuss any questions you have with your health care provider. Document Released: 04/25/2015 Document Revised: 12/17/2015 Document Reviewed: 01/28/2015 Elsevier Interactive Patient Education  2017 [place] Prevention in the Home Falls can cause injuries. They can happen to people of all ages. There are many things you can do to make your home safe and to help prevent falls. What can I do on the outside of my home?  Regularly fix the edges of walkways and driveways and fix any cracks.  Remove anything that might make you trip as you walk through a door, such as a raised step or threshold.  Trim any bushes or trees on the path to your home.  Use bright outdoor lighting.  Clear any walking paths of anything that might make someone trip, such as rocks or tools.  Regularly check to see if handrails are loose or broken. Make sure that both sides of any steps have handrails.  Any raised decks and porches should have guardrails on the edges.  Have any leaves, snow, or ice cleared regularly.  Use sand or salt on walking paths during winter.  Clean up any spills in your garage right away. This includes oil or grease spills. What can I do in the bathroom?  Use night lights.  Install grab bars by the toilet and in the tub and shower. Do not use towel bars as grab bars.  Use non-skid mats or decals in the tub or shower.  If you need to sit down in the shower, use a plastic, non-slip stool.  Keep the floor dry. Clean up any water that spills on the floor as soon as  it happens.  Remove soap buildup in the tub or shower regularly.  Attach bath mats securely with double-sided non-slip rug tape.  Do not have throw rugs and other things on the floor that can make you trip. What can I do in the bedroom?  Use night lights.  Make sure that you have a light by your bed that is easy to reach.  Do not use any sheets or blankets that are too big for your bed. They should not hang down onto the floor.  Have a firm chair that has side arms. You can use this for support while you get dressed.  Do not have throw rugs and other things on the floor that can make you trip. What can I do in the kitchen?  Clean up any spills right away.  Avoid walking on wet floors.  Keep items that you use a lot in easy-to-reach places.  If you need to reach something above you, use a strong step stool that has a grab bar.  Keep electrical cords out of the way.  Do not use floor polish or wax that makes floors slippery. If you  must use wax, use non-skid floor wax.  Do not have throw rugs and other things on the floor that can make you trip. What can I do with my stairs?  Do not leave any items on the stairs.  Make sure that there are handrails on both sides of the stairs and use them. Fix handrails that are broken or loose. Make sure that handrails are as long as the stairways.  Check any carpeting to make sure that it is firmly attached to the stairs. Fix any carpet that is loose or worn.  Avoid having throw rugs at the top or bottom of the stairs. If you do have throw rugs, attach them to the floor with carpet tape.  Make sure that you have a light switch at the top of the stairs and the bottom of the stairs. If you do not have them, ask someone to add them for you. What else can I do to help prevent falls?  Wear shoes that:  Do not have high heels.  Have rubber bottoms.  Are comfortable and fit you well.  Are closed at the toe. Do not wear sandals.  If  you use a stepladder:  Make sure that it is fully opened. Do not climb a closed stepladder.  Make sure that both sides of the stepladder are locked into place.  Ask someone to hold it for you, if possible.  Clearly mark and make sure that you can see:  Any grab bars or handrails.  First and last steps.  Where the edge of each step is.  Use tools that help you move around (mobility aids) if they are needed. These include:  Canes.  Walkers.  Scooters.  Crutches.  Turn on the lights when you go into a dark area. Replace any light bulbs as soon as they burn out.  Set up your furniture so you have a clear path. Avoid moving your furniture around.  If any of your floors are uneven, fix them.  If there are any pets around you, be aware of where they are.  Review your medicines with your doctor. Some medicines can make you feel dizzy. This can increase your chance of falling. Ask your doctor what other things that you can do to help prevent falls. This information is not intended to replace advice given to you by your health care provider. Make sure you discuss any questions you have with your health care provider. Document Released: 01/23/2009 Document Revised: 09/04/2015 Document Reviewed: 05/03/2014 Elsevier Interactive Patient Education  2017 [employer].

## 2020-06-11 ENCOUNTER — Encounter: Payer: Self-pay | Admitting: Family Medicine

## 2020-07-14 ENCOUNTER — Emergency Department (HOSPITAL_COMMUNITY): Payer: Medicare Other

## 2020-07-14 ENCOUNTER — Other Ambulatory Visit: Payer: Self-pay

## 2020-07-14 ENCOUNTER — Emergency Department (HOSPITAL_COMMUNITY)
Admission: EM | Admit: 2020-07-14 | Discharge: 2020-07-15 | Disposition: A | Discharge status: Home / Self Care | Payer: Medicare Other | Attending: Emergency Medicine | Admitting: Emergency Medicine

## 2020-07-14 ENCOUNTER — Telehealth: Payer: Self-pay | Admitting: Cardiology

## 2020-07-14 DIAGNOSIS — I10 Essential (primary) hypertension: Secondary | ICD-10-CM | POA: Diagnosis not present

## 2020-07-14 DIAGNOSIS — J40 Bronchitis, not specified as acute or chronic: Secondary | ICD-10-CM | POA: Diagnosis not present

## 2020-07-14 DIAGNOSIS — Z79899 Other long term (current) drug therapy: Secondary | ICD-10-CM | POA: Diagnosis not present

## 2020-07-14 DIAGNOSIS — R002 Palpitations: Secondary | ICD-10-CM | POA: Diagnosis present

## 2020-07-14 DIAGNOSIS — Z955 Presence of coronary angioplasty implant and graft: Secondary | ICD-10-CM | POA: Diagnosis not present

## 2020-07-14 DIAGNOSIS — I25110 Atherosclerotic heart disease of native coronary artery with unstable angina pectoris: Secondary | ICD-10-CM | POA: Insufficient documentation

## 2020-07-14 DIAGNOSIS — Z87891 Personal history of nicotine dependence: Secondary | ICD-10-CM | POA: Insufficient documentation

## 2020-07-14 LAB — BASIC METABOLIC PANEL
Anion gap: 5 (ref 5–15)
BUN: 18 mg/dL (ref 8–23)
CO2: 27 mmol/L (ref 22–32)
Calcium: 9.3 mg/dL (ref 8.9–10.3)
Chloride: 102 mmol/L (ref 98–111)
Creatinine, Ser: 0.72 mg/dL (ref 0.44–1.00)
GFR, Estimated: >60 mL/min (ref >60)
Glucose, Bld: 98 mg/dL (ref 70–99)
Potassium: 4 mmol/L (ref 3.5–5.1)
Sodium: 134 mmol/L — ABNORMAL LOW (ref 135–145)

## 2020-07-14 LAB — CBC
HCT: 46 % (ref 36.0–46.0)
Hemoglobin: 14.6 g/dL (ref 12.0–15.0)
MCH: 26.4 pg (ref 26.0–34.0)
MCHC: 31.7 g/dL (ref 30.0–36.0)
MCV: 83.2 fL (ref 80.0–100.0)
Platelets: 219 10*3/uL (ref 150–400)
RBC: 5.53 MIL/uL — ABNORMAL HIGH (ref 3.87–5.11)
RDW: 14.5 % (ref 11.5–15.5)
WBC: 8.1 10*3/uL (ref 4.0–10.5)
nRBC: 0 % (ref 0.0–0.2)

## 2020-07-14 LAB — TROPONIN I (HIGH SENSITIVITY)
Troponin I (High Sensitivity): 4 ng/L (ref <18)
Troponin I (High Sensitivity): 5 ng/L (ref <18)

## 2020-07-14 MED ORDER — DOXYCYCLINE HYCLATE 100 MG PO TABS
100.0000 mg | ORAL_TABLET | Freq: Once | ORAL | Status: AC
  Administered 2020-07-15: 100 mg via ORAL
  Filled 2020-07-14: qty 1

## 2020-07-14 MED ORDER — DOXYCYCLINE HYCLATE 100 MG PO CAPS: 100.0000 mg | ORAL_CAPSULE | Freq: Two times a day (BID) | ORAL | 0 refills | Status: DC

## 2020-07-14 NOTE — ED Triage Notes (Signed)
Pt reports fluttering in her chest x 2 weeks, following a respiratory virus. Pt is normally normotensive, but today bp was 021 systolic. Continues to cough up green phlegm.

## 2020-07-14 NOTE — Telephone Encounter (Signed)
Spoke with pt regarding fluttery feeling in her chest. Pt states that she recently travelled to [place] where she believes she got the flu. Pt states that she was tested for covid and it was negative. Pt states that her blood pressure is normally 120/80, but has recently been running around 168/88, with HR- 64 and SpO2- 97%. Pt states that she has had a lot of chest congestion and coughing up phlegm.  Pt isn't sure if this cardiac or pulmonary related. Pt would like for Dr. Stanford Breed to advise. Pt would like appointment with Dr. Stanford Breed asap. Pt will also follow up with PCP to see if they have any advise or availability to see pt.

## 2020-07-14 NOTE — ED Triage Notes (Signed)
Emergency Medicine Provider Triage Evaluation Note  Anne Martin , a 75 y.o. female  was evaluated in triage.  Pt complains of a fluttering feeling in her chest for 2 weeks.  She is concerned she is in A. fib.  She states that she had a respiratory virus and has been coughing up green phlegm.  Today her blood pressure was 128 systolic. She denies any history of A. fib, no caffeine use or stimulant use.Marland Kitchen   Physical Exam  BP (!) 155/67 (BP Location: Left Arm)   Pulse 67   Temp 98.1 F (36.7 C) (Oral)   Resp 18   LMP  (LMP Unknown)   SpO2 100%   Patient is awake and alert, generally well-appearing.  She is able to answer questions without difficulty.    Medical Decision Making  Medically screening exam initiated at 6:31 PM.  Appropriate orders placed.  RYNLEE LISBON was informed that the remainder of the evaluation will be completed by another provider, this initial triage assessment does not replace that evaluation, and the importance of remaining in the ED until their evaluation is complete.  Clinical Impression  Palpitations   Lorin Glass, [place] 07/14/20 1832

## 2020-07-14 NOTE — Telephone Encounter (Signed)
Patient c/o Palpitations:  High priority if patient c/o lightheadedness, shortness of breath, or chest pain  1) How long have you had palpitations/irregular HR/ Afib? Are you having the symptoms now?  Patient states she has been having flutters/palpitations for the past 3 days. No symptoms currently.   2) Are you currently experiencing lightheadedness, SOB or CP? No   3) Do you have a history of afib (atrial fibrillation) or irregular heart rhythm? No   4) Have you checked your BP or HR? (document readings if available): No, but she states she will take her BP and have it available for when she receives a call back.  5) Are you experiencing any other symptoms?   Chest congestion, coughing up phlegm.  Patient states she recently had the flu and she is having mild fluttering with the chest congestion. She is unsure whether it is respiratory or heart related, but she wanted to make Dr. Stanford Breed aware of her symptoms.

## 2020-07-15 DIAGNOSIS — I10 Essential (primary) hypertension: Secondary | ICD-10-CM | POA: Diagnosis not present

## 2020-07-15 NOTE — Telephone Encounter (Signed)
Would continue to follow BP; can schedule APP ov Kirk Ruths

## 2020-07-15 NOTE — ED Notes (Signed)
Medications follow up appts reviewed w/ pt. Evaluated for heart fluttering has not had episode since arrival. Dx w/ bronchitis tx w/ abx during stay medication sent to Denies questions or concerns @ this time. Education on s/s of worsening and when to return.. Left w/ even and steady gait. NAD noted.VSS.

## 2020-07-15 NOTE — ED Provider Notes (Signed)
[facility] EMERGENCY DEPARTMENT Provider Note   CSN: [id number] Arrival date & time: 07/14/20  1809     History Chief Complaint  Patient presents with  . Palpitations  . Hypertension    Anne Martin is a 75 y.o. female.  The history is provided by the patient.  Palpitations Palpitations quality:  Fast Onset quality:  Gradual Timing:  Constant Progression:  Unchanged Chronicity:  New Relieved by:  Nothing Worsened by:  Nothing Associated symptoms: cough   Associated symptoms: no chest pain and no syncope   Associated symptoms comment:  Denies hemoptysis Risk factors: heart disease   Risk factors: no hx of atrial fibrillation   Hypertension Pertinent negatives include no chest pain.  Patient with history of CAD, aortic stenosis presents with multiple complaints.  Patient reports he has had intermittent cough since January.  She reports multiple negative Covid tests, so she suspected it was the flu.  She reports at that time she had a cough with greenish sputum.  No hemoptysis.  She also reports recent palpitations for the past 2 weeks.  No syncope.  No active chest pain.  Earlier in the day she noted that her face was flushed and she had elevated blood pressure.  No other acute complaints     Past Medical History:  Diagnosis Date  . Allergy    seasonal  . Anemia    prior to hysterectomy, fibroids  . Aortic insufficiency    a. 10/2015 Echo: mild to moderate AI.  Marland Kitchen Aortic stenosis    a. 10/2015 Echo: moderate AS.  Marland Kitchen Blood transfusion without reported diagnosis    with hysterectomy  . CAD (coronary artery disease)    a. 08/2009 s/p cath after abnormal nuclear study; LM nl, LAD 60%, D2 90% (PCI/DES), LCX 50 -60%, RCA nl, nl EF;  b. 2012 MV: no ischemia/infarct;  c. 08/2015 MV: no ischemia/infarct, EF 70%.  . Complication of anesthesia   . Diastolic dysfunction    a. 10/2015 Echo: EF 55-60%, no rwma, Gr1 DD, mod AS, mild to mod AI.  Marland Kitchen Essential hypertension    . H/O: rheumatic fever   . History of colonoscopy   . Hyperlipidemia   . Osteopenia    last DEXA 11-22-13   . PONV (postoperative nausea and vomiting)     Patient Active Problem List   Diagnosis Date Noted  . Lumbar herniated disc 01/03/2020  . Lumbar disc herniation 01/03/2020  . Unstable angina ([place]) 03/28/2018  . Chest pain 10/28/2016  . Aortic insufficiency 02/29/2016  . Essential hypertension 02/29/2016  . Musculoskeletal chest pain 02/28/2016  . Aortic stenosis 02/24/2012  . Hyperlipidemia 11/07/2009  . CAD (coronary artery disease) 09/15/2009  . CAROTID BRUIT 09/15/2009  . CONTACT DERMATITIS 01/28/2009  . Disorder of bone and cartilage 12/17/2008  . ALLERGIC RHINITIS 08/16/2007  . KNEE PAIN 08/16/2007  . History of cardiovascular disorder 08/16/2007    Past Surgical History:  Procedure Laterality Date  . ANGIOPLASTY  may 2011   stents  . AORTIC ARCH ANGIOGRAPHY N/A 03/29/2018   Procedure: AORTIC ARCH ANGIOGRAPHY;  Surgeon: Nelva Bush, MD;  Location: [facility];  Service: Cardiovascular;  Laterality: N/A;  . CHOLECYSTECTOMY  nov 2004  . COLONOSCOPY  02/02/2016   per Dr. Henrene Pastor, adenomatous polyps, repeat in 5 yrs   . INTRAVASCULAR PRESSURE WIRE/FFR STUDY N/A 03/29/2018   Procedure: INTRAVASCULAR PRESSURE WIRE/FFR STUDY;  Surgeon: Nelva Bush, MD;  Location: [facility];  Service: Cardiovascular;  Laterality: N/A;  . KNEE ARTHROSCOPY     Rt. Knee  . LEFT HEART CATH AND CORONARY ANGIOGRAPHY N/A 03/29/2018   Procedure: LEFT HEART CATH AND CORONARY ANGIOGRAPHY;  Surgeon: Nelva Bush, MD;  Location: [facility];  Service: Cardiovascular;  Laterality: N/A;  . LUMBAR LAMINECTOMY/DECOMPRESSION MICRODISCECTOMY Left 01/03/2020   Procedure: Left L2-3 decompression disectomy, Insitu Fusion Lumbar 2-3;  Surgeon: Melina Schools, MD;  Location: [facility];  Service: Orthopedics;  Laterality: Left;  . TONSILLECTOMY    . TONSILLECTOMY    . VAGINAL  HYSTERECTOMY  1994     OB History   No obstetric history on file.     Family History  Problem Relation Age of Onset  . Heart disease Mother   . Heart disease Father        CABG  . Heart disease Brother        2 stents  . Ulcerative colitis Daughter   . Colon cancer Neg Hx     Social History   Tobacco Use  . Smoking status: Former Smoker    Types: Cigarettes    Quit date: 04/12/1977    Years since quitting: 43.2  . Smokeless tobacco: Never Used  . Tobacco comment: quit age 25  Vaping Use  . Vaping Use: Never used  Substance Use Topics  . Alcohol use: Yes    Alcohol/week: 0.0 standard drinks    Comment: rarely  . Drug use: No    Home Medications Prior to Admission medications   Medication Sig Start Date End Date Taking? Authorizing Provider  doxycycline (VIBRAMYCIN) 100 MG capsule Take 1 capsule (100 mg total) by mouth 2 (two) times daily. One po bid x 7 days 07/14/20  Yes Ripley Fraise, MD  atorvastatin (LIPITOR) 80 MG tablet Take 1 tablet (80 mg total) by mouth daily at 6 PM. 12/10/19   Lelon Perla, MD  azithromycin [facility]) 250 MG tablet Use as directed 04/18/20   Laurey Morale, MD  calcium carbonate (OSCAL) 1500 (600 Ca) MG TABS tablet Take 600 mg of elemental calcium by mouth daily.    [provider]  cholecalciferol (VITAMIN D) 1000 UNITS tablet Take 1,000 Units by mouth every evening.     [provider]  isosorbide mononitrate (IMDUR) 30 MG 24 hr tablet Take 1 tablet (30 mg total) by mouth daily. 04/18/20   Lelon Perla, MD  loratadine (CLARITIN) 10 MG tablet Take 10 mg by mouth daily.     [provider]  metoprolol succinate (TOPROL-XL) 25 MG 24 hr tablet TAKE 1 TABLET BY MOUTH EVERY DAY Patient taking differently: Take 25 mg by mouth daily. 12/18/19   Lelon Perla, MD  nitroGLYCERIN (NITROSTAT) 0.4 MG SL tablet Place 1 tablet (0.4 mg total) under the tongue every 5 (five) minutes as needed. MAX 3 doses Patient not  taking: Reported on 05/22/2020 03/28/18   Barrett, Evelene Croon, PA-C  Probiotic Product (PROBIOTIC PO) Take 1 tablet by mouth daily.     [provider]  vitamin C (ASCORBIC ACID) 500 MG tablet Take 500 mg by mouth at bedtime.    [provider]  YUVAFEM 10 MCG TABS vaginal tablet Place 10 mcg vaginally 2 (two) times a week.  10/29/17   [provider]    Allergies    Procaine hcl  Review of Systems   Review of Systems  Constitutional: Negative for fever.  Respiratory: Positive for cough.   Cardiovascular: Positive for palpitations. Negative for  chest pain, leg swelling and syncope.  Neurological: Negative for syncope.  All other systems reviewed and are negative.   Physical Exam Updated Vital Signs BP (!) 155/72   Pulse 66   Temp (!) 97.4 F (36.3 C) (Oral)   Resp 17   LMP  (LMP Unknown)   SpO2 99%   Physical Exam  CONSTITUTIONAL: Elderly, appears younger than stated age HEAD: Normocephalic/atraumatic EYES: EOMI/PERRL ENMT: Mucous membranes moist NECK: supple no meningeal signs SPINE/BACK:entire spine nontender CV: D9/I3 noted, systolic ejection murmur noted LUNGS: Lungs are clear to auscultation bilaterally, no apparent distress ABDOMEN: soft, nontender, no rebound or guarding, bowel sounds noted throughout abdomen GU:no cva tenderness NEURO: Pt is awake/alert/appropriate, moves all extremitiesx4.  No facial droop.   EXTREMITIES: pulses normal/equalx4, full ROM, no lower extremity edema or tenderness SKIN: warm, color normal PSYCH: no abnormalities of mood noted, alert and oriented to situation  ED Results / Procedures / Treatments   Labs (all labs ordered are listed, but only abnormal results are displayed) Labs Reviewed  BASIC METABOLIC PANEL - Abnormal; Notable for the following components:      Result Value   Sodium 134 (*)    All other components within normal limits  CBC - Abnormal; Notable for the following components:   RBC 5.53 (*)     All other components within normal limits  TROPONIN I (HIGH SENSITIVITY)  TROPONIN I (HIGH SENSITIVITY)    EKG EKG Interpretation  Date/Time:  Monday July 14 2020 18:29:32 EDT Ventricular Rate:  66 PR Interval:  146 QRS Duration: 82 QT Interval:  382 QTC Calculation: 400 R Axis:   82 Text Interpretation: Normal sinus rhythm Normal ECG Confirmed by Ripley Fraise [phone]) on 07/14/2020 11:12:55 PM   Radiology DG Chest 2 View  Result Date: 07/14/2020 CLINICAL DATA:  75 year old female with history of chest pain, productive cough and congestion for 1 month. EXAM: CHEST - 2 VIEW COMPARISON:  Chest x-ray 01/01/2020. FINDINGS: Lung volumes are normal. Diffuse peribronchial cuffing. No consolidative airspace disease. No pleural effusions. No pneumothorax. No pulmonary nodule or mass noted. Pulmonary vasculature and the cardiomediastinal silhouette are within normal limits. IMPRESSION: 1. Diffuse peribronchial cuffing concerning for an acute bronchitis. Electronically Signed   By: Vinnie Langton M.D.   On: 07/14/2020 19:02    Procedures Procedures   Medications Ordered in ED Medications  doxycycline (VIBRA-TABS) tablet 100 mg (100 mg Oral Given 07/15/20 0000)    ED Course  I have reviewed the triage vital signs and the nursing notes.  Pertinent labs & imaging results that were available during my care of the patient were reviewed by me and considered in my medical decision making (see chart for details).    MDM Rules/Calculators/A&P                          Patient presents for multiple complaints.  Patient reports cough since January.  Reports she is now having greenish sputum.  Patient agrees to start restart an antibiotic due to persistent cough.  Low suspicion for COVID-19 this time.  EKG reveals sinus rhythm, no signs of A. fib.  Advised to follow-up with her cardiologist for potential Holter monitoring and may need echocardiogram.  Patient denies any recent fatigue or  significant dyspnea on exertion.  She will also need follow-up for her high blood pressure.  At this time, patient is well-appearing and appropriate for discharge Final Clinical Impression(s) / ED Diagnoses Final diagnoses:  Primary hypertension  Palpitations  Bronchitis    Rx / DC Orders ED Discharge Orders         Ordered    doxycycline (VIBRAMYCIN) 100 MG capsule  2 times daily        07/14/20 2358           Ripley Fraise, MD 07/15/20 0013

## 2020-07-15 NOTE — Progress Notes (Signed)
Virtual Visit via Telephone Note   This visit type was conducted due to national recommendations for restrictions regarding the COVID-19 Pandemic (e.g. social distancing) in an effort to limit this patient's exposure and mitigate transmission in our community.  Due to her co-morbid illnesses, this patient is at least at moderate risk for complications without adequate follow up.  This format is felt to be most appropriate for this patient at this time.  The patient did not have access to video technology/had technical difficulties with video requiring transitioning to audio format only (telephone).  All issues noted in this document were discussed and addressed.  No physical exam could be performed with this format.  Please refer to the patient's chart for her  consent to telehealth for [facility].  Evaluation Performed:  Follow-up visit  This visit type was conducted due to national recommendations for restrictions regarding the COVID-19 Pandemic (e.g. social distancing).  This format is felt to be most appropriate for this patient at this time.  All issues noted in this document were discussed and addressed.  No physical exam was performed (except for noted visual exam findings with Video Visits).  Please refer to the patient's chart (MyChart message for video visits and phone note for telephone visits) for the patient's consent to telehealth for [place]  Date:  07/16/2020   ID:  Anne Martin, DOB 08/24/1945, MRN [id number]  Patient Location:  [place] [address]   Provider location:     [address] Office [phone] Fax [phone]   PCP:  Laurey Morale, MD  Cardiologist:  Kirk Ruths, MD   Electrophysiologist:  None   Chief Complaint: Follow-up for palpitations and hypertension  History of Present Illness:    Anne Martin is a 75 y.o. female who presents via audio/video  conferencing for a telehealth visit today.  Patient verified DOB and address.  She has a PMH of coronary artery disease, aortic valve stenosis, essential hypertension, and hypercholesterolemia.  She underwent cardiac catheterization 5/11.  That showed normal left main, 60% LAD, 90% D2, 50-60% circumflex, normal RCA and normal LV function.  She had PCI with DES to her diagonal.  Carotid Dopplers 10/13 showed normal findings.  Cardiac catheterization 12/19 showed nonobstructive coronary artery disease with moderate mid LAD and moderate in-stent restenosis in her second diagonal.  LV function was normal and filling pressures were also normal at that time.  She was noted to have a 70 aortic dilation that was mild.  Echocardiogram 8/20 showed normal LV function, mild aortic insufficiency, mild aortic stenosis with a mean gradient of 12 mmHg.  CTA 8/21 showed no evidence of thoracic aortic aneurysm.  Her scattered pulmonary nodules were unchanged at that time and now follow-up was recommended.  She was last seen by Dr. Stanford Breed on 12/24/2019.  During that time she denied dyspnea, palpitations, syncope, and chest pain.  She presented to the emergency department 07/14/20 and was discharged on 07/15/2020.  She has a PMH of coronary artery disease, and aortic stenosis.  She reported intermittent cough since January.  She also reported several negative Covid tests.  She felt her cough was related to flu.  She reported that a cough with greenish sputum.  She denied hemoptysis.  She also reported recent palpitations for the past 2 weeks.  She denied syncope and chest pain.  She also reported that her face symptoms flushed and she  had high blood pressure.  She had no acute complaints at the time of her evaluation.  Her EKG showed normal sinus rhythm 66 bpm.  Blood pressure 155/72.  Troponins were 4 and 5.  CXR showed diffuse peribronchial cuffing concerning for acute bronchitis.  She was prescribed doxycycline and asked to  follow-up with cardiology.  She is seen virtually today in follow-up and states she has been worried due to her increased blood pressure and her flutter/palpitation type feeling.  She reports that over the fall she was healing from back surgery and was sedentary.  She has just returned from [place] where she was seen and evaluated by physician there.  She reports she tested negative for Covid.  She reports she is worried that she may be developing blockages like she did with her previous cardiac catheterization.  She reports compliance with her new doxycycline therapy and reports that her blood pressure this morning after walking her dog was 129/86.  She reports that she has intermittent palpitations that last for seconds to a minute and then dissipate with rest.  She reports that these have been present for around a month.  I have asked her to monitor her blood pressure, increase her physical activity as tolerated, I will give her a cardiac event monitor, a secondary causes of hypertension, and have her follow-up in 6 to 8 weeks.  Today she denies chest pain, increased shortness of breath, lower extremity edema, fatigue, melena, hematuria, hemoptysis, diaphoresis, weakness, presyncope, syncope, orthopnea, and PND.   The patient does not symptoms concerning for COVID-19 infection (fever, chills, cough, or new SHORTNESS OF BREATH).    Prior CV studies:   The following studies were reviewed today:  Echocardiogram 12/31/2019 IMPRESSIONS    1. Left ventricular ejection fraction, by estimation, is 60 to 65%. The  left ventricle has normal function. The left ventricle has no regional  wall motion abnormalities. Left ventricular diastolic parameters are  indeterminate.  2. Right ventricular systolic function is normal. The right ventricular  size is normal. There is normal pulmonary artery systolic pressure.  3. Right atrial size was mildly dilated.  4. The mitral valve is normal in structure.  Trivial mitral valve  regurgitation. No evidence of mitral stenosis.  5. The aortic valve is tricuspid. Aortic valve regurgitation is mild.  Mild aortic valve stenosis.  6. The inferior vena cava is normal in size with greater than 50%  respiratory variability, suggesting right atrial pressure of 3 mmHg.   Comparison(s): No significant change from prior study.   Past Medical History:  Diagnosis Date  . Allergy    seasonal  . Anemia    prior to hysterectomy, fibroids  . Aortic insufficiency    a. 10/2015 Echo: mild to moderate AI.  Marland Kitchen Aortic stenosis    a. 10/2015 Echo: moderate AS.  Marland Kitchen Blood transfusion without reported diagnosis    with hysterectomy  . CAD (coronary artery disease)    a. 08/2009 s/p cath after abnormal nuclear study; LM nl, LAD 60%, D2 90% (PCI/DES), LCX 50 -60%, RCA nl, nl EF;  b. 2012 MV: no ischemia/infarct;  c. 08/2015 MV: no ischemia/infarct, EF 70%.  . Complication of anesthesia   . Diastolic dysfunction    a. 10/2015 Echo: EF 55-60%, no rwma, Gr1 DD, mod AS, mild to mod AI.  Marland Kitchen Essential hypertension   . H/O: rheumatic fever   . History of colonoscopy   . Hyperlipidemia   . Osteopenia    last DEXA 11-22-13   .  PONV (postoperative nausea and vomiting)    Past Surgical History:  Procedure Laterality Date  . ANGIOPLASTY  may 2011   stents  . AORTIC ARCH ANGIOGRAPHY N/A 03/29/2018   Procedure: AORTIC ARCH ANGIOGRAPHY;  Surgeon: Nelva Bush, MD;  Location: [place];  Service: Cardiovascular;  Laterality: N/A;  . CHOLECYSTECTOMY  nov 2004  . COLONOSCOPY  02/02/2016   per Dr. Henrene Pastor, adenomatous polyps, repeat in 5 yrs   . INTRAVASCULAR PRESSURE WIRE/FFR STUDY N/A 03/29/2018   Procedure: INTRAVASCULAR PRESSURE WIRE/FFR STUDY;  Surgeon: Nelva Bush, MD;  Location: [place] [facility];  Service: Cardiovascular;  Laterality: N/A;  . KNEE ARTHROSCOPY     Rt. Knee  . LEFT HEART CATH AND CORONARY ANGIOGRAPHY N/A 03/29/2018   Procedure: LEFT HEART  CATH AND CORONARY ANGIOGRAPHY;  Surgeon: Nelva Bush, MD;  Location: [facility] [facility];  Service: Cardiovascular;  Laterality: N/A;  . LUMBAR LAMINECTOMY/DECOMPRESSION MICRODISCECTOMY Left 01/03/2020   Procedure: Left L2-3 decompression disectomy, Insitu Fusion Lumbar 2-3;  Surgeon: Melina Schools, MD;  Location: [facility];  Service: Orthopedics;  Laterality: Left;  . TONSILLECTOMY    . TONSILLECTOMY    . VAGINAL HYSTERECTOMY  1994     Current Meds  Medication Sig  . atorvastatin (LIPITOR) 80 MG tablet Take 1 tablet (80 mg total) by mouth daily at 6 PM.  . calcium carbonate (OSCAL) 1500 (600 Ca) MG TABS tablet Take 600 mg of elemental calcium by mouth daily.  . cholecalciferol (VITAMIN D) 1000 UNITS tablet Take 1,000 Units by mouth every evening.   Marland Kitchen doxycycline (VIBRAMYCIN) 100 MG capsule Take 1 capsule (100 mg total) by mouth 2 (two) times daily. One po bid x 7 days  . isosorbide mononitrate (IMDUR) 30 MG 24 hr tablet Take 1 tablet (30 mg total) by mouth daily.  Marland Kitchen loratadine (CLARITIN) 10 MG tablet Take 10 mg by mouth daily.   . metoprolol succinate (TOPROL-XL) 25 MG 24 hr tablet TAKE 1 TABLET BY MOUTH EVERY DAY (Patient taking differently: Take 25 mg by mouth daily.)  . nitroGLYCERIN (NITROSTAT) 0.4 MG SL tablet Place 1 tablet (0.4 mg total) under the tongue every 5 (five) minutes as needed. MAX 3 doses  . Probiotic Product (PROBIOTIC PO) Take 1 tablet by mouth daily.   . vitamin C (ASCORBIC ACID) 500 MG tablet Take 500 mg by mouth at bedtime.  Merril Abbe 10 MCG TABS vaginal tablet Place 10 mcg vaginally 2 (two) times a week.      Allergies:   Procaine hcl   Social History   Tobacco Use  . Smoking status: Former Smoker    Types: Cigarettes    Quit date: 04/12/1977    Years since quitting: 43.2  . Smokeless tobacco: Never Used  . Tobacco comment: quit age 49  Vaping Use  . Vaping Use: Never used  Substance Use Topics  . Alcohol use: Yes    Alcohol/week: 0.0 standard drinks     Comment: rarely  . Drug use: No     Family Hx: The patient's family history includes Heart disease in her brother, father, and mother; Ulcerative colitis in her daughter. There is no history of Colon cancer.  ROS:   Please see the history of present illness.     All other systems reviewed and are negative.   Labs/Other Tests and Data Reviewed:    Recent Labs: 02/14/2020: ALT 27; TSH 3.38 07/14/2020: BUN 18; Creatinine, Ser 0.72; Hemoglobin 14.6; Platelets 219; Potassium 4.0; Sodium 134  Recent Lipid Panel Lab Results  Component Value Date/Time   CHOL 135 02/14/2020 10:08 AM   CHOL 142 12/24/2019 09:40 AM   TRIG 98 02/14/2020 10:08 AM   HDL 71 02/14/2020 10:08 AM   HDL 89 12/24/2019 09:40 AM   CHOLHDL 1.9 02/14/2020 10:08 AM   LDLCALC 46 02/14/2020 10:08 AM    Wt Readings from Last 3 Encounters:  07/16/20 170 lb (77.1 kg)  02/14/20 160 lb (72.6 kg)  01/03/20 158 lb (71.7 kg)     Exam:    Vital Signs:  BP 129/86   Pulse 68   Wt 170 lb (77.1 kg)   LMP  (LMP Unknown)   BMI 29.18 kg/m    Well nourished, well developed female in no  acute distress.   ASSESSMENT & PLAN:    1.  Essential hypertension-BP today 129/86.  Recently seen and evaluated in the emergency department.  She complained of cough since January chest x-ray showed acute bronchitis.  Blood pressure at that time was 155/72. Continue metoprolol, Imdur Heart healthy low-sodium diet-salty 6 given Increase physical activity as tolerated Blood pressure log  Palpitations-no palpitations today.  Reports intermittent episodes of accelerated heart rate on the last 2 weeks.  Denies any dizziness, presyncope or syncope. Order 7-day ZIO monitor  Coronary artery disease-no chest pain today.  Denies recent episodes of chest discomfort, back neck or arm discomfort. Continue atorvastatin, Imdur, metoprolol, nitroglycerin Heart healthy low-sodium diet-salty 6 given Increase physical activity as  tolerated  Hyperlipidemia-02/14/2020: Cholesterol 135; HDL 71; LDL Cholesterol (Calc) 46; Triglycerides 98 Continue atorvastatin Heart healthy low-sodium high-fiber diet Increase physical activity as tolerated  Aortic stenosis/aortic insufficiency-no increased DOE or activity tolerance.  Echocardiogram 9/21 showed normal LVEF, mild AS and AI. Repeat echocardiogram 9/22   Patient Risk:   After full review of this patients clinical status, I feel that they are at least moderate risk at this time.  Time:   Today, I have spent 12 minutes with the patient with telehealth technology discussing diet exercise, medication, previous cardiac tests, and palpitations.  I spent greater than 20 minutes reviewing her past medical history, medications, and prior cardiac test.   Medication Adjustments/Labs and Tests Ordered: Current medicines are reviewed at length with the patient today.  Concerns regarding medicines are outlined above.   Tests Ordered: No orders of the defined types were placed in this encounter.  Medication Changes: No orders of the defined types were placed in this encounter.   Disposition:  in 6 week(s)  Signed, Jossie Ng. Phylliss Strege NP-C    11/14/2018 11:58 AM    [address] Office [phone] Fax [phone]

## 2020-07-16 ENCOUNTER — Encounter: Payer: Self-pay | Admitting: *Deleted

## 2020-07-16 ENCOUNTER — Telehealth (INDEPENDENT_AMBULATORY_CARE_PROVIDER_SITE_OTHER): Payer: Medicare Other | Admitting: General Practice

## 2020-07-16 ENCOUNTER — Ambulatory Visit (INDEPENDENT_AMBULATORY_CARE_PROVIDER_SITE_OTHER): Payer: Medicare Other

## 2020-07-16 ENCOUNTER — Encounter: Payer: Self-pay | Admitting: General Practice

## 2020-07-16 VITALS — BP 129/86 | HR 68 | Wt 170.0 lb

## 2020-07-16 DIAGNOSIS — I10 Essential (primary) hypertension: Secondary | ICD-10-CM

## 2020-07-16 DIAGNOSIS — E7800 Pure hypercholesterolemia, unspecified: Secondary | ICD-10-CM | POA: Diagnosis not present

## 2020-07-16 DIAGNOSIS — I2510 Atherosclerotic heart disease of native coronary artery without angina pectoris: Secondary | ICD-10-CM | POA: Diagnosis not present

## 2020-07-16 DIAGNOSIS — R002 Palpitations: Secondary | ICD-10-CM | POA: Diagnosis not present

## 2020-07-16 DIAGNOSIS — I350 Nonrheumatic aortic (valve) stenosis: Secondary | ICD-10-CM

## 2020-07-16 NOTE — Telephone Encounter (Signed)
Office visit 07/16/20

## 2020-07-16 NOTE — Patient Instructions (Signed)
Medication Instructions:  The current medical regimen is effective;  continue present plan and medications as directed. Please refer to the Current Medication list given to you today.  *If you need a refill on your cardiac medications before your next appointment, please call your pharmacy*  Lab Work: NONE  Testing/Procedures: Your physician has requested you wear your ZIO patch monitor_7__days. SEE INSTRUCTIONS BELOW  Causes of Hypertension(Please avoid) Steroids, stimulants, antidepressants, weight loss medications, immune suppressants, NSAIDs, sympathomimetics, alcohol, caffeine, licorice, ginseng, St. John's wort  Special Instructions TAKE AND LOG TOUR BLOOD PRESSURE AT LEAST 1 HOUR AFTER TAKING YOU MEDICATION  PLEASE READ AND FOLLOW SALTY 6-ATTACHED-1,800 mg daily  PLEASE INCREASE PHYSICAL ACTIVITY AS TOLERATED  Follow-Up: Your next appointment:  6 week(s) In Person with Kirk Ruths, MD OR IF UNAVAILABLE [place], FNP-C  At [facility], you and your health needs are our priority.  As part of our continuing mission to provide you with exceptional heart care, we have created designated Provider Care Teams.  These Care Teams include your primary Cardiologist (physician) and Advanced Practice Providers (APPs -  Physician Assistants and Nurse Practitioners) who all work together to provide you with the care you need, when you need it.      ZIO XT- Long Term Monitor Instructions   This is a single patch monitor.  [employer] supplies one patch monitor per enrollment.  Additional stickers are not available.   Please do not apply patch if you will be having a Nuclear Stress Test, Echocardiogram, Cardiac CT, MRI, or Chest Xray during the time frame you would be wearing the monitor. The patch cannot be worn during these tests.  You cannot remove and re-apply the ZIO XT patch monitor.   Your ZIO patch monitor will be sent USPS Priority mail from [facility] directly to  your home address. The monitor may also be mailed to a PO BOX if home delivery is not available.   It may take 3-5 days to receive your monitor after you have been enrolled.   Once you have received you monitor, please review enclosed instructions.  Your monitor has already been registered assigning a specific monitor serial # to you.   Applying the monitor   Shave hair from upper left chest.   Hold abrader disc by orange tab.  Rub abrader in 40 strokes over left upper chest as indicated in your monitor instructions.   Clean area with 4 enclosed alcohol pads .  Use all pads to assure are is cleaned thoroughly.  Let dry.   Apply patch as indicated in monitor instructions.  Patch will be place under collarbone on left side of chest with arrow pointing upward.   Rub patch adhesive wings for 2 minutes.Remove white label marked "1".  Remove white label marked "2".  Rub patch adhesive wings for 2 additional minutes.   While looking in a mirror, press and release button in center of patch.  A small green light will flash 3-4 times .  This will be your only indicator the monitor has been turned on.     Do not shower for the first 24 hours.  You may shower after the first 24 hours.   Press button if you feel a symptom. You will hear a small click.  Record Date, Time and Symptom in the Patient Log Book.   When you are ready to remove patch, follow instructions on last 2 pages of Patient Log Book.  Stick patch monitor onto last page of  Patient Log Book.   Place Patient Log Book in [place] box.  Use locking tab on box and tape box closed securely.  The [employer] and [employer] has [employer] on it.  Please place in mailbox as soon as possible.  Your physician should have your test results approximately 7 days after the monitor has been mailed back to [facility].   Call [place] at [phone] if you have questions regarding your ZIO XT patch monitor.  Call them immediately if  you see an orange light blinking on your monitor.   If your monitor falls off in less than 4 days contact our Monitor department at [phone].  If your monitor becomes loose or falls off after 4 days call [employer] at [phone] for suggestions on securing your monitor.

## 2020-07-16 NOTE — Progress Notes (Signed)
Patient ID: Anne Martin, female   DOB: Oct 14, 1945, 75 y.o.   MRN: [id number] Patient enrolled for [employer] to ship a 7 day ZIO XT to her home.

## 2020-07-19 DIAGNOSIS — R002 Palpitations: Secondary | ICD-10-CM | POA: Diagnosis not present

## 2020-08-01 ENCOUNTER — Other Ambulatory Visit: Payer: Self-pay

## 2020-08-01 ENCOUNTER — Encounter: Payer: Self-pay | Admitting: Family Medicine

## 2020-08-01 ENCOUNTER — Telehealth: Payer: Self-pay

## 2020-08-01 ENCOUNTER — Ambulatory Visit (INDEPENDENT_AMBULATORY_CARE_PROVIDER_SITE_OTHER): Payer: Medicare Other | Admitting: Family Medicine

## 2020-08-01 VITALS — BP 110/60 | HR 67 | Temp 97.6°F | Wt 174.6 lb

## 2020-08-01 DIAGNOSIS — J40 Bronchitis, not specified as acute or chronic: Secondary | ICD-10-CM | POA: Diagnosis not present

## 2020-08-01 DIAGNOSIS — I2510 Atherosclerotic heart disease of native coronary artery without angina pectoris: Secondary | ICD-10-CM | POA: Diagnosis not present

## 2020-08-01 MED ORDER — METOPROLOL SUCCINATE ER 25 MG PO TB24: 37.5000 mg | ORAL_TABLET | Freq: Every day | ORAL | 3 refills | Status: DC

## 2020-08-01 MED ORDER — ALBUTEROL SULFATE HFA 108 (90 BASE) MCG/ACT IN AERS: 2.0000 | INHALATION_SPRAY | RESPIRATORY_TRACT | 0 refills | Status: DC | PRN

## 2020-08-01 MED ORDER — METHYLPREDNISOLONE ACETATE 40 MG/ML IJ SUSP
40.0000 mg | Freq: Once | INTRAMUSCULAR | Status: AC
  Administered 2020-08-01: 40 mg via INTRAMUSCULAR

## 2020-08-01 MED ORDER — AZITHROMYCIN 250 MG PO TABS
250.0000 mg | ORAL_TABLET | Freq: Every day | ORAL | 0 refills | Status: DC
Start: 2020-08-01 — End: 2020-08-01

## 2020-08-01 MED ORDER — METHYLPREDNISOLONE ACETATE 80 MG/ML IJ SUSP
80.0000 mg | Freq: Once | INTRAMUSCULAR | Status: AC
  Administered 2020-08-01: 80 mg via INTRAMUSCULAR

## 2020-08-01 MED ORDER — AZITHROMYCIN 250 MG PO TABS: 250.0000 mg | ORAL_TABLET | Freq: Every day | ORAL | 0 refills | Status: DC

## 2020-08-01 NOTE — Addendum Note (Signed)
Addended by: Wyvonne Lenz on: 08/01/2020 11:51 AM   Modules accepted: Orders

## 2020-08-01 NOTE — Addendum Note (Signed)
Addended by: Rexanne Mano B on: 08/01/2020 10:13 AM   Modules accepted: Orders

## 2020-08-01 NOTE — Telephone Encounter (Signed)
Pt is returning call.  

## 2020-08-01 NOTE — Telephone Encounter (Addendum)
Left voice message for patient to give office a call back for results and update on medication.  ----- Message from Deberah Pelton, NP sent at 08/01/2020  8:54 AM EDT ----- Please contact Anne Martin and let her know that her cardiac event monitor results been reviewed.  They showed predominantly normal sinus rhythm.  Sinus bradycardia, occasional extra beats from the top of her heart, short runs of fast heart rate from the top chambers of her heart, and occasional extra beats from the bottom portion of her heart.  We will increase her metoprolol to 37.5 mg daily.  Thank you.

## 2020-08-01 NOTE — Progress Notes (Signed)
   Subjective:    Patient ID: Anne Martin, female    DOB: Feb 19, 1946, 75 y.o.   MRN: [id number]  HPI Here to follow up on a visit to the ED on 07-14-20 for a dry cough and SOB. This started about a month ago. No chest pain or fever. No body aches or NVD. She has tested negative for the Covid virus 3 times. At the ED her labs were normal and a CXR showed peribronchial cuffing consistent with a bronchitis. She was treated with 10 days of Doxycycline, and she felt much better for awhile. Now however in the past 4 days the coughing and SOB have returned.    Review of Systems  Constitutional: Negative.   HENT: Negative.   Eyes: Negative.   Respiratory: Positive for cough and shortness of breath. Negative for wheezing.   Cardiovascular: Negative.   Gastrointestinal: Negative.   Genitourinary: Negative.        Objective:   Physical Exam Constitutional:      Appearance: She is not ill-appearing.  Cardiovascular:     Rate and Rhythm: Normal rate and regular rhythm.     Pulses: Normal pulses.     Comments: Has her typical 2/6 SM  Pulmonary:     Effort: Pulmonary effort is normal. No respiratory distress.     Breath sounds: Normal breath sounds. No stridor. No wheezing, rhonchi or rales.  Lymphadenopathy:     Cervical: No cervical adenopathy.  Neurological:     Mental Status: She is alert.           Assessment & Plan:  Bronchitis, probably atypical. We will give her a shot of DepoMedrol today. She will take 14 days of Azithromycin 250 mg daily. Add an albuterol inhaler as needed. Recheck prn.  Alysia Penna, MD

## 2020-08-01 NOTE — Telephone Encounter (Signed)
Made patient aware of her recent heart monitor results below. Sent in new prescription for Metoprolol Succinate 37.5 mg Daily.   Patient states that she is still experiencing a chest soreness that occurs only with coughing, and slight shortness of breath after coughing episode. Patient is not actively having chest pain or symptoms.Patient denies any other symptoms at this time. Patient states that she thinks this is lung related. Patient states that she going to her PCP today to address this.   Made patient aware of ED precautions should new or worsening symptoms develop. Advised patient that I would forward message to Dr. Stanford Breed to make him aware.   Advised patient to call back to office with any issues, questions, or concerns. Patient verbalized understanding.

## 2020-08-01 NOTE — Telephone Encounter (Signed)
Apparently the pharmacy thought it was supposed to be a Zpack, which is only 6 pills. I just sent them another order to clear this up, so make sure you get 14 pills

## 2020-08-12 ENCOUNTER — Other Ambulatory Visit: Payer: Self-pay | Admitting: Cardiology

## 2020-08-12 ENCOUNTER — Encounter: Payer: Self-pay | Admitting: *Deleted

## 2020-08-12 NOTE — Telephone Encounter (Signed)
Rx(s) sent to pharmacy electronically.  

## 2020-08-22 ENCOUNTER — Encounter: Payer: Self-pay | Admitting: Family Medicine

## 2020-08-22 NOTE — Telephone Encounter (Signed)
Pt is scheduled for OV on Monday morning 9.30 am per Dr Sarajane Jews

## 2020-08-22 NOTE — Telephone Encounter (Signed)
Have her come see me OV next week to re-evaluate

## 2020-08-25 ENCOUNTER — Ambulatory Visit (INDEPENDENT_AMBULATORY_CARE_PROVIDER_SITE_OTHER): Payer: Medicare Other | Admitting: Family Medicine

## 2020-08-25 ENCOUNTER — Other Ambulatory Visit: Payer: Self-pay

## 2020-08-25 ENCOUNTER — Encounter: Payer: Self-pay | Admitting: Family Medicine

## 2020-08-25 VITALS — BP 120/72 | HR 58 | Temp 97.5°F | Wt 172.8 lb

## 2020-08-25 DIAGNOSIS — I10 Essential (primary) hypertension: Secondary | ICD-10-CM | POA: Diagnosis not present

## 2020-08-25 DIAGNOSIS — R0602 Shortness of breath: Secondary | ICD-10-CM

## 2020-08-25 DIAGNOSIS — I499 Cardiac arrhythmia, unspecified: Secondary | ICD-10-CM | POA: Diagnosis not present

## 2020-08-25 DIAGNOSIS — I2510 Atherosclerotic heart disease of native coronary artery without angina pectoris: Secondary | ICD-10-CM | POA: Diagnosis not present

## 2020-08-25 NOTE — Progress Notes (Signed)
   Subjective:    Patient ID: Anne Martin, female    DOB: 09-01-1945, 75 y.o.   MRN: [id number]  HPI Here to follow up on a bronchitis and other symptoms. We saw her on 08-01-20 when she had a productive cough and chest congestion. She had been to urgent care prior to that and a CXR showed peribronchial cuffing suggestive of bronchitis. They had treated her with Doxycycline but she did not improve. We then treated her with 14 days of Azithromycin, and this made a big improvement. She now denies any coughing, and he chest is not congested. However she does complain of occasional SOB and fatigue. She sees Dr. Stanford Breed for cardiology issues, and she wore a 14 day heart monitor starting on 07-30-20. This showed mostly NSR with no atrial fibrillation. However she did have 285 episodes of SVT of 4 beats or more. The longest of these lasted 13 seconds. No chest pain. She is using her albuterol inhaler 3 times on most days.    Review of Systems  Constitutional: Negative.   Respiratory: Positive for shortness of breath. Negative for cough and wheezing.   Cardiovascular: Positive for palpitations. Negative for chest pain and leg swelling.       Objective:   Physical Exam Constitutional:      Appearance: Normal appearance. She is not ill-appearing.  Cardiovascular:     Rate and Rhythm: Normal rate. Rhythm irregular.     Pulses: Normal pulses.     Comments: 2/6 SM . Her EKG shows NSR with frequent PAC's.  Pulmonary:     Effort: Pulmonary effort is normal. No respiratory distress.     Breath sounds: Normal breath sounds. No stridor. No wheezing, rhonchi or rales.  Neurological:     General: No focal deficit present.     Mental Status: She is alert and oriented to person, place, and time.           Assessment & Plan:  Her bronchitis has resolved. She is still experiencing some SOB, and I think this may be coming from frequent ectopy. This does not seem to be from CAD. We will increase her  Metoprolol succinate to 50 mg daily. She will follow up with Dr. Stanford Breed on 09-05-20.  We spent 35 minutes together discussing these issues.  Alysia Penna, MD

## 2020-08-25 NOTE — Progress Notes (Deleted)
HPI: FU CAD. Cardiac catheterization was performed in May of 2011. This revealed Normal LM, 60 LAD, 90 D2, 50-60 LCx, normal RCA, and nl LV function. Patient had PCI of the diagonal with DES at that time. Carotid Dopplers in Oct 2013 were normal.Cardiac catheterization December 2019 showed nonobstructive coronary disease with moderate mid LAD and moderate in-stent restenosis in second diagonal and no disease in the circumflex or right coronary artery. LV function was normal with normal LV filling pressures. The ascending aorta was mildly dilated.Last echocardiogram September 2021 showed ejection fraction 60 to 65%, mild right atrial enlargement, mild aortic insufficiency, mild aortic stenosis mean gradient documented at 9 mmHg.  Seen recently for palpitations.  Monitor April 2022 showed sinus rhythm with occasional PAC, short runs of PAT and occasional PVC.  Since I last saw her,   Current Outpatient Medications  Medication Sig Dispense Refill  . albuterol (VENTOLIN HFA) 108 (90 Base) MCG/ACT inhaler Inhale 2 puffs into the lungs every 4 (four) hours as needed for wheezing or shortness of breath. 18 g 0  . atorvastatin (LIPITOR) 80 MG tablet TAKE 1 TABLET BY MOUTH EVERY DAY AT 6PM 90 tablet 3  . calcium carbonate (OSCAL) 1500 (600 Ca) MG TABS tablet Take 600 mg of elemental calcium by mouth daily.    . cholecalciferol (VITAMIN D) 1000 UNITS tablet Take 1,000 Units by mouth every evening.     . isosorbide mononitrate (IMDUR) 30 MG 24 hr tablet Take 1 tablet (30 mg total) by mouth daily. 90 tablet 3  . loratadine (CLARITIN) 10 MG tablet Take 10 mg by mouth daily.     . metoprolol succinate (TOPROL-XL) 25 MG 24 hr tablet Take 1.5 tablets (37.5 mg total) by mouth daily. 45 tablet 3  . nitroGLYCERIN (NITROSTAT) 0.4 MG SL tablet Place 1 tablet (0.4 mg total) under the tongue every 5 (five) minutes as needed. MAX 3 doses 25 tablet 3  . Probiotic Product (PROBIOTIC PO) Take 1 tablet by mouth daily.      . vitamin C (ASCORBIC ACID) 500 MG tablet Take 500 mg by mouth at bedtime.    Merril Abbe 10 MCG TABS vaginal tablet Place 10 mcg vaginally 2 (two) times a week.   1   No current facility-administered medications for this visit.     Past Medical History:  Diagnosis Date  . Allergy    seasonal  . Anemia    prior to hysterectomy, fibroids  . Aortic insufficiency    a. 10/2015 Echo: mild to moderate AI.  Marland Kitchen Aortic stenosis    a. 10/2015 Echo: moderate AS.  Marland Kitchen Blood transfusion without reported diagnosis    with hysterectomy  . CAD (coronary artery disease)    a. 08/2009 s/p cath after abnormal nuclear study; LM nl, LAD 60%, D2 90% (PCI/DES), LCX 50 -60%, RCA nl, nl EF;  b. 2012 MV: no ischemia/infarct;  c. 08/2015 MV: no ischemia/infarct, EF 70%.  . Complication of anesthesia   . Diastolic dysfunction    a. 10/2015 Echo: EF 55-60%, no rwma, Gr1 DD, mod AS, mild to mod AI.  Marland Kitchen Essential hypertension   . H/O: rheumatic fever   . History of colonoscopy   . Hyperlipidemia   . Osteopenia    last DEXA 11-22-13   . PONV (postoperative nausea and vomiting)     Past Surgical History:  Procedure Laterality Date  . ANGIOPLASTY  may 2011   stents  . AORTIC ARCH ANGIOGRAPHY N/A 03/29/2018  Procedure: AORTIC ARCH ANGIOGRAPHY;  Surgeon: Nelva Bush, MD;  Location: [facility];  Service: Cardiovascular;  Laterality: N/A;  . CHOLECYSTECTOMY  nov 2004  . COLONOSCOPY  02/02/2016   per Dr. Henrene Pastor, adenomatous polyps, repeat in 5 yrs   . INTRAVASCULAR PRESSURE WIRE/FFR STUDY N/A 03/29/2018   Procedure: INTRAVASCULAR PRESSURE WIRE/FFR STUDY;  Surgeon: Nelva Bush, MD;  Location: [facility];  Service: Cardiovascular;  Laterality: N/A;  . KNEE ARTHROSCOPY     Rt. Knee  . LEFT HEART CATH AND CORONARY ANGIOGRAPHY N/A 03/29/2018   Procedure: LEFT HEART CATH AND CORONARY ANGIOGRAPHY;  Surgeon: Nelva Bush, MD;  Location: [facility];  Service: Cardiovascular;   Laterality: N/A;  . LUMBAR LAMINECTOMY/DECOMPRESSION MICRODISCECTOMY Left 01/03/2020   Procedure: Left L2-3 decompression disectomy, Insitu Fusion Lumbar 2-3;  Surgeon: Melina Schools, MD;  Location: [facility];  Service: Orthopedics;  Laterality: Left;  . TONSILLECTOMY    . TONSILLECTOMY    . VAGINAL HYSTERECTOMY  1994    Social History   Socioeconomic History  . Marital status: Married    Spouse name: Not on file  . Number of children: 2  . Years of education: Not on file  . Highest education level: Not on file  Occupational History  . Occupation: Retired Product manager: RETIRED  Tobacco Use  . Smoking status: Former Smoker    Types: Cigarettes    Quit date: 04/12/1977    Years since quitting: 43.4  . Smokeless tobacco: Never Used  . Tobacco comment: quit age 52  Vaping Use  . Vaping Use: Never used  Substance and Sexual Activity  . Alcohol use: Yes    Alcohol/week: 0.0 standard drinks    Comment: rarely  . Drug use: No  . Sexual activity: Not Currently  Other Topics Concern  . Not on file  Social History Narrative  . Not on file   Social Determinants of Health   Financial Resource Strain: Low Risk   . Difficulty of Paying Living Expenses: Not hard at all  Food Insecurity: No Food Insecurity  . Worried About Charity fundraiser in the Last Year: Never true  . Ran Out of Food in the Last Year: Never true  Transportation Needs: No Transportation Needs  . Lack of Transportation (Medical): No  . Lack of Transportation (Non-Medical): No  Physical Activity: Inactive  . Days of Exercise per Week: 0 days  . Minutes of Exercise per Session: 0 min  Stress: No Stress Concern Present  . Feeling of Stress : Not at all  Social Connections: Socially Integrated  . Frequency of Communication with Friends and Family: More than three times a week  . Frequency of Social Gatherings with Friends and Family: More than three times a week  . Attends Religious Services: 1 to 4 times per  year  . Active Member of Clubs or Organizations: Yes  . Attends Archivist Meetings: More than 4 times per year  . Marital Status: Married  Human resources officer Violence: Not At Risk  . Fear of Current or Ex-Partner: No  . Emotionally Abused: No  . Physically Abused: No  . Sexually Abused: No    Family History  Problem Relation Age of Onset  . Heart disease Mother   . Heart disease Father        CABG  . Heart disease Brother        2 stents  . Ulcerative colitis Daughter   .  Colon cancer Neg Hx     ROS: no fevers or chills, productive cough, hemoptysis, dysphasia, odynophagia, melena, hematochezia, dysuria, hematuria, rash, seizure activity, orthopnea, PND, pedal edema, claudication. Remaining systems are negative.  Physical Exam: Well-developed well-nourished in no acute distress.  Skin is warm and dry.  HEENT is normal.  Neck is supple.  Chest is clear to auscultation with normal expansion.  Cardiovascular exam is regular rate and rhythm.  Abdominal exam nontender or distended. No masses palpated. Extremities show no edema. neuro grossly intact  ECG- personally reviewed  A/P  1 coronary artery disease-patient denies recurrent chest pain.  Plan to continue medical therapy with aspirin and statin.  2 palpitations-  3 aortic stenosis/aortic insufficiency-she will need follow-up echocardiograms in the future.  4 hypertension-blood pressure controlled.  Continue present medications and follow.  5 hyperlipidemia-continue statin.  Kirk Ruths, MD

## 2020-08-27 ENCOUNTER — Other Ambulatory Visit: Payer: Self-pay

## 2020-08-27 ENCOUNTER — Ambulatory Visit (INDEPENDENT_AMBULATORY_CARE_PROVIDER_SITE_OTHER): Payer: Medicare Other | Admitting: Cardiology

## 2020-08-27 ENCOUNTER — Encounter: Payer: Self-pay | Admitting: Cardiology

## 2020-08-27 VITALS — BP 110/68 | HR 68 | Ht 65.0 in | Wt 170.1 lb

## 2020-08-27 DIAGNOSIS — I10 Essential (primary) hypertension: Secondary | ICD-10-CM

## 2020-08-27 DIAGNOSIS — E7800 Pure hypercholesterolemia, unspecified: Secondary | ICD-10-CM | POA: Diagnosis not present

## 2020-08-27 DIAGNOSIS — I2510 Atherosclerotic heart disease of native coronary artery without angina pectoris: Secondary | ICD-10-CM

## 2020-08-27 DIAGNOSIS — I350 Nonrheumatic aortic (valve) stenosis: Secondary | ICD-10-CM | POA: Diagnosis not present

## 2020-08-27 DIAGNOSIS — R002 Palpitations: Secondary | ICD-10-CM

## 2020-08-27 NOTE — Progress Notes (Signed)
HPI: FU CAD. Cardiac catheterization was performed in May of 2011. This revealed Normal LM, 60 LAD, 90 D2, 50-60 LCx, normal RCA, and nl LV function. Patient had PCI of the diagonal with DES at that time. Carotid Dopplers in Oct 2013 were normal.Cardiac catheterization December 2019 showed nonobstructive coronary disease with moderate mid LAD and moderate in-stent restenosis in second diagonal and no disease in the circumflex or right coronary artery. LV function was normal with normal LV filling pressures. The ascending aorta was mildly dilated.CTA August 2021 showed no evidence of thoracic aortic aneurysm.  Scattered pulmonary nodules unchanged and no follow-up recommended. Echocardiogram September 2021 showed normal LV function, mild right atrial enlargement, mild aortic insufficiency, mild aortic stenosis with mean gradient 9 mmHg.  Patient seen with elevated blood pressure and palpitations April 2022.  Monitor April 2022 showed sinus rhythm with occasional PAC, brief PAT and PVC.  Since I last saw her,she had a URI back in February associated with productive cough, dyspnea.  This has slowly improved though she still has a cough.  She has some chest discomfort when she coughs but otherwise no chest pain.  She describes a brief flutter in her chest at times that has improved with higher dose Toprol.  Current Outpatient Medications  Medication Sig Dispense Refill  . albuterol (VENTOLIN HFA) 108 (90 Base) MCG/ACT inhaler Inhale 2 puffs into the lungs every 4 (four) hours as needed for wheezing or shortness of breath. 18 g 0  . aspirin 81 MG EC tablet Take 81 mg by mouth daily.    Marland Kitchen atorvastatin (LIPITOR) 80 MG tablet TAKE 1 TABLET BY MOUTH EVERY DAY AT 6PM 90 tablet 3  . calcium carbonate (OSCAL) 1500 (600 Ca) MG TABS tablet Take 600 mg of elemental calcium by mouth daily.    . cholecalciferol (VITAMIN D) 1000 UNITS tablet Take 1,000 Units by mouth every evening.     . isosorbide mononitrate  (IMDUR) 30 MG 24 hr tablet Take 1 tablet (30 mg total) by mouth daily. 90 tablet 3  . loratadine (CLARITIN) 10 MG tablet Take 10 mg by mouth daily.     . metoprolol succinate (TOPROL-XL) 50 MG 24 hr tablet Take 50 mg by mouth daily. Take with or immediately following a meal.    . nitroGLYCERIN (NITROSTAT) 0.4 MG SL tablet Place 1 tablet (0.4 mg total) under the tongue every 5 (five) minutes as needed. MAX 3 doses 25 tablet 3  . Probiotic Product (PROBIOTIC PO) Take 1 tablet by mouth daily.     . vitamin C (ASCORBIC ACID) 500 MG tablet Take 500 mg by mouth at bedtime.    Merril Abbe 10 MCG TABS vaginal tablet Place 10 mcg vaginally 2 (two) times a week.   1   No current facility-administered medications for this visit.     Past Medical History:  Diagnosis Date  . Allergy    seasonal  . Anemia    prior to hysterectomy, fibroids  . Aortic insufficiency    a. 10/2015 Echo: mild to moderate AI.  Marland Kitchen Aortic stenosis    a. 10/2015 Echo: moderate AS.  Marland Kitchen Blood transfusion without reported diagnosis    with hysterectomy  . CAD (coronary artery disease)    a. 08/2009 s/p cath after abnormal nuclear study; LM nl, LAD 60%, D2 90% (PCI/DES), LCX 50 -60%, RCA nl, nl EF;  b. 2012 MV: no ischemia/infarct;  c. 08/2015 MV: no ischemia/infarct, EF 70%.  . Complication of  anesthesia   . Diastolic dysfunction    a. 10/2015 Echo: EF 55-60%, no rwma, Gr1 DD, mod AS, mild to mod AI.  Marland Kitchen Essential hypertension   . H/O: rheumatic fever   . History of colonoscopy   . Hyperlipidemia   . Osteopenia    last DEXA 11-22-13   . PONV (postoperative nausea and vomiting)     Past Surgical History:  Procedure Laterality Date  . ANGIOPLASTY  may 2011   stents  . AORTIC ARCH ANGIOGRAPHY N/A 03/29/2018   Procedure: AORTIC ARCH ANGIOGRAPHY;  Surgeon: Nelva Bush, MD;  Location: [facility];  Service: Cardiovascular;  Laterality: N/A;  . CHOLECYSTECTOMY  nov 2004  . COLONOSCOPY  02/02/2016   per Dr. Henrene Pastor,  adenomatous polyps, repeat in 5 yrs   . INTRAVASCULAR PRESSURE WIRE/FFR STUDY N/A 03/29/2018   Procedure: INTRAVASCULAR PRESSURE WIRE/FFR STUDY;  Surgeon: Nelva Bush, MD;  Location: [facility];  Service: Cardiovascular;  Laterality: N/A;  . KNEE ARTHROSCOPY     Rt. Knee  . LEFT HEART CATH AND CORONARY ANGIOGRAPHY N/A 03/29/2018   Procedure: LEFT HEART CATH AND CORONARY ANGIOGRAPHY;  Surgeon: Nelva Bush, MD;  Location: [place];  Service: Cardiovascular;  Laterality: N/A;  . LUMBAR LAMINECTOMY/DECOMPRESSION MICRODISCECTOMY Left 01/03/2020   Procedure: Left L2-3 decompression disectomy, Insitu Fusion Lumbar 2-3;  Surgeon: Melina Schools, MD;  Location: [facility];  Service: Orthopedics;  Laterality: Left;  . TONSILLECTOMY    . TONSILLECTOMY    . VAGINAL HYSTERECTOMY  1994    Social History   Socioeconomic History  . Marital status: Married    Spouse name: Not on file  . Number of children: 2  . Years of education: Not on file  . Highest education level: Not on file  Occupational History  . Occupation: Retired Product manager: RETIRED  Tobacco Use  . Smoking status: Former Smoker    Types: Cigarettes    Quit date: 04/12/1977    Years since quitting: 43.4  . Smokeless tobacco: Never Used  . Tobacco comment: quit age 72  Vaping Use  . Vaping Use: Never used  Substance and Sexual Activity  . Alcohol use: Yes    Alcohol/week: 0.0 standard drinks    Comment: rarely  . Drug use: No  . Sexual activity: Not Currently  Other Topics Concern  . Not on file  Social History Narrative  . Not on file   Social Determinants of Health   Financial Resource Strain: Low Risk   . Difficulty of Paying Living Expenses: Not hard at all  Food Insecurity: No Food Insecurity  . Worried About Charity fundraiser in the Last Year: Never true  . Ran Out of Food in the Last Year: Never true  Transportation Needs: No Transportation Needs  . Lack of Transportation (Medical):  No  . Lack of Transportation (Non-Medical): No  Physical Activity: Inactive  . Days of Exercise per Week: 0 days  . Minutes of Exercise per Session: 0 min  Stress: No Stress Concern Present  . Feeling of Stress : Not at all  Social Connections: Socially Integrated  . Frequency of Communication with Friends and Family: More than three times a week  . Frequency of Social Gatherings with Friends and Family: More than three times a week  . Attends Religious Services: 1 to 4 times per year  . Active Member of Clubs or Organizations: Yes  . Attends Archivist Meetings: More than  4 times per year  . Marital Status: Married  Human resources officer Violence: Not At Risk  . Fear of Current or Ex-Partner: No  . Emotionally Abused: No  . Physically Abused: No  . Sexually Abused: No    Family History  Problem Relation Age of Onset  . Heart disease Mother   . Heart disease Father        CABG  . Heart disease Brother        2 stents  . Ulcerative colitis Daughter   . Colon cancer Neg Hx     ROS: no fevers or chills, productive cough, hemoptysis, dysphasia, odynophagia, melena, hematochezia, dysuria, hematuria, rash, seizure activity, orthopnea, PND, pedal edema, claudication. Remaining systems are negative.  Physical Exam: Well-developed well-nourished in no acute distress.  Skin is warm and dry.  HEENT is normal.  Neck is supple.  Chest is clear to auscultation with normal expansion.  Cardiovascular exam is regular rate and rhythm.  Abdominal exam nontender or distended. No masses palpated. Extremities show no edema. neuro grossly intact  Electrocardiogram-Aug 25, 2020-sinus rhythm with PACs, no ST changes.  Personally reviewed  A/P  1 coronary artery disease-continue aspirin and statin.  She has had recent URI associated with productive cough and chest pain with cough but no other symptoms.  Electrocardiogram recently showed no ST changes.  2 Hypertension-blood pressure  controlled.  Continue present medications and follow.  3 hyperlipidemia-continue statin.  4 aortic stenosis/aortic insufficiency-she will need follow-up echoes in the future.  5 palpitations-recent monitor with no significant arrhythmia.  Toprol was recently increased and her symptoms have improved.  We will continue.   Kirk Ruths, MD

## 2020-08-27 NOTE — Patient Instructions (Signed)

## 2020-08-28 ENCOUNTER — Other Ambulatory Visit: Payer: Self-pay | Admitting: Family Medicine

## 2020-09-05 ENCOUNTER — Emergency Department (HOSPITAL_COMMUNITY)
Admission: EM | Admit: 2020-09-05 | Discharge: 2020-09-05 | Disposition: A | Discharge status: Home / Self Care | Payer: Medicare Other | Attending: Emergency Medicine | Admitting: Emergency Medicine

## 2020-09-05 ENCOUNTER — Encounter (HOSPITAL_COMMUNITY): Payer: Self-pay | Admitting: *Deleted

## 2020-09-05 ENCOUNTER — Encounter: Payer: Self-pay | Admitting: Family Medicine

## 2020-09-05 ENCOUNTER — Other Ambulatory Visit: Payer: Self-pay

## 2020-09-05 ENCOUNTER — Emergency Department (HOSPITAL_COMMUNITY): Payer: Medicare Other

## 2020-09-05 ENCOUNTER — Telehealth: Payer: Medicare Other | Admitting: Cardiology

## 2020-09-05 DIAGNOSIS — I110 Hypertensive heart disease with heart failure: Secondary | ICD-10-CM | POA: Diagnosis not present

## 2020-09-05 DIAGNOSIS — I2510 Atherosclerotic heart disease of native coronary artery without angina pectoris: Secondary | ICD-10-CM | POA: Insufficient documentation

## 2020-09-05 DIAGNOSIS — R61 Generalized hyperhidrosis: Secondary | ICD-10-CM | POA: Insufficient documentation

## 2020-09-05 DIAGNOSIS — R0602 Shortness of breath: Secondary | ICD-10-CM | POA: Diagnosis present

## 2020-09-05 DIAGNOSIS — Z7982 Long term (current) use of aspirin: Secondary | ICD-10-CM | POA: Diagnosis not present

## 2020-09-05 DIAGNOSIS — I5030 Unspecified diastolic (congestive) heart failure: Secondary | ICD-10-CM | POA: Diagnosis not present

## 2020-09-05 DIAGNOSIS — Z87891 Personal history of nicotine dependence: Secondary | ICD-10-CM | POA: Insufficient documentation

## 2020-09-05 DIAGNOSIS — Z79899 Other long term (current) drug therapy: Secondary | ICD-10-CM | POA: Diagnosis not present

## 2020-09-05 LAB — BASIC METABOLIC PANEL
Anion gap: 7 (ref 5–15)
BUN: 22 mg/dL (ref 8–23)
CO2: 28 mmol/L (ref 22–32)
Calcium: 9.5 mg/dL (ref 8.9–10.3)
Chloride: 106 mmol/L (ref 98–111)
Creatinine, Ser: 0.92 mg/dL (ref 0.44–1.00)
GFR, Estimated: >60 mL/min (ref >60)
Glucose, Bld: 79 mg/dL (ref 70–99)
Potassium: 3.8 mmol/L (ref 3.5–5.1)
Sodium: 141 mmol/L (ref 135–145)

## 2020-09-05 LAB — CBC
HCT: 45.5 % (ref 36.0–46.0)
Hemoglobin: 14.5 g/dL (ref 12.0–15.0)
MCH: 26.3 pg (ref 26.0–34.0)
MCHC: 31.9 g/dL (ref 30.0–36.0)
MCV: 82.6 fL (ref 80.0–100.0)
Platelets: 218 10*3/uL (ref 150–400)
RBC: 5.51 MIL/uL — ABNORMAL HIGH (ref 3.87–5.11)
RDW: 14.4 % (ref 11.5–15.5)
WBC: 10.4 10*3/uL (ref 4.0–10.5)
nRBC: 0 % (ref 0.0–0.2)

## 2020-09-05 LAB — TROPONIN I (HIGH SENSITIVITY)
Troponin I (High Sensitivity): 4 ng/L (ref <18)
Troponin I (High Sensitivity): 4 ng/L (ref <18)

## 2020-09-05 NOTE — ED Triage Notes (Addendum)
Patient presents to ed via [facility] states she was riding in the car and had a 2 min period of sob and diaphoresis. States she went to the local [facility] and ems was called. States all her sx. Had resolved by the time ems arrived. Recently wore a cardiac monitor and nothing abn. Was found. States she normally takes 50 mg Lopressor every morning and hasn't had her medication today. States she is presently at her norm.  Patient was given [id number] mg per ems

## 2020-09-05 NOTE — Discharge Instructions (Signed)
Please read and follow all provided instructions.  Your diagnoses today include:  1. SOB (shortness of breath)     Tests performed today include:  An EKG of your heart  A chest x-ray  Cardiac enzymes - a blood test for heart muscle damage  Blood counts and electrolytes  Vital signs. See below for your results today.   Medications prescribed:   None  Take any prescribed medications only as directed.  Follow-up instructions: Please follow-up with your primary care provider as soon as you can for further evaluation of your symptoms.   Return instructions:  SEEK IMMEDIATE MEDICAL ATTENTION IF:  You have severe chest pain, especially if the pain is crushing or pressure-like and spreads to the arms, back, neck, or jaw, or if you have sweating, nausea (feeling sick to your stomach), or shortness of breath. THIS IS AN EMERGENCY. Don't wait to see if the pain will go away. Get medical help at once. Call 911 or 0 (operator). DO NOT drive yourself to the hospital.   Your chest pain gets worse and does not go away with rest.   You have an attack of chest pain lasting longer than usual, despite rest and treatment with the medications your caregiver has prescribed.   You wake from sleep with chest pain or shortness of breath.  You feel dizzy or faint.  You have chest pain not typical of your usual pain for which you originally saw your caregiver.   You have any other emergent concerns regarding your health.  Additional Information: Your caregiver has diagnosed you as having shortness of breath that is not specific for one problem, but does not require admission.  You are at low risk for an acute heart condition or other serious illness.   Your vital signs today were: BP 136/61   Pulse 64   Temp 97.7 F (36.5 C) (Oral)   Resp 15   Ht 5\' 5"  (1.651 m)   Wt 74.8 kg   LMP  (LMP Unknown)   SpO2 99%   BMI 27.46 kg/m  If your blood pressure (BP) was elevated above 135/85 this  visit, please have this repeated by your doctor within one month. --------------

## 2020-09-05 NOTE — ED Provider Notes (Signed)
[facility] EMERGENCY DEPARTMENT Provider Note   CSN: [id number] Arrival date & time: 09/05/20  1001     History Chief Complaint  Patient presents with  . Shortness of Breath    Anne Martin is a 75 y.o. female.  Patient with history of aortic stenosis, ACS status post stent --presents to the emergency department today for evaluation of shortness of breath.  Patient was driving in a car with her husband when she suddenly felt short of breath.  She did not have any associated chest, neck, or arm or back pain.  She did not have any palpitations.  She did not feel lightheaded and did not feel like she was going to pass out.  She became very diaphoretic and soaked her shirt.  They were near a fire department so she stopped by and EMS evaluated her.  After just a couple of minutes she returned to normal.  She has been dealing with bronchitis type symptoms for several months, however these seem to be improving.  No associated nausea, vomiting, or diarrhea.  She did wear a Holter monitor in the past couple of months which just showed some benign findings.Patient denies risk factors for pulmonary embolism including: unilateral leg swelling, history of DVT/PE/other blood clots, use of exogenous hormones, recent immobilizations, recent surgery, recent travel (>4hr segment), malignancy, hemoptysis.          Past Medical History:  Diagnosis Date  . Allergy    seasonal  . Anemia    prior to hysterectomy, fibroids  . Aortic insufficiency    a. 10/2015 Echo: mild to moderate AI.  Marland Kitchen Aortic stenosis    a. 10/2015 Echo: moderate AS.  Marland Kitchen Blood transfusion without reported diagnosis    with hysterectomy  . CAD (coronary artery disease)    a. 08/2009 s/p cath after abnormal nuclear study; LM nl, LAD 60%, D2 90% (PCI/DES), LCX 50 -60%, RCA nl, nl EF;  b. 2012 MV: no ischemia/infarct;  c. 08/2015 MV: no ischemia/infarct, EF 70%.  . Complication of anesthesia   . Diastolic dysfunction     a. 10/2015 Echo: EF 55-60%, no rwma, Gr1 DD, mod AS, mild to mod AI.  Marland Kitchen Essential hypertension   . H/O: rheumatic fever   . History of colonoscopy   . Hyperlipidemia   . Osteopenia    last DEXA 11-22-13   . PONV (postoperative nausea and vomiting)     Patient Active Problem List   Diagnosis Date Noted  . Lumbar herniated disc 01/03/2020  . Lumbar disc herniation 01/03/2020  . Unstable angina ([place]) 03/28/2018  . Chest pain 10/28/2016  . Aortic insufficiency 02/29/2016  . Essential hypertension 02/29/2016  . Musculoskeletal chest pain 02/28/2016  . Aortic stenosis 02/24/2012  . Hyperlipidemia 11/07/2009  . CAD (coronary artery disease) 09/15/2009  . CAROTID BRUIT 09/15/2009  . CONTACT DERMATITIS 01/28/2009  . Disorder of bone and cartilage 12/17/2008  . ALLERGIC RHINITIS 08/16/2007  . KNEE PAIN 08/16/2007  . History of cardiovascular disorder 08/16/2007    Past Surgical History:  Procedure Laterality Date  . ANGIOPLASTY  may 2011   stents  . AORTIC ARCH ANGIOGRAPHY N/A 03/29/2018   Procedure: AORTIC ARCH ANGIOGRAPHY;  Surgeon: Nelva Bush, MD;  Location: [facility];  Service: Cardiovascular;  Laterality: N/A;  . CHOLECYSTECTOMY  nov 2004  . COLONOSCOPY  02/02/2016   per Dr. Henrene Pastor, adenomatous polyps, repeat in 5 yrs   . INTRAVASCULAR PRESSURE WIRE/FFR STUDY N/A 03/29/2018   Procedure:  INTRAVASCULAR PRESSURE WIRE/FFR STUDY;  Surgeon: Nelva Bush, MD;  Location: [facility];  Service: Cardiovascular;  Laterality: N/A;  . KNEE ARTHROSCOPY     Rt. Knee  . LEFT HEART CATH AND CORONARY ANGIOGRAPHY N/A 03/29/2018   Procedure: LEFT HEART CATH AND CORONARY ANGIOGRAPHY;  Surgeon: Nelva Bush, MD;  Location: [facility];  Service: Cardiovascular;  Laterality: N/A;  . LUMBAR LAMINECTOMY/DECOMPRESSION MICRODISCECTOMY Left 01/03/2020   Procedure: Left L2-3 decompression disectomy, Insitu Fusion Lumbar 2-3;  Surgeon: Melina Schools, MD;  Location: [facility];   Service: Orthopedics;  Laterality: Left;  . TONSILLECTOMY    . TONSILLECTOMY    . VAGINAL HYSTERECTOMY  1994     OB History   No obstetric history on file.     Family History  Problem Relation Age of Onset  . Heart disease Mother   . Heart disease Father        CABG  . Heart disease Brother        2 stents  . Ulcerative colitis Daughter   . Colon cancer Neg Hx     Social History   Tobacco Use  . Smoking status: Former Smoker    Types: Cigarettes    Quit date: 04/12/1977    Years since quitting: 43.4  . Smokeless tobacco: Never Used  . Tobacco comment: quit age 49  Vaping Use  . Vaping Use: Never used  Substance Use Topics  . Alcohol use: Not Currently    Alcohol/week: 0.0 standard drinks    Comment: rarely  . Drug use: No    Home Medications Prior to Admission medications   Medication Sig Start Date End Date Taking? Authorizing Provider  albuterol (VENTOLIN HFA) 108 (90 Base) MCG/ACT inhaler INHALE 2 PUFFS INTO THE LUNGS EVERY 4 HOURS AS NEEDED FOR WHEEZING OR SHORTNESS OF BREATH. 08/28/20   Laurey Morale, MD  aspirin 81 MG EC tablet Take 81 mg by mouth daily.    [provider]  atorvastatin (LIPITOR) 80 MG tablet TAKE 1 TABLET BY MOUTH EVERY DAY AT 6PM 08/12/20   Lelon Perla, MD  calcium carbonate (OSCAL) 1500 (600 Ca) MG TABS tablet Take 600 mg of elemental calcium by mouth daily.    [provider]  cholecalciferol (VITAMIN D) 1000 UNITS tablet Take 1,000 Units by mouth every evening.     [provider]  isosorbide mononitrate (IMDUR) 30 MG 24 hr tablet Take 1 tablet (30 mg total) by mouth daily. 04/18/20   Lelon Perla, MD  loratadine (CLARITIN) 10 MG tablet Take 10 mg by mouth daily.     [provider]  metoprolol succinate (TOPROL-XL) 50 MG 24 hr tablet Take 50 mg by mouth daily. Take with or immediately following a meal.    [provider]  nitroGLYCERIN (NITROSTAT) 0.4 MG SL tablet Place 1 tablet (0.4 mg  total) under the tongue every 5 (five) minutes as needed. MAX 3 doses 03/28/18   Barrett, Evelene Croon, PA-C  Probiotic Product (PROBIOTIC PO) Take 1 tablet by mouth daily.     [provider]  vitamin C (ASCORBIC ACID) 500 MG tablet Take 500 mg by mouth at bedtime.    [provider]  YUVAFEM 10 MCG TABS vaginal tablet Place 10 mcg vaginally 2 (two) times a week.  10/29/17   [provider]    Allergies    Procaine hcl  Review of Systems   Review of Systems  Constitutional: Positive for diaphoresis. Negative  for fever.  Eyes: Negative for redness.  Respiratory: Positive for shortness of breath. Negative for cough.   Cardiovascular: Negative for chest pain, palpitations and leg swelling.  Gastrointestinal: Negative for abdominal pain, nausea and vomiting.  Genitourinary: Negative for dysuria.  Musculoskeletal: Negative for back pain and neck pain.  Skin: Negative for rash.  Neurological: Negative for syncope and light-headedness.  Psychiatric/Behavioral: The patient is not nervous/anxious.     Physical Exam Updated Vital Signs BP (!) 153/71 (BP Location: Right Arm)   Pulse 65   Temp 97.7 F (36.5 C) (Oral)   Resp 14   Ht 5\' 5"  (1.651 m)   Wt 74.8 kg   LMP  (LMP Unknown)   SpO2 99%   BMI 27.46 kg/m   Physical Exam Vitals and nursing note reviewed.  Constitutional:      Appearance: She is well-developed. She is not diaphoretic.  HENT:     Head: Normocephalic and atraumatic.     Mouth/Throat:     Mouth: Mucous membranes are not dry.  Eyes:     Conjunctiva/sclera: Conjunctivae normal.  Neck:     Vascular: Normal carotid pulses. No carotid bruit or JVD.     Trachea: Trachea normal. No tracheal deviation.  Cardiovascular:     Rate and Rhythm: Normal rate and regular rhythm.     Pulses: No decreased pulses.     Heart sounds: S1 normal and S2 normal. Murmur heard.    Pulmonary:     Effort: Pulmonary effort is normal. No respiratory distress.      Breath sounds: No wheezing.  Chest:     Chest wall: No tenderness.  Abdominal:     General: Bowel sounds are normal.     Palpations: Abdomen is soft.     Tenderness: There is no abdominal tenderness. There is no guarding or rebound.  Musculoskeletal:        General: Normal range of motion.     Cervical back: Normal range of motion and neck supple. No muscular tenderness.     Right lower leg: No tenderness. No edema.     Left lower leg: No tenderness. No edema.  Skin:    General: Skin is warm and dry.     Coloration: Skin is not pale.  Neurological:     Mental Status: She is alert.     ED Results / Procedures / Treatments   Labs (all labs ordered are listed, but only abnormal results are displayed) Labs Reviewed  CBC - Abnormal; Notable for the following components:      Result Value   RBC 5.51 (*)    All other components within normal limits  BASIC METABOLIC PANEL  TROPONIN I (HIGH SENSITIVITY)  TROPONIN I (HIGH SENSITIVITY)    EKG EKG Interpretation  Date/Time:  Friday Sep 05 2020 10:09:18 EDT Ventricular Rate:  65 PR Interval:  145 QRS Duration: 97 QT Interval:  391 QTC Calculation: 407 R Axis:   68 Text Interpretation: Sinus rhythm Biatrial enlargement similar to prior tracing Confirmed by Nanda Quinton [phone]) on 09/05/2020 11:08:02 AM   Radiology DG Chest 2 View  Result Date: 09/05/2020 CLINICAL DATA:  Shortness of breath EXAM: CHEST - 2 VIEW COMPARISON:  July 14, 2020 FINDINGS: The heart size and mediastinal contours are within normal limits. No focal consolidation. No pleural effusion. No pneumothorax. The visualized skeletal structures are unremarkable. IMPRESSION: No acute cardiopulmonary disease. Electronically Signed   By: Dahlia Bailiff MD   On: 09/05/2020 11:19  Procedures Procedures   Medications Ordered in ED Medications - No data to display  ED Course  I have reviewed the triage vital signs and the nursing notes.  Pertinent labs & imaging  results that were available during my care of the patient were reviewed by me and considered in my medical decision making (see chart for details).  Patient seen and examined. Work-up initiated. EKG reviewed. Bedside EMS strips with a few PACs, NSR.   Vital signs reviewed and are as follows: BP (!) 153/71 (BP Location: Right Arm)   Pulse 65   Temp 97.7 F (36.5 C) (Oral)   Resp 14   Ht 5\' 5"  (1.651 m)   Wt 74.8 kg   LMP  (LMP Unknown)   SpO2 99%   BMI 27.46 kg/m   1:15 PM patient discussed with and seen by Dr. Laverta Baltimore.  Work-up here is completely reassuring.  I reviewed patient's telemetry which did not show any events.  Plan for discharge to home at this time as she is back at her baseline and feeling well.  She will continue home Lopressor.  Patient was counseled to return with severe chest pain, especially if the pain is crushing or pressure-like and spreads to the arms, back, neck, or jaw, or if they have sweating, nausea, or shortness of breath with the pain. They were encouraged to call 911 with these symptoms.   The patient verbalized understanding and agreed.      MDM Rules/Calculators/A&P                          Patient with cardiac history with shortness of breath.  Symptoms atypical for ACS.  No risk factors for PE.  Symptoms resolved and patient returned to baseline after several minutes.  No lightheadedness or syncope.  No signs of arrhythmia or EKG abnormalities here.  Work-up is reassuring.  Doubt emergent cause of symptoms.  She will be discharged home with strict return instructions as above, close follow-up with her PCP and cardiologist.   Final Clinical Impression(s) / ED Diagnoses Final diagnoses:  SOB (shortness of breath)    Rx / DC Orders ED Discharge Orders    None       Carlisle Cater, PA-C 09/05/20 [place], [facility], MD 09/06/20 [phone]

## 2020-09-09 NOTE — Telephone Encounter (Signed)
Noted  

## 2020-11-26 ENCOUNTER — Other Ambulatory Visit: Payer: Self-pay | Admitting: General Practice

## 2020-11-26 NOTE — Telephone Encounter (Signed)
Rx(s) sent to pharmacy electronically.  

## 2020-12-15 ENCOUNTER — Encounter: Payer: Self-pay | Admitting: Family Medicine

## 2020-12-16 NOTE — Telephone Encounter (Signed)
This is a common side effect of taking steroids. This should resolve in the next few weeks. If it doesn't, have her come in to see [place]

## 2020-12-29 ENCOUNTER — Telehealth: Payer: Self-pay | Admitting: Family Medicine

## 2020-12-29 NOTE — Telephone Encounter (Signed)
PT called to see if Dr.Fry recommends her to get that new covid booster that is coming out. Please advise.

## 2020-12-30 NOTE — Telephone Encounter (Signed)
Please advise 

## 2020-12-31 ENCOUNTER — Encounter: Payer: Self-pay | Admitting: Family Medicine

## 2020-12-31 NOTE — Telephone Encounter (Signed)
Spoke with patient message given.

## 2020-12-31 NOTE — Telephone Encounter (Signed)
Yes I recommend both she and her husband get this

## 2021-01-01 NOTE — Telephone Encounter (Signed)
Yes it is okay to go back and forth between [employer] and [employer]

## 2021-01-29 ENCOUNTER — Encounter: Payer: Self-pay | Admitting: Family Medicine

## 2021-01-29 ENCOUNTER — Telehealth: Payer: Self-pay | Admitting: Cardiology

## 2021-01-29 NOTE — Telephone Encounter (Signed)
Spoke with pt and has noted some SOB for a week not sure if heart related is wanting to be evaluated before leaving for [place] on 02/07/21  Pt refused appointment with APP Per pt had similar symptoms in spring and was dx with bronchitis Pt is seeing PCP on 10/21  and is due for 6 mo f/u in November with Dr Stanford Breed  Appt made for 02/02/21 at 8:30 with Dr Stanford Breed .Adonis Housekeeper

## 2021-01-29 NOTE — Telephone Encounter (Signed)
Pt c/o Shortness Of Breath: STAT if SOB developed within the last 24 hours or pt is noticeably SOB on the phone  1. Are you currently SOB (can you hear that pt is SOB on the phone)? N/a, pt sent message to scheduling regarding an appt with Dr. Stanford Breed for SOB.   2. How long have you been experiencing SOB? N/a  3. Are you SOB when sitting or when up moving around? Both, pt states that he experiences it at different times throughout the day.   4. Are you currently experiencing any other symptoms? No  Patient sent message to scheduling stating,  "One reason I am anxious to see Dr. Stanford Breed is that after now a second back surgery within the year, I am just getting back to exercise. I do experience some shortness of breath and want to make sure it isnt heart related. I will be leaving for [facility] October 29th. I feel the shortness of breath at different times..not necessarily when exercising. I have no other symptoms."

## 2021-01-30 ENCOUNTER — Ambulatory Visit (INDEPENDENT_AMBULATORY_CARE_PROVIDER_SITE_OTHER): Payer: Medicare Other | Admitting: Family Medicine

## 2021-01-30 ENCOUNTER — Encounter: Payer: Self-pay | Admitting: Family Medicine

## 2021-01-30 ENCOUNTER — Other Ambulatory Visit: Payer: Self-pay

## 2021-01-30 VITALS — BP 118/78 | HR 61 | Temp 97.5°F | Wt 174.0 lb

## 2021-01-30 DIAGNOSIS — I491 Atrial premature depolarization: Secondary | ICD-10-CM | POA: Diagnosis not present

## 2021-01-30 DIAGNOSIS — I2510 Atherosclerotic heart disease of native coronary artery without angina pectoris: Secondary | ICD-10-CM

## 2021-01-30 DIAGNOSIS — I10 Essential (primary) hypertension: Secondary | ICD-10-CM | POA: Diagnosis not present

## 2021-01-30 DIAGNOSIS — R0602 Shortness of breath: Secondary | ICD-10-CM | POA: Diagnosis not present

## 2021-01-30 NOTE — Progress Notes (Signed)
   Subjective:    Patient ID: Anne Martin, female    DOB: 05-Jun-1945, 75 y.o.   MRN: [id number]  HPI Here for 2 weeks of mild intermittent SOB. This usually occurs at rest. She has an occasional cough, but she does not feel sick per se. No wheezing or chest pain. No fever. No GERD symptoms. She does not feel palpitations in the chest. When she saw Dr. Stanford Breed in May, her EKG showed NSR with a few PAC's.    Review of Systems  Constitutional: Negative.   Respiratory:  Positive for shortness of breath. Negative for cough, chest tightness and wheezing.   Cardiovascular: Negative.       Objective:   Physical Exam Constitutional:      General: She is not in acute distress.    Appearance: Normal appearance.  Cardiovascular:     Rate and Rhythm: Normal rate.     Pulses: Normal pulses.     Comments: Underlying rhythm is regular but she has runs of 3-4 fast beats after every 8th to 10th beat. Her EKG today shows NSR with a run of 3 PAC's. She has her usual 2/6 SM.  Musculoskeletal:     Right lower leg: No edema.     Left lower leg: No edema.  Neurological:     Mental Status: She is alert.          Assessment & Plan:  I told her that her heart seems to be doing fine. These PAC's are usually of little significance, and they are not causing her feeling of SOB. I think these are from her allergies instead. She has an albuterol inhaler at home, but she has never used it. I advised her to use this whenever she feels SOB. Follow up as needed. We spent a total of ( 34  ) minutes reviewing records and discussing these issues.  Alysia Penna, MD

## 2021-02-02 ENCOUNTER — Ambulatory Visit (INDEPENDENT_AMBULATORY_CARE_PROVIDER_SITE_OTHER): Payer: Medicare Other | Admitting: Cardiology

## 2021-02-02 ENCOUNTER — Encounter: Payer: Self-pay | Admitting: Cardiology

## 2021-02-02 ENCOUNTER — Other Ambulatory Visit: Payer: Self-pay

## 2021-02-02 VITALS — BP 115/61 | HR 61 | Ht 65.0 in | Wt 174.6 lb

## 2021-02-02 DIAGNOSIS — E7800 Pure hypercholesterolemia, unspecified: Secondary | ICD-10-CM | POA: Diagnosis not present

## 2021-02-02 DIAGNOSIS — E785 Hyperlipidemia, unspecified: Secondary | ICD-10-CM

## 2021-02-02 DIAGNOSIS — R0609 Other forms of dyspnea: Secondary | ICD-10-CM

## 2021-02-02 DIAGNOSIS — I2510 Atherosclerotic heart disease of native coronary artery without angina pectoris: Secondary | ICD-10-CM

## 2021-02-02 LAB — HEPATIC FUNCTION PANEL
ALT: 25 IU/L (ref 0–32)
AST: 32 IU/L (ref 0–40)
Albumin: 4.9 g/dL — ABNORMAL HIGH (ref 3.7–4.7)
Alkaline Phosphatase: 168 IU/L — ABNORMAL HIGH (ref 44–121)
Bilirubin Total: 0.7 mg/dL (ref 0.0–1.2)
Bilirubin, Direct: 0.24 mg/dL (ref 0.00–0.40)
Total Protein: 6.8 g/dL (ref 6.0–8.5)

## 2021-02-02 LAB — LIPID PANEL
Chol/HDL Ratio: 1.8 ratio (ref 0.0–4.4)
Cholesterol, Total: 119 mg/dL (ref 100–199)
HDL: 66 mg/dL (ref >39)
LDL Chol Calc (NIH): 39 mg/dL (ref 0–99)
Triglycerides: 67 mg/dL (ref 0–149)
VLDL Cholesterol Cal: 14 mg/dL (ref 5–40)

## 2021-02-02 NOTE — Patient Instructions (Signed)

## 2021-02-02 NOTE — Progress Notes (Signed)
HPI: FU CAD. Cardiac catheterization was performed in May of 2011. This revealed Normal LM, 60 LAD, 90 D2, 50-60 LCx, normal RCA, and nl LV function. Patient had PCI of the diagonal with DES at that time. Carotid Dopplers in Oct 2013 were normal. Cardiac catheterization December 2019 showed nonobstructive coronary disease with moderate mid LAD and moderate in-stent restenosis in second diagonal and no disease in the circumflex or right coronary artery. LV function was normal with normal LV filling pressures. The ascending aorta was mildly dilated. CTA August 2021 showed no evidence of thoracic aortic aneurysm.  Scattered pulmonary nodules unchanged and no follow-up recommended. Echocardiogram September 2021 showed normal LV function, mild right atrial enlargement, mild aortic insufficiency, mild aortic stenosis with mean gradient 9 mmHg.  Patient seen with elevated blood pressure and palpitations April 2022.  Monitor April 2022 showed sinus rhythm with occasional PAC, brief PAT and PVC.  Since I last saw her, patient states that she had a week of dyspnea on exertion.  However she was given an inhaler by primary care and her symptoms have resolved.  She now denies dyspnea on exertion, chest pain, palpitations or syncope.  No orthopnea, PND or pedal edema.  Current Outpatient Medications  Medication Sig Dispense Refill   albuterol (VENTOLIN HFA) 108 (90 Base) MCG/ACT inhaler INHALE 2 PUFFS INTO THE LUNGS EVERY 4 HOURS AS NEEDED FOR WHEEZING OR SHORTNESS OF BREATH. 18 each 3   aspirin 81 MG EC tablet Take 81 mg by mouth daily.     atorvastatin (LIPITOR) 80 MG tablet TAKE 1 TABLET BY MOUTH EVERY DAY AT 6PM 90 tablet 3   calcium carbonate (OSCAL) 1500 (600 Ca) MG TABS tablet Take 600 mg of elemental calcium by mouth daily.     cholecalciferol (VITAMIN D) 1000 UNITS tablet Take 1,000 Units by mouth every evening.      isosorbide mononitrate (IMDUR) 30 MG 24 hr tablet Take 1 tablet (30 mg total) by mouth  daily. 90 tablet 3   loratadine (CLARITIN) 10 MG tablet Take 10 mg by mouth daily.      metoprolol succinate (TOPROL-XL) 50 MG 24 hr tablet Take 50 mg by mouth daily. Take with or immediately following a meal.     nitroGLYCERIN (NITROSTAT) 0.4 MG SL tablet Place 1 tablet (0.4 mg total) under the tongue every 5 (five) minutes as needed. MAX 3 doses 25 tablet 3   Probiotic Product (PROBIOTIC PO) Take 1 tablet by mouth daily.      vitamin C (ASCORBIC ACID) 500 MG tablet Take 500 mg by mouth at bedtime.     YUVAFEM 10 MCG TABS vaginal tablet Place 10 mcg vaginally 2 (two) times a week.   1   No current facility-administered medications for this visit.     Past Medical History:  Diagnosis Date   Allergy    seasonal   Anemia    prior to hysterectomy, fibroids   Aortic insufficiency    a. 10/2015 Echo: mild to moderate AI.   Aortic stenosis    a. 10/2015 Echo: moderate AS.   Blood transfusion without reported diagnosis    with hysterectomy   CAD (coronary artery disease)    a. 08/2009 s/p cath after abnormal nuclear study; LM nl, LAD 60%, D2 90% (PCI/DES), LCX 50 -60%, RCA nl, nl EF;  b. 2012 MV: no ischemia/infarct;  c. 08/2015 MV: no ischemia/infarct, EF 70%.   Complication of anesthesia    Diastolic dysfunction  a. 10/2015 Echo: EF 55-60%, no rwma, Gr1 DD, mod AS, mild to mod AI.   Essential hypertension    H/O: rheumatic fever    History of colonoscopy    Hyperlipidemia    Osteopenia    last DEXA 11-22-13    PONV (postoperative nausea and vomiting)     Past Surgical History:  Procedure Laterality Date   ANGIOPLASTY  may 2011   stents   AORTIC ARCH ANGIOGRAPHY N/A 03/29/2018   Procedure: AORTIC ARCH ANGIOGRAPHY;  Surgeon: Nelva Bush, MD;  Location: [facility];  Service: Cardiovascular;  Laterality: N/A;   CHOLECYSTECTOMY  nov 2004   COLONOSCOPY  02/02/2016   per Dr. Henrene Pastor, adenomatous polyps, repeat in 5 yrs    INTRAVASCULAR PRESSURE WIRE/FFR STUDY N/A 03/29/2018    Procedure: INTRAVASCULAR PRESSURE WIRE/FFR STUDY;  Surgeon: Nelva Bush, MD;  Location: [facility];  Service: Cardiovascular;  Laterality: N/A;   KNEE ARTHROSCOPY     Rt. Knee   LEFT HEART CATH AND CORONARY ANGIOGRAPHY N/A 03/29/2018   Procedure: LEFT HEART CATH AND CORONARY ANGIOGRAPHY;  Surgeon: Nelva Bush, MD;  Location: [facility];  Service: Cardiovascular;  Laterality: N/A;   LUMBAR LAMINECTOMY/DECOMPRESSION MICRODISCECTOMY Left 01/03/2020   Procedure: Left L2-3 decompression disectomy, Insitu Fusion Lumbar 2-3;  Surgeon: Melina Schools, MD;  Location: [facility];  Service: Orthopedics;  Laterality: Left;   TONSILLECTOMY     TONSILLECTOMY     VAGINAL HYSTERECTOMY  1994    Social History   Socioeconomic History   Marital status: Married    Spouse name: Not on file   Number of children: 2   Years of education: Not on file   Highest education level: Not on file  Occupational History   Occupation: Retired Product manager: RETIRED  Tobacco Use   Smoking status: Former    Types: Cigarettes    Quit date: 04/12/1977    Years since quitting: 43.8   Smokeless tobacco: Never   Tobacco comments:    quit age 32  Vaping Use   Vaping Use: Never used  Substance and Sexual Activity   Alcohol use: Not Currently    Alcohol/week: 0.0 standard drinks    Comment: rarely   Drug use: No   Sexual activity: Not Currently  Other Topics Concern   Not on file  Social History Narrative   Not on file   Social Determinants of Health   Financial Resource Strain: Low Risk    Difficulty of Paying Living Expenses: Not hard at all  Food Insecurity: No Food Insecurity   Worried About Charity fundraiser in the Last Year: Never true   [place] in the Last Year: Never true  Transportation Needs: No Transportation Needs   Lack of Transportation (Medical): No   Lack of Transportation (Non-Medical): No  Physical Activity: Inactive   Days of Exercise per Week: 0 days    Minutes of Exercise per Session: 0 min  Stress: No Stress Concern Present   Feeling of Stress : Not at all  Social Connections: Socially Integrated   Frequency of Communication with Friends and Family: More than three times a week   Frequency of Social Gatherings with Friends and Family: More than three times a week   Attends Religious Services: 1 to 4 times per year   Active Member of [employer] or Organizations: Yes   Attends Archivist Meetings: More than 4 times per year   Marital  Status: Married  Human resources officer Violence: Not At Risk   Fear of Current or Ex-Partner: No   Emotionally Abused: No   Physically Abused: No   Sexually Abused: No    Family History  Problem Relation Age of Onset   Heart disease Mother    Heart disease Father        CABG   Heart disease Brother        2 stents   Ulcerative colitis Daughter    Colon cancer Neg Hx     ROS: no fevers or chills, productive cough, hemoptysis, dysphasia, odynophagia, melena, hematochezia, dysuria, hematuria, rash, seizure activity, orthopnea, PND, pedal edema, claudication. Remaining systems are negative.  Physical Exam: Well-developed well-nourished in no acute distress.  Skin is warm and dry.  HEENT is normal.  Neck is supple.  Chest is clear to auscultation with normal expansion.  Cardiovascular exam is regular rate and rhythm.  Abdominal exam nontender or distended. No masses palpated. Extremities show no edema. neuro grossly intact  ECG-January 30, 2021-sinus rhythm with PACs and no ST changes.  Personally reviewed  A/P  1 coronary artery disease-patient denies chest pain.  Plan to continue medical therapy with aspirin and statin.  Patient did have some dyspnea on exertion.  However this resolved with an inhaler.  She now denies any symptoms.  We will follow and proceed with additional testing if needed.  2 hypertension-patient's blood pressure is controlled today.  Continue present medical  regimen.  3 hyperlipidemia-continue statin.  Check lipids and liver.  4 history of palpitations-symptoms are well controlled.  Continue beta-blocker.  5 aortic stenosis/aortic insufficiency-we will likely repeat echocardiogram September 2023.  Kirk Ruths, MD

## 2021-02-05 MED ORDER — NITROGLYCERIN 0.4 MG SL SUBL: 0.4000 mg | SUBLINGUAL_TABLET | SUBLINGUAL | 3 refills | Status: DC | PRN

## 2021-02-11 DIAGNOSIS — I350 Nonrheumatic aortic (valve) stenosis: Secondary | ICD-10-CM

## 2021-02-12 NOTE — Telephone Encounter (Signed)
Follow up.

## 2021-02-27 ENCOUNTER — Other Ambulatory Visit: Payer: Self-pay | Admitting: Cardiology

## 2021-03-01 ENCOUNTER — Other Ambulatory Visit: Payer: Self-pay | Admitting: Cardiology

## 2021-03-02 MED ORDER — METOPROLOL SUCCINATE ER 50 MG PO TB24: 50.0000 mg | ORAL_TABLET | Freq: Every day | ORAL | 3 refills | Status: DC

## 2021-03-06 ENCOUNTER — Encounter: Payer: Self-pay | Admitting: Internal Medicine

## 2021-03-09 ENCOUNTER — Ambulatory Visit (HOSPITAL_COMMUNITY): Payer: Medicare Other | Attending: Cardiology

## 2021-03-09 ENCOUNTER — Other Ambulatory Visit: Payer: Self-pay

## 2021-03-09 DIAGNOSIS — I350 Nonrheumatic aortic (valve) stenosis: Secondary | ICD-10-CM | POA: Insufficient documentation

## 2021-03-09 LAB — ECHOCARDIOGRAM COMPLETE
AR max vel: 1.12 cm2
AV Area VTI: 1.06 cm2
AV Area mean vel: 1 cm2
AV Mean grad: 13.3 mmHg
AV Peak grad: 24.2 mmHg
Ao pk vel: 2.46 m/s
Area-P 1/2: 3.31 cm2
P 1/2 time: 452 msec
S' Lateral: 2.3 cm

## 2021-03-12 ENCOUNTER — Encounter: Payer: Self-pay | Admitting: Family Medicine

## 2021-03-12 ENCOUNTER — Encounter: Payer: Medicare Other | Admitting: Family Medicine

## 2021-03-12 ENCOUNTER — Emergency Department (HOSPITAL_BASED_OUTPATIENT_CLINIC_OR_DEPARTMENT_OTHER)
Admission: EM | Admit: 2021-03-12 | Discharge: 2021-03-12 | Disposition: A | Discharge status: Home / Self Care | Payer: Medicare Other | Attending: Emergency Medicine | Admitting: Emergency Medicine

## 2021-03-12 ENCOUNTER — Encounter (HOSPITAL_BASED_OUTPATIENT_CLINIC_OR_DEPARTMENT_OTHER): Payer: Self-pay | Admitting: Emergency Medicine

## 2021-03-12 ENCOUNTER — Other Ambulatory Visit: Payer: Self-pay

## 2021-03-12 ENCOUNTER — Emergency Department (HOSPITAL_BASED_OUTPATIENT_CLINIC_OR_DEPARTMENT_OTHER): Payer: Medicare Other

## 2021-03-12 DIAGNOSIS — M791 Myalgia, unspecified site: Secondary | ICD-10-CM | POA: Diagnosis present

## 2021-03-12 DIAGNOSIS — J101 Influenza due to other identified influenza virus with other respiratory manifestations: Secondary | ICD-10-CM | POA: Diagnosis not present

## 2021-03-12 DIAGNOSIS — Z20822 Contact with and (suspected) exposure to covid-19: Secondary | ICD-10-CM | POA: Diagnosis not present

## 2021-03-12 DIAGNOSIS — J111 Influenza due to unidentified influenza virus with other respiratory manifestations: Secondary | ICD-10-CM

## 2021-03-12 DIAGNOSIS — Z7982 Long term (current) use of aspirin: Secondary | ICD-10-CM | POA: Insufficient documentation

## 2021-03-12 DIAGNOSIS — I2510 Atherosclerotic heart disease of native coronary artery without angina pectoris: Secondary | ICD-10-CM | POA: Insufficient documentation

## 2021-03-12 DIAGNOSIS — Z79899 Other long term (current) drug therapy: Secondary | ICD-10-CM | POA: Insufficient documentation

## 2021-03-12 DIAGNOSIS — I509 Heart failure, unspecified: Secondary | ICD-10-CM | POA: Diagnosis not present

## 2021-03-12 DIAGNOSIS — Z87891 Personal history of nicotine dependence: Secondary | ICD-10-CM | POA: Diagnosis not present

## 2021-03-12 DIAGNOSIS — Z7951 Long term (current) use of inhaled steroids: Secondary | ICD-10-CM | POA: Diagnosis not present

## 2021-03-12 DIAGNOSIS — I110 Hypertensive heart disease with heart failure: Secondary | ICD-10-CM | POA: Diagnosis not present

## 2021-03-12 LAB — RESP PANEL BY RT-PCR (FLU A&B, COVID) ARPGX2
Influenza A by PCR: POSITIVE — AB
Influenza B by PCR: NEGATIVE
SARS Coronavirus 2 by RT PCR: NEGATIVE

## 2021-03-12 MED ORDER — GUAIFENESIN-DM 100-10 MG/5ML PO SYRP: 5.0000 mL | ORAL_SOLUTION | Freq: Three times a day (TID) | ORAL | 0 refills | Status: DC | PRN

## 2021-03-12 MED ORDER — OSELTAMIVIR PHOSPHATE 75 MG PO CAPS: 75.0000 mg | ORAL_CAPSULE | Freq: Two times a day (BID) | ORAL | 0 refills | Status: DC

## 2021-03-12 NOTE — Telephone Encounter (Signed)
We cannot see them today, but I suggest that we bring both of them in tomorrow at her physical slot, and reschedule the physical for later. That way we can treat both of them together

## 2021-03-12 NOTE — ED Triage Notes (Addendum)
Pt arrives to ED with c/o of body aches. This started yesterday. Associated symptoms include dry cough, headache, runny nose. Husband as same symptoms. No fevers/chills.

## 2021-03-12 NOTE — ED Notes (Signed)
Patient verbalizes understanding of discharge instructions. Opportunity for questioning and answers were provided. Patient discharged from ED.  °

## 2021-03-12 NOTE — ED Notes (Signed)
Patient transported to X-ray 

## 2021-03-12 NOTE — ED Provider Notes (Signed)
Anne Martin   CSN: [id number] Arrival date & time: 03/12/21  3382     History Chief Complaint  Patient presents with   Generalized Body Aches    Anne Martin is a 75 y.o. female.  HPI Patient presents with URI symptoms.  Had a cough that began yesterday.  Some clear production.  Not having chills.  Has myalgias.  Has a dull headache.  Husband has similar symptoms.  Dull chest pain.  Not feeling like previous coronary issues.  Is vaccinated for both flu and COVID.    Past Medical History:  Diagnosis Date   Allergy    seasonal   Anemia    prior to hysterectomy, fibroids   Aortic insufficiency    a. 10/2015 Echo: mild to moderate AI.   Aortic stenosis    a. 10/2015 Echo: moderate AS.   Blood transfusion without reported diagnosis    with hysterectomy   CAD (coronary artery disease)    a. 08/2009 s/p cath after abnormal nuclear study; LM nl, LAD 60%, D2 90% (PCI/DES), LCX 50 -60%, RCA nl, nl EF;  b. 2012 MV: no ischemia/infarct;  c. 08/2015 MV: no ischemia/infarct, EF 70%.   Complication of anesthesia    Diastolic dysfunction    a. 10/2015 Echo: EF 55-60%, no rwma, Gr1 DD, mod AS, mild to mod AI.   Essential hypertension    H/O: rheumatic fever    History of colonoscopy    Hyperlipidemia    Osteopenia    last DEXA 11-22-13    PONV (postoperative nausea and vomiting)     Patient Active Problem List   Diagnosis Date Noted   Lumbar herniated disc 01/03/2020   Lumbar disc herniation 01/03/2020   Unstable angina ([place]) 03/28/2018   Chest pain 10/28/2016   Aortic insufficiency 02/29/2016   Essential hypertension 02/29/2016   Musculoskeletal chest pain 02/28/2016   Aortic stenosis 02/24/2012   Hyperlipidemia 11/07/2009   CAD (coronary artery disease) 09/15/2009   CAROTID BRUIT 09/15/2009   CONTACT DERMATITIS 01/28/2009   Disorder of bone and cartilage 12/17/2008   ALLERGIC RHINITIS 08/16/2007   KNEE PAIN 08/16/2007   History of  cardiovascular disorder 08/16/2007    Past Surgical History:  Procedure Laterality Date   ANGIOPLASTY  may 2011   stents   AORTIC ARCH ANGIOGRAPHY N/A 03/29/2018   Procedure: AORTIC ARCH ANGIOGRAPHY;  Surgeon: Nelva Bush, MD;  Location: [facility];  Service: Cardiovascular;  Laterality: N/A;   CHOLECYSTECTOMY  nov 2004   COLONOSCOPY  02/02/2016   per Dr. Henrene Pastor, adenomatous polyps, repeat in 5 yrs    INTRAVASCULAR PRESSURE WIRE/FFR STUDY N/A 03/29/2018   Procedure: INTRAVASCULAR PRESSURE WIRE/FFR STUDY;  Surgeon: Nelva Bush, MD;  Location: [facility];  Service: Cardiovascular;  Laterality: N/A;   KNEE ARTHROSCOPY     Rt. Knee   LEFT HEART CATH AND CORONARY ANGIOGRAPHY N/A 03/29/2018   Procedure: LEFT HEART CATH AND CORONARY ANGIOGRAPHY;  Surgeon: Nelva Bush, MD;  Location: [facility];  Service: Cardiovascular;  Laterality: N/A;   LUMBAR LAMINECTOMY/DECOMPRESSION MICRODISCECTOMY Left 01/03/2020   Procedure: Left L2-3 decompression disectomy, Insitu Fusion Lumbar 2-3;  Surgeon: Melina Schools, MD;  Location: [facility];  Service: Orthopedics;  Laterality: Left;   TONSILLECTOMY     TONSILLECTOMY     VAGINAL HYSTERECTOMY  1994     OB History   No obstetric history on file.     Family History  Problem Relation Age of  Onset   Heart disease Mother    Heart disease Father        CABG   Heart disease Brother        2 stents   Ulcerative colitis Daughter    Colon cancer Neg Hx     Social History   Tobacco Use   Smoking status: Former    Types: Cigarettes    Quit date: 04/12/1977    Years since quitting: 43.9   Smokeless tobacco: Never   Tobacco comments:    quit age 85  Vaping Use   Vaping Use: Never used  Substance Use Topics   Alcohol use: Not Currently    Alcohol/week: 0.0 standard drinks    Comment: rarely   Drug use: No    Home Medications Prior to Admission medications   Medication Sig Start Date End Date Taking? Authorizing  Provider  guaiFENesin-dextromethorphan (ROBITUSSIN DM) 100-10 MG/5ML syrup Take 5 mLs by mouth 3 (three) times daily as needed for cough. 03/12/21  Yes Davonna Belling, MD  oseltamivir (TAMIFLU) 75 MG capsule Take 1 capsule (75 mg total) by mouth every 12 (twelve) hours. 03/12/21  Yes Davonna Belling, MD  albuterol (VENTOLIN HFA) 108 (90 Base) MCG/ACT inhaler INHALE 2 PUFFS INTO THE LUNGS EVERY 4 HOURS AS NEEDED FOR WHEEZING OR SHORTNESS OF BREATH. 08/28/20   Laurey Morale, MD  aspirin 81 MG EC tablet Take 81 mg by mouth daily.    [provider]  atorvastatin (LIPITOR) 80 MG tablet TAKE 1 TABLET BY MOUTH EVERY DAY AT 6PM 08/12/20   Lelon Perla, MD  calcium carbonate (OSCAL) 1500 (600 Ca) MG TABS tablet Take 600 mg of elemental calcium by mouth daily.    [provider]  cholecalciferol (VITAMIN D) 1000 UNITS tablet Take 1,000 Units by mouth every evening.     [provider]  isosorbide mononitrate (IMDUR) 30 MG 24 hr tablet Take 1 tablet (30 mg total) by mouth daily. 04/18/20   Lelon Perla, MD  loratadine (CLARITIN) 10 MG tablet Take 10 mg by mouth daily.     [provider]  metoprolol succinate (TOPROL-XL) 50 MG 24 hr tablet Take 1 tablet (50 mg total) by mouth daily. Take with or immediately following a meal. 03/02/21   Crenshaw, Denice Bors, MD  nitroGLYCERIN (NITROSTAT) 0.4 MG SL tablet Place 1 tablet (0.4 mg total) under the tongue every 5 (five) minutes as needed. MAX 3 doses 02/05/21   Lelon Perla, MD  Probiotic Product (PROBIOTIC PO) Take 1 tablet by mouth daily.     [provider]  vitamin C (ASCORBIC ACID) 500 MG tablet Take 500 mg by mouth at bedtime.    [provider]  YUVAFEM 10 MCG TABS vaginal tablet Place 10 mcg vaginally 2 (two) times a week.  10/29/17   [provider]    Allergies    Procaine and Procaine hcl  Review of Systems   Review of Systems  Constitutional:  Positive for appetite change and  fatigue.  HENT:  Positive for congestion.   Respiratory:  Positive for cough.   Cardiovascular:  Positive for chest pain.  Gastrointestinal:  Negative for abdominal pain.  Genitourinary:  Negative for flank pain.  Musculoskeletal:  Negative for back pain.  Skin:  Negative for rash.  Neurological:  Negative for weakness.  Psychiatric/Behavioral:  Negative for confusion.    Physical Exam Updated Vital Signs BP 122/61 (BP Location: Right Arm)   Pulse 68  Temp 98.3 F (36.8 C) (Oral)   Resp 16   Ht 5\' 5"  (1.651 m)   Wt 76.2 kg   LMP  (LMP Unknown)   SpO2 97%   BMI 27.96 kg/m   Physical Exam Vitals and nursing Martin reviewed.  HENT:     Nose: Congestion present.     Mouth/Throat:     Mouth: Mucous membranes are moist.  Eyes:     Pupils: Pupils are equal, round, and reactive to light.  Cardiovascular:     Rate and Rhythm: Regular rhythm.  Pulmonary:     Breath sounds: No wheezing or rhonchi.  Chest:     Chest wall: No tenderness.  Abdominal:     Tenderness: There is no abdominal tenderness.  Musculoskeletal:     Cervical back: Neck supple.     Right lower leg: No edema.     Left lower leg: No edema.  Skin:    General: Skin is warm.     Capillary Refill: Capillary refill takes less than 2 seconds.  Neurological:     Mental Status: She is alert and oriented to person, place, and time.    ED Results / Procedures / Treatments   Labs (all labs ordered are listed, but only abnormal results are displayed) Labs Reviewed  RESP PANEL BY RT-PCR (FLU A&B, COVID) ARPGX2 - Abnormal; Notable for the following components:      Result Value   Influenza A by PCR POSITIVE (*)    All other components within normal limits    EKG None  Radiology DG Chest Portable 1 View  Result Date: 03/12/2021 CLINICAL DATA:  Cough, headache, runny nose, and body aches. EXAM: PORTABLE CHEST 1 VIEW COMPARISON:  09/05/2020 FINDINGS: The cardiomediastinal silhouette is within normal limits.  Peribronchial thickening and accentuation of the interstitial markings may be partly chronic but appear increased compared to the prior study. No confluent airspace opacity, overt pulmonary edema, pleural effusion, or pneumothorax is identified. No acute osseous abnormality is seen. IMPRESSION: Peribronchial thickening suggesting bronchitis. Electronically Signed   By: Logan Bores M.D.   On: 03/12/2021 10:44    Procedures Procedures   Medications Ordered in ED Medications - No data to display  ED Course  I have reviewed the triage vital signs and the nursing notes.  Pertinent labs & imaging results that were available during my care of the patient were reviewed by me and considered in my medical decision making (see chart for details).    MDM Rules/Calculators/A&P                           Patient with cough URI symptoms.  Secondary symptoms.  X-ray reassuring and only showing bronchitis.  Flu positive.  Husband has similar symptoms.  Well-appearing not hypoxic.  High risk due to age.  We will treat with Tamiflu.  Symptomatic treatment for cough.  Discharge home Final Clinical Impression(s) / ED Diagnoses Final diagnoses:  Influenza    Rx / DC Orders ED Discharge Orders          Ordered    oseltamivir (TAMIFLU) 75 MG capsule  Every 12 hours        03/12/21 1142    guaiFENesin-dextromethorphan (ROBITUSSIN DM) 100-10 MG/5ML syrup  3 times daily PRN        03/12/21 1142             Davonna Belling, MD 03/12/21 1152

## 2021-03-12 NOTE — ED Notes (Signed)
S1 s2 heard pt denis pain

## 2021-03-13 ENCOUNTER — Encounter: Payer: Medicare Other | Admitting: Family Medicine

## 2021-03-13 NOTE — Telephone Encounter (Signed)
FYI Pt cancelled the appointment for this afternoon, pt was seen at the urgent care and tested positive for Flu

## 2021-03-23 ENCOUNTER — Encounter: Payer: Self-pay | Admitting: Family Medicine

## 2021-03-23 ENCOUNTER — Other Ambulatory Visit (INDEPENDENT_AMBULATORY_CARE_PROVIDER_SITE_OTHER): Payer: Medicare Other

## 2021-03-23 ENCOUNTER — Ambulatory Visit (INDEPENDENT_AMBULATORY_CARE_PROVIDER_SITE_OTHER): Payer: Medicare Other | Admitting: Family Medicine

## 2021-03-23 VITALS — BP 118/70 | HR 74 | Temp 97.9°F | Ht 65.0 in | Wt 177.4 lb

## 2021-03-23 DIAGNOSIS — I2510 Atherosclerotic heart disease of native coronary artery without angina pectoris: Secondary | ICD-10-CM | POA: Diagnosis not present

## 2021-03-23 DIAGNOSIS — I10 Essential (primary) hypertension: Secondary | ICD-10-CM

## 2021-03-23 DIAGNOSIS — R739 Hyperglycemia, unspecified: Secondary | ICD-10-CM | POA: Diagnosis not present

## 2021-03-23 DIAGNOSIS — E7800 Pure hypercholesterolemia, unspecified: Secondary | ICD-10-CM

## 2021-03-23 DIAGNOSIS — Z86010 Personal history of colonic polyps: Secondary | ICD-10-CM | POA: Diagnosis not present

## 2021-03-23 DIAGNOSIS — I350 Nonrheumatic aortic (valve) stenosis: Secondary | ICD-10-CM

## 2021-03-23 LAB — LIPID PANEL
Cholesterol: 121 mg/dL (ref 0–200)
HDL: 63.4 mg/dL (ref >39.00)
LDL Cholesterol: 36 mg/dL (ref 0–99)
NonHDL: 57.21
Total CHOL/HDL Ratio: 2
Triglycerides: 108 mg/dL (ref 0.0–149.0)
VLDL: 21.6 mg/dL (ref 0.0–40.0)

## 2021-03-23 LAB — CBC WITH DIFFERENTIAL/PLATELET
Basophils Absolute: 0 10*3/uL (ref 0.0–0.1)
Basophils Relative: 0.6 % (ref 0.0–3.0)
Eosinophils Absolute: 0.2 10*3/uL (ref 0.0–0.7)
Eosinophils Relative: 2.6 % (ref 0.0–5.0)
HCT: 44.7 % (ref 36.0–46.0)
Hemoglobin: 14.4 g/dL (ref 12.0–15.0)
Lymphocytes Relative: 26.3 % (ref 12.0–46.0)
Lymphs Abs: 2.2 10*3/uL (ref 0.7–4.0)
MCHC: 32.2 g/dL (ref 30.0–36.0)
MCV: 80.5 fl (ref 78.0–100.0)
Monocytes Absolute: 0.7 10*3/uL (ref 0.1–1.0)
Monocytes Relative: 8 % (ref 3.0–12.0)
Neutro Abs: 5.1 10*3/uL (ref 1.4–7.7)
Neutrophils Relative %: 62.5 % (ref 43.0–77.0)
Platelets: 250 10*3/uL (ref 150.0–400.0)
RBC: 5.56 Mil/uL — ABNORMAL HIGH (ref 3.87–5.11)
RDW: 14 % (ref 11.5–15.5)
WBC: 8.2 10*3/uL (ref 4.0–10.5)

## 2021-03-23 LAB — HEPATIC FUNCTION PANEL
ALT: 26 U/L (ref 0–35)
AST: 31 U/L (ref 0–37)
Albumin: 4.1 g/dL (ref 3.5–5.2)
Alkaline Phosphatase: 172 U/L — ABNORMAL HIGH (ref 39–117)
Bilirubin, Direct: 0.1 mg/dL (ref 0.0–0.3)
Total Bilirubin: 0.6 mg/dL (ref 0.2–1.2)
Total Protein: 6.9 g/dL (ref 6.0–8.3)

## 2021-03-23 LAB — BASIC METABOLIC PANEL
BUN: 14 mg/dL (ref 6–23)
CO2: 28 mEq/L (ref 19–32)
Calcium: 9.6 mg/dL (ref 8.4–10.5)
Chloride: 104 mEq/L (ref 96–112)
Creatinine, Ser: 0.94 mg/dL (ref 0.40–1.20)
GFR: 59.12 mL/min — ABNORMAL LOW (ref >60.00)
Glucose, Bld: 94 mg/dL (ref 70–99)
Potassium: 4.3 mEq/L (ref 3.5–5.1)
Sodium: 140 mEq/L (ref 135–145)

## 2021-03-23 LAB — TSH: TSH: 3.09 u[IU]/mL (ref 0.35–5.50)

## 2021-03-23 LAB — HEMOGLOBIN A1C: Hgb A1c MFr Bld: 5.9 % (ref 4.6–6.5)

## 2021-03-23 NOTE — Progress Notes (Signed)
Subjective:    Patient ID: Anne Martin, female    DOB: 06/14/1945, 75 y.o.   MRN: [id number]  HPI Here to follow up on issues . She feels well today but she [place] still coughing from time to time. Both she and her husband were seen in the ER on 03-12-21 for influenza. She fels better in most ways but she still has a dry cough. She has had no exertional chest pain or SOB. She had an ECHO on 03-09-21 showing an EF of 65-70% and mild to moderate AV stenosis (which is stable). Her BO has been stable.    Review of Systems  Constitutional: Negative.   HENT: Negative.    Eyes: Negative.   Respiratory:  Positive for cough.   Cardiovascular: Negative.   Gastrointestinal: Negative.   Genitourinary:  Negative for decreased urine volume, difficulty urinating, dyspareunia, dysuria, enuresis, flank pain, frequency, hematuria, pelvic pain and urgency.  Musculoskeletal: Negative.   Skin: Negative.   Neurological: Negative.  Negative for headaches.  Psychiatric/Behavioral: Negative.        Objective:   Physical Exam Constitutional:      General: She is not in acute distress.    Appearance: Normal appearance. She is well-developed.  HENT:     Head: Normocephalic and atraumatic.     Right Ear: External ear normal.     Left Ear: External ear normal.     Nose: Nose normal.     Mouth/Throat:     Pharynx: No oropharyngeal exudate.  Eyes:     General: No scleral icterus.    Conjunctiva/sclera: Conjunctivae normal.     Pupils: Pupils are equal, round, and reactive to light.  Neck:     Thyroid: No thyromegaly.     Vascular: No JVD.  Cardiovascular:     Rate and Rhythm: Normal rate and regular rhythm.     Heart sounds: Normal heart sounds. No murmur heard.   No friction rub. No gallop.  Pulmonary:     Effort: Pulmonary effort is normal. No respiratory distress.     Breath sounds: Normal breath sounds. No wheezing or rales.  Chest:     Chest wall: No tenderness.  Abdominal:     General: Bowel  sounds are normal. There is no distension.     Palpations: Abdomen is soft. There is no mass.     Tenderness: There is no abdominal tenderness. There is no guarding or rebound.  Musculoskeletal:        General: No tenderness. Normal range of motion.     Cervical back: Normal range of motion and neck supple.  Lymphadenopathy:     Cervical: No cervical adenopathy.  Skin:    General: Skin is warm and dry.     Findings: No erythema or rash.  Neurological:     Mental Status: She is alert and oriented to person, place, and time.     Cranial Nerves: No cranial nerve deficit.     Motor: No abnormal muscle tone.     Coordination: Coordination normal.     Deep Tendon Reflexes: Reflexes are normal and symmetric. Reflexes normal.  Psychiatric:        Behavior: Behavior normal.        Thought Content: Thought content normal.        Judgment: Judgment normal.          Assessment & Plan:  She is doing well  in general, although she is still recovering from the flu. Her HTN  and CAD are stable. Her AV stenosis is stable. She is past due for a colonoscopy so we will reach out to the GI office to contact her. She had fasting labs drawn this morning for lipids, etc. We spent a total of (33   ) minutes reviewing records and discussing these issues.  Alysia Penna, MD

## 2021-03-29 ENCOUNTER — Other Ambulatory Visit: Payer: Self-pay | Admitting: Cardiology

## 2021-05-25 ENCOUNTER — Telehealth: Payer: Self-pay | Admitting: Internal Medicine

## 2021-05-25 ENCOUNTER — Ambulatory Visit (INDEPENDENT_AMBULATORY_CARE_PROVIDER_SITE_OTHER): Payer: Medicare Other

## 2021-05-25 VITALS — Ht 65.0 in | Wt 165.0 lb

## 2021-05-25 DIAGNOSIS — Z0000 Encounter for general adult medical examination without abnormal findings: Secondary | ICD-10-CM

## 2021-05-25 NOTE — Telephone Encounter (Signed)
Hey Dr. Henrene Pastor,   Patient called in to schedule recall colon. Because she is now 69, should she be scheduled an OV first or schedule directly in [place]?  Thank you

## 2021-05-25 NOTE — Progress Notes (Signed)
I connected with Emry Tobin today by telephone and verified that I am speaking with the correct person using two identifiers. Location patient: home Location provider: work Persons participating in the virtual visit: Sura, Canul LPN.   I discussed the limitations, risks, security and privacy concerns of performing an evaluation and management service by telephone and the availability of in person appointments. I also discussed with the patient that there may be a patient responsible charge related to this service. The patient expressed understanding and verbally consented to this telephonic visit.    Interactive audio and video telecommunications were attempted between this provider and patient, however failed, due to patient having technical difficulties OR patient did not have access to video capability.  We continued and completed visit with audio only.     Vital signs may be patient reported or missing.  Subjective:   Anne Martin is a 76 y.o. female who presents for Medicare Annual (Subsequent) preventive examination.  Review of Systems     Cardiac Risk Factors include: advanced age (>17men, >72 women);dyslipidemia;hypertension     Objective:    Today's Vitals   05/25/21 0811  Weight: 165 lb (74.8 kg)  Height: 5\' 5"  (1.651 m)   Body mass index is 27.46 kg/m.  Advanced Directives 05/25/2021 03/12/2021 09/05/2020 05/22/2020 01/01/2020 03/29/2018 10/28/2016  Does Patient Have a Medical Advance Directive? Yes Yes Yes Yes Yes Yes Yes  Type of Paramedic of [place];Living will - - Talent;Living will [place];Living will Healthcare Power of [place];Living will  Does patient want to make changes to medical advance directive? - - - No - Patient declined No - Patient declined No - Patient declined No - Patient declined  Copy of [place] in Chart? No - copy  requested - - No - copy requested No - copy requested No - copy requested No - copy requested    Current Medications (verified) Outpatient Encounter Medications as of 05/25/2021  Medication Sig   albuterol (VENTOLIN HFA) 108 (90 Base) MCG/ACT inhaler INHALE 2 PUFFS INTO THE LUNGS EVERY 4 HOURS AS NEEDED FOR WHEEZING OR SHORTNESS OF BREATH.   aspirin 81 MG EC tablet Take 81 mg by mouth daily.   atorvastatin (LIPITOR) 80 MG tablet TAKE 1 TABLET BY MOUTH EVERY DAY AT 6PM   calcium carbonate (OSCAL) 1500 (600 Ca) MG TABS tablet Take 600 mg of elemental calcium by mouth daily.   cholecalciferol (VITAMIN D) 1000 UNITS tablet Take 1,000 Units by mouth every evening.    isosorbide mononitrate (IMDUR) 30 MG 24 hr tablet TAKE 1 TABLET BY MOUTH EVERY DAY   loratadine (CLARITIN) 10 MG tablet Take 10 mg by mouth daily.    metoprolol succinate (TOPROL-XL) 50 MG 24 hr tablet Take 1 tablet (50 mg total) by mouth daily. Take with or immediately following a meal.   nitroGLYCERIN (NITROSTAT) 0.4 MG SL tablet Place 1 tablet (0.4 mg total) under the tongue every 5 (five) minutes as needed. MAX 3 doses   Probiotic Product (PROBIOTIC PO) Take 1 tablet by mouth daily.    vitamin C (ASCORBIC ACID) 500 MG tablet Take 500 mg by mouth at bedtime.   YUVAFEM 10 MCG TABS vaginal tablet Place 10 mcg vaginally 2 (two) times a week.    No facility-administered encounter medications on file as of 05/25/2021.    Allergies (verified) Procaine and Procaine hcl   History: Past Medical History:  Diagnosis Date   Allergy    seasonal   Anemia    prior to hysterectomy, fibroids   Aortic insufficiency    a. 10/2015 Echo: mild to moderate AI.   Aortic stenosis    a. 10/2015 Echo: moderate AS.   Blood transfusion without reported diagnosis    with hysterectomy   CAD (coronary artery disease)    a. 08/2009 s/p cath after abnormal nuclear study; LM nl, LAD 60%, D2 90% (PCI/DES), LCX 50 -60%, RCA nl, nl EF;  b. 2012 MV: no  ischemia/infarct;  c. 08/2015 MV: no ischemia/infarct, EF 70%.   Complication of anesthesia    Diastolic dysfunction    a. 10/2015 Echo: EF 55-60%, no rwma, Gr1 DD, mod AS, mild to mod AI.   Essential hypertension    H/O: rheumatic fever    History of colonoscopy    Hyperlipidemia    Osteopenia    last DEXA 11-22-13    PONV (postoperative nausea and vomiting)    Past Surgical History:  Procedure Laterality Date   ANGIOPLASTY  08/2009   stents   AORTIC ARCH ANGIOGRAPHY N/A 03/29/2018   Procedure: AORTIC ARCH ANGIOGRAPHY;  Surgeon: Nelva Bush, MD;  Location: [facility];  Service: Cardiovascular;  Laterality: N/A;   BACK SURGERY     11/2020   CHOLECYSTECTOMY  02/2003   COLONOSCOPY  02/02/2016   per Dr. Henrene Pastor, adenomatous polyps, repeat in 5 yrs    INTRAVASCULAR PRESSURE WIRE/FFR STUDY N/A 03/29/2018   Procedure: INTRAVASCULAR PRESSURE WIRE/FFR STUDY;  Surgeon: Nelva Bush, MD;  Location: [facility];  Service: Cardiovascular;  Laterality: N/A;   KNEE ARTHROSCOPY     Rt. Knee   LEFT HEART CATH AND CORONARY ANGIOGRAPHY N/A 03/29/2018   Procedure: LEFT HEART CATH AND CORONARY ANGIOGRAPHY;  Surgeon: Nelva Bush, MD;  Location: [facility];  Service: Cardiovascular;  Laterality: N/A;   LUMBAR LAMINECTOMY/DECOMPRESSION MICRODISCECTOMY Left 01/03/2020   Procedure: Left L2-3 decompression disectomy, Insitu Fusion Lumbar 2-3;  Surgeon: Melina Schools, MD;  Location: [facility];  Service: Orthopedics;  Laterality: Left;   TONSILLECTOMY     TONSILLECTOMY     VAGINAL HYSTERECTOMY  1994   Family History  Problem Relation Age of Onset   Heart disease Mother    Heart disease Father        CABG   Heart disease Brother        2 stents   Ulcerative colitis Daughter    Colon cancer Neg Hx    Social History   Socioeconomic History   Marital status: Married    Spouse name: Not on file   Number of children: 2   Years of education: Not on file   Highest  education level: Not on file  Occupational History   Occupation: Retired Product manager: RETIRED  Tobacco Use   Smoking status: Former    Types: Cigarettes    Quit date: 04/12/1977    Years since quitting: 44.1   Smokeless tobacco: Never   Tobacco comments:    quit age 64  Vaping Use   Vaping Use: Never used  Substance and Sexual Activity   Alcohol use: Not Currently    Alcohol/week: 0.0 standard drinks    Comment: rarely   Drug use: No   Sexual activity: Not Currently  Other Topics Concern   Not on file  Social History Narrative   Not on file   Social Determinants of Radio broadcast assistant  Strain: Low Risk    Difficulty of Paying Living Expenses: Not hard at all  Food Insecurity: No Food Insecurity   Worried About Charity fundraiser in the Last Year: Never true   Ran Out of Food in the Last Year: Never true  Transportation Needs: No Transportation Needs   Lack of Transportation (Medical): No   Lack of Transportation (Non-Medical): No  Physical Activity: Sufficiently Active   Days of Exercise per Week: 5 days   Minutes of Exercise per Session: 50 min  Stress: No Stress Concern Present   Feeling of Stress : Not at all  Social Connections: Not on file    Tobacco Counseling Counseling given: Not Answered Tobacco comments: quit age 44   Clinical Intake:  Pre-visit preparation completed: Yes  Pain : No/denies pain     Nutritional Status: BMI 25 -29 Overweight Nutritional Risks: None Diabetes: No  How often do you need to have someone help you when you read instructions, pamphlets, or other written materials from your doctor or pharmacy?: 1 - Never What is the last grade level you completed in school?: masters degree  Diabetic? no  Interpreter Needed?: No  Information entered by :: NAllen LPN   Activities of Daily Living In your present state of health, do you have any difficulty performing the following activities: 05/25/2021  Hearing? N   Vision? N  Difficulty concentrating or making decisions? N  Walking or climbing stairs? N  Dressing or bathing? N  Doing errands, shopping? N  Preparing Food and eating ? N  Using the Toilet? N  In the past six months, have you accidently leaked urine? N  Do you have problems with loss of bowel control? N  Managing your Medications? N  Managing your Finances? N  Housekeeping or managing your Housekeeping? N  Some recent data might be hidden    Patient Care Team: Laurey Morale, MD as PCP - General Stanford Breed Denice Bors, MD as PCP - Cardiology (Cardiology) Melina Schools, MD as Consulting Physician (Orthopedic Surgery)  Indicate any recent Medical Services you may have received from other than [facility] providers in the past year (date may be approximate).     Assessment:   This is a routine wellness examination for [place].  Hearing/Vision screen Vision Screening - Comments:: Regular eye exams, [facility]  Dietary issues and exercise activities discussed: Current Exercise Habits: Home exercise routine, Type of exercise: walking, Time (Minutes): 45, Frequency (Times/Week): 5, Weekly Exercise (Minutes/Week): 225   Goals Addressed             This Visit's Progress    Patient Stated       05/25/2021, Wants to take off weight gained since back issues       Depression Screen PHQ 2/9 Scores 05/25/2021 03/23/2021 05/22/2020 02/14/2020 01/05/2018 11/16/2016 01/09/2015  PHQ - 2 Score 0 0 0 0 0 0 0  PHQ- 9 Score - 0 - 0 - - -    Fall Risk Fall Risk  05/25/2021 03/23/2021 01/30/2021 05/22/2020 01/05/2018  Falls in the past year? 0 0 0 0 No  Comment - - - - -  Number falls in past yr: - 0 0 0 -  Injury with Fall? - 0 0 0 -  Risk for fall due to : Medication side effect No Fall Risks No Fall Risks No Fall Risks -  Follow up Falls evaluation completed;Education provided;Falls prevention discussed - - Falls evaluation completed;Falls prevention discussed -  FALL RISK PREVENTION  PERTAINING TO THE HOME:  Any stairs in or around the home? Yes  If so, are there any without handrails? No  Home free of loose throw rugs in walkways, pet beds, electrical cords, etc? Yes  Adequate lighting in your home to reduce risk of falls? Yes   ASSISTIVE DEVICES UTILIZED TO PREVENT FALLS:  Life alert? No  Use of a cane, walker or w/c? No  Grab bars in the bathroom? Yes  Shower chair or bench in shower? Yes  Elevated toilet seat or a handicapped toilet? Yes   TIMED UP AND GO:  Was the test performed? No .      Cognitive Function:     6CIT Screen 05/25/2021  What Year? 0 points  What month? 0 points  What time? 0 points  Count back from 20 0 points  Months in reverse 0 points  Repeat phrase 0 points  Total Score 0    Immunizations Immunization History  Administered Date(s) Administered   Influenza Split 01/20/2011, 01/13/2012   Influenza Whole 01/19/2008, 01/21/2010   Influenza, High Dose Seasonal PF 01/05/2018, 02/04/2020   Influenza, Seasonal, Injecte, Preservative Fre 12/22/2018   Influenza-Unspecified 01/01/2014, 01/20/2015, 01/19/2016   PFIZER(Purple Top)SARS-COV-2 Vaccination 04/21/2019, 05/12/2019, 01/21/2020   Pneumococcal Conjugate-13 11/13/2015   Pneumococcal Polysaccharide-23 01/05/2018   Tdap 11/13/2015   Zoster Recombinat (Shingrix) 01/11/2019, 03/16/2019   Zoster, Live 08/16/2013    TDAP status: Up to date  Flu Vaccine status: Up to date  Pneumococcal vaccine status: Up to date  Covid-19 vaccine status: Completed vaccines  Qualifies for Shingles Vaccine? Yes   Zostavax completed Yes   Shingrix Completed?: Yes  Screening Tests Health Maintenance  Topic Date Due   Hepatitis C Screening  Never done   COLONOSCOPY (Pts 45-1yrs Insurance coverage will need to be confirmed)  02/01/2021   TETANUS/TDAP  11/12/2025   Pneumonia Vaccine 60+ Years old  Completed   INFLUENZA VACCINE  Completed   DEXA SCAN  Completed   COVID-19 Vaccine   Completed   Zoster Vaccines- Shingrix  Completed   HPV VACCINES  Aged Out    Health Maintenance  Health Maintenance Due  Topic Date Due   Hepatitis C Screening  Never done   COLONOSCOPY (Pts 45-63yrs Insurance coverage will need to be confirmed)  02/01/2021    Colorectal cancer screening: Type of screening: Colonoscopy. Completed 02/02/2016. Repeat every 5 years  Mammogram status: Completed 05/13/2021. Repeat every year  Bone Density status: Completed 10/03/2019.   Lung Cancer Screening: (Low Dose CT Chest recommended if Age 37-80 years, 30 pack-year currently smoking OR have quit w/in 15years.) does not qualify.   Lung Cancer Screening Referral: no  Additional Screening:  Hepatitis C Screening: does qualify;   Vision Screening: Recommended annual ophthalmology exams for early detection of glaucoma and other disorders of the eye. Is the patient up to date with their annual eye exam?  Yes  Who is the provider or what is the name of the office in which the patient attends annual eye exams? .[facility] If pt is not established with a provider, would they like to be referred to a provider to establish care? No .   Dental Screening: Recommended annual dental exams for proper oral hygiene  Community Resource Referral / Chronic Care Management: CRR required this visit?  No   CCM required this visit?  No      Plan:     I have personally reviewed and noted the following  in the patients chart:   Medical and social history Use of alcohol, tobacco or illicit drugs  Current medications and supplements including opioid prescriptions.  Functional ability and status Nutritional status Physical activity Advanced directives List of other physicians Hospitalizations, surgeries, and ER visits in previous 12 months Vitals Screenings to include cognitive, depression, and falls Referrals and appointments  In addition, I have reviewed and discussed with patient certain  preventive protocols, quality metrics, and best practice recommendations. A written personalized care plan for preventive services as well as general preventive health recommendations were provided to patient.     Kellie Simmering, LPN   08/15/[id number]   Nurse Notes: none  Due to this being a virtual visit, the after visit summary with patients personalized plan was offered to patient via mail or my-chart.  patient was mailed a copy of AVS.

## 2021-05-25 NOTE — Patient Instructions (Signed)
Anne Martin , Thank you for taking time to come for your Medicare Wellness Visit. I appreciate your ongoing commitment to your health goals. Please review the following plan we discussed and let me know if I can assist you in the future.   Screening recommendations/referrals: Colonoscopy: completed 02/02/2016, due now Mammogram: completed 05/13/2021, due 05/14/2022 Bone Density: completed  10/03/2019 Recommended yearly ophthalmology/optometry visit for glaucoma screening and checkup Recommended yearly dental visit for hygiene and checkup  Vaccinations: Influenza vaccine: completed 01/01/2021, due next flu season Pneumococcal vaccine: completed 01/05/2018 Tdap vaccine: completed 11/13/2015, due 11/12/2025 Shingles vaccine: completed   Covid-19: 01/01/2021, 01/21/2020, 05/12/2019, 04/21/2019  Advanced directives: Please bring a copy of your POA (Power of Attorney) and/or Living Will to your next appointment.   Conditions/risks identified: none  Next appointment: Follow up in one year for your annual wellness visit    Preventive Care 65 Years and Older, Female Preventive care refers to lifestyle choices and visits with your health care provider that can promote health and wellness. What does preventive care include? A yearly physical exam. This is also called an annual well check. Dental exams once or twice a year. Routine eye exams. Ask your health care provider how often you should have your eyes checked. Personal lifestyle choices, including: Daily care of your teeth and gums. Regular physical activity. Eating a healthy diet. Avoiding tobacco and drug use. Limiting alcohol use. Practicing safe sex. Taking low-dose aspirin every day. Taking vitamin and mineral supplements as recommended by your health care provider. What happens during an annual well check? The services and screenings done by your health care provider during your annual well check will depend on your age, overall health,  lifestyle risk factors, and family history of disease. Counseling  Your health care provider may ask you questions about your: Alcohol use. Tobacco use. Drug use. Emotional well-being. Home and relationship well-being. Sexual activity. Eating habits. History of falls. Memory and ability to understand (cognition). Work and work Statistician. Reproductive health. Screening  You may have the following tests or measurements: Height, weight, and BMI. Blood pressure. Lipid and cholesterol levels. These may be checked every 5 years, or more frequently if you are over 77 years old. Skin check. Lung cancer screening. You may have this screening every year starting at age 3 if you have a 30-pack-year history of smoking and currently smoke or have quit within the past 15 years. Fecal occult blood test (FOBT) of the stool. You may have this test every year starting at age 48. Flexible sigmoidoscopy or colonoscopy. You may have a sigmoidoscopy every 5 years or a colonoscopy every 10 years starting at age 74. Hepatitis C blood test. Hepatitis B blood test. Sexually transmitted disease (STD) testing. Diabetes screening. This is done by checking your blood sugar (glucose) after you have not eaten for a while (fasting). You may have this done every 1-3 years. Bone density scan. This is done to screen for osteoporosis. You may have this done starting at age 35. Mammogram. This may be done every 1-2 years. Talk to your health care provider about how often you should have regular mammograms. Talk with your health care provider about your test results, treatment options, and if necessary, the need for more tests. Vaccines  Your health care provider may recommend certain vaccines, such as: Influenza vaccine. This is recommended every year. Tetanus, diphtheria, and acellular pertussis (Tdap, Td) vaccine. You may need a Td booster every 10 years. Zoster vaccine. You may need this  after age  59. Pneumococcal 13-valent conjugate (PCV13) vaccine. One dose is recommended after age 48. Pneumococcal polysaccharide (PPSV23) vaccine. One dose is recommended after age 23. Talk to your health care provider about which screenings and vaccines you need and how often you need them. This information is not intended to replace advice given to you by your health care provider. Make sure you discuss any questions you have with your health care provider. Document Released: 04/25/2015 Document Revised: 12/17/2015 Document Reviewed: 01/28/2015 Elsevier Interactive Patient Education  2017 [place] Prevention in the Home Falls can cause injuries. They can happen to people of all ages. There are many things you can do to make your home safe and to help prevent falls. What can I do on the outside of my home? Regularly fix the edges of walkways and driveways and fix any cracks. Remove anything that might make you trip as you walk through a door, such as a raised step or threshold. Trim any bushes or trees on the path to your home. Use bright outdoor lighting. Clear any walking paths of anything that might make someone trip, such as rocks or tools. Regularly check to see if handrails are loose or broken. Make sure that both sides of any steps have handrails. Any raised decks and porches should have guardrails on the edges. Have any leaves, snow, or ice cleared regularly. Use sand or salt on walking paths during winter. Clean up any spills in your garage right away. This includes oil or grease spills. What can I do in the bathroom? Use night lights. Install grab bars by the toilet and in the tub and shower. Do not use towel bars as grab bars. Use non-skid mats or decals in the tub or shower. If you need to sit down in the shower, use a plastic, non-slip stool. Keep the floor dry. Clean up any water that spills on the floor as soon as it happens. Remove soap buildup in the tub or shower  regularly. Attach bath mats securely with double-sided non-slip rug tape. Do not have throw rugs and other things on the floor that can make you trip. What can I do in the bedroom? Use night lights. Make sure that you have a light by your bed that is easy to reach. Do not use any sheets or blankets that are too big for your bed. They should not hang down onto the floor. Have a firm chair that has side arms. You can use this for support while you get dressed. Do not have throw rugs and other things on the floor that can make you trip. What can I do in the kitchen? Clean up any spills right away. Avoid walking on wet floors. Keep items that you use a lot in easy-to-reach places. If you need to reach something above you, use a strong step stool that has a grab bar. Keep electrical cords out of the way. Do not use floor polish or wax that makes floors slippery. If you must use wax, use non-skid floor wax. Do not have throw rugs and other things on the floor that can make you trip. What can I do with my stairs? Do not leave any items on the stairs. Make sure that there are handrails on both sides of the stairs and use them. Fix handrails that are broken or loose. Make sure that handrails are as long as the stairways. Check any carpeting to make sure that it is firmly attached to the  stairs. Fix any carpet that is loose or worn. Avoid having throw rugs at the top or bottom of the stairs. If you do have throw rugs, attach them to the floor with carpet tape. Make sure that you have a light switch at the top of the stairs and the bottom of the stairs. If you do not have them, ask someone to add them for you. What else can I do to help prevent falls? Wear shoes that: Do not have high heels. Have rubber bottoms. Are comfortable and fit you well. Are closed at the toe. Do not wear sandals. If you use a stepladder: Make sure that it is fully opened. Do not climb a closed stepladder. Make sure that  both sides of the stepladder are locked into place. Ask someone to hold it for you, if possible. Clearly mark and make sure that you can see: Any grab bars or handrails. First and last steps. Where the edge of each step is. Use tools that help you move around (mobility aids) if they are needed. These include: Canes. Walkers. Scooters. Crutches. Turn on the lights when you go into a dark area. Replace any light bulbs as soon as they burn out. Set up your furniture so you have a clear path. Avoid moving your furniture around. If any of your floors are uneven, fix them. If there are any pets around you, be aware of where they are. Review your medicines with your doctor. Some medicines can make you feel dizzy. This can increase your chance of falling. Ask your doctor what other things that you can do to help prevent falls. This information is not intended to replace advice given to you by your health care provider. Make sure you discuss any questions you have with your health care provider. Document Released: 01/23/2009 Document Revised: 09/04/2015 Document Reviewed: 05/03/2014 Elsevier Interactive Patient Education  2017 [employer].

## 2021-05-26 NOTE — Telephone Encounter (Signed)
Reviewed. She does not need an office visit.   Recommend routine previsit and direct colonoscopy in [place] for history of colon polyps. Thanks Dr. Henrene Pastor

## 2021-05-26 NOTE — Telephone Encounter (Addendum)
Spoke with patient. States she will call back to schedule once she is in front of calendar

## 2021-06-11 ENCOUNTER — Encounter: Payer: Self-pay | Admitting: Internal Medicine

## 2021-06-16 ENCOUNTER — Encounter: Payer: Self-pay | Admitting: Family Medicine

## 2021-06-17 NOTE — Telephone Encounter (Signed)
I reviewed the 11-29-19 CT angiogram she had done of her chest. They saw several tiny nodules in both lungs but these had not changed at all from the first time they were seen. These were felt to be benign, and no follow up was needed  ?

## 2021-07-16 ENCOUNTER — Ambulatory Visit (AMBULATORY_SURGERY_CENTER): Payer: Medicare Other | Admitting: *Deleted

## 2021-07-16 VITALS — Ht 65.0 in | Wt 165.0 lb

## 2021-07-16 DIAGNOSIS — Z86010 Personal history of colonic polyps: Secondary | ICD-10-CM

## 2021-07-16 MED ORDER — PLENVU 140 G PO SOLR: 1.0000 | ORAL | 0 refills | Status: DC

## 2021-07-16 NOTE — Progress Notes (Signed)
No egg or soy allergy known to patient  ?issues known to pt with past sedation with any surgeries or procedures of PONV  ?Patient denies ever being told they had issues or difficulty with intubation  ?No FH of Malignant Hyperthermia ?Pt is not on diet pills ?Pt is not on  home 02  ?Pt is not on blood thinners  ?Pt denies issues with constipation currently- she saw Perry 2020 with issues , he told her to take Miralax PRN, doesnt need the Miralax now, soft regular bm's daily  ?No A fib or A flutter ? ?Pt states she bleeds and has mark from her puppy after taking prednisone for > 1 month - she is on ASA daily as well but this started after the prednisone  ? ?[place] medicare Coupon to pt in PV today , Code to Pharmacy and  NO PA's for preps discussed with pt In PV today  ?Discussed with pt there will be an out-of-pocket cost for prep and that varies from $0 to 70 +  dollars - pt verbalized understanding  ? ?Due to the COVID-19 pandemic we are asking patients to follow certain guidelines in PV and the [employer]   ?Pt aware of COVID protocols and [facility] guidelines  ? ?PV completed over the phone. Pt verified name, DOB, address and insurance during PV today.  ?Pt mailed instruction packet with copy of consent form to read and not return, and instructions.  ?Pt encouraged to call with questions or issues.  ?If pt has My chart, procedure instructions sent via My Chart  ? ?

## 2021-07-29 ENCOUNTER — Telehealth: Payer: Self-pay | Admitting: Internal Medicine

## 2021-07-29 NOTE — Telephone Encounter (Signed)
Inbound call from patient stating that she is scheduled to have a colonoscopy on 4/27 with Dr. Henrene Pastor. Patient is seeking advice if she starts today a 6 day dose of prednisone if that will affect anything for her procedure. Please advise.  ?

## 2021-07-29 NOTE — Telephone Encounter (Signed)
Spoke with pt and told her she is ok to continue Prednisone as prescribed ?

## 2021-07-31 NOTE — Progress Notes (Signed)
? ? ? ? ?HPI:FU CAD. Cardiac catheterization was performed in May of 2011. This revealed Normal LM, 60 LAD, 90 D2, 50-60 LCx, normal RCA, and nl LV function. Patient had PCI of the diagonal with DES at that time. Carotid Dopplers in Oct 2013 were normal. Cardiac catheterization December 2019 showed nonobstructive coronary disease with moderate mid LAD and moderate in-stent restenosis in second diagonal and no disease in the circumflex or right coronary artery. LV function was normal with normal LV filling pressures. The ascending aorta was mildly dilated. CTA August 2021 showed no evidence of thoracic aortic aneurysm.  Scattered pulmonary nodules unchanged and no follow-up recommended.  Monitor April 2022 showed sinus rhythm with occasional PAC, brief PAT and PVC.  Echocardiogram November 2022 showed normal LV function, mild left ventricular hypertrophy, moderate aortic insufficiency, mild to moderate aortic stenosis with mean gradient 15 mmHg.  Since I last saw her, she has dyspnea with more vigorous activities relieved with rest.  Her condition is not as good as previously due to recent back surgery/back pain.  There is no orthopnea, PND, pedal edema, chest pain or syncope. ? ?Current Outpatient Medications  ?Medication Sig Dispense Refill  ? albuterol (VENTOLIN HFA) 108 (90 Base) MCG/ACT inhaler INHALE 2 PUFFS INTO THE LUNGS EVERY 4 HOURS AS NEEDED FOR WHEEZING OR SHORTNESS OF BREATH. 18 each 3  ? amoxicillin (AMOXIL) 500 MG tablet     ? aspirin 81 MG EC tablet Take 81 mg by mouth daily.    ? atorvastatin (LIPITOR) 80 MG tablet TAKE 1 TABLET BY MOUTH EVERY DAY AT 6PM 90 tablet 3  ? calcium carbonate (OSCAL) 1500 (600 Ca) MG TABS tablet Take 600 mg of elemental calcium by mouth daily.    ? cholecalciferol (VITAMIN D) 1000 UNITS tablet Take 1,000 Units by mouth every evening.     ? isosorbide mononitrate (IMDUR) 30 MG 24 hr tablet TAKE 1 TABLET BY MOUTH EVERY DAY 90 tablet 1  ? loratadine (CLARITIN) 10 MG tablet  Take 10 mg by mouth daily.     ? metoprolol succinate (TOPROL-XL) 50 MG 24 hr tablet Take 1 tablet (50 mg total) by mouth daily. Take with or immediately following a meal. 90 tablet 3  ? nitroGLYCERIN (NITROSTAT) 0.4 MG SL tablet Place 1 tablet (0.4 mg total) under the tongue every 5 (five) minutes as needed. MAX 3 doses 25 tablet 3  ? PREDNISONE PO Take by mouth. Tapering dose for 6 days    ? Probiotic Product (PROBIOTIC PO) Take 1 tablet by mouth daily.     ? vitamin C (ASCORBIC ACID) 500 MG tablet Take 500 mg by mouth at bedtime.    ? YUVAFEM 10 MCG TABS vaginal tablet Place 10 mcg vaginally 2 (two) times a week.   1  ? ?No current facility-administered medications for this visit.  ? ? ? ?Past Medical History:  ?Diagnosis Date  ? Allergy   ? seasonal  ? Anemia   ? prior to hysterectomy, fibroids  ? Aortic insufficiency   ? a. 10/2015 Echo: mild to moderate AI.  ? Aortic stenosis   ? a. 10/2015 Echo: moderate AS.  ? Blood transfusion without reported diagnosis   ? with hysterectomy  ? CAD (coronary artery disease)   ? a. 08/2009 s/p cath after abnormal nuclear study; LM nl, LAD 60%, D2 90% (PCI/DES), LCX 50 -60%, RCA nl, nl EF;  b. 2012 MV: no ischemia/infarct;  c. 08/2015 MV: no ischemia/infarct, EF 70%.  ? Complication  of anesthesia   ? Diastolic dysfunction   ? a. 10/2015 Echo: EF 55-60%, no rwma, Gr1 DD, mod AS, mild to mod AI.  ? Essential hypertension   ? preventive meds due to heart  ? H/O: rheumatic fever   ? Heart murmur   ? History of colonoscopy   ? Hyperlipidemia   ? preventative med for heart  ? Osteopenia   ? last DEXA 11-22-13   ? PONV (postoperative nausea and vomiting)   ? ? ?Past Surgical History:  ?Procedure Laterality Date  ? ANGIOPLASTY  08/2009  ? stents  ? AORTIC ARCH ANGIOGRAPHY N/A 03/29/2018  ? Procedure: AORTIC ARCH ANGIOGRAPHY;  Surgeon: Nelva Bush, MD;  Location: [facility];  Service: Cardiovascular;  Laterality: N/A;  ? BACK SURGERY    ? 11/2020  ? CHOLECYSTECTOMY  02/2003  ?  COLONOSCOPY  02/02/2016  ? per Dr. Henrene Pastor, adenomatous polyps, repeat in 5 yrs   ? INTRAVASCULAR PRESSURE WIRE/FFR STUDY N/A 03/29/2018  ? Procedure: INTRAVASCULAR PRESSURE WIRE/FFR STUDY;  Surgeon: Nelva Bush, MD;  Location: [facility];  Service: Cardiovascular;  Laterality: N/A;  ? KNEE ARTHROSCOPY    ? Rt. Knee  ? LEFT HEART CATH AND CORONARY ANGIOGRAPHY N/A 03/29/2018  ? Procedure: LEFT HEART CATH AND CORONARY ANGIOGRAPHY;  Surgeon: Nelva Bush, MD;  Location: [facility];  Service: Cardiovascular;  Laterality: N/A;  ? LUMBAR LAMINECTOMY/DECOMPRESSION MICRODISCECTOMY Left 01/03/2020  ? Procedure: Left L2-3 decompression disectomy, Insitu Fusion Lumbar 2-3;  Surgeon: Melina Schools, MD;  Location: [facility];  Service: Orthopedics;  Laterality: Left;  ? POLYPECTOMY    ? TONSILLECTOMY    ? TONSILLECTOMY    ? VAGINAL HYSTERECTOMY  1994  ? ? ?Social History  ? ?Socioeconomic History  ? Marital status: Married  ?  Spouse name: Not on file  ? Number of children: 2  ? Years of education: Not on file  ? Highest education level: Not on file  ?Occupational History  ? Occupation: Retired Pharmacist, hospital  ?  Employer: RETIRED  ?Tobacco Use  ? Smoking status: Former  ?  Types: Cigarettes  ?  Quit date: 04/12/1977  ?  Years since quitting: 44.3  ? Smokeless tobacco: Never  ? Tobacco comments:  ?  quit age 26  ?Vaping Use  ? Vaping Use: Never used  ?Substance and Sexual Activity  ? Alcohol use: Not Currently  ?  Alcohol/week: 0.0 standard drinks  ?  Comment: rarely  ? Drug use: No  ? Sexual activity: Not Currently  ?Other Topics Concern  ? Not on file  ?Social History Narrative  ? Not on file  ? ?Social Determinants of Health  ? ?Financial Resource Strain: Low Risk   ? Difficulty of Paying Living Expenses: Not hard at all  ?Food Insecurity: No Food Insecurity  ? Worried About Charity fundraiser in the Last Year: Never true  ? Ran Out of Food in the Last Year: Never true  ?Transportation Needs: No Transportation  Needs  ? Lack of Transportation (Medical): No  ? Lack of Transportation (Non-Medical): No  ?Physical Activity: Sufficiently Active  ? Days of Exercise per Week: 5 days  ? Minutes of Exercise per Session: 50 min  ?Stress: No Stress Concern Present  ? Feeling of Stress : Not at all  ?Social Connections: Not on file  ?Intimate Partner Violence: Not on file  ? ? ?Family History  ?Problem Relation Age of Onset  ? Heart disease Mother   ?  Heart disease Father   ?     CABG  ? Heart disease Sister   ? Heart disease Brother   ?     2 stents  ? Heart disease Maternal Grandmother   ? Heart disease Maternal Grandfather   ? Heart disease Paternal Grandmother   ? Heart disease Paternal Grandfather   ? Ulcerative colitis Daughter   ? Colon cancer Neg Hx   ? Colon polyps Neg Hx   ? Esophageal cancer Neg Hx   ? Rectal cancer Neg Hx   ? Stomach cancer Neg Hx   ? ? ?ROS: no fevers or chills, productive cough, hemoptysis, dysphasia, odynophagia, melena, hematochezia, dysuria, hematuria, rash, seizure activity, orthopnea, PND, pedal edema, claudication. Remaining systems are negative. ? ?Physical Exam: ?Well-developed well-nourished in no acute distress.  ?Skin is warm and dry.  ?HEENT is normal.  ?Neck is supple.  ?Chest is clear to auscultation with normal expansion.  ?Cardiovascular exam is regular rate and rhythm.  2/6 systolic murmur left sternal border. ?Abdominal exam nontender or distended. No masses palpated. ?Extremities show no edema. ?neuro grossly intact ? ?A/P ? ?1 coronary artery disease-patient doing well with no chest pain.  Continue aspirin and statin.  Note LV function is normal. ? ?2 aortic stenosis/aortic insufficiency-we will plan to repeat echocardiogram November 2023.  She presently is asymptomatic.  She understands she will likely require aortic valve replacement in the future. ? ?3 hypertension-blood pressure controlled.  Continue present medications. ? ?4 hyperlipidemia-continue statin. ? ?5  palpitations-continue beta-blocker.  Symptoms are controlled. ? ?Kirk Ruths, MD ? ? ? ?

## 2021-08-06 ENCOUNTER — Ambulatory Visit (AMBULATORY_SURGERY_CENTER): Payer: Medicare Other | Admitting: Internal Medicine

## 2021-08-06 ENCOUNTER — Encounter: Payer: Self-pay | Admitting: Internal Medicine

## 2021-08-06 VITALS — BP 105/60 | HR 61 | Temp 96.4°F | Resp 11 | Ht 65.0 in | Wt 177.0 lb

## 2021-08-06 DIAGNOSIS — Z86010 Personal history of colonic polyps: Secondary | ICD-10-CM | POA: Diagnosis not present

## 2021-08-06 DIAGNOSIS — D122 Benign neoplasm of ascending colon: Secondary | ICD-10-CM

## 2021-08-06 HISTORY — PX: COLONOSCOPY: SHX174

## 2021-08-06 MED ORDER — SODIUM CHLORIDE 0.9 % IV SOLN: 500.0000 mL | INTRAVENOUS | Status: DC

## 2021-08-06 NOTE — Progress Notes (Signed)
HISTORY OF PRESENT ILLNESS: ? ?Anne Martin is a 76 y.o. female with a history of both adenomatous and sessile serrated polyps.  Previous examinations 2002, 2012, 2017.  Now for surveillance.  No active complaints ? ?REVIEW OF SYSTEMS: ? ?All non-GI ROS negative. ?Past Medical History:  ?Diagnosis Date  ? Allergy   ? seasonal  ? Anemia   ? prior to hysterectomy, fibroids  ? Aortic insufficiency   ? a. 10/2015 Echo: mild to moderate AI.  ? Aortic stenosis   ? a. 10/2015 Echo: moderate AS.  ? Blood transfusion without reported diagnosis   ? with hysterectomy  ? CAD (coronary artery disease)   ? a. 08/2009 s/p cath after abnormal nuclear study; LM nl, LAD 60%, D2 90% (PCI/DES), LCX 50 -60%, RCA nl, nl EF;  b. 2012 MV: no ischemia/infarct;  c. 08/2015 MV: no ischemia/infarct, EF 70%.  ? Complication of anesthesia   ? Diastolic dysfunction   ? a. 10/2015 Echo: EF 55-60%, no rwma, Gr1 DD, mod AS, mild to mod AI.  ? Essential hypertension   ? preventive meds due to heart  ? H/O: rheumatic fever   ? Heart murmur   ? History of colonoscopy   ? Hyperlipidemia   ? preventative med for heart  ? Osteopenia   ? last DEXA 11-22-13   ? PONV (postoperative nausea and vomiting)   ? ? ?Past Surgical History:  ?Procedure Laterality Date  ? ANGIOPLASTY  08/2009  ? stents  ? AORTIC ARCH ANGIOGRAPHY N/A 03/29/2018  ? Procedure: AORTIC ARCH ANGIOGRAPHY;  Surgeon: Nelva Bush, MD;  Location: [facility];  Service: Cardiovascular;  Laterality: N/A;  ? BACK SURGERY    ? 11/2020  ? CHOLECYSTECTOMY  02/2003  ? COLONOSCOPY  02/02/2016  ? per Dr. Henrene Pastor, adenomatous polyps, repeat in 5 yrs   ? INTRAVASCULAR PRESSURE WIRE/FFR STUDY N/A 03/29/2018  ? Procedure: INTRAVASCULAR PRESSURE WIRE/FFR STUDY;  Surgeon: Nelva Bush, MD;  Location: [facility];  Service: Cardiovascular;  Laterality: N/A;  ? KNEE ARTHROSCOPY    ? Rt. Knee  ? LEFT HEART CATH AND CORONARY ANGIOGRAPHY N/A 03/29/2018  ? Procedure: LEFT HEART CATH AND CORONARY  ANGIOGRAPHY;  Surgeon: Nelva Bush, MD;  Location: [facility];  Service: Cardiovascular;  Laterality: N/A;  ? LUMBAR LAMINECTOMY/DECOMPRESSION MICRODISCECTOMY Left 01/03/2020  ? Procedure: Left L2-3 decompression disectomy, Insitu Fusion Lumbar 2-3;  Surgeon: Melina Schools, MD;  Location: [facility];  Service: Orthopedics;  Laterality: Left;  ? POLYPECTOMY    ? TONSILLECTOMY    ? TONSILLECTOMY    ? VAGINAL HYSTERECTOMY  1994  ? ? ?Social History ?Anne Martin  reports that she quit smoking about 44 years ago. Her smoking use included cigarettes. She has never used smokeless tobacco. She reports that she does not currently use alcohol. She reports that she does not use drugs. ? ?family history includes Heart disease in her brother, father, maternal grandfather, maternal grandmother, mother, paternal grandfather, paternal grandmother, and sister; Ulcerative colitis in her daughter. ? ?Allergies  ?Allergen Reactions  ? Procaine Rash  ?  Novocain reaction as child  ? Procaine Hcl Other (See Comments)  ?  Unknown  ? ? ?  ? ?PHYSICAL EXAMINATION: ? ?Vital signs: BP 122/71   Pulse 64   Temp (!) 96.4 ?F (35.8 ?C)   Ht '5\' 5"'$  (1.651 m)   Wt 177 lb (80.3 kg)   LMP  (LMP Unknown)   SpO2 96%   BMI  29.45 kg/m?  ?General: Well-developed, well-nourished, no acute distress ?HEENT: Sclerae are anicteric, conjunctiva pink. Oral mucosa intact ?Lungs: Clear ?Heart: Regular ?Abdomen: soft, nontender, nondistended, no obvious ascites, no peritoneal signs, normal bowel sounds. No organomegaly. ?Extremities: No edema ?Psychiatric: alert and oriented x3. Cooperative  ? ? ? ?ASSESSMENT: ? ?History of sessile serrated and adenomatous polyps. ? ? ?PLAN: ? ? ?Surveillance colonoscopy ? ? ? ?  ?

## 2021-08-06 NOTE — Progress Notes (Signed)
Called to room to assist during endoscopic procedure.  Patient ID and intended procedure confirmed with present staff. Received instructions for my participation in the procedure from the performing physician.  

## 2021-08-06 NOTE — Op Note (Signed)
[place] ?Patient Name: Anne Martin ?Procedure Date: 08/06/2021 10:47 AM ?MRN: [id number] ?Endoscopist: Docia Chuck. Henrene Pastor , MD ?Age: 76 ?Referring MD:  ?Date of Birth: August 02, 1945 ?Gender: Female ?Account #: [id number] ?Procedure:                Colonoscopy with cold snare polypectomy x 1 ?Indications:              High risk colon cancer surveillance: Personal  ?                          history of non-advanced adenoma, High risk colon  ?                          cancer surveillance: Personal history of sessile  ?                          serrated colon polyp (less than 10 mm in size) with  ?                          no dysplasia. Previous examinations 2002, 2012, 2017 ?Medicines:                Monitored Anesthesia Care ?Procedure:                Pre-Anesthesia Assessment: ?                          - Prior to the procedure, a History and Physical  ?                          was performed, and patient medications and  ?                          allergies were reviewed. The patient's tolerance of  ?                          previous anesthesia was also reviewed. The risks  ?                          and benefits of the procedure and the sedation  ?                          options and risks were discussed with the patient.  ?                          All questions were answered, and informed consent  ?                          was obtained. Prior Anticoagulants: The patient has  ?                          taken no previous anticoagulant or antiplatelet  ?                          agents. ASA Grade Assessment: II - A patient with  ?  mild systemic disease. After reviewing the risks  ?                          and benefits, the patient was deemed in  ?                          satisfactory condition to undergo the procedure. ?                          After obtaining informed consent, the colonoscope  ?                          was passed under direct vision. Throughout the  ?                           procedure, the patient's blood pressure, pulse, and  ?                          oxygen saturations were monitored continuously. The  ?                          CF [id number] #[id number] was introduced through the anus  ?                          and advanced to the the cecum, identified by  ?                          appendiceal orifice and ileocecal valve. The  ?                          ileocecal valve, appendiceal orifice, and rectum  ?                          were photographed. The quality of the bowel  ?                          preparation was excellent. The colonoscopy was  ?                          performed without difficulty. The patient tolerated  ?                          the procedure well. The bowel preparation used was  ?                          Prepopik via split dose instruction. ?Scope In: 10:59:38 AM ?Scope Out: 11:16:32 AM ?Scope Withdrawal Time: 0 hours 14 minutes 7 seconds  ?Total Procedure Duration: 0 hours 16 minutes 54 seconds  ?Findings:                 A 1 mm polyp was found in the ascending colon. The  ?                          polyp was sessile. The polyp was  removed with a  ?                          cold snare. Resection was complete. No meaningful  ?                          tissue available for pathologic submission. ?                          The exam was otherwise without abnormality on  ?                          direct and retroflexion views. ?Complications:            No immediate complications. Estimated blood loss:  ?                          None. ?Estimated Blood Loss:     Estimated blood loss: none. ?Impression:               - One 1 mm polyp in the ascending colon, removed  ?                          with a cold snare. Resected. ?                          - The examination was otherwise normal on direct  ?                          and retroflexion views. ?Recommendation:           - Repeat colonoscopy is not recommended for  ?                           surveillance. ?                          - Patient has a contact number available for  ?                          emergencies. The signs and symptoms of potential  ?                          delayed complications were discussed with the  ?                          patient. Return to normal activities tomorrow.  ?                          Written discharge instructions were provided to the  ?                          patient. ?                          - Resume previous diet. ?                          -  Continue present medications. ?Docia Chuck. Henrene Pastor, MD ?08/06/2021 11:22:32 AM ?This report has been signed electronically. ?

## 2021-08-06 NOTE — Patient Instructions (Signed)
Discharge instructions given. Handout on polyps. Resume previous medications. YOU HAD AN ENDOSCOPIC PROCEDURE TODAY AT THE [facility]:   Refer to the procedure report that was given to you for any specific questions about what was found during the examination.  If the procedure report does not answer your questions, please call your gastroenterologist to clarify.  If you requested that your care partner not be given the details of your procedure findings, then the procedure report has been included in a sealed envelope for you to review at your convenience later.  YOU SHOULD EXPECT: Some feelings of bloating in the abdomen. Passage of more gas than usual.  Walking can help get rid of the air that was put into your GI tract during the procedure and reduce the bloating. If you had a lower endoscopy (such as a colonoscopy or flexible sigmoidoscopy) you may notice spotting of blood in your stool or on the toilet paper. If you underwent a bowel prep for your procedure, you may not have a normal bowel movement for a few days.  Please Note:  You might notice some irritation and congestion in your nose or some drainage.  This is from the oxygen used during your procedure.  There is no need for concern and it should clear up in a day or so.  SYMPTOMS TO REPORT IMMEDIATELY:  Following lower endoscopy (colonoscopy or flexible sigmoidoscopy):  Excessive amounts of blood in the stool  Significant tenderness or worsening of abdominal pains  Swelling of the abdomen that is new, acute  Fever of 100F or higher   For urgent or emergent issues, a gastroenterologist can be reached at any hour by calling [phone]. Do not use MyChart messaging for urgent concerns.    DIET:  We do recommend a small meal at first, but then you may proceed to your regular diet.  Drink plenty of fluids but you should avoid alcoholic beverages for 24 hours.  ACTIVITY:  You should plan to take it easy for the rest  of today and you should NOT DRIVE or use heavy machinery until tomorrow (because of the sedation medicines used during the test).    FOLLOW UP: Our staff will call the number listed on your records 48-72 hours following your procedure to check on you and address any questions or concerns that you may have regarding the information given to you following your procedure. If we do not reach you, we will leave a message.  We will attempt to reach you two times.  During this call, we will ask if you have developed any symptoms of COVID 19. If you develop any symptoms (ie: fever, flu-like symptoms, shortness of breath, cough etc.) before then, please call [phone].  If you test positive for Covid 19 in the 2 weeks post procedure, please call and report this information to us.    If any biopsies were taken you will be contacted by phone or by letter within the next 1-3 weeks.  Please call us at [phone] if you have not heard about the biopsies in 3 weeks.    SIGNATURES/CONFIDENTIALITY: You and/or your care partner have signed paperwork which will be entered into your electronic medical record.  These signatures attest to the fact that that the information above on your After Visit Summary has been reviewed and is understood.  Full responsibility of the confidentiality of this discharge information lies with you and/or your care-partner.  

## 2021-08-06 NOTE — Progress Notes (Signed)
Sedate, gd SR, tolerated procedure well, VSS, report to RN 

## 2021-08-07 ENCOUNTER — Ambulatory Visit (INDEPENDENT_AMBULATORY_CARE_PROVIDER_SITE_OTHER): Payer: Medicare Other | Admitting: Cardiology

## 2021-08-07 ENCOUNTER — Encounter: Payer: Self-pay | Admitting: Cardiology

## 2021-08-07 VITALS — BP 118/66 | HR 62 | Ht 65.0 in | Wt 174.0 lb

## 2021-08-07 DIAGNOSIS — I2510 Atherosclerotic heart disease of native coronary artery without angina pectoris: Secondary | ICD-10-CM | POA: Diagnosis not present

## 2021-08-07 DIAGNOSIS — I350 Nonrheumatic aortic (valve) stenosis: Secondary | ICD-10-CM | POA: Diagnosis not present

## 2021-08-07 DIAGNOSIS — E7800 Pure hypercholesterolemia, unspecified: Secondary | ICD-10-CM

## 2021-08-07 DIAGNOSIS — I10 Essential (primary) hypertension: Secondary | ICD-10-CM | POA: Diagnosis not present

## 2021-08-07 NOTE — Patient Instructions (Signed)

## 2021-08-10 ENCOUNTER — Telehealth: Payer: Self-pay

## 2021-08-10 ENCOUNTER — Telehealth: Payer: Self-pay | Admitting: Cardiology

## 2021-08-10 ENCOUNTER — Telehealth: Payer: Self-pay | Admitting: *Deleted

## 2021-08-10 NOTE — Telephone Encounter (Signed)
?  Follow up Call- ? ? ?  08/06/2021  ?  9:53 AM  ?Call back number  ?Post procedure Call Back phone  # [phone]  ?Permission to leave phone message Yes  ?  ? ?Patient questions: ? ?Do you have a fever, pain , or abdominal swelling? No. ?Pain Score  0 * ? ?Have you tolerated food without any problems? Yes.   ? ?Have you been able to return to your normal activities? Yes.   ? ?Do you have any questions about your discharge instructions: ?Diet   No. ?Medications  No. ?Follow up visit  No. ? ?Do you have questions or concerns about your Care? No. ? ?Actions: ?* If pain score is 4 or above: ?No action needed, pain <4. ? ? ?

## 2021-08-10 NOTE — Telephone Encounter (Signed)
Returned call to pt wife. All CABG questions answered ?

## 2021-08-10 NOTE — Telephone Encounter (Signed)
Left message on follow up call. 

## 2021-08-10 NOTE — Telephone Encounter (Signed)
Patient states she has couple of questions. Please advise  ?

## 2021-08-12 ENCOUNTER — Other Ambulatory Visit: Payer: Self-pay | Admitting: Cardiology

## 2021-08-13 ENCOUNTER — Other Ambulatory Visit: Payer: Self-pay | Admitting: Obstetrics

## 2021-08-13 DIAGNOSIS — M8580 Other specified disorders of bone density and structure, unspecified site: Secondary | ICD-10-CM

## 2021-08-20 ENCOUNTER — Encounter: Payer: Self-pay | Admitting: Cardiology

## 2021-08-20 NOTE — Telephone Encounter (Signed)
Patient reports having pressure around her upper body that comes and goes. She gets sob with exertion. She stated she believes the pressure is stress-related because her husband is having heart surgery soon. She stated her blood pressure has been fine. We discussed how to use NTG tablets and when to go to the ED. ?

## 2021-08-28 ENCOUNTER — Other Ambulatory Visit: Payer: Self-pay

## 2021-08-28 ENCOUNTER — Emergency Department (HOSPITAL_COMMUNITY): Payer: Medicare Other

## 2021-08-28 ENCOUNTER — Emergency Department (HOSPITAL_COMMUNITY)
Admission: EM | Admit: 2021-08-28 | Discharge: 2021-08-29 | Disposition: A | Discharge status: Home / Self Care | Payer: Medicare Other | Attending: Emergency Medicine | Admitting: Emergency Medicine

## 2021-08-28 ENCOUNTER — Encounter (HOSPITAL_COMMUNITY): Payer: Self-pay | Admitting: Emergency Medicine

## 2021-08-28 DIAGNOSIS — I2510 Atherosclerotic heart disease of native coronary artery without angina pectoris: Secondary | ICD-10-CM | POA: Diagnosis not present

## 2021-08-28 DIAGNOSIS — R079 Chest pain, unspecified: Secondary | ICD-10-CM

## 2021-08-28 LAB — CBC
HCT: 43.2 % (ref 36.0–46.0)
Hemoglobin: 13.9 g/dL (ref 12.0–15.0)
MCH: 26.2 pg (ref 26.0–34.0)
MCHC: 32.2 g/dL (ref 30.0–36.0)
MCV: 81.5 fL (ref 80.0–100.0)
Platelets: 239 10*3/uL (ref 150–400)
RBC: 5.3 MIL/uL — ABNORMAL HIGH (ref 3.87–5.11)
RDW: 14.6 % (ref 11.5–15.5)
WBC: 8.4 10*3/uL (ref 4.0–10.5)
nRBC: 0 % (ref 0.0–0.2)

## 2021-08-28 LAB — BASIC METABOLIC PANEL
Anion gap: 8 (ref 5–15)
BUN: 14 mg/dL (ref 8–23)
CO2: 26 mmol/L (ref 22–32)
Calcium: 9.5 mg/dL (ref 8.9–10.3)
Chloride: 104 mmol/L (ref 98–111)
Creatinine, Ser: 0.9 mg/dL (ref 0.44–1.00)
GFR, Estimated: >60 mL/min (ref >60)
Glucose, Bld: 102 mg/dL — ABNORMAL HIGH (ref 70–99)
Potassium: 3.9 mmol/L (ref 3.5–5.1)
Sodium: 138 mmol/L (ref 135–145)

## 2021-08-28 LAB — TROPONIN I (HIGH SENSITIVITY): Troponin I (High Sensitivity): 5 ng/L (ref <18)

## 2021-08-28 NOTE — ED Provider Triage Note (Signed)
  Emergency Medicine Provider Triage Evaluation Note  MRN:  [id number]  Arrival date & time: 08/28/21    Medically screening exam initiated at 10:11 PM.   CC:   Chest Pain   HPI:  Anne Martin is a 76 y.o. year-old female presents to the ED with chief complaint of intermittent chest pain x 2 weeks.  She thinks it might be anxiety, but she does have CAD with a stent.  Pain is central and sometimes radiates to her back.  It lasted most of the day today, but she's not having pain now.  She sees Dr. Stanford Breed.  History provided by History provided by patient. ROS:  -As included in HPI PE:   Vitals:   08/28/21 2107  BP: (!) 158/70  Pulse: 70  Resp: 16  Temp: 98.2 F (36.8 C)  SpO2: 97%    Non-toxic appearing No respiratory distress  MDM:  Based on signs and symptoms, ACS is highest on my differential, followed by anxiety. I've ordered labs in triage to expedite lab/diagnostic workup.  Patient was informed that the remainder of the evaluation will be completed by another provider, this initial triage assessment does not replace that evaluation, and the importance of remaining in the ED until their evaluation is complete.    Montine Circle, PA-C 08/28/21 2212

## 2021-08-28 NOTE — ED Triage Notes (Signed)
Patient reports intermittent pain across her chest radiating to upper back and neck for several days with SOB , no emesis or diaphoresis . Her cardiologist is Dr. Stanford Breed. History of CAD / coronary stent.

## 2021-08-29 DIAGNOSIS — R079 Chest pain, unspecified: Secondary | ICD-10-CM | POA: Diagnosis not present

## 2021-08-29 LAB — TROPONIN I (HIGH SENSITIVITY): Troponin I (High Sensitivity): 4 ng/L (ref <18)

## 2021-08-29 NOTE — ED Provider Notes (Signed)
[facility] Emergency Department Provider Note MRN:  [id number]  Arrival date & time: 08/29/21     Chief Complaint   Chest Pain   History of Present Illness   Anne Martin is a 76 y.o. year-old female presents to the ED with chief complaint of of intermittent chest pain x 2 weeks.  She thinks it might be anxiety, but she does have CAD with a stent.  Pain is central and sometimes radiates to her back.  It lasted most of the day today, but she's not having pain now.  She sees Dr. Stanford Breed..  History provided by patient.   Review of Systems  Pertinent review of systems noted in HPI.    Physical Exam   Vitals:   08/28/21 2340 08/29/21 0117  BP: (!) 156/78 (!) 164/74  Pulse: 65 61  Resp: 17 17  Temp: 98.5 F (36.9 C)   SpO2: 96% 98%    CONSTITUTIONAL:  well-appearing, NAD NEURO:  Alert and oriented x 3, CN 3-12 grossly intact EYES:  eyes equal and reactive ENT/NECK:  Supple, no stridor  CARDIO:  normal rate, regular rhythm, appears well-perfused  PULM:  No respiratory distress, CTAB GI/GU:  non-distended,  MSK/SPINE:  No gross deformities, no edema, moves all extremities  SKIN:  no rash, atraumatic   *Additional and/or pertinent findings included in MDM below  Diagnostic and Interventional Summary    EKG Interpretation  Date/Time:    Ventricular Rate:    PR Interval:    QRS Duration:   QT Interval:    QTC Calculation:   R Axis:     Text Interpretation:          Labs Reviewed  BASIC METABOLIC PANEL - Abnormal; Notable for the following components:      Result Value   Glucose, Bld 102 (*)    All other components within normal limits  CBC - Abnormal; Notable for the following components:   RBC 5.30 (*)    All other components within normal limits  TROPONIN I (HIGH SENSITIVITY)  TROPONIN I (HIGH SENSITIVITY)    DG Chest 2 View  Final Result      Medications - No data to display   Procedures  /  Critical  Care Procedures  ED Course and Medical Decision Making  I have reviewed the triage vital signs, the nursing notes, and pertinent available records from the EMR.  Social Determinants Affecting Complexity of Care: Patient has no clinically significant social determinants affecting this chief complaint..   ED Course:   Patient here with chest pain.  Top differential diagnoses include ACS, followed by anxiety. Medical Decision Making Patient here with chest pain.  Symptoms have been intermittent for the past week or 2.  Husband had recent open heart surgery and is in the hospital.  Patient thinks her symptoms may be anxiety or stress, but she does have history of CAD.  Labs are reassuring.  EKG is nonischemic.  Symptoms have resolved.  I had a long discussion with the patient regarding admission versus discharge.  Heart score is 5.  Patient feels comfortable with discharge, and wishes to be discharged.  Referral placed for outpatient cardiology.  Return precautions discussed.  Problems Addressed: Chest pain, unspecified type: acute illness or injury that poses a threat to life or bodily functions  Amount and/or Complexity of Data Reviewed Labs: ordered.    Details: Initial trop is 4, repeat is 5.  Doubt ACS at this point. Radiology: ordered and  independent interpretation performed.    Details: CXR negative for opacity ECG/medicine tests: ordered and independent interpretation performed.    Details: Bradycardia, no ischemic changes     Consultants: No consultations were needed in caring for this patient.   Treatment and Plan: I considered admission due to patient's initial presentation, but after considering the examination and diagnostic results, patient will not require admission and can be discharged with outpatient follow-up.    Final Clinical Impressions(s) / ED Diagnoses     ICD-10-CM   1. Chest pain, unspecified type  R07.9 Ambulatory referral to Cardiology      ED  Discharge Orders          Ordered    Ambulatory referral to Cardiology       Comments: If you have not heard from the Cardiology office within the next 72 hours please call [phone].   08/29/21 0125              Discharge Instructions Discussed with and Provided to Patient:   Discharge Instructions   None      Montine Circle, PA-C 08/29/21 0157    Palumbo, April, MD 08/29/21 [phone]

## 2021-08-30 ENCOUNTER — Encounter: Payer: Self-pay | Admitting: Cardiology

## 2021-09-03 ENCOUNTER — Encounter: Payer: Self-pay | Admitting: Physician Assistant

## 2021-09-03 ENCOUNTER — Ambulatory Visit (INDEPENDENT_AMBULATORY_CARE_PROVIDER_SITE_OTHER): Payer: Medicare Other | Admitting: Physician Assistant

## 2021-09-03 VITALS — BP 124/68 | HR 65 | Ht 65.0 in | Wt 172.2 lb

## 2021-09-03 DIAGNOSIS — I10 Essential (primary) hypertension: Secondary | ICD-10-CM

## 2021-09-03 DIAGNOSIS — I25119 Atherosclerotic heart disease of native coronary artery with unspecified angina pectoris: Secondary | ICD-10-CM | POA: Diagnosis not present

## 2021-09-03 DIAGNOSIS — E785 Hyperlipidemia, unspecified: Secondary | ICD-10-CM

## 2021-09-03 DIAGNOSIS — R079 Chest pain, unspecified: Secondary | ICD-10-CM

## 2021-09-03 DIAGNOSIS — R072 Precordial pain: Secondary | ICD-10-CM | POA: Diagnosis not present

## 2021-09-03 NOTE — Progress Notes (Signed)
Cardiology Office Note:    Date:  09/05/2021   ID:  Anne Martin, DOB [birth year], MRN [id number]  PCP:  Laurey Morale, MD   [facility] Providers Cardiologist:  Kirk Ruths, MD     Referring MD: Laurey Morale, MD   Chief Complaint  Patient presents with   Follow-up    Seen for Dr. Stanford Breed    History of Present Illness:    Anne Martin is a 76 y.o. female with a hx of CAD, aortic insufficiency/aortic stenosis, hyperlipidemia, history of rheumatic fever, and history of hypertension.  Cardiac catheterization performed in May 2011 revealed normal left main, 60% LAD, 90% D2, 50 to 60% left circumflex lesion, normal RCA, normal EF.  Patient underwent PCI of diagonal with DES.  Carotid Doppler in October 2013 were normal.  Cardiac catheterization in December 2019 showed nonobstructive disease with moderate mid LAD lesion, moderate in-stent restenosis in second diagonal, no disease in the left circumflex or RCA.  Ascending aorta was mildly dilated.  A CTA in August 2021 showed no evidence of thoracic aortic aneurysm.  Heart monitor in April 2022 showed sinus rhythm with occasional PACs, brief PAT and a PVC.  Echocardiogram in November 2022 showed normal EF, mild LVH, moderate AI, mild to moderate aortic stenosis.  Patient was last seen by Dr. Stanford Breed on 07/30/2021 at which time she had dyspnea with more vigorous activity relieved by rest.  She message cardiology service on 5/11 with complaint of intermittent chest discomfort.  She ultimately was seen in the emergency room on 08/29/2021 with chest pain.  Troponin was negative x2.  According to ED physician's note, patient felt her chest pain might be related to anxiety related to her husband's heart surgery.  Patient presents today for evaluation of chest pain, I previously saw her husband prior to his heart surgery as well.  Anne Martin chest discomfort appears to be quite atypical as it lasted several hours however troponin remained  negative.  Since the ED visit, she no longer has any chest discomfort.  I discussed her case with Dr. Stanford Breed, given her previous cardiac history, I would recommend a PET stress test.  Risk and benefit of the procedure has been discussed with the patient who is agreeable to proceed.  Past Medical History:  Diagnosis Date   Allergy    seasonal   Anemia    prior to hysterectomy, fibroids   Aortic insufficiency    a. 10/2015 Echo: mild to moderate AI.   Aortic stenosis    a. 10/2015 Echo: moderate AS.   Blood transfusion without reported diagnosis    with hysterectomy   CAD (coronary artery disease)    a. 08/2009 s/p cath after abnormal nuclear study; LM nl, LAD 60%, D2 90% (PCI/DES), LCX 50 -60%, RCA nl, nl EF;  b. 2012 MV: no ischemia/infarct;  c. 08/2015 MV: no ischemia/infarct, EF 70%.   Complication of anesthesia    Diastolic dysfunction    a. 10/2015 Echo: EF 55-60%, no rwma, Gr1 DD, mod AS, mild to mod AI.   Essential hypertension    preventive meds due to heart   H/O: rheumatic fever    Heart murmur    History of colonoscopy    Hyperlipidemia    preventative med for heart   Osteopenia    last DEXA 11-22-13    PONV (postoperative nausea and vomiting)     Past Surgical History:  Procedure Laterality Date   ANGIOPLASTY  08/2009   stents  AORTIC ARCH ANGIOGRAPHY N/A 03/29/2018   Procedure: AORTIC ARCH ANGIOGRAPHY;  Surgeon: Nelva Bush, MD;  Location: [facility];  Service: Cardiovascular;  Laterality: N/A;   BACK SURGERY     11/2020   CHOLECYSTECTOMY  02/2003   COLONOSCOPY  02/02/2016   per Dr. Henrene Pastor, adenomatous polyps, repeat in 5 yrs    INTRAVASCULAR PRESSURE WIRE/FFR STUDY N/A 03/29/2018   Procedure: INTRAVASCULAR PRESSURE WIRE/FFR STUDY;  Surgeon: Nelva Bush, MD;  Location: [facility];  Service: Cardiovascular;  Laterality: N/A;   KNEE ARTHROSCOPY     Rt. Knee   LEFT HEART CATH AND CORONARY ANGIOGRAPHY N/A 03/29/2018   Procedure: LEFT HEART  CATH AND CORONARY ANGIOGRAPHY;  Surgeon: Nelva Bush, MD;  Location: [facility];  Service: Cardiovascular;  Laterality: N/A;   LUMBAR LAMINECTOMY/DECOMPRESSION MICRODISCECTOMY Left 01/03/2020   Procedure: Left L2-3 decompression disectomy, Insitu Fusion Lumbar 2-3;  Surgeon: Melina Schools, MD;  Location: [place];  Service: Orthopedics;  Laterality: Left;   POLYPECTOMY     TONSILLECTOMY     TONSILLECTOMY     VAGINAL HYSTERECTOMY  1994    Current Medications: Current Meds  Medication Sig   albuterol (VENTOLIN HFA) 108 (90 Base) MCG/ACT inhaler INHALE 2 PUFFS INTO THE LUNGS EVERY 4 HOURS AS NEEDED FOR WHEEZING OR SHORTNESS OF BREATH.   aspirin 81 MG EC tablet Take 81 mg by mouth daily.   atorvastatin (LIPITOR) 80 MG tablet TAKE 1 TABLET BY MOUTH EVERY DAY AT 6PM   calcium carbonate (OSCAL) 1500 (600 Ca) MG TABS tablet Take 600 mg of elemental calcium by mouth daily.   cholecalciferol (VITAMIN D) 1000 UNITS tablet Take 1,000 Units by mouth every evening.    isosorbide mononitrate (IMDUR) 30 MG 24 hr tablet TAKE 1 TABLET BY MOUTH EVERY DAY   loratadine (CLARITIN) 10 MG tablet Take 10 mg by mouth daily.    metoprolol succinate (TOPROL-XL) 50 MG 24 hr tablet Take 1 tablet (50 mg total) by mouth daily. Take with or immediately following a meal.   nitroGLYCERIN (NITROSTAT) 0.4 MG SL tablet Place 1 tablet (0.4 mg total) under the tongue every 5 (five) minutes as needed. MAX 3 doses   PREDNISONE PO Take by mouth. Tapering dose for 6 days   Probiotic Product (PROBIOTIC PO) Take 1 tablet by mouth daily.    vitamin C (ASCORBIC ACID) 500 MG tablet Take 500 mg by mouth at bedtime.   YUVAFEM 10 MCG TABS vaginal tablet Place 10 mcg vaginally 2 (two) times a week.      Allergies:   Procaine and Procaine hcl   Social History   Socioeconomic History   Marital status: Married    Spouse name: Not on file   Number of children: 2   Years of education: Not on file   Highest education level: Not  on file  Occupational History   Occupation: Retired Product manager: RETIRED  Tobacco Use   Smoking status: Former    Types: Cigarettes    Quit date: 04/12/1977    Years since quitting: 44.4   Smokeless tobacco: Never   Tobacco comments:    quit age 81  Vaping Use   Vaping Use: Never used  Substance and Sexual Activity   Alcohol use: Not Currently    Alcohol/week: 0.0 standard drinks    Comment: rarely   Drug use: No   Sexual activity: Not Currently  Other Topics Concern   Not on file  Social History  Narrative   Not on file   Social Determinants of Health   Financial Resource Strain: Low Risk    Difficulty of Paying Living Expenses: Not hard at all  Food Insecurity: No Food Insecurity   Worried About Charity fundraiser in the Last Year: Never true   [place] in the Last Year: Never true  Transportation Needs: No Transportation Needs   Lack of Transportation (Medical): No   Lack of Transportation (Non-Medical): No  Physical Activity: Sufficiently Active   Days of Exercise per Week: 5 days   Minutes of Exercise per Session: 50 min  Stress: No Stress Concern Present   Feeling of Stress : Not at all  Social Connections: Not on file     Family History: The patient's family history includes Heart disease in her brother, father, maternal grandfather, maternal grandmother, mother, paternal grandfather, paternal grandmother, and sister; Ulcerative colitis in her daughter. There is no history of Colon cancer, Colon polyps, Esophageal cancer, Rectal cancer, or Stomach cancer.  ROS:   Please see the history of present illness.     All other systems reviewed and are negative.  EKGs/Labs/Other Studies Reviewed:    The following studies were reviewed today:  Cath 03/29/2018 Conclusions: Non-obstructive coronary artery disease with moderate mid LAD disease (DFR 0.93) and moderate D2 in-stent restenosis (DFR 0.93).  No significant disease involving the LCx and  RCA. Normal left ventricular systolic function and left ventricular filling pressure. Mild aortic stenosis and regurgitation. Mildly dilated ascending aorta.   Recommendations: Medical therapy and secondary prevention of coronary artery disease. Consider cross-sectional imaging of aorta.     Echo 03/09/2021  1. Left ventricular ejection fraction, by estimation, is 65 to 70%. The  left ventricle has normal function. The left ventricle has no regional  wall motion abnormalities. There is mild left ventricular hypertrophy.  Left ventricular diastolic parameters  are indeterminate.   2. Right ventricular systolic function is normal. The right ventricular  size is normal. There is normal pulmonary artery systolic pressure. The  estimated right ventricular systolic pressure is 37.1 mmHg.   3. The mitral valve is normal in structure. Trivial mitral valve  regurgitation. No evidence of mitral stenosis.   4. The aortic valve is tricuspid. Aortic valve regurgitation is moderate.  Mild to moderate aortic valve stenosis. Vmax 2.6 m/s, MG 22mHg, AVA  1.0cm^2, DI 0.46   5. The inferior vena cava is normal in size with greater than 50%  respiratory variability, suggesting right atrial pressure of 3 mmHg.     EKG:  EKG is ordered today.  The ekg ordered today demonstrates normal sinus rhythm with PACs.  No significant ST-T wave changes.  Recent Labs: 03/23/2021: ALT 26; TSH 3.09 08/28/2021: BUN 14; Creatinine, Ser 0.90; Hemoglobin 13.9; Platelets 239; Potassium 3.9; Sodium 138  Recent Lipid Panel    Component Value Date/Time   CHOL 121 03/23/2021 0913   CHOL 119 02/02/2021 0849   TRIG 108.0 03/23/2021 0913   HDL 63.40 03/23/2021 0913   HDL 66 02/02/2021 0849   CHOLHDL 2 03/23/2021 0913   VLDL 21.6 03/23/2021 0913   LDLCALC 36 03/23/2021 0913   LDLCALC 39 02/02/2021 0849   LDLCALC 46 02/14/2020 1008     Risk Assessment/Calculations:           Physical Exam:    VS:  BP 124/68    Pulse 65   Ht '5\' 5"'$  (1.651 m)   Wt 172 lb 3.2 oz (78.1  kg)   LMP  (LMP Unknown)   SpO2 98%   BMI 28.66 kg/m     Wt Readings from Last 3 Encounters:  09/03/21 172 lb 3.2 oz (78.1 kg)  08/28/21 176 lb 5.9 oz (80 kg)  08/07/21 174 lb (78.9 kg)     GEN:  Well nourished, well developed in no acute distress HEENT: Normal NECK: No JVD; No carotid bruits LYMPHATICS: No lymphadenopathy CARDIAC: RRR, no murmurs, rubs, gallops RESPIRATORY:  Clear to auscultation without rales, wheezing or rhonchi  ABDOMEN: Soft, non-tender, non-distended MUSCULOSKELETAL:  No edema; No deformity  SKIN: Warm and dry NEUROLOGIC:  Alert and oriented x 3 PSYCHIATRIC:  Normal affect   ASSESSMENT:    1. Precordial pain   2. Coronary artery disease involving native coronary artery of native heart with angina pectoris ([place])   3. Hyperlipidemia LDL goal <70   4. Essential hypertension    PLAN:    In order of problems listed above:  Precordial chest pain: She had chest pain a week ago that lasted at least several hours.  Symptoms did not worsen with physical exertion.  Serial troponin obtained in the ED was negative x2 despite prolonged chest discomfort.  I recommend a PET stress test given her prior cardiac history.  Since she had false positive SPECT stress test in December 2019 prior to last cardiac catheterization, I think PET stress test would be a better alternative  CAD: Denies any further chest discomfort since the ED visit.  On aspirin and Lipitor  Hyperlipidemia: On Lipitor  Hypertension: Blood pressure stable      Shared Decision Making/Informed Consent The risks [chest pain, shortness of breath, cardiac arrhythmias, dizziness, blood pressure fluctuations, myocardial infarction, stroke/transient ischemic attack, nausea, vomiting, allergic reaction, radiation exposure, metallic taste sensation and life-threatening complications (estimated to be 1 in 10,000)], benefits (risk stratification,  diagnosing coronary artery disease, treatment guidance) and alternatives of a cardiac PET stress test were discussed in detail with Anne Martin and she agrees to proceed.    Medication Adjustments/Labs and Tests Ordered: Current medicines are reviewed at length with the patient today.  Concerns regarding medicines are outlined above.  Orders Placed This Encounter  Procedures   NM PET CT CARDIAC PERFUSION MULTI W/ABSOLUTE BLOODFLOW   EKG 12-Lead   No orders of the defined types were placed in this encounter.   Patient Instructions  Medication Instructions:  Your physician recommends that you continue on your current medications as directed. Please refer to the Current Medication list given to you today.  *If you need a refill on your cardiac medications before your next appointment, please call your pharmacy*  Lab Work: NONE ordered at this time of appointment   If you have labs (blood work) drawn today and your tests are completely normal, you will receive your results only by: [place] (if you have MyChart) OR A paper copy in the mail If you have any lab test that is abnormal or we need to change your treatment, we will call you to review the results.  Testing/Procedures: CARDIAC PET- Your physician has requested that you have a Cardiac Pet Stress Test. This testing is completed at [facility] ([place], [place] [address]). The schedulers will call you to get this scheduled. Please follow instructions below and call the office with any questions/concerns [phone]).  Follow-Up: At [facility], you and your health needs are our priority.  As part of our continuing mission to provide you with  exceptional heart care, we have created designated Provider Care Teams.  These Care Teams include your primary Cardiologist (physician) and Advanced Practice Providers (APPs -  Physician Assistants and Nurse Practitioners) who all work together to  provide you with the care you need, when you need it.   Your next appointment:   3-4 month(s)  The format for your next appointment:   In Person  Provider:   Kirk Ruths, MD     Other Instructions  How to Prepare for Your Cardiac PET/CT Stress Test:  1. Please do not take these medications before your test:   Medications that may interfere with the cardiac pharmacological stress agent (ex. nitrates or beta-blockers) the day of the exam. Theophylline containing medications for 12 hours. Dipyridamole 48 hours prior to the test. Your remaining medications may be taken with water.  2. Nothing to eat or drink, except water, 3 hours prior to arrival time.   NO caffeine/decaffeinated products, or chocolate 12 hours prior to arrival.  3. NO perfume, cologne or lotion  4. Total time is 1 to 2 hours; you may want to bring reading material for the waiting time.  5. Please report to Admitting at the [facility] Main Entrance 60 minutes early for your test.  [place], [place] [address]  Diabetic Preparation:  Hold oral medications. You may take NPH and Lantus insulin. Do not take Humalog or Humulin R (Regular Insulin) the day of your test. Check blood sugars prior to leaving the house. If able to eat breakfast prior to 3 hour fasting, you may take all medications, including your insulin, Do not worry if you miss your breakfast dose of insulin - start at your next meal.  IF YOU THINK YOU MAY BE PREGNANT, OR ARE NURSING PLEASE INFORM THE TECHNOLOGIST.  In preparation for your appointment, medication and supplies will be purchased.  Appointment availability is limited, so if you need to cancel or reschedule, please call the Radiology Department at [phone]  24 hours in advance to avoid a cancellation fee of $100.00  What to Expect After you Arrive:  Once you arrive and check in for your appointment, you will be taken to a preparation room within the  Radiology Department.  A technologist or Nurse will obtain your medical history, verify that you are correctly prepped for the exam, and explain the procedure.  Afterwards,  an IV will be started in your arm and electrodes will be placed on your skin for EKG monitoring during the stress portion of the exam. Then you will be escorted to the PET/CT scanner.  There, staff will get you positioned on the scanner and obtain a blood pressure and EKG.  During the exam, you will continue to be connected to the EKG and blood pressure machines.  A small, safe amount of a radioactive tracer will be injected in your IV to obtain a series of pictures of your heart along with an injection of a stress agent.    After your Exam:  It is recommended that you eat a meal and drink a caffeinated beverage to counter act any effects of the stress agent.  Drink plenty of fluids for the remainder of the day and urinate frequently for the first couple of hours after the exam.  Your doctor will inform you of your test results within 7-10 business days.  For questions about your test or how to prepare for your test, please call: Marchia Bond, Cardiac Imaging Nurse Navigator  Gordy Clement, Cardiac Imaging Nurse Navigator Office: [phone]  Important Information About Sugar         Signed, Almyra Deforest, [place]  09/05/2021 10:59 PM    [place]

## 2021-09-03 NOTE — Patient Instructions (Addendum)
Medication Instructions:  Your physician recommends that you continue on your current medications as directed. Please refer to the Current Medication list given to you today.  *If you need a refill on your cardiac medications before your next appointment, please call your pharmacy*  Lab Work: NONE ordered at this time of appointment   If you have labs (blood work) drawn today and your tests are completely normal, you will receive your results only by: [place] (if you have MyChart) OR A paper copy in the mail If you have any lab test that is abnormal or we need to change your treatment, we will call you to review the results.  Testing/Procedures: CARDIAC PET- Your physician has requested that you have a Cardiac Pet Stress Test. This testing is completed at [facility] ([place], [place] [address]). The schedulers will call you to get this scheduled. Please follow instructions below and call the office with any questions/concerns [phone]).  Follow-Up: At [facility], you and your health needs are our priority.  As part of our continuing mission to provide you with exceptional heart care, we have created designated Provider Care Teams.  These Care Teams include your primary Cardiologist (physician) and Advanced Practice Providers (APPs -  Physician Assistants and Nurse Practitioners) who all work together to provide you with the care you need, when you need it.   Your next appointment:   3-4 month(s)  The format for your next appointment:   In Person  Provider:   Kirk Ruths, MD     Other Instructions  How to Prepare for Your Cardiac PET/CT Stress Test:  1. Please do not take these medications before your test:   Medications that may interfere with the cardiac pharmacological stress agent (ex. nitrates or beta-blockers) the day of the exam. Theophylline containing medications for 12 hours. Dipyridamole 48 hours prior to the  test. Your remaining medications may be taken with water.  2. Nothing to eat or drink, except water, 3 hours prior to arrival time.   NO caffeine/decaffeinated products, or chocolate 12 hours prior to arrival.  3. NO perfume, cologne or lotion  4. Total time is 1 to 2 hours; you may want to bring reading material for the waiting time.  5. Please report to Admitting at the [facility] Main Entrance 60 minutes early for your test.  [place], [place] [address]  Diabetic Preparation:  Hold oral medications. You may take NPH and Lantus insulin. Do not take Humalog or Humulin R (Regular Insulin) the day of your test. Check blood sugars prior to leaving the house. If able to eat breakfast prior to 3 hour fasting, you may take all medications, including your insulin, Do not worry if you miss your breakfast dose of insulin - start at your next meal.  IF YOU THINK YOU MAY BE PREGNANT, OR ARE NURSING PLEASE INFORM THE TECHNOLOGIST.  In preparation for your appointment, medication and supplies will be purchased.  Appointment availability is limited, so if you need to cancel or reschedule, please call the Radiology Department at [phone]  24 hours in advance to avoid a cancellation fee of $100.00  What to Expect After you Arrive:  Once you arrive and check in for your appointment, you will be taken to a preparation room within the Radiology Department.  A technologist or Nurse will obtain your medical history, verify that you are correctly prepped for the exam, and explain the procedure.  Afterwards,  an IV will be started in your arm and electrodes will be placed on your skin for EKG monitoring during the stress portion of the exam. Then you will be escorted to the PET/CT scanner.  There, staff will get you positioned on the scanner and obtain a blood pressure and EKG.  During the exam, you will continue to be connected to the EKG and blood pressure machines.  A small,  safe amount of a radioactive tracer will be injected in your IV to obtain a series of pictures of your heart along with an injection of a stress agent.    After your Exam:  It is recommended that you eat a meal and drink a caffeinated beverage to counter act any effects of the stress agent.  Drink plenty of fluids for the remainder of the day and urinate frequently for the first couple of hours after the exam.  Your doctor will inform you of your test results within 7-10 business days.  For questions about your test or how to prepare for your test, please call: Marchia Bond, Cardiac Imaging Nurse Navigator  Gordy Clement, Cardiac Imaging Nurse Navigator Office: [phone]  Important Information About Sugar

## 2021-09-05 ENCOUNTER — Encounter: Payer: Self-pay | Admitting: Physician Assistant

## 2021-09-23 ENCOUNTER — Other Ambulatory Visit (HOSPITAL_BASED_OUTPATIENT_CLINIC_OR_DEPARTMENT_OTHER): Payer: Self-pay

## 2021-09-23 ENCOUNTER — Encounter (HOSPITAL_BASED_OUTPATIENT_CLINIC_OR_DEPARTMENT_OTHER): Payer: Self-pay | Admitting: Emergency Medicine

## 2021-09-23 ENCOUNTER — Other Ambulatory Visit: Payer: Self-pay

## 2021-09-23 ENCOUNTER — Emergency Department (HOSPITAL_BASED_OUTPATIENT_CLINIC_OR_DEPARTMENT_OTHER)
Admission: EM | Admit: 2021-09-23 | Discharge: 2021-09-23 | Disposition: A | Discharge status: Home / Self Care | Payer: Medicare Other | Attending: Emergency Medicine | Admitting: Emergency Medicine

## 2021-09-23 DIAGNOSIS — I25119 Atherosclerotic heart disease of native coronary artery with unspecified angina pectoris: Secondary | ICD-10-CM | POA: Diagnosis not present

## 2021-09-23 DIAGNOSIS — Z7951 Long term (current) use of inhaled steroids: Secondary | ICD-10-CM | POA: Insufficient documentation

## 2021-09-23 DIAGNOSIS — Z7901 Long term (current) use of anticoagulants: Secondary | ICD-10-CM | POA: Diagnosis not present

## 2021-09-23 DIAGNOSIS — Z7982 Long term (current) use of aspirin: Secondary | ICD-10-CM | POA: Insufficient documentation

## 2021-09-23 DIAGNOSIS — I352 Nonrheumatic aortic (valve) stenosis with insufficiency: Secondary | ICD-10-CM | POA: Diagnosis not present

## 2021-09-23 DIAGNOSIS — Z79899 Other long term (current) drug therapy: Secondary | ICD-10-CM | POA: Diagnosis not present

## 2021-09-23 DIAGNOSIS — I10 Essential (primary) hypertension: Secondary | ICD-10-CM | POA: Diagnosis not present

## 2021-09-23 DIAGNOSIS — I4891 Unspecified atrial fibrillation: Secondary | ICD-10-CM | POA: Insufficient documentation

## 2021-09-23 DIAGNOSIS — I2510 Atherosclerotic heart disease of native coronary artery without angina pectoris: Secondary | ICD-10-CM | POA: Insufficient documentation

## 2021-09-23 DIAGNOSIS — E119 Type 2 diabetes mellitus without complications: Secondary | ICD-10-CM | POA: Diagnosis not present

## 2021-09-23 DIAGNOSIS — Z955 Presence of coronary angioplasty implant and graft: Secondary | ICD-10-CM | POA: Diagnosis not present

## 2021-09-23 DIAGNOSIS — R002 Palpitations: Secondary | ICD-10-CM | POA: Diagnosis present

## 2021-09-23 LAB — COMPREHENSIVE METABOLIC PANEL
ALT: 21 U/L (ref 0–44)
AST: 25 U/L (ref 15–41)
Albumin: 4.5 g/dL (ref 3.5–5.0)
Alkaline Phosphatase: 146 U/L — ABNORMAL HIGH (ref 38–126)
Anion gap: 9 (ref 5–15)
BUN: 16 mg/dL (ref 8–23)
CO2: 28 mmol/L (ref 22–32)
Calcium: 10.4 mg/dL — ABNORMAL HIGH (ref 8.9–10.3)
Chloride: 104 mmol/L (ref 98–111)
Creatinine, Ser: 0.85 mg/dL (ref 0.44–1.00)
GFR, Estimated: >60 mL/min (ref >60)
Glucose, Bld: 101 mg/dL — ABNORMAL HIGH (ref 70–99)
Potassium: 3.9 mmol/L (ref 3.5–5.1)
Sodium: 141 mmol/L (ref 135–145)
Total Bilirubin: 0.7 mg/dL (ref 0.3–1.2)
Total Protein: 6.9 g/dL (ref 6.5–8.1)

## 2021-09-23 LAB — CBC WITH DIFFERENTIAL/PLATELET
Abs Immature Granulocytes: 0.03 10*3/uL (ref 0.00–0.07)
Basophils Absolute: 0 10*3/uL (ref 0.0–0.1)
Basophils Relative: 1 %
Eosinophils Absolute: 0.2 10*3/uL (ref 0.0–0.5)
Eosinophils Relative: 3 %
HCT: 45.9 % (ref 36.0–46.0)
Hemoglobin: 14.6 g/dL (ref 12.0–15.0)
Immature Granulocytes: 0 %
Lymphocytes Relative: 27 %
Lymphs Abs: 2.4 10*3/uL (ref 0.7–4.0)
MCH: 25.7 pg — ABNORMAL LOW (ref 26.0–34.0)
MCHC: 31.8 g/dL (ref 30.0–36.0)
MCV: 80.8 fL (ref 80.0–100.0)
Monocytes Absolute: 0.7 10*3/uL (ref 0.1–1.0)
Monocytes Relative: 8 %
Neutro Abs: 5.4 10*3/uL (ref 1.7–7.7)
Neutrophils Relative %: 61 %
Platelets: 230 10*3/uL (ref 150–400)
RBC: 5.68 MIL/uL — ABNORMAL HIGH (ref 3.87–5.11)
RDW: 14.7 % (ref 11.5–15.5)
WBC: 8.8 10*3/uL (ref 4.0–10.5)
nRBC: 0 % (ref 0.0–0.2)

## 2021-09-23 LAB — MAGNESIUM: Magnesium: 1.9 mg/dL (ref 1.7–2.4)

## 2021-09-23 LAB — TROPONIN I (HIGH SENSITIVITY)
Troponin I (High Sensitivity): 9 ng/L (ref <18)
Troponin I (High Sensitivity): 31 ng/L — ABNORMAL HIGH (ref <18)

## 2021-09-23 LAB — BRAIN NATRIURETIC PEPTIDE: B Natriuretic Peptide: 184.4 pg/mL — ABNORMAL HIGH (ref 0.0–100.0)

## 2021-09-23 LAB — TSH: TSH: 4.415 u[IU]/mL (ref 0.350–4.500)

## 2021-09-23 MED ORDER — METOPROLOL TARTRATE 75 MG PO TABS
ORAL_TABLET | ORAL | 1 refills | Status: DC
  Filled 2021-09-23: qty 60, 60d supply, fill #0

## 2021-09-23 MED ORDER — DILTIAZEM LOAD VIA INFUSION
10.0000 mg | Freq: Once | INTRAVENOUS | Status: DC
Start: 2021-09-23 — End: 2021-09-23
  Filled 2021-09-23: qty 10

## 2021-09-23 MED ORDER — APIXABAN 5 MG PO TABS
5.0000 mg | ORAL_TABLET | Freq: Two times a day (BID) | ORAL | 1 refills | Status: DC
  Filled 2021-09-23: qty 60, 30d supply, fill #0

## 2021-09-23 MED ORDER — METOPROLOL SUCCINATE ER 25 MG PO TB24
25.0000 mg | ORAL_TABLET | Freq: Every day | ORAL | Status: DC
  Administered 2021-09-23: 25 mg via ORAL
  Filled 2021-09-23: qty 1

## 2021-09-23 MED ORDER — DILTIAZEM HCL-DEXTROSE 125-5 MG/125ML-% IV SOLN (PREMIX)
5.0000 mg/h | INTRAVENOUS | Status: DC
  Filled 2021-09-23: qty 125

## 2021-09-23 MED ORDER — APIXABAN 2.5 MG PO TABS
5.0000 mg | ORAL_TABLET | Freq: Once | ORAL | Status: AC
Start: 2021-09-23 — End: 2021-09-23
  Administered 2021-09-23: 5 mg via ORAL
  Filled 2021-09-23: qty 2

## 2021-09-23 MED ORDER — APIXABAN 5 MG PO TABS: 5.0000 mg | ORAL_TABLET | Freq: Two times a day (BID) | ORAL | 1 refills | Status: DC

## 2021-09-23 MED ORDER — METOPROLOL SUCCINATE ER 25 MG PO TB24
50.0000 mg | ORAL_TABLET | Freq: Every day | ORAL | Status: DC
  Administered 2021-09-23: 50 mg via ORAL
  Filled 2021-09-23: qty 2

## 2021-09-23 MED ORDER — METOPROLOL TARTRATE 75 MG PO TABS: ORAL_TABLET | ORAL | 1 refills | Status: DC

## 2021-09-23 NOTE — ED Notes (Signed)
Pt requested to go use the restroom noticed when Pt returned she appeared short of breath and trying to catch her breath.

## 2021-09-23 NOTE — ED Triage Notes (Signed)
Patient reports to the ER for feeling like her heart is beating out of her chest. She feels "wrong". No hx of A-fib. Reports she has a hx of a stent.

## 2021-09-23 NOTE — Discharge Instructions (Signed)
Call your cardiologist tomorrow and advise them that Dr. Gwenlyn Found advised to schedule you to be seen next week

## 2021-09-23 NOTE — ED Provider Notes (Signed)
[place] EMERGENCY DEPT Provider Note   CSN: [id number] Arrival date & time: 09/23/21  1303     History  Chief Complaint  Patient presents with   Chest Pain    Anne Martin is a 76 y.o. female.  Patient complains of fast heartbeat patient reports she feels a fluttering sensation in her chest.  Patient states she feels like her heart is pounding.  Patient has a past medical history of coronary artery disease she has a stent.  Patient has aortic stenosis ,aortic insufficiency and hypertension.  Patient has a history of angina she is followed by Dr. Stanford Breed.  Patient does take nitroglycerin at home.  Patient reports she did not take her medications this morning.  The history is provided by the patient. No language interpreter was used.  Palpitations Palpitations quality:  Fast Onset quality:  Sudden Duration:  2 hours Timing:  Constant Progression:  Worsening Chronicity:  New Context: not anxiety and not caffeine   Relieved by:  Nothing Worsened by:  Nothing Ineffective treatments:  None tried Risk factors: diabetes mellitus        Home Medications Prior to Admission medications   Medication Sig Start Date End Date Taking? Authorizing Provider  aspirin 81 MG EC tablet Take 81 mg by mouth daily.   Yes [provider]  atorvastatin (LIPITOR) 80 MG tablet TAKE 1 TABLET BY MOUTH EVERY DAY AT 6PM 08/12/21  Yes Crenshaw, Denice Bors, MD  calcium carbonate (OSCAL) 1500 (600 Ca) MG TABS tablet Take 600 mg of elemental calcium by mouth daily.   Yes [provider]  cholecalciferol (VITAMIN D) 1000 UNITS tablet Take 1,000 Units by mouth every evening.    Yes [provider]  isosorbide mononitrate (IMDUR) 30 MG 24 hr tablet TAKE 1 TABLET BY MOUTH EVERY DAY 03/30/21  Yes Crenshaw, Denice Bors, MD  loratadine (CLARITIN) 10 MG tablet Take 10 mg by mouth daily.    Yes [provider]  metoprolol succinate (TOPROL-XL) 50 MG 24 hr tablet Take 1  tablet (50 mg total) by mouth daily. Take with or immediately following a meal. 03/02/21  Yes Crenshaw, Denice Bors, MD  nitroGLYCERIN (NITROSTAT) 0.4 MG SL tablet Place 1 tablet (0.4 mg total) under the tongue every 5 (five) minutes as needed. MAX 3 doses 02/05/21  Yes Crenshaw, Denice Bors, MD  Probiotic Product (PROBIOTIC PO) Take 1 tablet by mouth daily.    Yes [provider]  vitamin C (ASCORBIC ACID) 500 MG tablet Take 500 mg by mouth at bedtime.   Yes [provider]  YUVAFEM 10 MCG TABS vaginal tablet Place 10 mcg vaginally 2 (two) times a week.  10/29/17  Yes [provider]  albuterol (VENTOLIN HFA) 108 (90 Base) MCG/ACT inhaler INHALE 2 PUFFS INTO THE LUNGS EVERY 4 HOURS AS NEEDED FOR WHEEZING OR SHORTNESS OF BREATH. Patient not taking: Reported on 09/23/2021 08/28/20   Laurey Morale, MD  apixaban (ELIQUIS) 5 MG TABS tablet Take 1 tablet (5 mg total) by mouth 2 (two) times daily. 09/23/21 11/22/21  Fransico Meadow, PA-C  Metoprolol Tartrate 75 MG TABS One tablet a day 09/23/21   Fransico Meadow, PA-C      Allergies    Procaine and Procaine hcl    Review of Systems   Review of Systems  Cardiovascular:  Positive for palpitations.  Neurological:  Positive for light-headedness.  All other systems reviewed and are negative.   Physical Exam Updated Vital Signs BP Marland Kitchen)  150/79   Pulse 72   Temp 98.2 F (36.8 C) (Oral)   Resp 12   LMP  (LMP Unknown)   SpO2 98%  Physical Exam Vitals and nursing note reviewed.  Constitutional:      Appearance: She is well-developed.  HENT:     Head: Normocephalic.  Cardiovascular:     Rate and Rhythm: Normal rate and regular rhythm.     Heart sounds: Murmur heard.  Pulmonary:     Effort: Pulmonary effort is normal. No respiratory distress.     Breath sounds: Normal breath sounds.  Abdominal:     General: There is no distension.  Musculoskeletal:        General: Normal range of motion.     Cervical back: Normal range of  motion.  Skin:    General: Skin is warm.  Neurological:     General: No focal deficit present.     Mental Status: She is alert and oriented to person, place, and time.     ED Results / Procedures / Treatments   Labs (all labs ordered are listed, but only abnormal results are displayed) Labs Reviewed  COMPREHENSIVE METABOLIC PANEL - Abnormal; Notable for the following components:      Result Value   Glucose, Bld 101 (*)    Calcium 10.4 (*)    Alkaline Phosphatase 146 (*)    All other components within normal limits  BRAIN NATRIURETIC PEPTIDE - Abnormal; Notable for the following components:   B Natriuretic Peptide 184.4 (*)    All other components within normal limits  CBC WITH DIFFERENTIAL/PLATELET - Abnormal; Notable for the following components:   RBC 5.68 (*)    MCH 25.7 (*)    All other components within normal limits  TROPONIN I (HIGH SENSITIVITY) - Abnormal; Notable for the following components:   Troponin I (High Sensitivity) 31 (*)    All other components within normal limits  MAGNESIUM  TSH  TROPONIN I (HIGH SENSITIVITY)    EKG EKG Interpretation  Date/Time:  Wednesday September 23 2021 13:29:10 EDT Ventricular Rate:  95 PR Interval:  168 QRS Duration: 89 QT Interval:  348 QTC Calculation: 438 R Axis:   67 Text Interpretation: Sinus rhythm Biatrial enlargement Confirmed by Octaviano Glow [phone]) on 09/23/2021 5:19:50 PM  Radiology No results found.  Procedures Procedures    Medications Ordered in ED Medications  metoprolol succinate (TOPROL-XL) 24 hr tablet 50 mg (50 mg Oral Given 09/23/21 1754)  metoprolol succinate (TOPROL-XL) 24 hr tablet 25 mg (25 mg Oral Given 09/23/21 1753)  apixaban (ELIQUIS) tablet 5 mg (5 mg Oral Given 09/23/21 1755)    ED Course/ Medical Decision Making/ A&P       CHA2DS2-VASc Score: 4                    Medical Decision Making Patient complains of heart pounding and heart beating fast.  Amount and/or Complexity of Data  Reviewed Independent Historian: spouse    Details: Patient is here with her husband and he is supportive External Data Reviewed: notes.    Details: Cardiology notes reviewed patient is followed by Dr. Stanford Breed Labs: ordered. Decision-making details documented in ED Course.    Details: First troponin is 9 repeat troponin is 31. ECG/medicine tests: ordered and independent interpretation performed. Decision-making details documented in ED Course.    Details: Patient's initial EKG showed a rapid atrial fibrillation of 161.  When patient was placed in exam room and monitor was placed  patient had return to a normal sinus EKG at this time showed a normal sinus rhythm with no ST changes Discussion of management or test interpretation with external provider(s): I spoke with Dr. Gwenlyn Found cardiology on call for Dr. Stanford Breed.  He advised to begin patient on Eliquis.  He advised to increase her metoprolol to 75 mg a day.  He does not feel patient needs inpatient care as long as she is pain-free and remains in normal sinus.  Risk Prescription drug management. Risk Details: I discussed patient's symptoms with her.  She is not having chest discomfort however she does occasionally experience what feels like a flutter.  I reviewed patient's monitor strips from today's visit.  She is experiencing some PVCs.  Patient has remained in normal sinus rhythm.  I discussed options of hospitalization versus follow-up.  Patient prefers to go home and follow-up with her physician.  Will call tomorrow for appointment time           Final Clinical Impression(s) / ED Diagnoses Final diagnoses:  Atrial fibrillation, unspecified type [facility])    Rx / DC Orders ED Discharge Orders          Ordered    Amb referral to AFIB Clinic        09/23/21 1320    Metoprolol Tartrate 75 MG TABS  Status:  Discontinued        09/23/21 1739    apixaban (ELIQUIS) 5 MG TABS tablet  2 times daily,   Status:  Discontinued        09/23/21  1739    Ambulatory referral to Cardiology       Comments: New onset afib   09/23/21 1744    apixaban (ELIQUIS) 5 MG TABS tablet  2 times daily        09/23/21 1923    Metoprolol Tartrate 75 MG TABS        09/23/21 1923          An After Visit Summary was printed and given to the patient.     Sidney Ace 09/23/21 2154    Fredia Sorrow, MD 10/10/21 [phone]

## 2021-09-24 ENCOUNTER — Telehealth: Payer: Self-pay | Admitting: Cardiology

## 2021-09-24 NOTE — Telephone Encounter (Signed)
Pt was seen at the hospital yesterday and was informed that she needs to see a cardiologist today. I scheduled an appt for tomorrow at 8:50am, but she wants to be seen today.

## 2021-09-24 NOTE — Progress Notes (Signed)
Office Visit    Patient Name: Anne Martin Date of Encounter: 09/25/2021  Primary Care Provider:  Laurey Morale, MD Primary Cardiologist:  Kirk Ruths, MD  Chief Complaint    76 year old female with a history of CAD, aortic insufficiency, aortic valve stenosis, paroxysmal atrial fibrillation, hypertension, hyperlipidemia, and rheumatic fever who presents for post hospital follow-up related to new onset atrial fibrillation.  Past Medical History    Past Medical History:  Diagnosis Date   Allergy    seasonal   Anemia    prior to hysterectomy, fibroids   Aortic insufficiency    a. 10/2015 Echo: mild to moderate AI.   Aortic stenosis    a. 10/2015 Echo: moderate AS.   Blood transfusion without reported diagnosis    with hysterectomy   CAD (coronary artery disease)    a. 08/2009 s/p cath after abnormal nuclear study; LM nl, LAD 60%, D2 90% (PCI/DES), LCX 50 -60%, RCA nl, nl EF;  b. 2012 MV: no ischemia/infarct;  c. 08/2015 MV: no ischemia/infarct, EF 70%.   Complication of anesthesia    Diastolic dysfunction    a. 10/2015 Echo: EF 55-60%, no rwma, Gr1 DD, mod AS, mild to mod AI.   Essential hypertension    preventive meds due to heart   H/O: rheumatic fever    Heart murmur    History of colonoscopy    Hyperlipidemia    preventative med for heart   Osteopenia    last DEXA 11-22-13    PONV (postoperative nausea and vomiting)    Past Surgical History:  Procedure Laterality Date   ANGIOPLASTY  08/2009   stents   AORTIC ARCH ANGIOGRAPHY N/A 03/29/2018   Procedure: AORTIC ARCH ANGIOGRAPHY;  Surgeon: Nelva Bush, MD;  Location: [facility];  Service: Cardiovascular;  Laterality: N/A;   BACK SURGERY     11/2020   CHOLECYSTECTOMY  02/2003   COLONOSCOPY  02/02/2016   per Dr. Henrene Pastor, adenomatous polyps, repeat in 5 yrs    INTRAVASCULAR PRESSURE WIRE/FFR STUDY N/A 03/29/2018   Procedure: INTRAVASCULAR PRESSURE WIRE/FFR STUDY;  Surgeon: Nelva Bush, MD;   Location: [facility];  Service: Cardiovascular;  Laterality: N/A;   KNEE ARTHROSCOPY     Rt. Knee   LEFT HEART CATH AND CORONARY ANGIOGRAPHY N/A 03/29/2018   Procedure: LEFT HEART CATH AND CORONARY ANGIOGRAPHY;  Surgeon: Nelva Bush, MD;  Location: [facility];  Service: Cardiovascular;  Laterality: N/A;   LUMBAR LAMINECTOMY/DECOMPRESSION MICRODISCECTOMY Left 01/03/2020   Procedure: Left L2-3 decompression disectomy, Insitu Fusion Lumbar 2-3;  Surgeon: Melina Schools, MD;  Location: [facility];  Service: Orthopedics;  Laterality: Left;   POLYPECTOMY     TONSILLECTOMY     TONSILLECTOMY     VAGINAL HYSTERECTOMY  1994    Allergies  Allergies  Allergen Reactions   Procaine Rash    Novocain reaction as child   Procaine Hcl Other (See Comments)    Unknown    History of Present Illness    76 year old female with a history of CAD, aortic insufficiency, aortic valve stenosis, paroxysmal atrial fibrillation, hypertension, hyperlipidemia, and rheumatic fever.  Cardiac catheterization in May 2011 revealed normal left main, 60% LAD, 90% D2 s/p DES, 50-60% LCx, normal RCA and normal EF.  Carotid Dopplers in 2013 were normal.  Repeat cardiac catheterization in December 2018 showed nonobstructive disease with moderate LAD lesion, moderate in-stent restenosis in second diagonal, no disease in left circumflex or RCA.  Aorta was mildly  dilated.  CTA in August 2021 showed no evidence of thoracic aortic aneurysm.  Outpatient cardiac monitor in April 2020 showed sinus rhythm with occasional PACs, brief PAT and PVCs.  Echocardiogram in November 2020 showed normal EF, mild LVH, moderate AI, mild to moderate aortic valve stenosis.  Evaluated in the ED on 08/29/2021 with complaints of chest pain.  Troponin was negative x2.  It was felt that her chest pain may be related to anxiety.  She was last seen in the office on 09/02/2021 and reported no further episodes of chest discomfort.  Cardiac PET stress  test was recommended for further evaluation.  Unfortunately, she presented to the ED on 09/23/2021 with complaints of fast heartbeat, lightheadedness, shortness of breath, and fluttering sensation in her chest.  EKG upon arrival showed new onset atrial fibrillation with RVR, HR 161.  She spontaneously converted to sinus rhythm.  It was discussed with Dr. Gwenlyn Found, on-call provider for cardiology, who advised initiation of Eliquis.  Metoprolol was also increased to 75 mg daily. She was referred to the A-fib clinic.  She was discharged home in stable condition and advised to follow-up with cardiology as an outpatient.  She presents today for follow-up. Since her last visit since her recent ED visit she has been stable from a cardiac standpoint.  She reports an ongoing intermittent sensation of what feels like heartburn in her mid lower chest that occurs intermittently, she denies any exertional symptoms concerning for angina though she does note occasional dyspnea.  She is still waiting for her cardiac PET stress test to be scheduled.  She denies palpitations and is unaware of any recurrent atrial fibrillation at this time.  She does report that she has been under a significant amount of stress as her husband is recovering from open heart surgery.  Other than her ongoing dyspnea, intermittent chest discomfort, she denies any additional concerns today.  Home Medications    Current Outpatient Medications  Medication Sig Dispense Refill   albuterol (VENTOLIN HFA) 108 (90 Base) MCG/ACT inhaler INHALE 2 PUFFS INTO THE LUNGS EVERY 4 HOURS AS NEEDED FOR WHEEZING OR SHORTNESS OF BREATH. 18 each 3   apixaban (ELIQUIS) 5 MG TABS tablet Take 1 tablet (5 mg total) by mouth 2 (two) times daily. 60 tablet 1   aspirin 81 MG EC tablet Take 81 mg by mouth daily.     atorvastatin (LIPITOR) 80 MG tablet TAKE 1 TABLET BY MOUTH EVERY DAY AT 6PM 90 tablet 1   calcium carbonate (OSCAL) 1500 (600 Ca) MG TABS tablet Take 600 mg of  elemental calcium by mouth daily.     cholecalciferol (VITAMIN D) 1000 UNITS tablet Take 1,000 Units by mouth every evening.      isosorbide mononitrate (IMDUR) 30 MG 24 hr tablet TAKE 1 TABLET BY MOUTH EVERY DAY 90 tablet 1   loratadine (CLARITIN) 10 MG tablet Take 10 mg by mouth daily.      nitroGLYCERIN (NITROSTAT) 0.4 MG SL tablet Place 1 tablet (0.4 mg total) under the tongue every 5 (five) minutes as needed. MAX 3 doses 25 tablet 3   Probiotic Product (PROBIOTIC PO) Take 1 tablet by mouth daily.      vitamin C (ASCORBIC ACID) 500 MG tablet Take 500 mg by mouth at bedtime.     YUVAFEM 10 MCG TABS vaginal tablet Place 10 mcg vaginally 2 (two) times a week.   1   metoprolol succinate (TOPROL-XL) 50 MG 24 hr tablet Take 1.5 tablets (75 mg total)  by mouth daily. Take with or immediately following a meal. 135 tablet 3   No current facility-administered medications for this visit.     Review of Systems    She denies chest pain, palpitations, dyspnea, pnd, orthopnea, n, v, dizziness, syncope, edema, weight gain, or early satiety. All other systems reviewed and are otherwise negative except as noted above.   Physical Exam    VS:  BP 124/78   Pulse 61   Ht '5\' 4"'$  (1.626 m)   Wt 171 lb (77.6 kg)   LMP  (LMP Unknown)   BMI 29.35 kg/m  GEN: Well nourished, well developed, in no acute distress. HEENT: normal. Neck: Supple, no JVD, carotid bruits, or masses. Cardiac: RRR, no murmurs, rubs, or gallops. No clubbing, cyanosis, edema.  Radials/DP/PT 2+ and equal bilaterally.  Respiratory:  Respirations regular and unlabored, clear to auscultation bilaterally. GI: Soft, nontender, nondistended, BS + x 4. MS: no deformity or atrophy. Skin: warm and dry, no rash. Neuro:  Strength and sensation are intact. Psych: Normal affect.  Accessory Clinical Findings    ECG personally reviewed by me today -sinus rhythm with marked sinus arrhythmia, 61 bpm- no acute changes.  Lab Results  Component Value  Date   WBC 8.8 09/23/2021   HGB 14.6 09/23/2021   HCT 45.9 09/23/2021   MCV 80.8 09/23/2021   PLT 230 09/23/2021   Lab Results  Component Value Date   CREATININE 0.85 09/23/2021   BUN 16 09/23/2021   NA 141 09/23/2021   K 3.9 09/23/2021   CL 104 09/23/2021   CO2 28 09/23/2021   Lab Results  Component Value Date   ALT 21 09/23/2021   AST 25 09/23/2021   GGT 15 11/12/2016   ALKPHOS 146 (H) 09/23/2021   BILITOT 0.7 09/23/2021   Lab Results  Component Value Date   CHOL 121 03/23/2021   HDL 63.40 03/23/2021   LDLCALC 36 03/23/2021   TRIG 108.0 03/23/2021   CHOLHDL 2 03/23/2021    Lab Results  Component Value Date   HGBA1C 5.9 03/23/2021    Assessment & Plan    1. New onset atrial fibrillation: To ED on 09/23/2021 with complaints of fast heartbeat, lightheadedness, shortness of breath, and fluttering sensation in her chest.  EKG showed new onset atrial fibrillation with RVR, HR 161.  She spontaneously converted to sinus rhythm. Metoprolol was increased to 75 mg daily and sh was started on Eliquis. She is maintaining NSR today.  She denies any recurrent symptoms.  Discussed self monitoring with cardia mobile device.  Will check 14-day Zio monitor to assess a fib burden.  Continue metoprolol, Eliquis.   2. CAD/chest pain: She reports ongoing intermittent chest discomfort.  She notes a strong family history of CAD.  Recommend she proceed with cardiac PET stress test as planned.  Continue aspirin, metoprolol, isosorbide, and Lipitor.  3. Aortic valve insufficiency/aortic valve stenosis: Echocardiogram in November 2020 showed normal EF, mild LVH, moderate AI, mild to moderate aortic valve stenosis.  Consider repeat echo in 6 months to 1 year.  4. Hypertension: BP well controlled. Continue current antihypertensive regimen.   5. Hyperlipidemia: LDL was 36 in 03/2021. Continue Lipitor.   6. Disposition: Follow-up in 6-8 weeks.   Lenna Sciara, NP 09/25/2021, 1:55 PM

## 2021-09-24 NOTE — Telephone Encounter (Signed)
Spoke to pt, discussed appt scheduled for tomorrow. Aware no availability today. Informed referral has been made for the Afib clinic but to keep appt tomorrow at [place] office. Informed that will most likely order a monitor for her to determine Afib burden. Patient verbalized understanding and agreeable to plan.

## 2021-09-25 ENCOUNTER — Ambulatory Visit (INDEPENDENT_AMBULATORY_CARE_PROVIDER_SITE_OTHER): Payer: Medicare Other

## 2021-09-25 ENCOUNTER — Encounter: Payer: Self-pay | Admitting: Nurse Practitioner

## 2021-09-25 ENCOUNTER — Ambulatory Visit (INDEPENDENT_AMBULATORY_CARE_PROVIDER_SITE_OTHER): Payer: Medicare Other | Admitting: Nurse Practitioner

## 2021-09-25 VITALS — BP 124/78 | HR 61 | Ht 64.0 in | Wt 171.0 lb

## 2021-09-25 DIAGNOSIS — E785 Hyperlipidemia, unspecified: Secondary | ICD-10-CM

## 2021-09-25 DIAGNOSIS — I480 Paroxysmal atrial fibrillation: Secondary | ICD-10-CM

## 2021-09-25 DIAGNOSIS — I2510 Atherosclerotic heart disease of native coronary artery without angina pectoris: Secondary | ICD-10-CM | POA: Diagnosis not present

## 2021-09-25 DIAGNOSIS — I10 Essential (primary) hypertension: Secondary | ICD-10-CM

## 2021-09-25 DIAGNOSIS — I350 Nonrheumatic aortic (valve) stenosis: Secondary | ICD-10-CM | POA: Diagnosis not present

## 2021-09-25 MED ORDER — METOPROLOL SUCCINATE ER 50 MG PO TB24: 75.0000 mg | ORAL_TABLET | Freq: Every day | ORAL | 3 refills | Status: DC

## 2021-09-25 NOTE — Patient Instructions (Addendum)
Medication Instructions:  STOP Metoprolol Tartrate  INCREASE Metoprolol Succinate to 75 mg (1.5  tablets) daily  *If you need a refill on your cardiac medications before your next appointment, please call your pharmacy*  Lab Work: NONE ordered at this time of appointment   If you have labs (blood work) drawn today and your tests are completely normal, you will receive your results only by: [place] (if you have MyChart) OR A paper copy in the mail If you have any lab test that is abnormal or we need to change your treatment, we will call you to review the results.  Testing/Procedures:  [facility]- Long Term Monitor Instructions  Your physician has requested you wear a ZIO patch monitor for 14 days.  This is a single patch monitor. [employer] supplies one patch monitor per enrollment. Additional stickers are not available. Please do not apply patch if you will be having a Nuclear Stress Test,  Echocardiogram, Cardiac CT, MRI, or Chest Xray during the period you would be wearing the  monitor. The patch cannot be worn during these tests. You cannot remove and re-apply the  ZIO XT patch monitor.  Your ZIO patch monitor will be mailed 3 day USPS to your address on file. It may take 3-5 days  to receive your monitor after you have been enrolled.  Once you have received your monitor, please review the enclosed instructions. Your monitor  has already been registered assigning a specific monitor serial # to you.  Billing and Patient Assistance Program Information  We have supplied [employer] with any of your insurance information on file for billing purposes. [employer] offers a sliding scale Patient Assistance Program for patients that do not have  insurance, or whose insurance does not completely cover the cost of the ZIO monitor.  You must apply for the Patient Assistance Program to qualify for this discounted rate.  To apply, please call [employer] at [phone], select option 4, select option  2, ask to apply for  Patient Assistance Program. Theodore Demark will ask your household income, and how many people  are in your household. They will quote your out-of-pocket cost based on that information.  [employer] will also be able to set up a 29-month interest-free payment plan if needed.  Applying the monitor   Shave hair from upper left chest.  Hold abrader disc by orange tab. Rub abrader in 40 strokes over the upper left chest as  indicated in your monitor instructions.  Clean area with 4 enclosed alcohol pads. Let dry.  Apply patch as indicated in monitor instructions. Patch will be placed under collarbone on left  side of chest with arrow pointing upward.  Rub patch adhesive wings for 2 minutes. Remove white label marked "1". Remove the white  label marked "2". Rub patch adhesive wings for 2 additional minutes.  While looking in a mirror, press and release button in center of patch. A small green light will  flash 3-4 times. This will be your only indicator that the monitor has been turned on.  Do not shower for the first 24 hours. You may shower after the first 24 hours.  Press the button if you feel a symptom. You will hear a small click. Record Date, Time and  Symptom in the Patient Logbook.  When you are ready to remove the patch, follow instructions on the last 2 pages of Patient  Logbook. Stick patch monitor onto the last page of Patient Logbook.  Place Patient Logbook in the blue  and white box. Use locking tab on box and tape box closed  securely. The blue and white box has prepaid postage on it. Please place it in the mailbox as  soon as possible. Your physician should have your test results approximately 7 days after the  monitor has been mailed back to [facility].  Call [facility] at [phone] if you have questions regarding  your ZIO XT patch monitor. Call them immediately if you see an orange light blinking on your  monitor.  If your monitor falls  off in less than 4 days, contact our Monitor department at [phone].  If your monitor becomes loose or falls off after 4 days call [employer] at [phone] for  suggestions on securing your monitor  Follow-Up: At [facility], you and your health needs are our priority.  As part of our continuing mission to provide you with exceptional heart care, we have created designated Provider Care Teams.  These Care Teams include your primary Cardiologist (physician) and Advanced Practice Providers (APPs -  Physician Assistants and Nurse Practitioners) who all work together to provide you with the care you need, when you need it.  Your next appointment:   6-8 week(s)  The format for your next appointment:   In Person  Provider:   Almyra Deforest, PA-C or Diona Browner, NP        Other Instructions  [facility] [employer]: Website: [email or web address]  EMILY MONGE, NP RECOMMENDS YOU PURCHASE  " Kardia" By [employer] FROM THE  [employer]  APP PLAY STORE.  THE APP IS FREE , BUT THE  EQUIPMENT HAS A COST. IT ALLOWS YOU TO OBTAIN A RECORDING OF YOUR HEART RATE AND RHYTHM BY PROVIDING A SHORT STRIP THAT YOU CAN SHARE WITH YOUR PROVIDER.     Important Information About Sugar

## 2021-09-25 NOTE — Progress Notes (Unsigned)
Enrolled patient for a 14 day Zio XT monitor to be mailed to patients home   Dr Stanford Breed to read

## 2021-09-30 DIAGNOSIS — I480 Paroxysmal atrial fibrillation: Secondary | ICD-10-CM

## 2021-10-28 ENCOUNTER — Telehealth: Payer: Self-pay

## 2021-10-28 NOTE — Telephone Encounter (Signed)
Lmom, waiting on a return call to discuss monitor results.

## 2021-11-10 ENCOUNTER — Ambulatory Visit (INDEPENDENT_AMBULATORY_CARE_PROVIDER_SITE_OTHER): Payer: Medicare Other | Admitting: Physician Assistant

## 2021-11-10 ENCOUNTER — Encounter: Payer: Self-pay | Admitting: Physician Assistant

## 2021-11-10 VITALS — BP 124/78 | HR 58 | Ht 65.0 in | Wt 171.2 lb

## 2021-11-10 DIAGNOSIS — E785 Hyperlipidemia, unspecified: Secondary | ICD-10-CM

## 2021-11-10 DIAGNOSIS — I10 Essential (primary) hypertension: Secondary | ICD-10-CM | POA: Diagnosis not present

## 2021-11-10 DIAGNOSIS — I25119 Atherosclerotic heart disease of native coronary artery with unspecified angina pectoris: Secondary | ICD-10-CM | POA: Diagnosis not present

## 2021-11-10 DIAGNOSIS — I480 Paroxysmal atrial fibrillation: Secondary | ICD-10-CM | POA: Diagnosis not present

## 2021-11-10 NOTE — Patient Instructions (Signed)
Medication Instructions:  Your physician recommends that you continue on your current medications as directed. Please refer to the Current Medication list given to you today.  *If you need a refill on your cardiac medications before your next appointment, please call your pharmacy*  Lab Work: NONE ordered at this time of appointment   If you have labs (blood work) drawn today and your tests are completely normal, you will receive your results only by: [place] (if you have MyChart) OR A paper copy in the mail If you have any lab test that is abnormal or we need to change your treatment, we will call you to review the results.  Testing/Procedures: NONE ordered at this time of appointment   Follow-Up: At [facility], you and your health needs are our priority.  As part of our continuing mission to provide you with exceptional heart care, we have created designated Provider Care Teams.  These Care Teams include your primary Cardiologist (physician) and Advanced Practice Providers (APPs -  Physician Assistants and Nurse Practitioners) who all work together to provide you with the care you need, when you need it.    Your next appointment:   As previously scheduled   The format for your next appointment:   In Person  Provider:   Kirk Ruths, MD     Other Instructions   Important Information About Sugar

## 2021-11-10 NOTE — Progress Notes (Unsigned)
Cardiology Office Note:    Date:  11/12/2021   ID:  Anne Martin, DOB 03/24/1946, MRN [id number]  PCP:  Laurey Morale, MD   [facility] Cardiologist:  Kirk Ruths, MD     Referring MD: Laurey Morale, MD   Chief Complaint  Patient presents with   Follow-up    Seen for Dr. Stanford Breed    History of Present Illness:    Anne Martin is a 76 y.o. female with a hx of CAD, aortic insufficiency/aortic stenosis, hyperlipidemia, history of rheumatic fever, and history of hypertension.  Cardiac catheterization performed in May 2011 revealed normal left main, 60% LAD, 90% D2, 50 to 60% left circumflex lesion, normal RCA, normal EF.  Patient underwent PCI of diagonal with DES.  Carotid Doppler in October 2013 were normal.  Cardiac catheterization in December 2019 showed nonobstructive disease with moderate mid LAD lesion, moderate in-stent restenosis in second diagonal, no disease in the left circumflex or RCA.  Ascending aorta was mildly dilated.  A CTA in August 2021 showed no evidence of thoracic aortic aneurysm.  Heart monitor in April 2022 showed sinus rhythm with occasional PACs, brief PAT and a PVC.  Echocardiogram in November 2022 showed normal EF, mild LVH, moderate AI, mild to moderate aortic stenosis.  Patient was last seen by Dr. Stanford Breed on 07/30/2021 at which time she had dyspnea with more vigorous activity relieved by rest.  She messaged cardiology service on 08/20/2021 with complaint of intermittent chest pain.  She was ultimately seen in the emergency room on 08/29/2021.  Serial troponin was negative x2.  ED physician felt her chest pain might be more related to anxiety due to her husband's heart surgery.  We ordered a PET stress test.  Unfortunately, she presented to the ED on 09/23/2021 with complaint of palpitation, lightheadedness and shortness of breath.  EKG on arrival showed atrial fibrillation with RVR, heart rate of 161.  She spontaneously converted to sinus  rhythm.  Metoprolol was increased to 75 mg daily.  Eliquis was added to her medical regimen.  Due to persistent intermittent chest discomfort, it was recommended for her to proceed with cardiac PET stress test.  A heart monitor was ordered which showed frequent transient bursts of SVT, patient was asymptomatic, and underlying rhythm was mostly sinus rhythm with only 3% PVC burden.  Patient presents today for follow-up.  Overall she has been doing well.  She does feel a little bit more fatigued after metoprolol was increased.  She has been compliant with Eliquis.  She is due for PET stress test later this month and follow-up with Dr. Stanford Breed in the next month.  Her husband will recently underwent bypass surgery finally started on cardiac rehab.  She says her symptom when she had A-fib was chest pain.  I wonder if her previous chest pain was more driven by A-fib rather than underlying disease.  Either way, I would still recommend a nuclear stress test given the 60% mid LAD lesion seen on previous cardiac catheterization in 2011.  Otherwise, she has no lower extremity edema, orthopnea or PND.  She can follow-up with Dr. Stanford Breed as previously scheduled.  Past Medical History:  Diagnosis Date   Allergy    seasonal   Anemia    prior to hysterectomy, fibroids   Aortic insufficiency    a. 10/2015 Echo: mild to moderate AI.   Aortic stenosis    a. 10/2015 Echo: moderate AS.   Blood transfusion without  reported diagnosis    with hysterectomy   CAD (coronary artery disease)    a. 08/2009 s/p cath after abnormal nuclear study; LM nl, LAD 60%, D2 90% (PCI/DES), LCX 50 -60%, RCA nl, nl EF;  b. 2012 MV: no ischemia/infarct;  c. 08/2015 MV: no ischemia/infarct, EF 70%.   Complication of anesthesia    Diastolic dysfunction    a. 10/2015 Echo: EF 55-60%, no rwma, Gr1 DD, mod AS, mild to mod AI.   Essential hypertension    preventive meds due to heart   H/O: rheumatic fever    Heart murmur    History of  colonoscopy    Hyperlipidemia    preventative med for heart   Osteopenia    last DEXA 11-22-13    PONV (postoperative nausea and vomiting)     Past Surgical History:  Procedure Laterality Date   ANGIOPLASTY  08/2009   stents   AORTIC ARCH ANGIOGRAPHY N/A 03/29/2018   Procedure: AORTIC ARCH ANGIOGRAPHY;  Surgeon: Nelva Bush, MD;  Location: [facility];  Service: Cardiovascular;  Laterality: N/A;   BACK SURGERY     11/2020   CHOLECYSTECTOMY  02/2003   COLONOSCOPY  02/02/2016   per Dr. Henrene Pastor, adenomatous polyps, repeat in 5 yrs    INTRAVASCULAR PRESSURE WIRE/FFR STUDY N/A 03/29/2018   Procedure: INTRAVASCULAR PRESSURE WIRE/FFR STUDY;  Surgeon: Nelva Bush, MD;  Location: [facility];  Service: Cardiovascular;  Laterality: N/A;   KNEE ARTHROSCOPY     Rt. Knee   LEFT HEART CATH AND CORONARY ANGIOGRAPHY N/A 03/29/2018   Procedure: LEFT HEART CATH AND CORONARY ANGIOGRAPHY;  Surgeon: Nelva Bush, MD;  Location: [facility];  Service: Cardiovascular;  Laterality: N/A;   LUMBAR LAMINECTOMY/DECOMPRESSION MICRODISCECTOMY Left 01/03/2020   Procedure: Left L2-3 decompression disectomy, Insitu Fusion Lumbar 2-3;  Surgeon: Melina Schools, MD;  Location: [facility];  Service: Orthopedics;  Laterality: Left;   POLYPECTOMY     TONSILLECTOMY     TONSILLECTOMY     VAGINAL HYSTERECTOMY  1994    Current Medications: Current Meds  Medication Sig   albuterol (VENTOLIN HFA) 108 (90 Base) MCG/ACT inhaler INHALE 2 PUFFS INTO THE LUNGS EVERY 4 HOURS AS NEEDED FOR WHEEZING OR SHORTNESS OF BREATH.   apixaban (ELIQUIS) 5 MG TABS tablet Take 1 tablet (5 mg total) by mouth 2 (two) times daily.   aspirin 81 MG EC tablet Take 81 mg by mouth daily.   atorvastatin (LIPITOR) 80 MG tablet TAKE 1 TABLET BY MOUTH EVERY DAY AT 6PM   calcium carbonate (OSCAL) 1500 (600 Ca) MG TABS tablet Take 600 mg of elemental calcium by mouth daily.   cholecalciferol (VITAMIN D) 1000 UNITS tablet Take  1,000 Units by mouth every evening.    isosorbide mononitrate (IMDUR) 30 MG 24 hr tablet TAKE 1 TABLET BY MOUTH EVERY DAY   loratadine (CLARITIN) 10 MG tablet Take 10 mg by mouth daily.    metoprolol succinate (TOPROL-XL) 50 MG 24 hr tablet Take 1.5 tablets (75 mg total) by mouth daily. Take with or immediately following a meal.   nitroGLYCERIN (NITROSTAT) 0.4 MG SL tablet Place 1 tablet (0.4 mg total) under the tongue every 5 (five) minutes as needed. MAX 3 doses   Probiotic Product (PROBIOTIC PO) Take 1 tablet by mouth daily.    vitamin C (ASCORBIC ACID) 500 MG tablet Take 500 mg by mouth at bedtime.   YUVAFEM 10 MCG TABS vaginal tablet Place 10 mcg vaginally 2 (two) times a  week.      Allergies:   Procaine and Procaine hcl   Social History   Socioeconomic History   Marital status: Married    Spouse name: Not on file   Number of children: 2   Years of education: Not on file   Highest education level: Not on file  Occupational History   Occupation: Retired Product manager: RETIRED  Tobacco Use   Smoking status: Former    Types: Cigarettes    Quit date: 04/12/1977    Years since quitting: 44.6   Smokeless tobacco: Never   Tobacco comments:    quit age 32  Vaping Use   Vaping Use: Never used  Substance and Sexual Activity   Alcohol use: Not Currently    Alcohol/week: 0.0 standard drinks of alcohol    Comment: rarely   Drug use: No   Sexual activity: Not Currently  Other Topics Concern   Not on file  Social History Narrative   Not on file   Social Determinants of Health   Financial Resource Strain: Low Risk  (05/25/2021)   Overall Financial Resource Strain (CARDIA)    Difficulty of Paying Living Expenses: Not hard at all  Food Insecurity: No Food Insecurity (05/25/2021)   Hunger Vital Sign    Worried About [place] in the Last Year: Never true    [place] in the Last Year: Never true  Transportation Needs: No Transportation Needs (05/25/2021)    PRAPARE - Hydrologist (Medical): No    Lack of Transportation (Non-Medical): No  Physical Activity: Sufficiently Active (05/25/2021)   Exercise Vital Sign    Days of Exercise per Week: 5 days    Minutes of Exercise per Session: 50 min  Stress: No Stress Concern Present (05/25/2021)   [place]    Feeling of Stress : Not at all  Social Connections: [place] (05/22/2020)   Social Connection and Isolation Panel [NHANES]    Frequency of Communication with Friends and Family: More than three times a week    Frequency of Social Gatherings with Friends and Family: More than three times a week    Attends Religious Services: 1 to 4 times per year    Active Member of [employer] or Organizations: Yes    Attends Music therapist: More than 4 times per year    Marital Status: Married     Family History: The patient's family history includes Heart disease in her brother, father, maternal grandfather, maternal grandmother, mother, paternal grandfather, paternal grandmother, and sister; Ulcerative colitis in her daughter. There is no history of Colon cancer, Colon polyps, Esophageal cancer, Rectal cancer, or Stomach cancer.  ROS:   Please see the history of present illness.     All other systems reviewed and are negative.  EKGs/Labs/Other Studies Reviewed:    The following studies were reviewed today:  Echo 03/09/2021  1. Left ventricular ejection fraction, by estimation, is 65 to 70%. The  left ventricle has normal function. The left ventricle has no regional  wall motion abnormalities. There is mild left ventricular hypertrophy.  Left ventricular diastolic parameters  are indeterminate.   2. Right ventricular systolic function is normal. The right ventricular  size is normal. There is normal pulmonary artery systolic pressure. The  estimated right ventricular systolic pressure is  00.8 mmHg.   3. The mitral valve is normal in structure. Trivial mitral  valve  regurgitation. No evidence of mitral stenosis.   4. The aortic valve is tricuspid. Aortic valve regurgitation is moderate.  Mild to moderate aortic valve stenosis. Vmax 2.6 m/s, MG 16mHg, AVA  1.0cm^2, DI 0.46   5. The inferior vena cava is normal in size with greater than 50%  respiratory variability, suggesting right atrial pressure of 3 mmHg.  EKG:  EKG is ordered today.  The ekg ordered today demonstrates normal sinus rhythm, no significant ST-T wave changes  Recent Labs: 09/23/2021: ALT 21; B Natriuretic Peptide 184.4; BUN 16; Creatinine, Ser 0.85; Hemoglobin 14.6; Magnesium 1.9; Platelets 230; Potassium 3.9; Sodium 141; TSH 4.415  Recent Lipid Panel    Component Value Date/Time   CHOL 121 03/23/2021 0913   CHOL 119 02/02/2021 0849   TRIG 108.0 03/23/2021 0913   HDL 63.40 03/23/2021 0913   HDL 66 02/02/2021 0849   CHOLHDL 2 03/23/2021 0913   VLDL 21.6 03/23/2021 0913   LDLCALC 36 03/23/2021 0913   LDLCALC 39 02/02/2021 0849   LDLCALC 46 02/14/2020 1008     Risk Assessment/Calculations:    CHA2DS2-VASc Score = 5   This indicates a 7.2% annual risk of stroke. The patient's score is based upon: CHF History: 0 HTN History: 1 Diabetes History: 0 Stroke History: 0 Vascular Disease History: 1 Age Score: 2 Gender Score: 1          Physical Exam:    VS:  BP 124/78   Pulse (!) 58   Ht '5\' 5"'$  (1.651 m)   Wt 171 lb 3.2 oz (77.7 kg)   LMP  (LMP Unknown)   SpO2 97%   BMI 28.49 kg/m        Wt Readings from Last 3 Encounters:  11/10/21 171 lb 3.2 oz (77.7 kg)  09/25/21 171 lb (77.6 kg)  09/03/21 172 lb 3.2 oz (78.1 kg)     GEN:  Well nourished, well developed in no acute distress HEENT: Normal NECK: No JVD; No carotid bruits LYMPHATICS: No lymphadenopathy CARDIAC: RRR, no murmurs, rubs, gallops RESPIRATORY:  Clear to auscultation without rales, wheezing or rhonchi  ABDOMEN: Soft,  non-tender, non-distended MUSCULOSKELETAL:  No edema; No deformity  SKIN: Warm and dry NEUROLOGIC:  Alert and oriented x 3 PSYCHIATRIC:  Normal affect   ASSESSMENT:    1. PAF (paroxysmal atrial fibrillation) ([place]   2. Coronary artery disease involving native coronary artery of native heart with angina pectoris ([place]   3. Hyperlipidemia LDL goal <70   4. Essential hypertension    PLAN:    In order of problems listed above:  Paroxysmal atrial fibrillation: Continue Eliquis and metoprolol.  CAD: On aspirin and Lipitor.  Given recent chest discomfort, pending PET stress test  Hyperlipidemia: On Lipitor  Hypertension: Continue current therapy.           Medication Adjustments/Labs and Tests Ordered: Current medicines are reviewed at length with the patient today.  Concerns regarding medicines are outlined above.  Orders Placed This Encounter  Procedures   EKG 12-Lead   No orders of the defined types were placed in this encounter.   Patient Instructions  Medication Instructions:  Your physician recommends that you continue on your current medications as directed. Please refer to the Current Medication list given to you today.  *If you need a refill on your cardiac medications before your next appointment, please call your pharmacy*  Lab Work: NONE ordered at this time of appointment   If you have labs (blood work) drawn  today and your tests are completely normal, you will receive your results only by: MyChart Message (if you have MyChart) OR A paper copy in the mail If you have any lab test that is abnormal or we need to change your treatment, we will call you to review the results.  Testing/Procedures: NONE ordered at this time of appointment   Follow-Up: At [facility], you and your health needs are our priority.  As part of our continuing mission to provide you with exceptional heart care, we have created designated Provider Care Teams.  These Care Teams  include your primary Cardiologist (physician) and Advanced Practice Providers (APPs -  Physician Assistants and Nurse Practitioners) who all work together to provide you with the care you need, when you need it.    Your next appointment:   As previously scheduled   The format for your next appointment:   In Person  Provider:   Kirk Ruths, MD     Other Instructions   Important Information About Sugar         Signed, Almyra Deforest, [place]  11/12/2021 1:23 PM    [place]

## 2021-11-12 ENCOUNTER — Encounter: Payer: Self-pay | Admitting: Physician Assistant

## 2021-11-21 ENCOUNTER — Other Ambulatory Visit: Payer: Self-pay | Admitting: Cardiology

## 2021-11-23 ENCOUNTER — Other Ambulatory Visit: Payer: Self-pay

## 2021-11-23 MED ORDER — APIXABAN 5 MG PO TABS: 5.0000 mg | ORAL_TABLET | Freq: Two times a day (BID) | ORAL | 1 refills | Status: DC

## 2021-11-23 NOTE — Telephone Encounter (Signed)
Prescription refill request for Eliquis received. Indication:Afib Last office visit:8/23 Scr:0.8 Age: 76 Weight:77.7 kg  Prescription refilled

## 2021-11-30 ENCOUNTER — Telehealth (HOSPITAL_COMMUNITY): Payer: Self-pay | Admitting: Emergency Medicine

## 2021-11-30 NOTE — Telephone Encounter (Signed)
Attempted to call patient regarding upcoming cardiac PET appointment. Left message on voicemail with name and callback number Martita Brumm RN Navigator Cardiac Imaging [facility] [phone] Office [phone] Cell  

## 2021-12-01 ENCOUNTER — Encounter (HOSPITAL_COMMUNITY)
Admission: RE | Admit: 2021-12-01 | Discharge: 2021-12-01 | Disposition: A | Discharge status: Home / Self Care | Payer: Medicare Other | Source: Ambulatory Visit | Attending: Physician Assistant | Admitting: Physician Assistant

## 2021-12-01 DIAGNOSIS — R072 Precordial pain: Secondary | ICD-10-CM | POA: Diagnosis present

## 2021-12-01 LAB — NM PET CT CARDIAC PERFUSION MULTI W/ABSOLUTE BLOODFLOW
LV dias vol: 68 mL (ref 46–106)
LV sys vol: 19 mL
MBFR: 3.07
Nuc Rest EF: 71 %
Nuc Stress EF: 75 %
Rest MBF: 0.89 ml/g/min
Rest Nuclear Isotope Dose: 20.1 mCi
Stress MBF: 2.73 ml/g/min
Stress Nuclear Isotope Dose: 20.1 mCi
TID: 0.97

## 2021-12-01 MED ORDER — RUBIDIUM RB82 GENERATOR (RUBYFILL)
20.0600 | PACK | Freq: Once | INTRAVENOUS | Status: AC
  Administered 2021-12-01: 20.06 via INTRAVENOUS

## 2021-12-01 MED ORDER — REGADENOSON 0.4 MG/5ML IV SOLN
INTRAVENOUS | Status: AC
  Administered 2021-12-01: 0.4 mg via INTRAVENOUS
  Filled 2021-12-01: qty 5

## 2021-12-01 MED ORDER — REGADENOSON 0.4 MG/5ML IV SOLN: 0.4000 mg | Freq: Once | INTRAVENOUS | Status: AC

## 2021-12-01 NOTE — Progress Notes (Signed)
Patient significant ST depression in inferior lateral leads with dyspnea during cardiac PET. She was stable and improved segment during my evaluation. No chest pain. Reviewed prior cath.   Given improved ST changes and resolved symptoms, will let her go home. Will prioritize reading the study. She will call us or go to ER if recurrent/worsen symptoms.

## 2021-12-07 NOTE — Progress Notes (Signed)
HPI: FU CAD and atrial fibrillation. Cardiac catheterization was performed in May of 2011. This revealed Normal LM, 60 LAD, 90 D2, 50-60 LCx, normal RCA, and nl LV function. Patient had PCI of the diagonal with DES at that time. Carotid Dopplers in Oct 2013 were normal. Cardiac catheterization December 2019 showed nonobstructive coronary disease with moderate mid LAD and moderate in-stent restenosis in second diagonal and no disease in the circumflex or right coronary artery. LV function was normal with normal LV filling pressures. The ascending aorta was mildly dilated. CTA August 2021 showed no evidence of thoracic aortic aneurysm.  Scattered pulmonary nodules unchanged and no follow-up recommended.  Monitor April 2022 showed sinus rhythm with occasional PAC, brief PAT and PVC.  Echocardiogram November 2022 showed normal LV function, mild left ventricular hypertrophy, moderate aortic insufficiency, mild to moderate aortic stenosis with mean gradient 15 mmHg.  Found to have atrial fibrillation June 2023.  Metoprolol was increased and apixaban added to her medications.  Monitor July 2023 showed sinus rhythm with PACs, brief runs of PAT, occasional PVC and couplet.  PET scan August 2023 showed ejection fraction 71% and normal perfusion.  Since I last saw her, she denies dyspnea, chest pain, palpitations or syncope.  No bleeding.  Current Outpatient Medications  Medication Sig Dispense Refill   albuterol (VENTOLIN HFA) 108 (90 Base) MCG/ACT inhaler INHALE 2 PUFFS INTO THE LUNGS EVERY 4 HOURS AS NEEDED FOR WHEEZING OR SHORTNESS OF BREATH. 18 each 3   apixaban (ELIQUIS) 5 MG TABS tablet Take 1 tablet (5 mg total) by mouth 2 (two) times daily. 180 tablet 1   aspirin 81 MG EC tablet Take 81 mg by mouth daily.     atorvastatin (LIPITOR) 80 MG tablet TAKE 1 TABLET BY MOUTH EVERY DAY AT 6PM 90 tablet 1   calcium carbonate (OSCAL) 1500 (600 Ca) MG TABS tablet Take 600 mg of elemental calcium by mouth daily.      cholecalciferol (VITAMIN D) 1000 UNITS tablet Take 1,000 Units by mouth every evening.      isosorbide mononitrate (IMDUR) 30 MG 24 hr tablet TAKE 1 TABLET BY MOUTH EVERY DAY 90 tablet 1   loratadine (CLARITIN) 10 MG tablet Take 10 mg by mouth daily.      metoprolol succinate (TOPROL-XL) 50 MG 24 hr tablet Take 1.5 tablets (75 mg total) by mouth daily. Take with or immediately following a meal. 135 tablet 3   nitroGLYCERIN (NITROSTAT) 0.4 MG SL tablet Place 1 tablet (0.4 mg total) under the tongue every 5 (five) minutes as needed. MAX 3 doses 25 tablet 3   Probiotic Product (PROBIOTIC PO) Take 1 tablet by mouth daily.      vitamin C (ASCORBIC ACID) 500 MG tablet Take 500 mg by mouth at bedtime.     YUVAFEM 10 MCG TABS vaginal tablet Place 10 mcg vaginally 2 (two) times a week.   1   No current facility-administered medications for this visit.     Past Medical History:  Diagnosis Date   Allergy    seasonal   Anemia    prior to hysterectomy, fibroids   Aortic insufficiency    a. 10/2015 Echo: mild to moderate AI.   Aortic stenosis    a. 10/2015 Echo: moderate AS.   Blood transfusion without reported diagnosis    with hysterectomy   CAD (coronary artery disease)    a. 08/2009 s/p cath after abnormal nuclear study; LM nl, LAD 60%, D2 90% (PCI/DES), LCX  50 -60%, RCA nl, nl EF;  b. 2012 MV: no ischemia/infarct;  c. 08/2015 MV: no ischemia/infarct, EF 70%.   Complication of anesthesia    Diastolic dysfunction    a. 10/2015 Echo: EF 55-60%, no rwma, Gr1 DD, mod AS, mild to mod AI.   Essential hypertension    preventive meds due to heart   H/O: rheumatic fever    Heart murmur    History of colonoscopy    Hyperlipidemia    preventative med for heart   Osteopenia    last DEXA 11-22-13    PONV (postoperative nausea and vomiting)     Past Surgical History:  Procedure Laterality Date   ANGIOPLASTY  08/2009   stents   AORTIC ARCH ANGIOGRAPHY N/A 03/29/2018   Procedure: AORTIC ARCH  ANGIOGRAPHY;  Surgeon: Nelva Bush, MD;  Location: [facility];  Service: Cardiovascular;  Laterality: N/A;   BACK SURGERY     11/2020   CHOLECYSTECTOMY  02/2003   COLONOSCOPY  02/02/2016   per Dr. Henrene Pastor, adenomatous polyps, repeat in 5 yrs    INTRAVASCULAR PRESSURE WIRE/FFR STUDY N/A 03/29/2018   Procedure: INTRAVASCULAR PRESSURE WIRE/FFR STUDY;  Surgeon: Nelva Bush, MD;  Location: [facility];  Service: Cardiovascular;  Laterality: N/A;   KNEE ARTHROSCOPY     Rt. Knee   LEFT HEART CATH AND CORONARY ANGIOGRAPHY N/A 03/29/2018   Procedure: LEFT HEART CATH AND CORONARY ANGIOGRAPHY;  Surgeon: Nelva Bush, MD;  Location: [facility];  Service: Cardiovascular;  Laterality: N/A;   LUMBAR LAMINECTOMY/DECOMPRESSION MICRODISCECTOMY Left 01/03/2020   Procedure: Left L2-3 decompression disectomy, Insitu Fusion Lumbar 2-3;  Surgeon: Melina Schools, MD;  Location: [facility];  Service: Orthopedics;  Laterality: Left;   POLYPECTOMY     TONSILLECTOMY     TONSILLECTOMY     VAGINAL HYSTERECTOMY  1994    Social History   Socioeconomic History   Marital status: Married    Spouse name: Not on file   Number of children: 2   Years of education: Not on file   Highest education level: Not on file  Occupational History   Occupation: Retired Product manager: RETIRED  Tobacco Use   Smoking status: Former    Types: Cigarettes    Quit date: 04/12/1977    Years since quitting: 44.7   Smokeless tobacco: Never   Tobacco comments:    quit age 89  Vaping Use   Vaping Use: Never used  Substance and Sexual Activity   Alcohol use: Not Currently    Alcohol/week: 0.0 standard drinks of alcohol    Comment: rarely   Drug use: No   Sexual activity: Not Currently  Other Topics Concern   Not on file  Social History Narrative   Not on file   Social Determinants of Health   Financial Resource Strain: Low Risk  (05/25/2021)   Overall Financial Resource Strain (CARDIA)     Difficulty of Paying Living Expenses: Not hard at all  Food Insecurity: No Food Insecurity (05/25/2021)   Hunger Vital Sign    Worried About Running Out of Food in the Last Year: Never true    [place] in the Last Year: Never true  Transportation Needs: No Transportation Needs (05/25/2021)   PRAPARE - Hydrologist (Medical): No    Lack of Transportation (Non-Medical): No  Physical Activity: Sufficiently Active (05/25/2021)   Exercise Vital Sign    Days of Exercise per Week:  5 days    Minutes of Exercise per Session: 50 min  Stress: No Stress Concern Present (05/25/2021)   [place]    Feeling of Stress : Not at all  Social Connections: [place] (05/22/2020)   Social Connection and Isolation Panel [NHANES]    Frequency of Communication with Friends and Family: More than three times a week    Frequency of Social Gatherings with Friends and Family: More than three times a week    Attends Religious Services: 1 to 4 times per year    Active Member of [employer] or Organizations: Yes    Attends Music therapist: More than 4 times per year    Marital Status: Married  Human resources officer Violence: Not At Risk (05/22/2020)   Humiliation, Afraid, Rape, and Kick questionnaire    Fear of Current or Ex-Partner: No    Emotionally Abused: No    Physically Abused: No    Sexually Abused: No    Family History  Problem Relation Age of Onset   Heart disease Mother    Heart disease Father        CABG   Heart disease Sister    Heart disease Brother        2 stents   Heart disease Maternal Grandmother    Heart disease Maternal Grandfather    Heart disease Paternal Grandmother    Heart disease Paternal Grandfather    Ulcerative colitis Daughter    Colon cancer Neg Hx    Colon polyps Neg Hx    Esophageal cancer Neg Hx    Rectal cancer Neg Hx    Stomach cancer Neg Hx     ROS: no  fevers or chills, productive cough, hemoptysis, dysphasia, odynophagia, melena, hematochezia, dysuria, hematuria, rash, seizure activity, orthopnea, PND, pedal edema, claudication. Remaining systems are negative.  Physical Exam: Well-developed well-nourished in no acute distress.  Skin is warm and dry.  HEENT is normal.  Neck is supple.  Chest is clear to auscultation with normal expansion.  Cardiovascular exam is regular rate and rhythm.  Abdominal exam nontender or distended. No masses palpated. Extremities show no edema. neuro grossly intact  A/P  1 paroxysmal atrial fibrillation-patient remains in sinus rhythm.  Continue metoprolol and apixaban at present dose.  2 aortic stenosis/aortic insufficiency-plan repeat echocardiogram November 2023.  She understands she will likely require aortic valve replacement in the future.  3 coronary artery disease-she denies chest pain.  Recent PET scan showed no ischemia.  Plan to continue medical therapy.  Continue statin. DC aspirin given need for apixaban.  4 hyperlipidemia-continue statin.  5 hypertension-patient's blood pressure is controlled.  Continue present medical regimen.  Kirk Ruths, MD

## 2021-12-21 ENCOUNTER — Ambulatory Visit: Payer: Medicare Other | Attending: Cardiology | Admitting: Cardiology

## 2021-12-21 ENCOUNTER — Encounter: Payer: Self-pay | Admitting: Cardiology

## 2021-12-21 VITALS — BP 120/70 | HR 60 | Ht 65.0 in | Wt 170.2 lb

## 2021-12-21 DIAGNOSIS — I25119 Atherosclerotic heart disease of native coronary artery with unspecified angina pectoris: Secondary | ICD-10-CM

## 2021-12-21 DIAGNOSIS — I350 Nonrheumatic aortic (valve) stenosis: Secondary | ICD-10-CM | POA: Diagnosis not present

## 2021-12-21 DIAGNOSIS — I480 Paroxysmal atrial fibrillation: Secondary | ICD-10-CM | POA: Diagnosis not present

## 2021-12-21 DIAGNOSIS — I10 Essential (primary) hypertension: Secondary | ICD-10-CM

## 2021-12-21 NOTE — Patient Instructions (Signed)
Medication Instructions:   STOP ASPIRIN  *If you need a refill on your cardiac medications before your next appointment, please call your pharmacy*   Testing/Procedures:  Your physician has requested that you have an echocardiogram. Echocardiography is a painless test that uses sound waves to create images of your heart. It provides your doctor with information about the size and shape of your heart and how well your heart's chambers and valves are working. This procedure takes approximately one hour. There are no restrictions for this procedure. [place], you and your health needs are our priority.  As part of our continuing mission to provide you with exceptional heart care, we have created designated Provider Care Teams.  These Care Teams include your primary Cardiologist (physician) and Advanced Practice Providers (APPs -  Physician Assistants and Nurse Practitioners) who all work together to provide you with the care you need, when you need it.  We recommend signing up for the patient portal called "MyChart".  Sign up information is provided on this After Visit Summary.  MyChart is used to connect with patients for Virtual Visits (Telemedicine).  Patients are able to view lab/test results, encounter notes, upcoming appointments, etc.  Non-urgent messages can be sent to your provider as well.   To learn more about what you can do with MyChart, go to [email or web address].    Your next appointment:   6 month(s)  The format for your next appointment:   In Person  Provider:   Kirk Ruths, MD

## 2022-01-10 ENCOUNTER — Other Ambulatory Visit: Payer: Self-pay | Admitting: Cardiology

## 2022-02-03 ENCOUNTER — Telehealth: Payer: Self-pay | Admitting: Cardiology

## 2022-02-03 ENCOUNTER — Other Ambulatory Visit: Payer: Self-pay | Admitting: Cardiology

## 2022-02-03 MED ORDER — NITROGLYCERIN 0.4 MG SL SUBL: 0.4000 mg | SUBLINGUAL_TABLET | SUBLINGUAL | 3 refills | Status: AC | PRN

## 2022-02-03 NOTE — Telephone Encounter (Signed)
*  STAT* If patient is at the pharmacy, call can be transferred to refill team.   1. Which medications need to be refilled? (please list name of each medication and dose if known)  nitroGLYCERIN (NITROSTAT) 0.4 MG SL tablet     2. Which pharmacy/location (including street and city if local pharmacy) is medication to be sent to? [id number]- [place], [place] - [address]  3. Do they need a 30 day or 90 day supply?   Patient needs a standard supply to take as needed. Most recent prescription is expired.

## 2022-02-05 ENCOUNTER — Encounter: Payer: Self-pay | Admitting: Family Medicine

## 2022-02-05 ENCOUNTER — Ambulatory Visit (INDEPENDENT_AMBULATORY_CARE_PROVIDER_SITE_OTHER): Payer: Medicare Other | Admitting: Family Medicine

## 2022-02-05 VITALS — BP 118/62 | HR 58 | Temp 97.6°F | Wt 170.0 lb

## 2022-02-05 DIAGNOSIS — R519 Headache, unspecified: Secondary | ICD-10-CM | POA: Diagnosis not present

## 2022-02-05 DIAGNOSIS — J020 Streptococcal pharyngitis: Secondary | ICD-10-CM | POA: Diagnosis not present

## 2022-02-05 DIAGNOSIS — J029 Acute pharyngitis, unspecified: Secondary | ICD-10-CM

## 2022-02-05 DIAGNOSIS — R0989 Other specified symptoms and signs involving the circulatory and respiratory systems: Secondary | ICD-10-CM | POA: Diagnosis not present

## 2022-02-05 DIAGNOSIS — I25119 Atherosclerotic heart disease of native coronary artery with unspecified angina pectoris: Secondary | ICD-10-CM | POA: Diagnosis not present

## 2022-02-05 LAB — POC COVID19 BINAXNOW: SARS Coronavirus 2 Ag: NEGATIVE

## 2022-02-05 LAB — POCT RAPID STREP A (OFFICE): Rapid Strep A Screen: POSITIVE — AB

## 2022-02-05 MED ORDER — CEFUROXIME AXETIL 500 MG PO TABS: 500.0000 mg | ORAL_TABLET | Freq: Two times a day (BID) | ORAL | 0 refills | Status: AC

## 2022-02-05 NOTE — Progress Notes (Signed)
   Subjective:    Patient ID: Anne Martin, female    DOB: 1945-07-09, 76 y.o.   MRN: [id number]  HPI Here for 3 days of runny nose, stuffy head, PND, and ST. No fever or cough. Using saline nasal sprays.    Review of Systems  Constitutional: Negative.   HENT:  Positive for congestion, ear pain, postnasal drip, sinus pressure and sore throat.   Eyes: Negative.   Respiratory: Negative.         Objective:   Physical Exam Constitutional:      Appearance: Normal appearance. She is not ill-appearing.  HENT:     Right Ear: Tympanic membrane, ear canal and external ear normal.     Left Ear: Tympanic membrane, ear canal and external ear normal.     Nose: Nose normal.     Mouth/Throat:     Pharynx: Oropharynx is clear.  Eyes:     Conjunctiva/sclera: Conjunctivae normal.  Pulmonary:     Effort: Pulmonary effort is normal.     Breath sounds: Normal breath sounds.  Lymphadenopathy:     Cervical: No cervical adenopathy.  Neurological:     Mental Status: She is alert.           Assessment & Plan:  Strep pharyngitis. Treat with 10 days of Cefuroxime.  Alysia Penna, MD

## 2022-03-08 ENCOUNTER — Ambulatory Visit (HOSPITAL_COMMUNITY): Payer: Medicare Other | Attending: Cardiology

## 2022-03-08 DIAGNOSIS — I350 Nonrheumatic aortic (valve) stenosis: Secondary | ICD-10-CM | POA: Insufficient documentation

## 2022-03-08 LAB — ECHOCARDIOGRAM COMPLETE
AR max vel: 1.22 cm2
AV Area VTI: 1.1 cm2
AV Area mean vel: 1.11 cm2
AV Mean grad: 13 mmHg
AV Peak grad: 24.2 mmHg
Ao pk vel: 2.46 m/s
Area-P 1/2: 2.62 cm2
P 1/2 time: 1029 msec
S' Lateral: 2.2 cm

## 2022-03-12 ENCOUNTER — Ambulatory Visit
Admission: RE | Admit: 2022-03-12 | Discharge: 2022-03-12 | Disposition: A | Discharge status: Home / Self Care | Payer: Medicare Other | Source: Ambulatory Visit | Attending: Obstetrics | Admitting: Obstetrics

## 2022-03-12 DIAGNOSIS — M8580 Other specified disorders of bone density and structure, unspecified site: Secondary | ICD-10-CM

## 2022-03-17 ENCOUNTER — Encounter: Payer: Self-pay | Admitting: Family Medicine

## 2022-03-17 ENCOUNTER — Ambulatory Visit (INDEPENDENT_AMBULATORY_CARE_PROVIDER_SITE_OTHER): Payer: Medicare Other | Admitting: Family Medicine

## 2022-03-17 VITALS — BP 110/62 | HR 59 | Temp 97.6°F | Wt 170.0 lb

## 2022-03-17 DIAGNOSIS — H9201 Otalgia, right ear: Secondary | ICD-10-CM

## 2022-03-17 DIAGNOSIS — I25119 Atherosclerotic heart disease of native coronary artery with unspecified angina pectoris: Secondary | ICD-10-CM | POA: Diagnosis not present

## 2022-03-17 NOTE — Progress Notes (Signed)
   Subjective:    Patient ID: Anne Martin, female    DOB: 04/03/1946, 76 y.o.   MRN: [id number]  HPI Here for 3 days of pain in the right ear that suddenly went away this morning. She denies any other symptoms. She feels fine right now.    Review of Systems  Constitutional: Negative.   HENT:  Positive for ear pain. Negative for congestion, facial swelling, hearing loss, postnasal drip, sinus pressure and sore throat.   Eyes: Negative.   Respiratory: Negative.         Objective:   Physical Exam Constitutional:      Appearance: Normal appearance. She is not ill-appearing.  HENT:     Right Ear: Tympanic membrane, ear canal and external ear normal.     Left Ear: Tympanic membrane, ear canal and external ear normal.     Nose: Nose normal.     Mouth/Throat:     Pharynx: Oropharynx is clear.  Eyes:     Conjunctiva/sclera: Conjunctivae normal.  Pulmonary:     Effort: Pulmonary effort is normal.     Breath sounds: Normal breath sounds.  Lymphadenopathy:     Cervical: No cervical adenopathy.  Neurological:     Mental Status: She is alert.           Assessment & Plan:  Right ear pain, most likely caused by eustachian tube dysfunction. She already takes Claritin D every day, so I told her if this pain returns she should add Mucinex 1200 mg BID to the regimen.  Alysia Penna, MD

## 2022-03-24 ENCOUNTER — Ambulatory Visit (INDEPENDENT_AMBULATORY_CARE_PROVIDER_SITE_OTHER): Payer: Medicare Other | Admitting: Family Medicine

## 2022-03-24 ENCOUNTER — Encounter: Payer: Self-pay | Admitting: Family Medicine

## 2022-03-24 VITALS — BP 118/68 | HR 56 | Temp 97.4°F | Ht 65.0 in | Wt 170.0 lb

## 2022-03-24 DIAGNOSIS — E559 Vitamin D deficiency, unspecified: Secondary | ICD-10-CM | POA: Diagnosis not present

## 2022-03-24 DIAGNOSIS — J020 Streptococcal pharyngitis: Secondary | ICD-10-CM | POA: Diagnosis not present

## 2022-03-24 DIAGNOSIS — R748 Abnormal levels of other serum enzymes: Secondary | ICD-10-CM

## 2022-03-24 DIAGNOSIS — I2510 Atherosclerotic heart disease of native coronary artery without angina pectoris: Secondary | ICD-10-CM | POA: Diagnosis not present

## 2022-03-24 DIAGNOSIS — I10 Essential (primary) hypertension: Secondary | ICD-10-CM

## 2022-03-24 DIAGNOSIS — M8580 Other specified disorders of bone density and structure, unspecified site: Secondary | ICD-10-CM

## 2022-03-24 DIAGNOSIS — M5126 Other intervertebral disc displacement, lumbar region: Secondary | ICD-10-CM

## 2022-03-24 DIAGNOSIS — I480 Paroxysmal atrial fibrillation: Secondary | ICD-10-CM

## 2022-03-24 DIAGNOSIS — I350 Nonrheumatic aortic (valve) stenosis: Secondary | ICD-10-CM | POA: Diagnosis not present

## 2022-03-24 DIAGNOSIS — E7800 Pure hypercholesterolemia, unspecified: Secondary | ICD-10-CM

## 2022-03-24 DIAGNOSIS — R739 Hyperglycemia, unspecified: Secondary | ICD-10-CM | POA: Diagnosis not present

## 2022-03-24 DIAGNOSIS — I25119 Atherosclerotic heart disease of native coronary artery with unspecified angina pectoris: Secondary | ICD-10-CM | POA: Diagnosis not present

## 2022-03-24 LAB — CBC WITH DIFFERENTIAL/PLATELET
Basophils Absolute: 0.1 10*3/uL (ref 0.0–0.1)
Basophils Relative: 0.7 % (ref 0.0–3.0)
Eosinophils Absolute: 0.2 10*3/uL (ref 0.0–0.7)
Eosinophils Relative: 2.4 % (ref 0.0–5.0)
HCT: 46.7 % — ABNORMAL HIGH (ref 36.0–46.0)
Hemoglobin: 15.3 g/dL — ABNORMAL HIGH (ref 12.0–15.0)
Lymphocytes Relative: 29.2 % (ref 12.0–46.0)
Lymphs Abs: 2.4 10*3/uL (ref 0.7–4.0)
MCHC: 32.6 g/dL (ref 30.0–36.0)
MCV: 79.7 fl (ref 78.0–100.0)
Monocytes Absolute: 0.7 10*3/uL (ref 0.1–1.0)
Monocytes Relative: 8.3 % (ref 3.0–12.0)
Neutro Abs: 5 10*3/uL (ref 1.4–7.7)
Neutrophils Relative %: 59.4 % (ref 43.0–77.0)
Platelets: 239 10*3/uL (ref 150.0–400.0)
RBC: 5.86 Mil/uL — ABNORMAL HIGH (ref 3.87–5.11)
RDW: 15.1 % (ref 11.5–15.5)
WBC: 8.3 10*3/uL (ref 4.0–10.5)

## 2022-03-24 LAB — LIPID PANEL
Cholesterol: 135 mg/dL (ref 0–200)
HDL: 65.6 mg/dL (ref >39.00)
LDL Cholesterol: 52 mg/dL (ref 0–99)
NonHDL: 69.73
Total CHOL/HDL Ratio: 2
Triglycerides: 88 mg/dL (ref 0.0–149.0)
VLDL: 17.6 mg/dL (ref 0.0–40.0)

## 2022-03-24 LAB — BASIC METABOLIC PANEL
BUN: 16 mg/dL (ref 6–23)
CO2: 30 mEq/L (ref 19–32)
Calcium: 10.2 mg/dL (ref 8.4–10.5)
Chloride: 101 mEq/L (ref 96–112)
Creatinine, Ser: 0.82 mg/dL (ref 0.40–1.20)
GFR: 69.16 mL/min (ref >60.00)
Glucose, Bld: 93 mg/dL (ref 70–99)
Potassium: 4.2 mEq/L (ref 3.5–5.1)
Sodium: 139 mEq/L (ref 135–145)

## 2022-03-24 LAB — HEPATIC FUNCTION PANEL
ALT: 27 U/L (ref 0–35)
AST: 31 U/L (ref 0–37)
Albumin: 4.4 g/dL (ref 3.5–5.2)
Alkaline Phosphatase: 174 U/L — ABNORMAL HIGH (ref 39–117)
Bilirubin, Direct: 0.2 mg/dL (ref 0.0–0.3)
Total Bilirubin: 1 mg/dL (ref 0.2–1.2)
Total Protein: 7.2 g/dL (ref 6.0–8.3)

## 2022-03-24 LAB — HEMOGLOBIN A1C: Hgb A1c MFr Bld: 5.9 % (ref 4.6–6.5)

## 2022-03-24 LAB — VITAMIN D 25 HYDROXY (VIT D DEFICIENCY, FRACTURES): VITD: 91.36 ng/mL (ref 30.00–100.00)

## 2022-03-24 LAB — TSH: TSH: 3.39 u[IU]/mL (ref 0.35–5.50)

## 2022-03-24 MED ORDER — AMOXICILLIN-POT CLAVULANATE 875-125 MG PO TABS: 1.0000 | ORAL_TABLET | Freq: Two times a day (BID) | ORAL | 0 refills | Status: DC

## 2022-03-24 NOTE — Progress Notes (Signed)
Subjective:    Patient ID: Anne Martin, female    DOB: 03-11-1946, 76 y.o.   MRN: [id number]  HPI Here to follow up on issues. She feels well except for a persistent mild ST and intermittent pain in the right ear. No fever or coughing. We saw her on 02-05-22 when she tested positive for strep, and we treated her with 10 days of Cefuroxime. She felt better for awhile, but the symptoms returned while she was in [place], [place]. On 02-20-22 she saw an urgent care there and was given a course of Amoxicillin. Again she felt a little better, but the symptoms persist. Otherwise she is doing well. Her GYN recently sent her for a DEXA that showed osteopenia, and she started her on Alendronate. She saw Dr. Stanford Breed on 03-08-22 for her yearly visit, and she had an ECHO which showed her aortic valve mild to moderate stenosis to be stable. She remains on Eliquis for a single bout of atrial fibrillation. Her BP is stable.    Review of Systems  Constitutional: Negative.   HENT:  Positive for ear pain and sore throat.   Eyes: Negative.   Respiratory: Negative.    Cardiovascular: Negative.   Gastrointestinal: Negative.   Genitourinary:  Negative for decreased urine volume, difficulty urinating, dyspareunia, dysuria, enuresis, flank pain, frequency, hematuria, pelvic pain and urgency.  Musculoskeletal: Negative.   Skin: Negative.   Neurological: Negative.  Negative for headaches.  Psychiatric/Behavioral: Negative.         Objective:   Physical Exam Constitutional:      General: She is not in acute distress.    Appearance: Normal appearance. She is well-developed.  HENT:     Head: Normocephalic and atraumatic.     Right Ear: External ear normal.     Left Ear: External ear normal.     Nose: Nose normal.     Mouth/Throat:     Pharynx: No oropharyngeal exudate.  Eyes:     General: No scleral icterus.    Conjunctiva/sclera: Conjunctivae normal.     Pupils: Pupils are equal, round, and reactive to  light.  Neck:     Thyroid: No thyromegaly.     Vascular: No JVD.     Comments: Mildly enlarged right AC nodes that are tender  Cardiovascular:     Rate and Rhythm: Normal rate and regular rhythm.     Pulses: Normal pulses.     Heart sounds: No murmur heard.    No friction rub. No gallop.     Comments: Has her usual 2/6 SM  Pulmonary:     Effort: Pulmonary effort is normal. No respiratory distress.     Breath sounds: Normal breath sounds. No wheezing or rales.  Chest:     Chest wall: No tenderness.  Abdominal:     General: Bowel sounds are normal. There is no distension.     Palpations: Abdomen is soft. There is no mass.     Tenderness: There is no abdominal tenderness. There is no guarding or rebound.  Musculoskeletal:        General: No tenderness. Normal range of motion.     Cervical back: Normal range of motion and neck supple.  Lymphadenopathy:     Cervical: No cervical adenopathy.  Skin:    General: Skin is warm and dry.     Findings: No erythema or rash.  Neurological:     General: No focal deficit present.     Mental Status: She is alert  and oriented to person, place, and time.     Cranial Nerves: No cranial nerve deficit.     Motor: No abnormal muscle tone.     Coordination: Coordination normal.     Deep Tendon Reflexes: Reflexes are normal and symmetric. Reflexes normal.  Psychiatric:        Behavior: Behavior normal.        Thought Content: Thought content normal.        Judgment: Judgment normal.           Assessment & Plan:  She is doing well as far as PAF, CAD, and AV stenosis go. Her HTN is stable. She is now being treated for osteopenia. She seems to have a residual strep infection, so we will treat this with 10 days of Augmentin. Get fasting labs for lipids, etc. We spent a total of (34   ) minutes reviewing records and discussing these issues.  Alysia Penna, MD

## 2022-03-25 NOTE — Addendum Note (Signed)
Addended by: Alysia Penna A on: 03/25/2022 10:32 AM   Modules accepted: Orders

## 2022-04-01 ENCOUNTER — Telehealth: Payer: Self-pay | Admitting: Family Medicine

## 2022-04-01 NOTE — Telephone Encounter (Signed)
Patient will be in [place] for the winter and requesting referral [phone]  be re-routed to [facility] fax [phone]

## 2022-04-02 ENCOUNTER — Other Ambulatory Visit (HOSPITAL_COMMUNITY): Payer: Medicare Other

## 2022-04-06 ENCOUNTER — Encounter: Payer: Self-pay | Admitting: Family Medicine

## 2022-04-06 NOTE — Telephone Encounter (Signed)
Re routed and sent referral via releases.

## 2022-04-08 ENCOUNTER — Encounter: Payer: Self-pay | Admitting: Family Medicine

## 2022-04-08 NOTE — Telephone Encounter (Signed)
Pt called to ask for a call back from the referral coordinator, as she has questions and some concerns

## 2022-04-08 NOTE — Telephone Encounter (Signed)
Pt called to ask for a call back from the referral coordinator, as she has questions and some concerns.

## 2022-04-09 ENCOUNTER — Other Ambulatory Visit: Payer: Self-pay | Admitting: Cardiology

## 2022-04-09 ENCOUNTER — Other Ambulatory Visit: Payer: Self-pay | Admitting: Internal Medicine

## 2022-04-09 DIAGNOSIS — Z86010 Personal history of colonic polyps: Secondary | ICD-10-CM

## 2022-04-09 NOTE — Telephone Encounter (Signed)
Noted  

## 2022-04-09 NOTE — Telephone Encounter (Signed)
Yes she and her husband should get the RSV vaccine. As for the facility fax number, where is it?

## 2022-04-10 ENCOUNTER — Other Ambulatory Visit: Payer: Self-pay | Admitting: Cardiology

## 2022-04-16 ENCOUNTER — Telehealth: Payer: Self-pay

## 2022-04-16 NOTE — Telephone Encounter (Signed)
[place] order was faxed to [facility] requested by pt

## 2022-05-04 ENCOUNTER — Encounter: Payer: Self-pay | Admitting: Cardiology

## 2022-05-04 NOTE — Telephone Encounter (Signed)
error 

## 2022-05-14 LAB — HM MAMMOGRAPHY

## 2022-05-26 IMAGING — CT CT ANGIO CHEST
1 series · 19 of 32 positions shown · IV contrast (iopamidol)
Comparison: CTA chest dated November 28, 2018.

CLINICAL DATA: Thoracic aortic aneurysm follow-up.

EXAM:
CT ANGIOGRAPHY CHEST WITH CONTRAST
TECHNIQUE: Multidetector CT imaging of the chest was performed using the
standard protocol during bolus administration of intravenous
contrast. Multiplanar CT image reconstructions and MIPs were
obtained to evaluate the vascular anatomy.
CONTRAST:  75mL YHFK3X-S01 IOPAMIDOL (YHFK3X-S01) INJECTION 76%

[Series 4: chest angio · axial · 0.67mm/px · z∈[-280,+2]mm · 19 of 102 slices shown]
[im 4/102  lung]
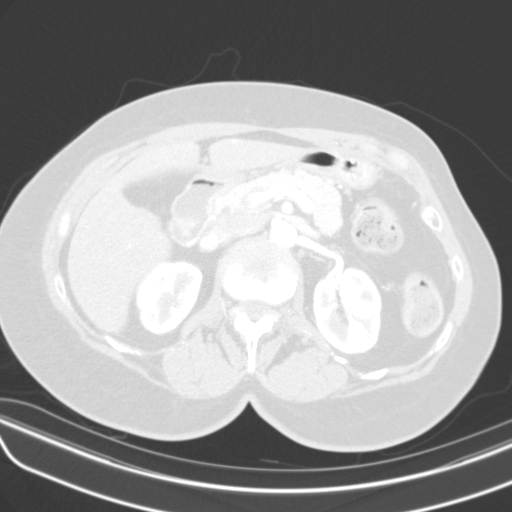
[im 10/102  soft-tissue]
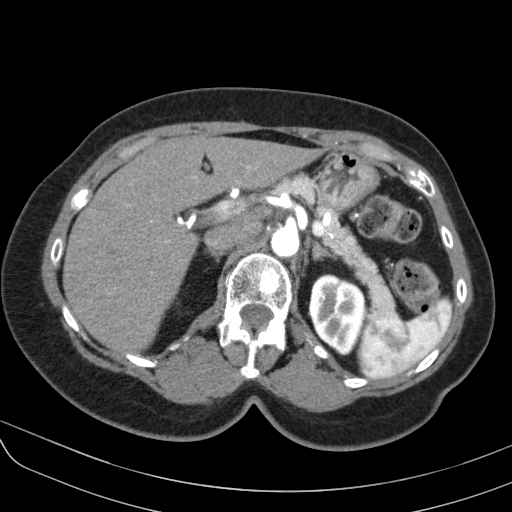
[im 14/102  lung]
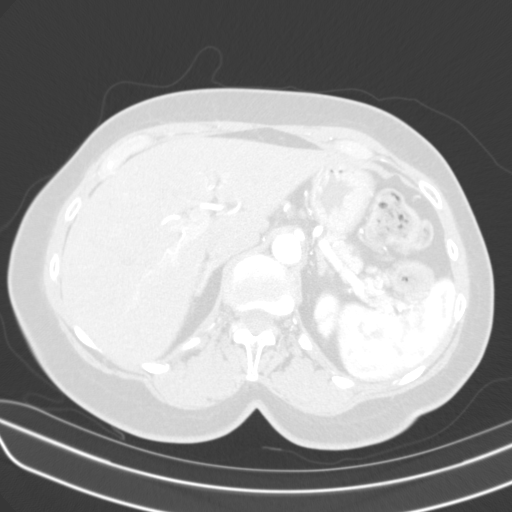
[im 20/102  soft-tissue]
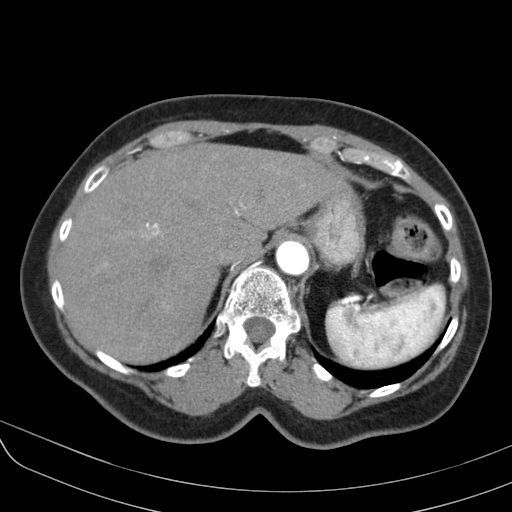
[im 27/102  lung]
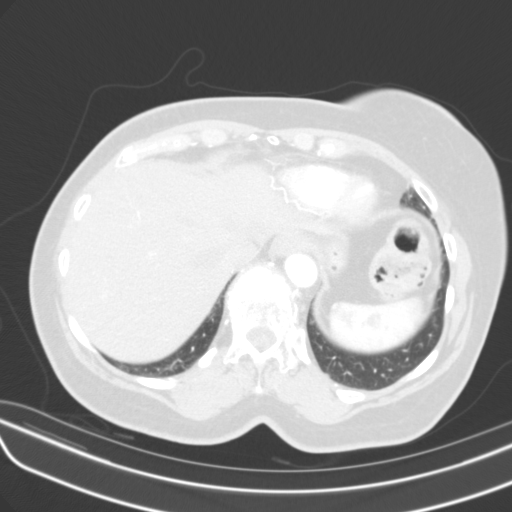
[im 30/102  soft-tissue]
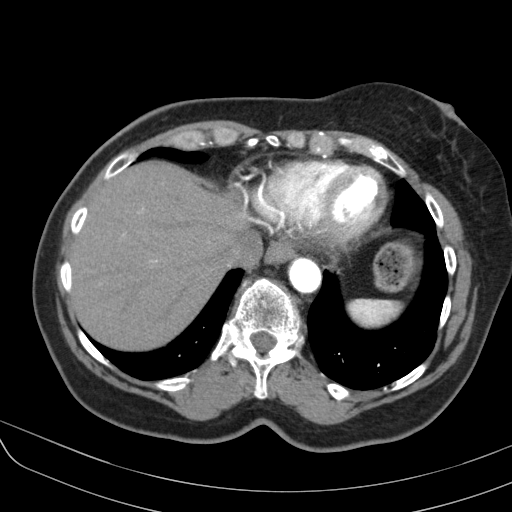
[im 36/102  lung]
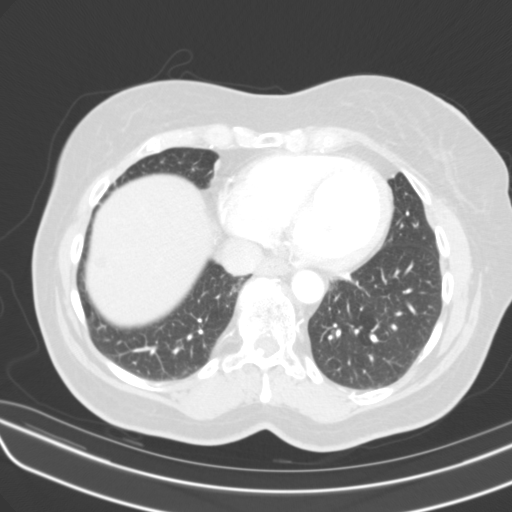
[im 40/102  soft-tissue]
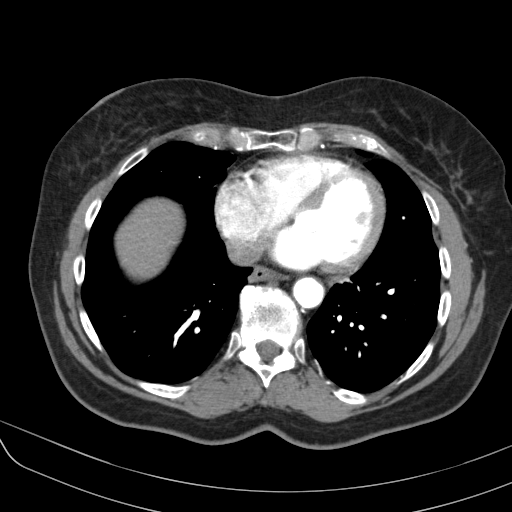
[im 46/102  lung]
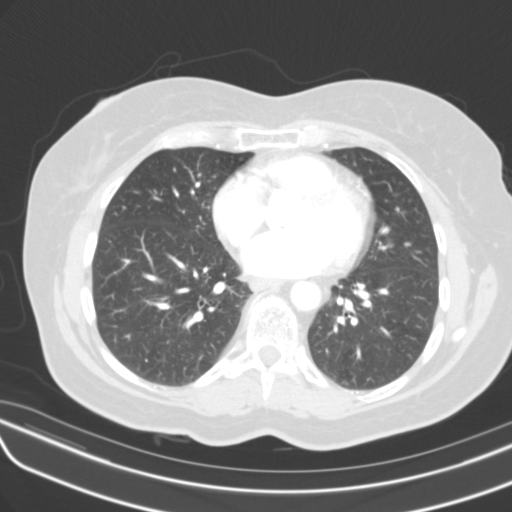
[im 53/102  soft-tissue]
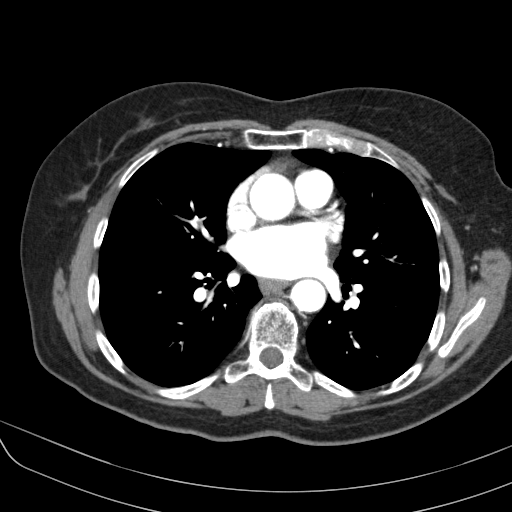
[im 56/102  lung]
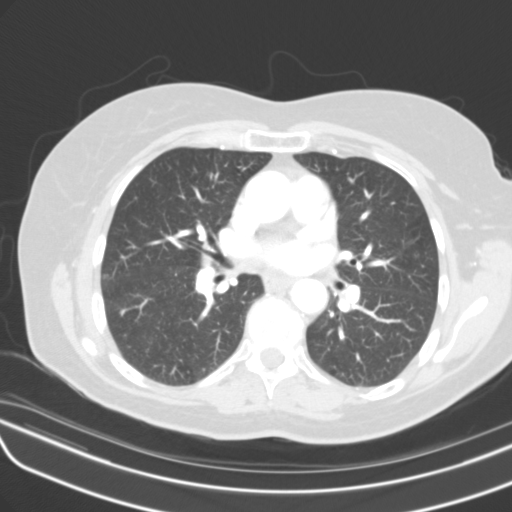
[im 62/102  soft-tissue]
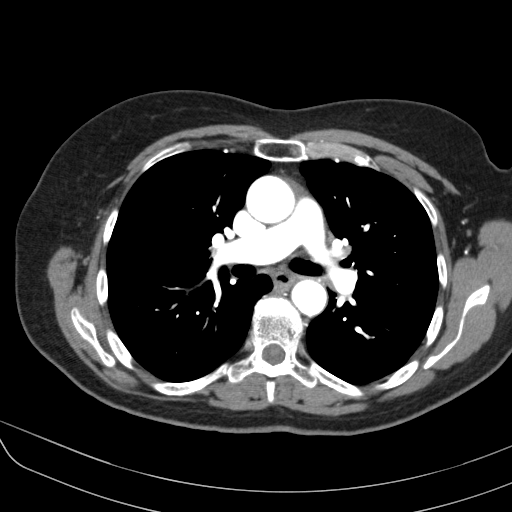
[im 66/102  lung]
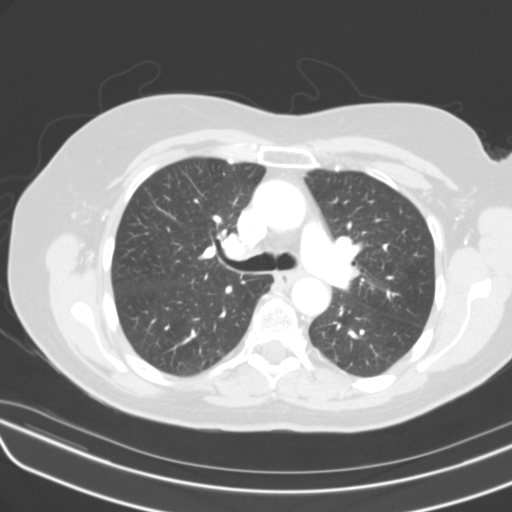
[im 72/102  soft-tissue]
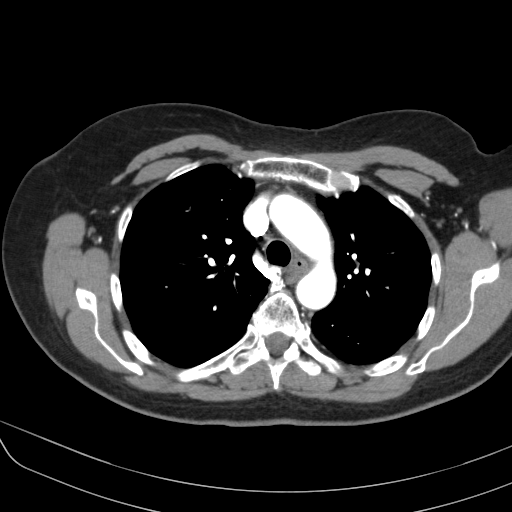
[im 75/102  lung]
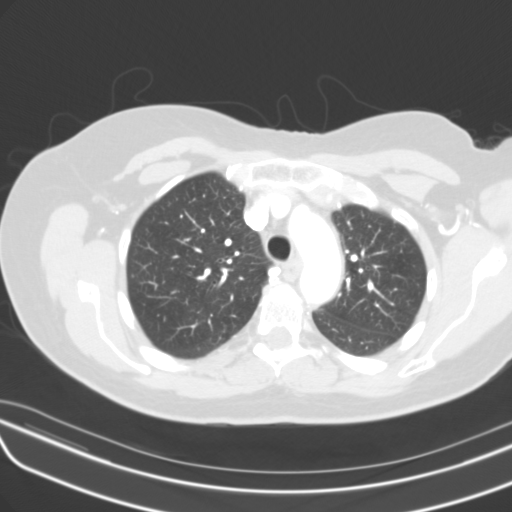
[im 82/102  soft-tissue]
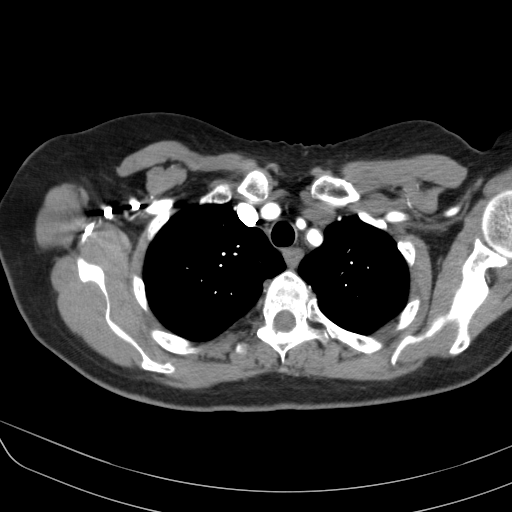
[im 88/102  lung]
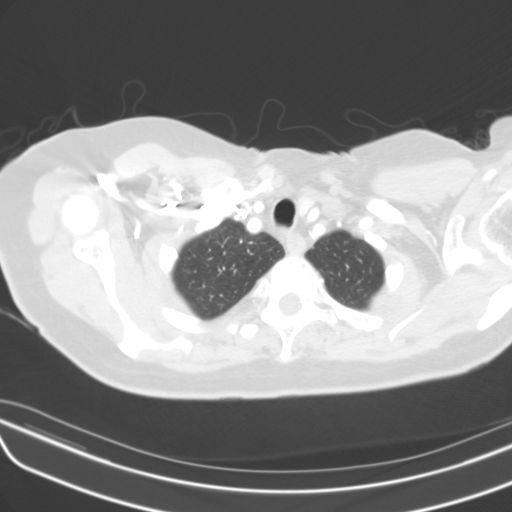
[im 92/102  soft-tissue]
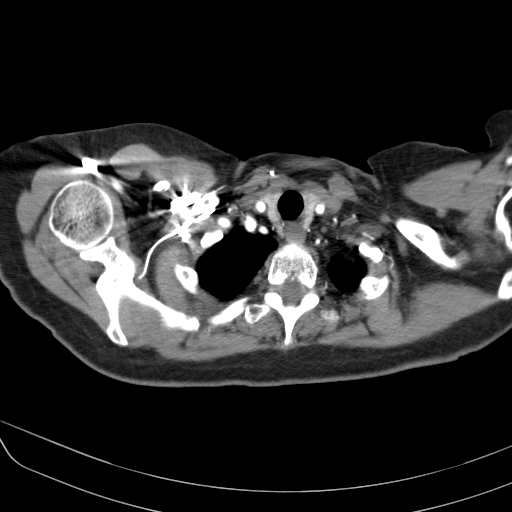
[im 98/102  lung]
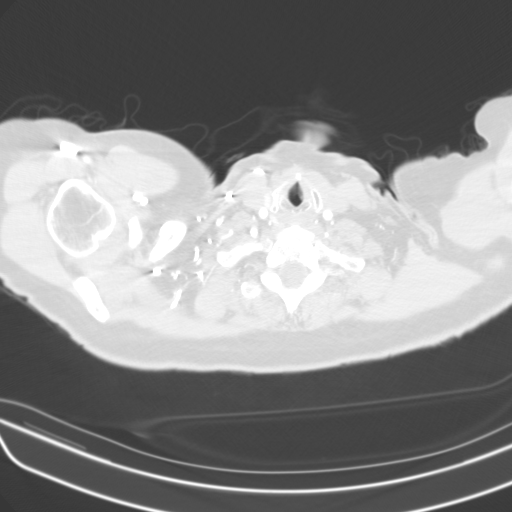

[19 of 32 positions shown; findings below may reference images not displayed]

FINDINGS: Cardiovascular: Preferential opacification of the thoracic aorta. No
evidence of thoracic aortic aneurysm or dissection. The ascending
thoracic aorta measures up to 3.0 cm. Normal heart size. No
pericardial effusion. No pulmonary embolism.

Mediastinum/Nodes: No enlarged mediastinal, hilar, or axillary lymph
nodes. Thyroid gland, trachea, and esophagus demonstrate no
significant findings.

Lungs/Pleura: Several scattered pulmonary nodules in both lungs
measuring up to 3 mm are unchanged since the prior study. No new
nodule. No focal consolidation, pleural effusion, or pneumothorax.

Upper Abdomen: No acute abnormality. Unchanged 8 mm hypodensity in
the hepatic dome, likely a small cyst or hemangioma.

Musculoskeletal: No chest wall abnormality. No acute or significant
osseous findings. 4 mm calcification in the posterior right spinal
canal at T10 is unchanged and may represent a small meningioma.

Review of the MIP images confirms the above findings.
IMPRESSION: 1. No evidence of thoracic aortic aneurysm.
2. Unchanged scattered pulmonary nodules measuring up to 3 mm. No
further follow-up required.

## 2022-05-31 ENCOUNTER — Ambulatory Visit (INDEPENDENT_AMBULATORY_CARE_PROVIDER_SITE_OTHER): Payer: Medicare Other

## 2022-05-31 ENCOUNTER — Ambulatory Visit: Payer: Medicare Other

## 2022-05-31 VITALS — Ht 65.0 in | Wt 170.0 lb

## 2022-05-31 DIAGNOSIS — Z0000 Encounter for general adult medical examination without abnormal findings: Secondary | ICD-10-CM

## 2022-05-31 NOTE — Patient Instructions (Addendum)
Anne Martin , Thank you for taking time to come for your Medicare Wellness Visit. I appreciate your ongoing commitment to your health goals. Please review the following plan we discussed and let me know if I can assist you in the future.   These are the goals we discussed:  Goals       Exercise 150 min/wk Moderate Activity      Increase physical activity (pt-stated)      Patient Stated      05/25/2021, Wants to take off weight gained since back issues        This is a list of the screening recommended for you and due dates:  Health Maintenance  Topic Date Due   COVID-19 Vaccine (7 - 2023-24 season) 06/16/2022*   Hepatitis C Screening: USPSTF Recommendation to screen - Ages 18-79 yo.  06/01/2023*   Medicare Annual Wellness Visit  06/01/2023   DTaP/Tdap/Td vaccine (2 - Td or Tdap) 11/12/2025   Pneumonia Vaccine  Completed   Flu Shot  Completed   DEXA scan (bone density measurement)  Completed   Zoster (Shingles) Vaccine  Completed   HPV Vaccine  Aged Out   Colon Cancer Screening  Discontinued  *Topic was postponed. The date shown is not the original due date.    Advanced directives: Please bring a copy of your health care power of attorney and living will to the office to be added to your chart at your convenience.   Conditions/risks identified: None  Next appointment: Follow up in one year for your annual wellness visit     Preventive Care 65 Years and Older, Female Preventive care refers to lifestyle choices and visits with your health care provider that can promote health and wellness. What does preventive care include? A yearly physical exam. This is also called an annual well check. Dental exams once or twice a year. Routine eye exams. Ask your health care provider how often you should have your eyes checked. Personal lifestyle choices, including: Daily care of your teeth and gums. Regular physical activity. Eating a healthy diet. Avoiding tobacco and drug  use. Limiting alcohol use. Practicing safe sex. Taking low-dose aspirin every day. Taking vitamin and mineral supplements as recommended by your health care provider. What happens during an annual well check? The services and screenings done by your health care provider during your annual well check will depend on your age, overall health, lifestyle risk factors, and family history of disease. Counseling  Your health care provider may ask you questions about your: Alcohol use. Tobacco use. Drug use. Emotional well-being. Home and relationship well-being. Sexual activity. Eating habits. History of falls. Memory and ability to understand (cognition). Work and work Statistician. Reproductive health. Screening  You may have the following tests or measurements: Height, weight, and BMI. Blood pressure. Lipid and cholesterol levels. These may be checked every 5 years, or more frequently if you are over 2 years old. Skin check. Lung cancer screening. You may have this screening every year starting at age 40 if you have a 30-pack-year history of smoking and currently smoke or have quit within the past 15 years. Fecal occult blood test (FOBT) of the stool. You may have this test every year starting at age 91. Flexible sigmoidoscopy or colonoscopy. You may have a sigmoidoscopy every 5 years or a colonoscopy every 10 years starting at age 65. Hepatitis C blood test. Hepatitis B blood test. Sexually transmitted disease (STD) testing. Diabetes screening. This is done by checking your blood  sugar (glucose) after you have not eaten for a while (fasting). You may have this done every 1-3 years. Bone density scan. This is done to screen for osteoporosis. You may have this done starting at age 53. Mammogram. This may be done every 1-2 years. Talk to your health care provider about how often you should have regular mammograms. Talk with your health care provider about your test results, treatment  options, and if necessary, the need for more tests. Vaccines  Your health care provider may recommend certain vaccines, such as: Influenza vaccine. This is recommended every year. Tetanus, diphtheria, and acellular pertussis (Tdap, Td) vaccine. You may need a Td booster every 10 years. Zoster vaccine. You may need this after age 68. Pneumococcal 13-valent conjugate (PCV13) vaccine. One dose is recommended after age 49. Pneumococcal polysaccharide (PPSV23) vaccine. One dose is recommended after age 9. Talk to your health care provider about which screenings and vaccines you need and how often you need them. This information is not intended to replace advice given to you by your health care provider. Make sure you discuss any questions you have with your health care provider. Document Released: 04/25/2015 Document Revised: 12/17/2015 Document Reviewed: 01/28/2015 Elsevier Interactive Patient Education  2017 [place] Prevention in the Home Falls can cause injuries. They can happen to people of all ages. There are many things you can do to make your home safe and to help prevent falls. What can I do on the outside of my home? Regularly fix the edges of walkways and driveways and fix any cracks. Remove anything that might make you trip as you walk through a door, such as a raised step or threshold. Trim any bushes or trees on the path to your home. Use bright outdoor lighting. Clear any walking paths of anything that might make someone trip, such as rocks or tools. Regularly check to see if handrails are loose or broken. Make sure that both sides of any steps have handrails. Any raised decks and porches should have guardrails on the edges. Have any leaves, snow, or ice cleared regularly. Use sand or salt on walking paths during winter. Clean up any spills in your garage right away. This includes oil or grease spills. What can I do in the bathroom? Use night lights. Install grab  bars by the toilet and in the tub and shower. Do not use towel bars as grab bars. Use non-skid mats or decals in the tub or shower. If you need to sit down in the shower, use a plastic, non-slip stool. Keep the floor dry. Clean up any water that spills on the floor as soon as it happens. Remove soap buildup in the tub or shower regularly. Attach bath mats securely with double-sided non-slip rug tape. Do not have throw rugs and other things on the floor that can make you trip. What can I do in the bedroom? Use night lights. Make sure that you have a light by your bed that is easy to reach. Do not use any sheets or blankets that are too big for your bed. They should not hang down onto the floor. Have a firm chair that has side arms. You can use this for support while you get dressed. Do not have throw rugs and other things on the floor that can make you trip. What can I do in the kitchen? Clean up any spills right away. Avoid walking on wet floors. Keep items that you use a lot in easy-to-reach  places. If you need to reach something above you, use a strong step stool that has a grab bar. Keep electrical cords out of the way. Do not use floor polish or wax that makes floors slippery. If you must use wax, use non-skid floor wax. Do not have throw rugs and other things on the floor that can make you trip. What can I do with my stairs? Do not leave any items on the stairs. Make sure that there are handrails on both sides of the stairs and use them. Fix handrails that are broken or loose. Make sure that handrails are as long as the stairways. Check any carpeting to make sure that it is firmly attached to the stairs. Fix any carpet that is loose or worn. Avoid having throw rugs at the top or bottom of the stairs. If you do have throw rugs, attach them to the floor with carpet tape. Make sure that you have a light switch at the top of the stairs and the bottom of the stairs. If you do not have them,  ask someone to add them for you. What else can I do to help prevent falls? Wear shoes that: Do not have high heels. Have rubber bottoms. Are comfortable and fit you well. Are closed at the toe. Do not wear sandals. If you use a stepladder: Make sure that it is fully opened. Do not climb a closed stepladder. Make sure that both sides of the stepladder are locked into place. Ask someone to hold it for you, if possible. Clearly mark and make sure that you can see: Any grab bars or handrails. First and last steps. Where the edge of each step is. Use tools that help you move around (mobility aids) if they are needed. These include: Canes. Walkers. Scooters. Crutches. Turn on the lights when you go into a dark area. Replace any light bulbs as soon as they burn out. Set up your furniture so you have a clear path. Avoid moving your furniture around. If any of your floors are uneven, fix them. If there are any pets around you, be aware of where they are. Review your medicines with your doctor. Some medicines can make you feel dizzy. This can increase your chance of falling. Ask your doctor what other things that you can do to help prevent falls. This information is not intended to replace advice given to you by your health care provider. Make sure you discuss any questions you have with your health care provider. Document Released: 01/23/2009 Document Revised: 09/04/2015 Document Reviewed: 05/03/2014 Elsevier Interactive Patient Education  2017 [employer].

## 2022-05-31 NOTE — Progress Notes (Signed)
Subjective:   Anne Martin is a 77 y.o. female who presents for Medicare Annual (Subsequent) preventive examination.  Review of Systems    Virtual Visit via Telephone Note  I connected with  CRISTELLE Martin on 05/31/22 at  8:15 AM EST by telephone and verified that I am speaking with the correct person using two identifiers.  Location: Patient: Home Provider: Office Persons participating in the virtual visit: patient/Nurse Health Advisor   I discussed the limitations, risks, security and privacy concerns of performing an evaluation and management service by telephone and the availability of in person appointments. The patient expressed understanding and agreed to proceed.  Interactive audio and video telecommunications were attempted between this nurse and patient, however failed, due to patient having technical difficulties OR patient did not have access to video capability.  We continued and completed visit with audio only.  Some vital signs may be absent or patient reported.   Anne Peaches, LPN  Cardiac Risk Factors include: advanced age (>5mn, >>47women);hypertension     Objective:    Today's Vitals   05/31/22 0824  Weight: 170 lb (77.1 kg)  Height: 5' 5"$  (1.651 m)   Body mass index is 28.29 kg/m.     05/31/2022    8:30 AM 09/23/2021    1:24 PM 08/28/2021    9:46 PM 05/25/2021    8:17 AM 03/12/2021   10:07 AM 09/05/2020   10:22 AM 05/22/2020    2:57 PM  Advanced Directives  Does Patient Have a Medical Advance Directive? Yes No No Yes Yes Yes Yes  Type of AParamed[place]of [place]Living will   [place]   [place]  Does patient want to make changes to medical advance directive?       No - Patient declined  Copy of [place] Chart? No - copy requested   No - copy requested   No - copy requested    Current Medications (verified) Outpatient Encounter Medications  as of 05/31/2022  Medication Sig   alendronate (FOSAMAX) 70 MG tablet Take 70 mg by mouth once a week.   amoxicillin-clavulanate (AUGMENTIN) 875-125 MG tablet Take 1 tablet by mouth 2 (two) times daily.   apixaban (ELIQUIS) 5 MG TABS tablet Take 1 tablet (5 mg total) by mouth 2 (two) times daily.   atorvastatin (LIPITOR) 80 MG tablet TAKE 1 TABLET BY MOUTH EVERY DAY AT 6PM   calcium carbonate (OSCAL) 1500 (600 Ca) MG TABS tablet Take 600 mg of elemental calcium by mouth daily.   cholecalciferol (VITAMIN D) 1000 UNITS tablet Take 1,000 Units by mouth every evening.    isosorbide mononitrate (IMDUR) 30 MG 24 hr tablet TAKE 1 TABLET BY MOUTH EVERY DAY   loratadine (CLARITIN) 10 MG tablet Take 10 mg by mouth daily.    metoprolol succinate (TOPROL-XL) 50 MG 24 hr tablet TAKE 1 TABLET BY MOUTH DAILY. TAKE WITH OR IMMEDIATELY FOLLOWING A MEAL.   nitroGLYCERIN (NITROSTAT) 0.4 MG SL tablet Place 1 tablet (0.4 mg total) under the tongue every 5 (five) minutes as needed. MAX 3 doses   Probiotic Product (PROBIOTIC PO) Take 1 tablet by mouth daily.    vitamin C (ASCORBIC ACID) 500 MG tablet Take 500 mg by mouth at bedtime.   YUVAFEM 10 MCG TABS vaginal tablet Place 10 mcg vaginally 2 (two) times a week.    Zinc 50 MG TABS Take 50 mg by mouth.  No facility-administered encounter medications on file as of 05/31/2022.    Allergies (verified) Procaine and Procaine hcl   History: Past Medical History:  Diagnosis Date   Allergy    seasonal   Anemia    prior to hysterectomy, fibroids   Aortic insufficiency    a. 10/2015 Echo: mild to moderate AI.   Aortic stenosis    a. 10/2015 Echo: moderate AS.   Blood transfusion without reported diagnosis    with hysterectomy   CAD (coronary artery disease)    a. 08/2009 s/p cath after abnormal nuclear study; LM nl, LAD 60%, D2 90% (PCI/DES), LCX 50 -60%, RCA nl, nl EF;  b. 2012 MV: no ischemia/infarct;  c. 08/2015 MV: no ischemia/infarct, EF 70%.   Complication of  anesthesia    Diastolic dysfunction    a. 10/2015 Echo: EF 55-60%, no rwma, Gr1 DD, mod AS, mild to mod AI.   Essential hypertension    preventive meds due to heart   H/O: rheumatic fever    Heart murmur    History of colonoscopy    Hyperlipidemia    preventative med for heart   Osteopenia    last DEXA 11-22-13    PONV (postoperative nausea and vomiting)    Past Surgical History:  Procedure Laterality Date   ANGIOPLASTY  08/2009   stents   AORTIC ARCH ANGIOGRAPHY N/A 03/29/2018   Procedure: AORTIC ARCH ANGIOGRAPHY;  Surgeon: Nelva Bush, MD;  Location: [facility];  Service: Cardiovascular;  Laterality: N/A;   BACK SURGERY     11/2020   CHOLECYSTECTOMY  02/2003   COLONOSCOPY  08/06/2021   per Dr. Henrene Pastor, clear, no repeats needed   INTRAVASCULAR PRESSURE WIRE/FFR STUDY N/A 03/29/2018   Procedure: INTRAVASCULAR PRESSURE WIRE/FFR STUDY;  Surgeon: Nelva Bush, MD;  Location: [facility];  Service: Cardiovascular;  Laterality: N/A;   KNEE ARTHROSCOPY     Rt. Knee   LEFT HEART CATH AND CORONARY ANGIOGRAPHY N/A 03/29/2018   Procedure: LEFT HEART CATH AND CORONARY ANGIOGRAPHY;  Surgeon: Nelva Bush, MD;  Location: [facility];  Service: Cardiovascular;  Laterality: N/A;   LUMBAR LAMINECTOMY/DECOMPRESSION MICRODISCECTOMY Left 01/03/2020   Procedure: Left L2-3 decompression disectomy, Insitu Fusion Lumbar 2-3;  Surgeon: Melina Schools, MD;  Location: [facility];  Service: Orthopedics;  Laterality: Left;   POLYPECTOMY     TONSILLECTOMY     TONSILLECTOMY     VAGINAL HYSTERECTOMY  1994   Family History  Problem Relation Age of Onset   Heart disease Mother    Heart disease Father        CABG   Heart disease Sister    Heart disease Brother        2 stents   Heart disease Maternal Grandmother    Heart disease Maternal Grandfather    Heart disease Paternal Grandmother    Heart disease Paternal Grandfather    Ulcerative colitis Daughter    Colon cancer Neg  Hx    Colon polyps Neg Hx    Esophageal cancer Neg Hx    Rectal cancer Neg Hx    Stomach cancer Neg Hx    Social History   Socioeconomic History   Marital status: Married    Spouse name: Not on file   Number of children: 2   Years of education: Not on file   Highest education level: Not on file  Occupational History   Occupation: Retired Product manager: RETIRED  Tobacco Use  Smoking status: Former    Types: Cigarettes    Quit date: 04/12/1977    Years since quitting: 45.1   Smokeless tobacco: Never   Tobacco comments:    quit age 37  Vaping Use   Vaping Use: Never used  Substance and Sexual Activity   Alcohol use: Not Currently    Alcohol/week: 0.0 standard drinks of alcohol    Comment: rarely   Drug use: No   Sexual activity: Not Currently  Other Topics Concern   Not on file  Social History Narrative   Not on file   Social Determinants of Health   Financial Resource Strain: Low Risk  (05/31/2022)   Overall Financial Resource Strain (CARDIA)    Difficulty of Paying Living Expenses: Not hard at all  Food Insecurity: No Food Insecurity (05/31/2022)   Hunger Vital Sign    Worried About Running Out of Food in the Last Year: Never true    Ran Out of Food in the Last Year: Never true  Transportation Needs: No Transportation Needs (05/31/2022)   PRAPARE - Hydrologist (Medical): No    Lack of Transportation (Non-Medical): No  Physical Activity: Sufficiently Active (05/31/2022)   Exercise Vital Sign    Days of Exercise per Week: 5 days    Minutes of Exercise per Session: 30 min  Stress: No Stress Concern Present (05/31/2022)   [employer]    Feeling of Stress : Not at all  Social Connections: Moderately Isolated (05/31/2022)   Social Connection and Isolation Panel [NHANES]    Frequency of Communication with Friends and Family: More than three times a week    Frequency of  Social Gatherings with Friends and Family: More than three times a week    Attends Religious Services: Never    Marine scientist or Organizations: No    Attends Music therapist: Never    Marital Status: Married    Tobacco Counseling Counseling given: Not Answered Tobacco comments: quit age 89   Clinical Intake:  Pre-visit preparation completed: Yes  Pain : No/denies pain     BMI - recorded: 28.29 Nutritional Status: BMI 25 -29 Overweight Nutritional Risks: None Diabetes: No  How often do you need to have someone help you when you read instructions, pamphlets, or other written materials from your doctor or pharmacy?: 1 - Never  Diabetic?  No  Interpreter Needed?: No  Information entered by :: Rolene Arbour LPN   Activities of Daily Living    05/31/2022    8:29 AM  In your present state of health, do you have any difficulty performing the following activities:  Hearing? 0  Vision? 0  Difficulty concentrating or making decisions? 0  Walking or climbing stairs? 0  Dressing or bathing? 0  Doing errands, shopping? 0  Preparing Food and eating ? N  Using the Toilet? N  In the past six months, have you accidently leaked urine? N  Do you have problems with loss of bowel control? N  Managing your Medications? N  Managing your Finances? N  Housekeeping or managing your Housekeeping? N    Patient Care Team: Laurey Morale, MD as PCP - General Stanford Breed, Denice Bors, MD as PCP - Cardiology (Cardiology) Melina Schools, MD as Consulting Physician (Orthopedic Surgery)  Indicate any recent Medical Services you may have received from other than [facility] providers in the past year (date may be approximate).  Assessment:   This is a routine wellness examination for [place].  Hearing/Vision screen Hearing Screening - Comments:: Denies hearing difficulties   Vision Screening - Comments:: Wears reading glasses - up to date with routine eye exams with Dr Herbert Deaner    Dietary issues and exercise activities discussed: Exercise limited by: None identified   Goals Addressed               This Visit's Progress     Increase physical activity (pt-stated)         Depression Screen    05/31/2022    8:28 AM 02/05/2022    9:18 AM 05/25/2021    8:17 AM 03/23/2021    2:41 PM 05/22/2020    2:58 PM 02/14/2020    9:04 AM 01/05/2018    8:34 AM  PHQ 2/9 Scores  PHQ - 2 Score 0 0 0 0 0 0 0  PHQ- 9 Score  0  0  0     Fall Risk    05/31/2022    8:29 AM 02/05/2022    9:18 AM 05/25/2021    8:17 AM 03/23/2021    2:41 PM 01/30/2021    9:46 AM  Fall Risk   Falls in the past year? 0 0 0 0 0  Number falls in past yr: 0 0  0 0  Injury with Fall? 0 0  0 0  Risk for fall due to : No Fall Risks No Fall Risks Medication side effect No Fall Risks No Fall Risks  Follow up Falls prevention discussed Falls evaluation completed Falls evaluation completed;Education provided;Falls prevention discussed      FALL RISK PREVENTION PERTAINING TO THE HOME:  Any stairs in or around the home? Yes  If so, are there any without handrails? No  Home free of loose throw rugs in walkways, pet beds, electrical cords, etc? Yes  Adequate lighting in your home to reduce risk of falls? Yes   ASSISTIVE DEVICES UTILIZED TO PREVENT FALLS:  Life alert? No  Use of a cane, walker or w/c? No  Grab bars in the bathroom? Yes  Shower chair or bench in shower? Yes  Elevated toilet seat or a handicapped toilet? Yes   TIMED UP AND GO:  Was the test performed? No . Audio Visit   Cognitive Function:        05/31/2022    8:30 AM 05/25/2021    8:18 AM  6CIT Screen  What Year? 0 points 0 points  What month? 0 points 0 points  What time? 0 points 0 points  Count back from 20 0 points 0 points  Months in reverse 0 points 0 points  Repeat phrase 0 points 0 points  Total Score 0 points 0 points    Immunizations Immunization History  Administered Date(s) Administered   Influenza  Split 01/20/2011, 01/13/2012   Influenza Whole 01/19/2008, 01/21/2010   Influenza, High Dose Seasonal PF 01/05/2018, 02/04/2020   Influenza, Seasonal, Injecte, Preservative Fre 12/22/2018   Influenza-Unspecified 01/01/2014, 01/20/2015, 01/19/2016, 02/09/2022   PFIZER(Purple Top)SARS-COV-2 Vaccination 04/21/2019, 05/12/2019, 01/21/2020   Pfizer Fall 2023 Covid-19 Vaccine 7yr thru 151yr 02/01/2022   Pneumococcal Conjugate-13 11/13/2015   Pneumococcal Polysaccharide-23 01/05/2018   Tdap 11/13/2015   Zoster Recombinat (Shingrix) 01/11/2019, 03/16/2019   Zoster, Live 08/16/2013    TDAP status: Up to date  Flu Vaccine status: Up to date  Pneumococcal vaccine status: Up to date  Covid-19 vaccine status: Completed vaccines  Qualifies for Shingles Vaccine? Yes   Zostavax  completed Yes   Shingrix Completed?: Yes  Screening Tests Health Maintenance  Topic Date Due   COVID-19 Vaccine (7 - 2023-24 season) 06/16/2022 (Originally 03/29/2022)   Hepatitis C Screening  06/01/2023 (Originally 02/25/1964)   Medicare Annual Wellness (AWV)  06/01/2023   DTaP/Tdap/Td (2 - Td or Tdap) 11/12/2025   Pneumonia Vaccine 71+ Years old  Completed   INFLUENZA VACCINE  Completed   DEXA SCAN  Completed   Zoster Vaccines- Shingrix  Completed   HPV VACCINES  Aged Out   COLONOSCOPY (Pts 45-26yr Insurance coverage will need to be confirmed)  Discontinued    Health Maintenance  There are no preventive care reminders to display for this patient.   Colorectal cancer screening: No longer required.   Mammogram status: No longer required due to ge.  Bone Density status: Completed 03/12/22. Results reflect: Bone density results: OSTEOPOROSIS. Repeat every   years.  Lung Cancer Screening: (Low Dose CT Chest recommended if Age 78-80years, 30 pack-year currently smoking OR have quit w/in 15years.) does not qualify.     Additional Screening:  Hepatitis C Screening: does qualify;  Deferred  Vision  Screening: Recommended annual ophthalmology exams for early detection of glaucoma and other disorders of the eye. Is the patient up to date with their annual eye exam?  Yes  Who is the provider or what is the name of the office in which the patient attends annual eye exams? Dr HHerbert DeanerIf pt is not established with a provider, would they like to be referred to a provider to establish care? No .   Dental Screening: Recommended annual dental exams for proper oral hygiene  Community Resource Referral / Chronic Care Management:  CRR required this visit?  No   CCM required this visit?  No      Plan:     I have personally reviewed and noted the following in the patient's chart:   Medical and social history Use of alcohol, tobacco or illicit drugs  Current medications and supplements including opioid prescriptions. Patient is not currently taking opioid prescriptions. Functional ability and status Nutritional status Physical activity Advanced directives List of other physicians Hospitalizations, surgeries, and ER visits in previous 12 months Vitals Screenings to include cognitive, depression, and falls Referrals and appointments  In addition, I have reviewed and discussed with patient certain preventive protocols, quality metrics, and best practice recommendations. A written personalized care plan for preventive services as well as general preventive health recommendations were provided to patient.     BCriselda Peaches LPN   [id number]  Nurse Notes: Patient due Hep-C Screening

## 2022-07-05 ENCOUNTER — Other Ambulatory Visit: Payer: Self-pay | Admitting: Cardiology

## 2022-07-05 DIAGNOSIS — I480 Paroxysmal atrial fibrillation: Secondary | ICD-10-CM

## 2022-07-05 NOTE — Telephone Encounter (Signed)
Prescription refill request for Eliquis received. Indication: Afib  Last office visit: 12/21/21 ([facility])  Scr: 0.82 (03/24/22)  Age: 77  Weight: 77.1kg  Appropriate dose. Refill sent.

## 2022-07-07 ENCOUNTER — Ambulatory Visit: Payer: Medicare Other | Admitting: Cardiology

## 2022-07-13 NOTE — Progress Notes (Deleted)
HPI: FU CAD and atrial fibrillation. Cardiac catheterization was performed in May of 2011. This revealed Normal LM, 60 LAD, 90 D2, 50-60 LCx, normal RCA, and nl LV function. Patient had PCI of the diagonal with DES at that time. Carotid Dopplers in Oct 2013 were normal. Cardiac catheterization December 2019 showed nonobstructive coronary disease with moderate mid LAD and moderate in-stent restenosis in second diagonal and no disease in the circumflex or right coronary artery. LV function was normal with normal LV filling pressures. The ascending aorta was mildly dilated. CTA August 2021 showed no evidence of thoracic aortic aneurysm.  Scattered pulmonary nodules unchanged and no follow-up recommended.  Monitor April 2022 showed sinus rhythm with occasional PAC, brief PAT and PVC. Found to have atrial fibrillation June 2023.  PET scan August 2023 showed ejection fraction 71% and normal perfusion.  Echo 11/23 showed normal LV function, mild LVH, grade 1 DD, mild to moderate AS (mean gradient 13 mmHg; AVA 1.1 cm2) and mild AI. Since I last saw her,   Current Outpatient Medications  Medication Sig Dispense Refill   ELIQUIS 5 MG TABS tablet TAKE 1 TABLET BY MOUTH TWICE A DAY 180 tablet 1   alendronate (FOSAMAX) 70 MG tablet Take 70 mg by mouth once a week.     amoxicillin-clavulanate (AUGMENTIN) 875-125 MG tablet Take 1 tablet by mouth 2 (two) times daily. 20 tablet 0   atorvastatin (LIPITOR) 80 MG tablet TAKE 1 TABLET BY MOUTH EVERY DAY AT 6PM 90 tablet 3   calcium carbonate (OSCAL) 1500 (600 Ca) MG TABS tablet Take 600 mg of elemental calcium by mouth daily.     cholecalciferol (VITAMIN D) 1000 UNITS tablet Take 1,000 Units by mouth every evening.      isosorbide mononitrate (IMDUR) 30 MG 24 hr tablet TAKE 1 TABLET BY MOUTH EVERY DAY 90 tablet 1   loratadine (CLARITIN) 10 MG tablet Take 10 mg by mouth daily.      metoprolol succinate (TOPROL-XL) 50 MG 24 hr tablet TAKE 1 TABLET BY MOUTH DAILY. TAKE  WITH OR IMMEDIATELY FOLLOWING A MEAL. 90 tablet 3   nitroGLYCERIN (NITROSTAT) 0.4 MG SL tablet Place 1 tablet (0.4 mg total) under the tongue every 5 (five) minutes as needed. MAX 3 doses 25 tablet 3   Probiotic Product (PROBIOTIC PO) Take 1 tablet by mouth daily.      vitamin C (ASCORBIC ACID) 500 MG tablet Take 500 mg by mouth at bedtime.     YUVAFEM 10 MCG TABS vaginal tablet Place 10 mcg vaginally 2 (two) times a week.   1   Zinc 50 MG TABS Take 50 mg by mouth.     No current facility-administered medications for this visit.     Past Medical History:  Diagnosis Date   Allergy    seasonal   Anemia    prior to hysterectomy, fibroids   Aortic insufficiency    a. 10/2015 Echo: mild to moderate AI.   Aortic stenosis    a. 10/2015 Echo: moderate AS.   Blood transfusion without reported diagnosis    with hysterectomy   CAD (coronary artery disease)    a. 08/2009 s/p cath after abnormal nuclear study; LM nl, LAD 60%, D2 90% (PCI/DES), LCX 50 -60%, RCA nl, nl EF;  b. 2012 MV: no ischemia/infarct;  c. 08/2015 MV: no ischemia/infarct, EF 70%.   Complication of anesthesia    Diastolic dysfunction    a. 10/2015 Echo: EF 55-60%, no rwma, Gr1 DD,  mod AS, mild to mod AI.   Essential hypertension    preventive meds due to heart   H/O: rheumatic fever    Heart murmur    History of colonoscopy    Hyperlipidemia    preventative med for heart   Osteopenia    last DEXA 11-22-13    PONV (postoperative nausea and vomiting)     Past Surgical History:  Procedure Laterality Date   ANGIOPLASTY  08/2009   stents   AORTIC ARCH ANGIOGRAPHY N/A 03/29/2018   Procedure: AORTIC ARCH ANGIOGRAPHY;  Surgeon: Nelva Bush, MD;  Location: [facility];  Service: Cardiovascular;  Laterality: N/A;   BACK SURGERY     11/2020   CHOLECYSTECTOMY  02/2003   COLONOSCOPY  08/06/2021   per Dr. Henrene Pastor, clear, no repeats needed   CORONARY PRESSURE/FFR STUDY N/A 03/29/2018   Procedure: INTRAVASCULAR PRESSURE  WIRE/FFR STUDY;  Surgeon: Nelva Bush, MD;  Location: [facility];  Service: Cardiovascular;  Laterality: N/A;   KNEE ARTHROSCOPY     Rt. Knee   LEFT HEART CATH AND CORONARY ANGIOGRAPHY N/A 03/29/2018   Procedure: LEFT HEART CATH AND CORONARY ANGIOGRAPHY;  Surgeon: Nelva Bush, MD;  Location: [facility];  Service: Cardiovascular;  Laterality: N/A;   LUMBAR LAMINECTOMY/DECOMPRESSION MICRODISCECTOMY Left 01/03/2020   Procedure: Left L2-3 decompression disectomy, Insitu Fusion Lumbar 2-3;  Surgeon: Melina Schools, MD;  Location: [facility];  Service: Orthopedics;  Laterality: Left;   POLYPECTOMY     TONSILLECTOMY     TONSILLECTOMY     VAGINAL HYSTERECTOMY  1994    Social History   Socioeconomic History   Marital status: Married    Spouse name: Not on file   Number of children: 2   Years of education: Not on file   Highest education level: Not on file  Occupational History   Occupation: Retired Product manager: RETIRED  Tobacco Use   Smoking status: Former    Types: Cigarettes    Quit date: 04/12/1977    Years since quitting: 45.2   Smokeless tobacco: Never   Tobacco comments:    quit age 38  Vaping Use   Vaping Use: Never used  Substance and Sexual Activity   Alcohol use: Not Currently    Alcohol/week: 0.0 standard drinks of alcohol    Comment: rarely   Drug use: No   Sexual activity: Not Currently  Other Topics Concern   Not on file  Social History Narrative   Not on file   Social Determinants of Health   Financial Resource Strain: Low Risk  (05/31/2022)   Overall Financial Resource Strain (CARDIA)    Difficulty of Paying Living Expenses: Not hard at all  Food Insecurity: No Food Insecurity (05/31/2022)   Hunger Vital Sign    Worried About Running Out of Food in the Last Year: Never true    [place] in the Last Year: Never true  Transportation Needs: No Transportation Needs (05/31/2022)   PRAPARE - Radiographer, therapeutic (Medical): No    Lack of Transportation (Non-Medical): No  Physical Activity: Sufficiently Active (05/31/2022)   Exercise Vital Sign    Days of Exercise per Week: 5 days    Minutes of Exercise per Session: 30 min  Stress: No Stress Concern Present (05/31/2022)   [place]    Feeling of Stress : Not at all  Social Connections: Moderately Isolated (05/31/2022)  Social Licensed conveyancer [NHANES]    Frequency of Communication with Friends and Family: More than three times a week    Frequency of Social Gatherings with Friends and Family: More than three times a week    Attends Religious Services: Never    Marine scientist or Organizations: No    Attends Archivist Meetings: Never    Marital Status: Married  Human resources officer Violence: Not At Risk (05/31/2022)   Humiliation, Afraid, Rape, and Kick questionnaire    Fear of Current or Ex-Partner: No    Emotionally Abused: No    Physically Abused: No    Sexually Abused: No    Family History  Problem Relation Age of Onset   Heart disease Mother    Heart disease Father        CABG   Heart disease Sister    Heart disease Brother        2 stents   Heart disease Maternal Grandmother    Heart disease Maternal Grandfather    Heart disease Paternal Grandmother    Heart disease Paternal Grandfather    Ulcerative colitis Daughter    Colon cancer Neg Hx    Colon polyps Neg Hx    Esophageal cancer Neg Hx    Rectal cancer Neg Hx    Stomach cancer Neg Hx     ROS: no fevers or chills, productive cough, hemoptysis, dysphasia, odynophagia, melena, hematochezia, dysuria, hematuria, rash, seizure activity, orthopnea, PND, pedal edema, claudication. Remaining systems are negative.  Physical Exam: Well-developed well-nourished in no acute distress.  Skin is warm and dry.  HEENT is normal.  Neck is supple.  Chest is clear to auscultation  with normal expansion.  Cardiovascular exam is regular rate and rhythm.  Abdominal exam nontender or distended. No masses palpated. Extremities show no edema. neuro grossly intact  ECG- personally reviewed  A/P  1 paroxysmal atrial fibrillation-patient is in sinus rhythm today.  Continue metoprolol for rate control if atrial fibrillation recurs.  Continue apixaban.  2 aortic stenosis/aortic insufficiency-patient is asymptomatic.  Plan follow-up echocardiogram November 2024.  She understands that she will most likely require aortic valve replacement in the future.  3 coronary artery disease-patient has not had recurrent chest pain.  Recent PET scan showed no ischemia.  Continue medical therapy including statin therapy.  She is not on aspirin given need for apixaban.  4 hypertension-patient's blood pressure is controlled today.  Continue present medications and follow-up.  5 hyperlipidemia-continue statin.  Kirk Ruths, MD

## 2022-07-21 ENCOUNTER — Ambulatory Visit: Payer: Medicare Other | Admitting: Cardiology

## 2022-07-22 NOTE — Progress Notes (Signed)
HPI: FU CAD and atrial fibrillation. Cardiac catheterization was performed in May of 2011. This revealed Normal LM, 60 LAD, 90 D2, 50-60 LCx, normal RCA, and nl LV function. Patient had PCI of the diagonal with DES at that time. Carotid Dopplers in Oct 2013 were normal. Cardiac catheterization December 2019 showed nonobstructive coronary disease with moderate mid LAD and moderate in-stent restenosis in second diagonal and no disease in the circumflex or right coronary artery. LV function was normal with normal LV filling pressures. The ascending aorta was mildly dilated. CTA August 2021 showed no evidence of thoracic aortic aneurysm.  Scattered pulmonary nodules unchanged and no follow-up recommended.  Monitor April 2022 showed sinus rhythm with occasional PAC, brief PAT and PVC. Found to have atrial fibrillation June 2023.  Metoprolol was increased and apixaban added to her medications.  Monitor July 2023 showed sinus rhythm with PACs, brief runs of PAT, occasional PVC and couplet.  PET scan August 2023 showed ejection fraction 71% and normal perfusion.  Echocardiogram November 2023 showed normal LV function, mild left ventricular hypertrophy, grade 1 diastolic dysfunction, mild aortic insufficiency, mild to moderate aortic stenosis with mean gradient 13 mmHg and aortic valve area 1.1 cm.  Since I last saw her, the patient has dyspnea with more extreme activities but not with routine activities. It is relieved with rest. It is not associated with chest pain. There is no orthopnea, PND or pedal edema. There is no syncope or palpitations. There is no exertional chest pain.   Current Outpatient Medications  Medication Sig Dispense Refill   alendronate (FOSAMAX) 70 MG tablet Take 70 mg by mouth once a week.     atorvastatin (LIPITOR) 80 MG tablet TAKE 1 TABLET BY MOUTH EVERY DAY AT 6PM 90 tablet 3   calcium carbonate (OSCAL) 1500 (600 Ca) MG TABS tablet Take 600 mg of elemental calcium by mouth daily.      cholecalciferol (VITAMIN D) 1000 UNITS tablet Take 1,000 Units by mouth every evening.      ELIQUIS 5 MG TABS tablet TAKE 1 TABLET BY MOUTH TWICE A DAY 180 tablet 1   isosorbide mononitrate (IMDUR) 30 MG 24 hr tablet TAKE 1 TABLET BY MOUTH EVERY DAY 90 tablet 1   loratadine (CLARITIN) 10 MG tablet Take 10 mg by mouth daily.      metoprolol succinate (TOPROL-XL) 50 MG 24 hr tablet TAKE 1 TABLET BY MOUTH DAILY. TAKE WITH OR IMMEDIATELY FOLLOWING A MEAL. (Patient taking differently: Take 50 mg by mouth daily. TAKE WITH OR IMMEDIATELY FOLLOWING A MEAL. Per patient taking 75 mg daily) 90 tablet 3   nitroGLYCERIN (NITROSTAT) 0.4 MG SL tablet Place 1 tablet (0.4 mg total) under the tongue every 5 (five) minutes as needed. MAX 3 doses 25 tablet 3   Probiotic Product (PROBIOTIC PO) Take 1 tablet by mouth daily.      vitamin C (ASCORBIC ACID) 500 MG tablet Take 500 mg by mouth at bedtime.     YUVAFEM 10 MCG TABS vaginal tablet Place 10 mcg vaginally 2 (two) times a week.   1   Zinc 50 MG TABS Take 50 mg by mouth.     No current facility-administered medications for this visit.     Past Medical History:  Diagnosis Date   Allergy    seasonal   Anemia    prior to hysterectomy, fibroids   Aortic insufficiency    a. 10/2015 Echo: mild to moderate AI.   Aortic stenosis  a. 10/2015 Echo: moderate AS.   Blood transfusion without reported diagnosis    with hysterectomy   CAD (coronary artery disease)    a. 08/2009 s/p cath after abnormal nuclear study; LM nl, LAD 60%, D2 90% (PCI/DES), LCX 50 -60%, RCA nl, nl EF;  b. 2012 MV: no ischemia/infarct;  c. 08/2015 MV: no ischemia/infarct, EF 70%.   Complication of anesthesia    Diastolic dysfunction    a. 10/2015 Echo: EF 55-60%, no rwma, Gr1 DD, mod AS, mild to mod AI.   Essential hypertension    preventive meds due to heart   H/O: rheumatic fever    Heart murmur    History of colonoscopy    Hyperlipidemia    preventative med for heart   Osteopenia     last DEXA 11-22-13    PONV (postoperative nausea and vomiting)     Past Surgical History:  Procedure Laterality Date   ANGIOPLASTY  08/2009   stents   AORTIC ARCH ANGIOGRAPHY N/A 03/29/2018   Procedure: AORTIC ARCH ANGIOGRAPHY;  Surgeon: Yvonne Kendall, MD;  Location: [facility];  Service: Cardiovascular;  Laterality: N/A;   BACK SURGERY     11/2020   CHOLECYSTECTOMY  02/2003   COLONOSCOPY  08/06/2021   per Dr. Marina Goodell, clear, no repeats needed   CORONARY PRESSURE/FFR STUDY N/A 03/29/2018   Procedure: INTRAVASCULAR PRESSURE WIRE/FFR STUDY;  Surgeon: Yvonne Kendall, MD;  Location: [facility];  Service: Cardiovascular;  Laterality: N/A;   KNEE ARTHROSCOPY     Rt. Knee   LEFT HEART CATH AND CORONARY ANGIOGRAPHY N/A 03/29/2018   Procedure: LEFT HEART CATH AND CORONARY ANGIOGRAPHY;  Surgeon: Yvonne Kendall, MD;  Location: [facility];  Service: Cardiovascular;  Laterality: N/A;   LUMBAR LAMINECTOMY/DECOMPRESSION MICRODISCECTOMY Left 01/03/2020   Procedure: Left L2-3 decompression disectomy, Insitu Fusion Lumbar 2-3;  Surgeon: Venita Lick, MD;  Location: [facility];  Service: Orthopedics;  Laterality: Left;   POLYPECTOMY     TONSILLECTOMY     TONSILLECTOMY     VAGINAL HYSTERECTOMY  1994    Social History   Socioeconomic History   Marital status: Married    Spouse name: Not on file   Number of children: 2   Years of education: Not on file   Highest education level: Not on file  Occupational History   Occupation: Retired Magazine features editor: RETIRED  Tobacco Use   Smoking status: Former    Types: Cigarettes    Quit date: 04/12/1977    Years since quitting: 45.3   Smokeless tobacco: Never   Tobacco comments:    quit age 42  Vaping Use   Vaping Use: Never used  Substance and Sexual Activity   Alcohol use: Not Currently    Alcohol/week: 0.0 standard drinks of alcohol    Comment: rarely   Drug use: No   Sexual activity: Not Currently  Other Topics  Concern   Not on file  Social History Narrative   Not on file   Social Determinants of Health   Financial Resource Strain: Low Risk  (05/31/2022)   Overall Financial Resource Strain (CARDIA)    Difficulty of Paying Living Expenses: Not hard at all  Food Insecurity: No Food Insecurity (05/31/2022)   Hunger Vital Sign    Worried About Running Out of Food in the Last Year: Never true    Ran Out of Food in the Last Year: Never true  Transportation Needs: No Transportation Needs (05/31/2022)  PRAPARE - Administrator, Civil ServiceTransportation    Lack of Transportation (Medical): No    Lack of Transportation (Non-Medical): No  Physical Activity: Sufficiently Active (05/31/2022)   Exercise Vital Sign    Days of Exercise per Week: 5 days    Minutes of Exercise per Session: 30 min  Stress: No Stress Concern Present (05/31/2022)   [employer] - Occupational Stress Questionnaire    Feeling of Stress : Not at all  Social Connections: Moderately Isolated (05/31/2022)   Social Connection and Isolation Panel [NHANES]    Frequency of Communication with Friends and Family: More than three times a week    Frequency of Social Gatherings with Friends and Family: More than three times a week    Attends Religious Services: Never    Database administratorActive Member of Clubs or Organizations: No    Attends BankerClub or Organization Meetings: Never    Marital Status: Married  Catering managerntimate Partner Violence: Not At Risk (05/31/2022)   Humiliation, Afraid, Rape, and Kick questionnaire    Fear of Current or Ex-Partner: No    Emotionally Abused: No    Physically Abused: No    Sexually Abused: No    Family History  Problem Relation Age of Onset   Heart disease Mother    Heart disease Father        CABG   Heart disease Sister    Heart disease Brother        2 stents   Heart disease Maternal Grandmother    Heart disease Maternal Grandfather    Heart disease Paternal Grandmother    Heart disease Paternal Grandfather     Ulcerative colitis Daughter    Colon cancer Neg Hx    Colon polyps Neg Hx    Esophageal cancer Neg Hx    Rectal cancer Neg Hx    Stomach cancer Neg Hx     ROS: no fevers or chills, productive cough, hemoptysis, dysphasia, odynophagia, melena, hematochezia, dysuria, hematuria, rash, seizure activity, orthopnea, PND, pedal edema, claudication. Remaining systems are negative.  Physical Exam: Well-developed well-nourished in no acute distress.  Skin is warm and dry.  HEENT is normal.  Neck is supple.  Chest is clear to auscultation with normal expansion.  Cardiovascular exam is regular rate and rhythm.  2/6 systolic murmur left sternal border. Abdominal exam nontender or distended. No masses palpated. Extremities show no edema. neuro grossly intact   A/P  1 aortic stenosis/aortic insufficiency-will plan repeat echocardiogram November 2024.  Patient understands she will likely require aortic valve replacement in the future.  We have discussed the symptoms to be aware of including increased dyspnea on exertion, chest pain or syncope.  2 paroxysmal atrial fibrillation-patient remains in sinus rhythm on exam.  Continue metoprolol for rate control if atrial fibrillation recurs.  Continue apixaban.  3 coronary artery disease-patient has had no recurrent chest pain since last office visit.  Previous functional study showed no ischemia.  Continue statin.  No aspirin given need for anticoagulation.  4 hypertension-blood pressure controlled.  Continue present medications and follow-up.  5 hyperlipidemia-continue statin.  Most recent LDL 52.  Olga MillersBrian Archie Shea, MD

## 2022-08-03 ENCOUNTER — Encounter: Payer: Self-pay | Admitting: Cardiology

## 2022-08-03 ENCOUNTER — Ambulatory Visit: Payer: Medicare Other | Attending: Cardiology | Admitting: Cardiology

## 2022-08-03 VITALS — BP 110/76 | HR 69 | Ht 66.0 in | Wt 174.2 lb

## 2022-08-03 DIAGNOSIS — I25119 Atherosclerotic heart disease of native coronary artery with unspecified angina pectoris: Secondary | ICD-10-CM | POA: Diagnosis not present

## 2022-08-03 DIAGNOSIS — E785 Hyperlipidemia, unspecified: Secondary | ICD-10-CM | POA: Insufficient documentation

## 2022-08-03 DIAGNOSIS — I350 Nonrheumatic aortic (valve) stenosis: Secondary | ICD-10-CM | POA: Insufficient documentation

## 2022-08-03 DIAGNOSIS — I480 Paroxysmal atrial fibrillation: Secondary | ICD-10-CM | POA: Insufficient documentation

## 2022-08-03 DIAGNOSIS — I10 Essential (primary) hypertension: Secondary | ICD-10-CM | POA: Diagnosis not present

## 2022-08-03 NOTE — Patient Instructions (Signed)
    Follow-Up: At [facility], you and your health needs are our priority.  As part of our continuing mission to provide you with exceptional heart care, we have created designated Provider Care Teams.  These Care Teams include your primary Cardiologist (physician) and Advanced Practice Providers (APPs -  Physician Assistants and Nurse Practitioners) who all work together to provide you with the care you need, when you need it.  We recommend signing up for the patient portal called "MyChart".  Sign up information is provided on this After Visit Summary.  MyChart is used to connect with patients for Virtual Visits (Telemedicine).  Patients are able to view lab/test results, encounter notes, upcoming appointments, etc.  Non-urgent messages can be sent to your provider as well.   To learn more about what you can do with MyChart, go to [email or web address].    Your next appointment:   6 month(s)  Provider:   Brian Crenshaw, MD      

## 2022-10-04 ENCOUNTER — Other Ambulatory Visit: Payer: Self-pay | Admitting: Cardiology

## 2022-10-28 ENCOUNTER — Telehealth: Payer: Self-pay | Admitting: Cardiology

## 2022-10-28 DIAGNOSIS — I350 Nonrheumatic aortic (valve) stenosis: Secondary | ICD-10-CM

## 2022-10-28 NOTE — Telephone Encounter (Signed)
New Message:      Patient says she usally have an Echo before her yearly appointment. She would like for Dr Jens Som to please order it before her appointment in October.

## 2022-10-28 NOTE — Telephone Encounter (Signed)
Patient would like echo order prior to appt.  Please advise and/or order

## 2022-10-29 NOTE — Telephone Encounter (Signed)
Left message for patient with Dr Ronnald Nian recommendations.   Order placed

## 2022-11-10 NOTE — Telephone Encounter (Signed)
Left message for pt to call to schedule echo.

## 2022-11-25 ENCOUNTER — Other Ambulatory Visit: Payer: Self-pay | Admitting: Nurse Practitioner

## 2022-12-08 ENCOUNTER — Encounter: Payer: Self-pay | Admitting: Cardiology

## 2022-12-08 ENCOUNTER — Telehealth: Payer: Self-pay | Admitting: Cardiology

## 2022-12-08 NOTE — Telephone Encounter (Signed)
   Patient Name: Anne Martin  DOB: 24-Jun-1945 MRN: [id number]  Primary Cardiologist: Olga Millers, MD  Chart reviewed as part of pre-operative protocol coverage.   Dental extractions of 1-2 teeth are considered low risk procedures per guidelines and generally do not require any specific cardiac clearance. It is also generally accepted that for extractions of 1-2 teeth and dental cleanings, there is no need to interrupt blood thinner therapy.  SBE prophylaxis is not required for the patient from a cardiac standpoint.  I will route this recommendation to the requesting party via Epic fax function and remove from pre-op pool.  Please call with questions.  Carlos Levering, NP 12/08/2022, 1:15 PM

## 2022-12-08 NOTE — Telephone Encounter (Signed)
   Pre-operative Risk Assessment    Patient Name: Anne Martin  DOB: 11-Apr-1946 MRN: [id number]     Request for Surgical Clearance    Procedure:   Dental Extraction tooth #12 and a Bridge  Date of Surgery:  Clearance 12/15/22                                 Surgeon: Sharman Crate Surgeon's Group or Practice Name:  [facility] Phone number:  [phone] Fax number:  [phone]   Type of Clearance Requested:   - Medical  - Pharmacy:  Hold        Type of Anesthesia:   Articaine with 4% epinephrine   Additional requests/questions:  Please advise surgeon/provider what medications should be held.  [name]   12/08/2022, 1:01 PM

## 2023-01-10 ENCOUNTER — Other Ambulatory Visit (HOSPITAL_BASED_OUTPATIENT_CLINIC_OR_DEPARTMENT_OTHER): Payer: Self-pay

## 2023-01-10 MED ORDER — COVID-19 MRNA VAC-TRIS(PFIZER) 30 MCG/0.3ML IM SUSY
0.3000 mL | PREFILLED_SYRINGE | Freq: Once | INTRAMUSCULAR | 0 refills | Status: AC
  Filled 2023-01-10: qty 0.3, 1d supply, fill #0

## 2023-01-10 MED ORDER — INFLUENZA VAC A&B SURF ANT ADJ 0.5 ML IM SUSY
0.5000 mL | PREFILLED_SYRINGE | Freq: Once | INTRAMUSCULAR | 0 refills | Status: AC
  Filled 2023-01-10: qty 0.5, 1d supply, fill #0

## 2023-01-12 ENCOUNTER — Other Ambulatory Visit: Payer: Self-pay | Admitting: Cardiology

## 2023-03-01 ENCOUNTER — Encounter: Payer: Self-pay | Admitting: Family Medicine

## 2023-03-01 ENCOUNTER — Ambulatory Visit (INDEPENDENT_AMBULATORY_CARE_PROVIDER_SITE_OTHER): Payer: Medicare Other | Admitting: Family Medicine

## 2023-03-01 VITALS — BP 124/70 | HR 55 | Temp 98.2°F | Wt 174.0 lb

## 2023-03-01 DIAGNOSIS — J020 Streptococcal pharyngitis: Secondary | ICD-10-CM | POA: Diagnosis not present

## 2023-03-01 MED ORDER — CEFUROXIME AXETIL 500 MG PO TABS: 500.0000 mg | ORAL_TABLET | Freq: Two times a day (BID) | ORAL | 0 refills | Status: AC

## 2023-03-01 NOTE — Progress Notes (Signed)
   Subjective:    Patient ID: SERRINA VIRGIN, female    DOB: 1945/10/07, 77 y.o.   MRN: [id number]  HPI Here for lingering symptoms of ST, headaches, and body aches, these began about 4 weeks ago while she was in [place]. She saw an urgent care there about 2 weeks ago, and she tested positive for Strep. They treated her with Penicillin for 10 days, and she felt a little better. Then a few days ago the symptoms returned. No coughing.    Review of Systems  Constitutional:  Positive for fatigue. Negative for fever.  HENT:  Positive for congestion and sore throat. Negative for ear pain, postnasal drip and sinus pain.   Eyes: Negative.   Respiratory: Negative.         Objective:   Physical Exam Constitutional:      Appearance: Normal appearance.  HENT:     Right Ear: Tympanic membrane, ear canal and external ear normal.     Left Ear: Tympanic membrane, ear canal and external ear normal.     Nose: Nose normal.     Mouth/Throat:     Pharynx: Oropharynx is clear.  Eyes:     Conjunctiva/sclera: Conjunctivae normal.  Pulmonary:     Effort: Pulmonary effort is normal.     Breath sounds: Normal breath sounds.  Lymphadenopathy:     Cervical: No cervical adenopathy.  Neurological:     Mental Status: She is alert.           Assessment & Plan:  She likely has a partially treated strep infection. We will give her 14 days of Cefuroxime. Recheck as needed.  Gershon Crane, MD

## 2023-03-07 ENCOUNTER — Ambulatory Visit (HOSPITAL_COMMUNITY): Payer: Medicare Other | Attending: Cardiology

## 2023-03-07 ENCOUNTER — Ambulatory Visit: Payer: Medicare Other | Admitting: Adult Health

## 2023-03-07 DIAGNOSIS — I350 Nonrheumatic aortic (valve) stenosis: Secondary | ICD-10-CM | POA: Insufficient documentation

## 2023-03-07 LAB — ECHOCARDIOGRAM COMPLETE
AR max vel: 0.9 cm2
AV Area VTI: 0.91 cm2
AV Area mean vel: 0.88 cm2
AV Mean grad: 14.8 mm[Hg]
AV Peak grad: 27.7 mm[Hg]
Ao pk vel: 2.63 m/s
Area-P 1/2: 3.28 cm2
P 1/2 time: 534 ms
S' Lateral: 2.3 cm

## 2023-03-15 ENCOUNTER — Encounter: Payer: Self-pay | Admitting: *Deleted

## 2023-03-15 ENCOUNTER — Other Ambulatory Visit: Payer: Self-pay | Admitting: *Deleted

## 2023-03-15 DIAGNOSIS — I350 Nonrheumatic aortic (valve) stenosis: Secondary | ICD-10-CM

## 2023-03-16 ENCOUNTER — Other Ambulatory Visit: Payer: Self-pay | Admitting: Cardiology

## 2023-03-16 ENCOUNTER — Encounter: Payer: Self-pay | Admitting: Emergency Medicine

## 2023-03-16 ENCOUNTER — Ambulatory Visit: Payer: Medicare Other | Attending: Adult Health | Admitting: Emergency Medicine

## 2023-03-16 VITALS — BP 118/78 | HR 57 | Ht 65.0 in | Wt 180.0 lb

## 2023-03-16 DIAGNOSIS — I480 Paroxysmal atrial fibrillation: Secondary | ICD-10-CM | POA: Diagnosis present

## 2023-03-16 DIAGNOSIS — I25119 Atherosclerotic heart disease of native coronary artery with unspecified angina pectoris: Secondary | ICD-10-CM | POA: Insufficient documentation

## 2023-03-16 DIAGNOSIS — I10 Essential (primary) hypertension: Secondary | ICD-10-CM | POA: Insufficient documentation

## 2023-03-16 DIAGNOSIS — I350 Nonrheumatic aortic (valve) stenosis: Secondary | ICD-10-CM | POA: Diagnosis present

## 2023-03-16 DIAGNOSIS — I351 Nonrheumatic aortic (valve) insufficiency: Secondary | ICD-10-CM | POA: Diagnosis present

## 2023-03-16 DIAGNOSIS — E785 Hyperlipidemia, unspecified: Secondary | ICD-10-CM | POA: Insufficient documentation

## 2023-03-16 NOTE — Patient Instructions (Signed)
Medication Instructions:  The current medical regimen is effective;  continue present plan and medications as directed. Please refer to the Current Medication list given to you today.  *If you need a refill on your cardiac medications before your next appointment, please call your pharmacy*  Lab Work: NONE If you have labs (blood work) drawn today and your tests are completely normal, you will receive your results only by:  MyChart Message (if you have MyChart) OR  A paper copy in the mail If you have any lab test that is abnormal or we need to change your treatment, we will call you to review the results.  Testing/Procedures: NONE  Follow-Up: At [facility], you and your health needs are our priority.  As part of our continuing mission to provide you with exceptional heart care, we have created designated Provider Care Teams.  These Care Teams include your primary Cardiologist (physician) and Advanced Practice Providers (APPs -  Physician Assistants and Nurse Practitioners) who all work together to provide you with the care you need, when you need it.  Your next appointment:   6 month(s)  Provider:   Olga Millers, MD  or  Rise Paganini, NP        Other Instructions SEE ATTACHED

## 2023-03-16 NOTE — Progress Notes (Signed)
Cardiology Office Note:    Date:  03/16/2023  ID:  ILLANA BOYCE, DOB March 01, 1946, MRN [id number] PCP: Nelwyn Salisbury, MD  [facility] Providers Cardiologist:  Olga Millers, MD       Patient Profile:      Kwana Reczek is a 77 year old female with history of coronary artery disease, aortic stenosis/aortic insufficiency, hypertension, hyperlipidemia, and atrial fibrillation.  Cardiac catheterization performed May 2011 showing 60% LAD stenosis, 90% D2 stenosis, 50-60% LCx stenosis, normal RCA, normal LV function.  She had PCI of the D2 with DES.  Carotid Dopplers in October 2013 were normal.  Cardiac catheterization December 2019 showed nonobstructive CAD with moderate mLAD stenosis and moderate in-stent restenosis in D2 and no disease in the circumflex or right coronary artery.  The ascending aorta was mildly dilated.  Echocardiogram August 2020 with normal LVEF and mild stenosis of aortic valve. CTA chest/aorta August 2021 showed no evidence of thoracic aortic aneurysm.  Monitor April 2022 showed sinus rhythm with occasional PAC, brief PAT and PVC.  She was found to have atrial fibrillation in June 2023.  Her metoprolol was increased and Eliquis was added to her medications at this time.  Monitor July 2023 showed sinus rhythm with PACs, brief runs of PAT, occasional PVC.  PET stress on August 2023 showed ejection fraction 71% and normal perfusion.  Echocardiogram November 2023 showed normal LV function, mild LVH, grade 1 DD, mild AI, mild to moderate aortic stenosis with mean gradient 13 mmHg.  Echocardiogram November 2024 with LVEF 60 to 65%, no RWMA, RV SF normal, left atrial size mildly dilated, mild to moderate mitral valve regurgitation, mild to moderate aortic valve regurgitation, mild aortic valve stenosis, gradients stable compared to echo 03/01/2022.       History of Present Illness:   TRULEE BATES is a 77 y.o. female who returns for her 13-month follow-up visit for aortic  insufficiency, CAD, HTN, HLD, PAF.   Today patient notes that she is doing well overall.  She notes over the past 6 months she has had no major or minor cardiovascular events.  She notes that she exercises some however would like to be more active overall.  She notes that her diet has not been all that great recently and she has noticed some weight gain.  She she would like to begin dieting but notes that has been difficult over the holidays.  She does have DOE mostly when she exercises or walks up hills.  Her dyspnea is resolved by rest.  She is not having any chest pain at rest or exertion.  She denies any irregular heartbeats or palpitations, she wears an Scientist, physiological and has a Kardia device at home and denies any episodes of atrial fibrillation.  She denies falls, syncope, dizziness, blurred vision.        Review of Systems  Constitutional: Negative for weight gain and weight loss.  Cardiovascular:  Positive for dyspnea on exertion. Negative for chest pain, claudication, irregular heartbeat, leg swelling, near-syncope, orthopnea, palpitations, paroxysmal nocturnal dyspnea and syncope.  Respiratory:  Negative for cough, hemoptysis and shortness of breath.   Gastrointestinal:  Negative for abdominal pain, hematochezia and melena.  Genitourinary:  Negative for hematuria.  Neurological:  Negative for excessive daytime sleepiness, headaches and light-headedness.     See HPI    Studies Reviewed:   EKG Interpretation Date/Time:  Wednesday March 16 2023 08:20:05 EST Ventricular Rate:  66 PR Interval:  168 QRS Duration:  80 QT  Interval:  384 QTC Calculation: 402 R Axis:   74  Text Interpretation: Sinus rhythm with marked sinus arrhythmia Confirmed by Rise Paganini [phone]) on 03/16/2023 8:56:10 AM    Echocardiogram 03/07/2023 1. Left ventricular ejection fraction, by estimation, is 60 to 65%. The  left ventricle has normal function. The left ventricle has no regional  wall motion  abnormalities. Left ventricular diastolic parameters were  normal.   2. Right ventricular systolic function is normal. The right ventricular  size is normal.   3. Left atrial size was mildly dilated.   4. The mitral valve is abnormal. Mild to moderate mitral valve  regurgitation. No evidence of mitral stenosis.   5. Gradients stable compared to echo 03/08/22. The aortic valve is  tricuspid. There is moderate calcification of the aortic valve. Aortic  valve regurgitation is mild to moderate. Mild aortic valve stenosis.   6. The inferior vena cava is normal in size with greater than 50%  respiratory variability, suggesting right atrial pressure of 3 mmHg.   Echocardiogram 03/08/2022 1. Left ventricular ejection fraction, by estimation, is 60 to 65%. The  left ventricle has normal function. The left ventricle has no regional  wall motion abnormalities. There is mild concentric left ventricular  hypertrophy. Left ventricular diastolic  parameters are consistent with Grade I diastolic dysfunction (impaired  relaxation). Elevated left ventricular end-diastolic pressure. The average  left ventricular global longitudinal strain is -22.5 %. The global  longitudinal strain is normal.   2. Right ventricular systolic function is normal. The right ventricular  size is normal. There is normal pulmonary artery systolic pressure. The  estimated right ventricular systolic pressure is 27.4 mmHg.   3. The mitral valve is normal in structure. Trivial mitral valve  regurgitation. No evidence of mitral stenosis.   4. The aortic valve is tricuspid. Aortic valve regurgitation is mild.  Mild to moderate aortic valve stenosis. Aortic regurgitation PHT measures  1029 msec. Aortic valve mean gradient measures 13.0 mmHg. Aortic valve  Vmax measures 2.46 m/s. Aortic valve  area, by VTI measures 1.10 cm.      DVI 0.49. SVI low at 37. The AVA is underestimated due to small LVOT  diameter measurement.   5. The  inferior vena cava is normal in size with greater than 50%  respiratory variability, suggesting right atrial pressure of 3 mmHg.   6. Compared to study dated 03/09/2021, No significant change.  PET Stress 12/01/2021   The study is normal. The study is low risk.   LV perfusion is normal. There is no evidence of ischemia. There is no evidence of infarction.   Rest left ventricular function is normal. Rest EF: 71 %. Stress left ventricular function is normal. Stress EF: 75 %. End diastolic cavity size is normal. End systolic cavity size is normal. No evidence of transient ischemic dilation (TID) noted.   Myocardial blood flow was computed to be 0.71ml/g/min at rest and 2.41ml/g/min at stress. Global myocardial blood flow reserve was 3.07 and was normal.   Coronary calcium assessment not performed due to prior revascularization.  14-day ZIO 09/25/2021 Patient had a min HR of 46 bpm, max HR of 190 bpm, and avg HR of 64 bpm. Predominant underlying rhythm was Sinus Rhythm. 271 Supraventricular Tachycardia runs occurred, the run with the fastest interval lasting 5 beats with a max rate of 190 bpm, the  longest lasting 15.4 secs with an avg rate of 113 bpm. Some episodes of Supraventricular Tachycardia may be possible Atrial Tachycardia  with variable block. Isolated SVEs were occasional (3.8%, 48212), SVE Couplets were rare (<1.0%, 5491), and SVE  Triplets were rare (<1.0%, 1755). Isolated VEs were rare (<1.0%), VE Couplets were rare (<1.0%), and no VE Triplets were present. Ventricular Bigeminy and Trigeminy were present.  LHC 03/29/2018 Non-obstructive coronary artery disease with moderate mid LAD disease (DFR 0.93) and moderate D2 in-stent restenosis (DFR 0.93).  No significant disease involving the LCx and RCA. Normal left ventricular systolic function and left ventricular filling pressure. Mild aortic stenosis and regurgitation. Mildly dilated ascending aorta. Diagnostic Dominance: Right  Risk  Assessment/Calculations:    CHA2DS2-VASc Score = 5   This indicates a 7.2% annual risk of stroke. The patient's score is based upon: CHF History: 0 HTN History: 1 Diabetes History: 0 Stroke History: 0 Vascular Disease History: 1 Age Score: 2 Gender Score: 1            Physical Exam:   VS:  BP 118/78 (BP Location: Left Arm, Patient Position: Sitting, Cuff Size: Normal)   Pulse (!) 57   Ht 5\' 5"  (1.651 m)   Wt 180 lb (81.6 kg)   LMP  (LMP Unknown)   SpO2 97%   BMI 29.95 kg/m    Wt Readings from Last 3 Encounters:  03/16/23 180 lb (81.6 kg)  03/01/23 174 lb (78.9 kg)  08/03/22 174 lb 3.2 oz (79 kg)    Constitutional:      Appearance: Normal and healthy appearance.  HENT:     Head: Normocephalic.  Neck:     Vascular: JVD normal.  Pulmonary:     Effort: Pulmonary effort is normal.     Breath sounds: Normal breath sounds.  Chest:     Chest wall: Not tender to palpatation.  Cardiovascular:     PMI at left midclavicular line. Normal rate. Regular rhythm. Normal S1. Normal S2.      Murmurs: There is a grade 2/6 harsh midsystolic murmur at the URSB.     No gallop.  No click. No rub.  Pulses:    Intact distal pulses.  Edema:    Peripheral edema absent.  Musculoskeletal: Normal range of motion.     Cervical back: Normal range of motion and neck supple. Skin:    General: Skin is warm and dry.  Neurological:     General: No focal deficit present.     Mental Status: Alert, oriented to person, place, and time and oriented to person, place and time.  Psychiatric:        Attention and Perception: Attention and perception normal.        Mood and Affect: Mood normal.        Behavior: Behavior is cooperative.        Thought Content: Thought content normal.       Assessment and Plan:  Aortic stenosis/aortic insufficiency -Echo 03/07/2023 showing mild aortic valve regurgitation and mild to moderate aortic valve stenosis.  Gradients are stable compared to her echo on  03/08/2022.  She has very mild DOE otherwise asymptomatic.  No CP, syncope.  -Repeat in 1 year 03/01/2024.  Order placed in chart  Paroxysmal atrial fibrillation -EKG today normal sinus rhythm -No recent reports of going into A-fib.  Monitors with Apple Watch and Kardia device -Continue Eliquis 5 mg twice daily, she does not meet dose reduction qualifications.  Most recent CBC unremarkable -Continue metoprolol succinate 75 mg daily for rate control  Coronary artery disease -PCI/DES to D2 08/2009 -LHC 03/2018 w/ moderate mLAD  stenosis, moderate in-stent restenosis in D2 -Stable with no anginal symptoms, no indication for ischemic evaluation  -Continue atorvastatin 80 mg, Imdur 30 mg  -No ASA due to [facility]  Hypertension -BP today 118/78.  Excellent control -Continue metoprolol, Imdur -Attempt to exercise 150 minutes of moderate intensity weekly  Hyperlipidemia, LDL goal < 70 -TC 135, HDL 65, LDL 52 on 03/24/2022.  Well-controlled.  -She will have labs repeated by PCP in coming month -Encouraged heart healthy and Mediterranean dieting.  Focus on increasing fiber, vegetables, fruit and diet.  Decrease processed foods and foods high in sugar.              Dispo:  Return in about 6 months (around 09/14/2023).  Signed, Denyce Robert, NP

## 2023-03-17 NOTE — Telephone Encounter (Signed)
Prescription refill request for Eliquis received. Indication: afib  Last office visit: [place] 03/16/2023 Scr: 0.82, 03/24/2022 Age: 77 yo  Weight: 81.6 kg   Pt is due for blood work. Pt is having annual physical later this month.

## 2023-03-18 ENCOUNTER — Ambulatory Visit (INDEPENDENT_AMBULATORY_CARE_PROVIDER_SITE_OTHER): Payer: Medicare Other | Admitting: Family Medicine

## 2023-03-18 ENCOUNTER — Encounter: Payer: Self-pay | Admitting: Family Medicine

## 2023-03-18 VITALS — BP 118/64 | HR 75 | Temp 97.6°F | Wt 180.0 lb

## 2023-03-18 DIAGNOSIS — J020 Streptococcal pharyngitis: Secondary | ICD-10-CM | POA: Diagnosis not present

## 2023-03-18 NOTE — Progress Notes (Signed)
   Subjective:    Patient ID: DORIANN CONSTANTINO, female    DOB: 26-Jul-1945, 77 y.o.   MRN: [id number]  HPI Here to follow up on a strep pharyngitis. We saw her on 03-01-23 for this when she was having fevers, ST, and body aches. We gave her 14 days of Cefuroxime, and this worked well. Today she feels back to normal.    Review of Systems  Constitutional: Negative.   HENT: Negative.    Respiratory: Negative.         Objective:   Physical Exam Constitutional:      Appearance: Normal appearance.  HENT:     Mouth/Throat:     Pharynx: Oropharynx is clear.  Eyes:     Conjunctiva/sclera: Conjunctivae normal.  Cardiovascular:     Rate and Rhythm: Normal rate and regular rhythm.     Pulses: Normal pulses.     Heart sounds: Normal heart sounds.  Pulmonary:     Effort: Pulmonary effort is normal.     Breath sounds: Normal breath sounds.  Lymphadenopathy:     Cervical: No cervical adenopathy.  Neurological:     Mental Status: She is alert.           Assessment & Plan:  She has recovered from a strep pharyngitis. Recheck as needed.  Gershon Crane, MD

## 2023-03-27 ENCOUNTER — Other Ambulatory Visit: Payer: Self-pay | Admitting: Cardiology

## 2023-03-28 ENCOUNTER — Ambulatory Visit (INDEPENDENT_AMBULATORY_CARE_PROVIDER_SITE_OTHER): Payer: Medicare Other | Admitting: Family Medicine

## 2023-03-28 ENCOUNTER — Encounter: Payer: Self-pay | Admitting: Family Medicine

## 2023-03-28 VITALS — BP 110/62 | HR 61 | Temp 97.7°F | Ht 65.0 in | Wt 180.0 lb

## 2023-03-28 DIAGNOSIS — I2510 Atherosclerotic heart disease of native coronary artery without angina pectoris: Secondary | ICD-10-CM

## 2023-03-28 DIAGNOSIS — I480 Paroxysmal atrial fibrillation: Secondary | ICD-10-CM

## 2023-03-28 DIAGNOSIS — R739 Hyperglycemia, unspecified: Secondary | ICD-10-CM

## 2023-03-28 DIAGNOSIS — I350 Nonrheumatic aortic (valve) stenosis: Secondary | ICD-10-CM

## 2023-03-28 DIAGNOSIS — E7800 Pure hypercholesterolemia, unspecified: Secondary | ICD-10-CM | POA: Diagnosis not present

## 2023-03-28 DIAGNOSIS — E559 Vitamin D deficiency, unspecified: Secondary | ICD-10-CM

## 2023-03-28 DIAGNOSIS — M8580 Other specified disorders of bone density and structure, unspecified site: Secondary | ICD-10-CM

## 2023-03-28 DIAGNOSIS — I10 Essential (primary) hypertension: Secondary | ICD-10-CM | POA: Diagnosis not present

## 2023-03-28 LAB — BASIC METABOLIC PANEL
BUN: 19 mg/dL (ref 6–23)
CO2: 29 meq/L (ref 19–32)
Calcium: 9.4 mg/dL (ref 8.4–10.5)
Chloride: 105 meq/L (ref 96–112)
Creatinine, Ser: 0.83 mg/dL (ref 0.40–1.20)
GFR: 67.68 mL/min (ref >60.00)
Glucose, Bld: 99 mg/dL (ref 70–99)
Potassium: 4 meq/L (ref 3.5–5.1)
Sodium: 142 meq/L (ref 135–145)

## 2023-03-28 LAB — HEPATIC FUNCTION PANEL
ALT: 38 U/L — ABNORMAL HIGH (ref 0–35)
AST: 33 U/L (ref 0–37)
Albumin: 4.3 g/dL (ref 3.5–5.2)
Alkaline Phosphatase: 128 U/L — ABNORMAL HIGH (ref 39–117)
Bilirubin, Direct: 0.2 mg/dL (ref 0.0–0.3)
Total Bilirubin: 0.9 mg/dL (ref 0.2–1.2)
Total Protein: 6.7 g/dL (ref 6.0–8.3)

## 2023-03-28 LAB — CBC WITH DIFFERENTIAL/PLATELET
Basophils Absolute: 0 10*3/uL (ref 0.0–0.1)
Basophils Relative: 0.7 % (ref 0.0–3.0)
Eosinophils Absolute: 0.2 10*3/uL (ref 0.0–0.7)
Eosinophils Relative: 3.1 % (ref 0.0–5.0)
HCT: 47.2 % — ABNORMAL HIGH (ref 36.0–46.0)
Hemoglobin: 15.2 g/dL — ABNORMAL HIGH (ref 12.0–15.0)
Lymphocytes Relative: 26.1 % (ref 12.0–46.0)
Lymphs Abs: 1.9 10*3/uL (ref 0.7–4.0)
MCHC: 32.1 g/dL (ref 30.0–36.0)
MCV: 82 fL (ref 78.0–100.0)
Monocytes Absolute: 0.6 10*3/uL (ref 0.1–1.0)
Monocytes Relative: 8 % (ref 3.0–12.0)
Neutro Abs: 4.5 10*3/uL (ref 1.4–7.7)
Neutrophils Relative %: 62.1 % (ref 43.0–77.0)
Platelets: 233 10*3/uL (ref 150.0–400.0)
RBC: 5.75 Mil/uL — ABNORMAL HIGH (ref 3.87–5.11)
RDW: 15.9 % — ABNORMAL HIGH (ref 11.5–15.5)
WBC: 7.3 10*3/uL (ref 4.0–10.5)

## 2023-03-28 LAB — LIPID PANEL
Cholesterol: 119 mg/dL (ref 0–200)
HDL: 57.3 mg/dL (ref >39.00)
LDL Cholesterol: 46 mg/dL (ref 0–99)
NonHDL: 62.11
Total CHOL/HDL Ratio: 2
Triglycerides: 83 mg/dL (ref 0.0–149.0)
VLDL: 16.6 mg/dL (ref 0.0–40.0)

## 2023-03-28 LAB — HEMOGLOBIN A1C: Hgb A1c MFr Bld: 5.8 % (ref 4.6–6.5)

## 2023-03-28 LAB — TSH: TSH: 2.8 u[IU]/mL (ref 0.35–5.50)

## 2023-03-28 LAB — VITAMIN D 25 HYDROXY (VIT D DEFICIENCY, FRACTURES): VITD: 84.7 ng/mL (ref 30.00–100.00)

## 2023-03-28 NOTE — Progress Notes (Signed)
   Subjective:    Patient ID: Anne Martin, female    DOB: [birth year], 77 y.o.   MRN: [id number]  HPI Here to follow up on issues. She feels well I general. Her BP has been stable. Her CAD has been stable. She saw Cardiology recently and she was in sinus rhythm. She continues on Eliquis. She watches her diet and exercises.    Review of Systems  Constitutional: Negative.   HENT: Negative.    Eyes: Negative.   Respiratory: Negative.    Cardiovascular: Negative.   Gastrointestinal: Negative.   Genitourinary:  Negative for decreased urine volume, difficulty urinating, dyspareunia, dysuria, enuresis, flank pain, frequency, hematuria, pelvic pain and urgency.  Musculoskeletal: Negative.   Skin: Negative.   Neurological: Negative.  Negative for headaches.  Psychiatric/Behavioral: Negative.         Objective:   Physical Exam Constitutional:      General: She is not in acute distress.    Appearance: She is well-developed.  HENT:     Head: Normocephalic and atraumatic.     Right Ear: External ear normal.     Left Ear: External ear normal.     Nose: Nose normal.     Mouth/Throat:     Pharynx: No oropharyngeal exudate.  Eyes:     General: No scleral icterus.    Conjunctiva/sclera: Conjunctivae normal.     Pupils: Pupils are equal, round, and reactive to light.  Neck:     Thyroid: No thyromegaly.     Vascular: No JVD.  Cardiovascular:     Rate and Rhythm: Normal rate. Rhythm irregular.     Pulses: Normal pulses.     Heart sounds: Normal heart sounds. No murmur heard.    No friction rub. No gallop.  Pulmonary:     Effort: Pulmonary effort is normal. No respiratory distress.     Breath sounds: Normal breath sounds. No wheezing or rales.  Chest:     Chest wall: No tenderness.  Abdominal:     General: Bowel sounds are normal. There is no distension.     Palpations: Abdomen is soft. There is no mass.     Tenderness: There is no abdominal tenderness. There is no guarding or  rebound.  Musculoskeletal:        General: No tenderness. Normal range of motion.     Cervical back: Normal range of motion and neck supple.  Lymphadenopathy:     Cervical: No cervical adenopathy.  Skin:    General: Skin is warm and dry.     Findings: No erythema or rash.  Neurological:     General: No focal deficit present.     Mental Status: She is alert and oriented to person, place, and time.     Cranial Nerves: No cranial nerve deficit.     Motor: No abnormal muscle tone.     Coordination: Coordination normal.     Deep Tendon Reflexes: Reflexes are normal and symmetric. Reflexes normal.  Psychiatric:        Mood and Affect: Mood normal.        Behavior: Behavior normal.        Thought Content: Thought content normal.        Judgment: Judgment normal.           Assessment & Plan:  Her HTN and PAF are stable. We will get fasting labs to check lipids, vitamin D, etc.  Gershon Crane, MD

## 2023-04-25 ENCOUNTER — Other Ambulatory Visit: Payer: Self-pay | Admitting: Cardiology

## 2023-05-16 LAB — HM MAMMOGRAPHY

## 2023-06-10 ENCOUNTER — Ambulatory Visit: Payer: Medicare Other

## 2023-06-10 VITALS — Ht 65.0 in | Wt 180.0 lb

## 2023-06-10 DIAGNOSIS — Z0000 Encounter for general adult medical examination without abnormal findings: Secondary | ICD-10-CM

## 2023-06-10 NOTE — Patient Instructions (Addendum)
 Anne Martin , Thank you for taking time to come for your Medicare Wellness Visit. I appreciate your ongoing commitment to your health goals. Please review the following plan we discussed and let me know if I can assist you in the future.   Referrals/Orders/Follow-Ups/Clinician Recommendations:   This is a list of the screening recommended for you and due dates:  Health Maintenance  Topic Date Due   Hepatitis C Screening  Never done   COVID-19 Vaccine (5 - 2024-25 season) 03/07/2023   Medicare Annual Wellness Visit  06/09/2024   DTaP/Tdap/Td vaccine (2 - Td or Tdap) 11/12/2025   Pneumonia Vaccine  Completed   Flu Shot  Completed   DEXA scan (bone density measurement)  Completed   Zoster (Shingles) Vaccine  Completed   HPV Vaccine  Aged Out   Colon Cancer Screening  Discontinued    Advanced directives: (Copy Requested) Please bring a copy of your health care power of attorney and living will to the office to be added to your chart at your convenience.  Next Medicare Annual Wellness Visit scheduled for next year: Yes

## 2023-06-10 NOTE — Progress Notes (Signed)
 Subjective:   Anne Martin is a 78 y.o. female who presents for Medicare Annual (Subsequent) preventive examination.  Visit Complete: Virtual I connected with  Anne Martin on 06/10/23 by a audio enabled telemedicine application and verified that I am speaking with the correct person using two identifiers.  Patient Location: Home  Provider Location: Home Office  I discussed the limitations of evaluation and management by telemedicine. The patient expressed understanding and agreed to proceed.  Vital Signs: Because this visit was a virtual/telehealth visit, some criteria may be missing or patient reported. Any vitals not documented were not able to be obtained and vitals that have been documented are patient reported.    Cardiac Risk Factors include: advanced age (>62men, >79 women);hypertension     Objective:    Today's Vitals   06/10/23 0810  Weight: 180 lb (81.6 kg)  Height: 5\' 5"  (1.651 m)   Body mass index is 29.95 kg/m.     06/10/2023    8:16 AM 05/31/2022    8:30 AM 09/23/2021    1:24 PM 08/28/2021    9:46 PM 05/25/2021    8:17 AM 03/12/2021   10:07 AM 09/05/2020   10:22 AM  Advanced Directives  Does Patient Have a Medical Advance Directive? Yes Yes No No Yes Yes Yes  Type of Estate agent of [place];Living will Healthcare Power of [place];Living will   Healthcare Power of [place];Living will    Copy of Healthcare Power of Attorney in Chart? No - copy requested No - copy requested   No - copy requested      Current Medications (verified) Outpatient Encounter Medications as of 06/10/2023  Medication Sig   alendronate (FOSAMAX) 70 MG tablet Take 70 mg by mouth once a week.   atorvastatin (LIPITOR) 80 MG tablet TAKE 1 TABLET BY MOUTH EVERY DAY AT 6PM   calcium carbonate (OSCAL) 1500 (600 Ca) MG TABS tablet Take 600 mg of elemental calcium by mouth daily.   cholecalciferol (VITAMIN D) 1000 UNITS tablet Take 1,000 Units by mouth every evening.     ELIQUIS 5 MG TABS tablet TAKE 1 TABLET BY MOUTH TWICE A DAY   isosorbide mononitrate (IMDUR) 30 MG 24 hr tablet TAKE 1 TABLET BY MOUTH EVERY DAY   loratadine (CLARITIN) 10 MG tablet Take 10 mg by mouth daily.    metoprolol succinate (TOPROL-XL) 50 MG 24 hr tablet TAKE 1 TABLET BY MOUTH EVERY DAY WITH OR IMMEDIATELY FOLLOWING A MEAL   nitroGLYCERIN (NITROSTAT) 0.4 MG SL tablet Place 1 tablet (0.4 mg total) under the tongue every 5 (five) minutes as needed. MAX 3 doses   Probiotic Product (PROBIOTIC PO) Take 1 tablet by mouth daily.    vitamin C (ASCORBIC ACID) 500 MG tablet Take 500 mg by mouth at bedtime.   YUVAFEM 10 MCG TABS vaginal tablet Place 10 mcg vaginally 2 (two) times a week.    Zinc 50 MG TABS Take 50 mg by mouth.   No facility-administered encounter medications on file as of 06/10/2023.    Allergies (verified) Procaine and Procaine hcl   History: Past Medical History:  Diagnosis Date   Allergy    seasonal   Anemia    prior to hysterectomy, fibroids   Aortic insufficiency    a. 10/2015 Echo: mild to moderate AI.   Aortic stenosis    a. 10/2015 Echo: moderate AS.   Blood transfusion without reported diagnosis    with hysterectomy   CAD (coronary artery disease)  a. 08/2009 s/p cath after abnormal nuclear study; LM nl, LAD 60%, D2 90% (PCI/DES), LCX 50 -60%, RCA nl, nl EF;  b. 2012 MV: no ischemia/infarct;  c. 08/2015 MV: no ischemia/infarct, EF 70%.   Complication of anesthesia    Diastolic dysfunction    a. 10/2015 Echo: EF 55-60%, no rwma, Gr1 DD, mod AS, mild to mod AI.   Essential hypertension    preventive meds due to heart   H/O: rheumatic fever    Heart murmur    History of colonoscopy    Hyperlipidemia    preventative med for heart   Osteopenia    last DEXA 11-22-13    PONV (postoperative nausea and vomiting)    Past Surgical History:  Procedure Laterality Date   ANGIOPLASTY  08/2009   stents   AORTIC ARCH ANGIOGRAPHY N/A 03/29/2018   Procedure: AORTIC  ARCH ANGIOGRAPHY;  Surgeon: Yvonne Kendall, MD;  Location: [facility];  Service: Cardiovascular;  Laterality: N/A;   BACK SURGERY     11/2020   CHOLECYSTECTOMY  02/2003   COLONOSCOPY  08/06/2021   per Dr. Marina Goodell, clear, no repeats needed   CORONARY PRESSURE/FFR STUDY N/A 03/29/2018   Procedure: INTRAVASCULAR PRESSURE WIRE/FFR STUDY;  Surgeon: Yvonne Kendall, MD;  Location: [facility];  Service: Cardiovascular;  Laterality: N/A;   KNEE ARTHROSCOPY     Rt. Knee   LEFT HEART CATH AND CORONARY ANGIOGRAPHY N/A 03/29/2018   Procedure: LEFT HEART CATH AND CORONARY ANGIOGRAPHY;  Surgeon: Yvonne Kendall, MD;  Location: [facility];  Service: Cardiovascular;  Laterality: N/A;   LUMBAR LAMINECTOMY/DECOMPRESSION MICRODISCECTOMY Left 01/03/2020   Procedure: Left L2-3 decompression disectomy, Insitu Fusion Lumbar 2-3;  Surgeon: Venita Lick, MD;  Location: [facility];  Service: Orthopedics;  Laterality: Left;   POLYPECTOMY     TONSILLECTOMY     TONSILLECTOMY     VAGINAL HYSTERECTOMY  1994   Family History  Problem Relation Age of Onset   Heart disease Mother    Heart disease Father        CABG   Heart disease Sister    Heart disease Brother        2 stents   Heart disease Maternal Grandmother    Heart disease Maternal Grandfather    Heart disease Paternal Grandmother    Heart disease Paternal Grandfather    Ulcerative colitis Daughter    Colon cancer Neg Hx    Colon polyps Neg Hx    Esophageal cancer Neg Hx    Rectal cancer Neg Hx    Stomach cancer Neg Hx    Social History   Socioeconomic History   Marital status: Married    Spouse name: Not on file   Number of children: 2   Years of education: Not on file   Highest education level: Not on file  Occupational History   Occupation: Retired Magazine features editor: RETIRED  Tobacco Use   Smoking status: Former    Current packs/day: 0.00    Types: Cigarettes    Quit date: 04/12/1977    Years since quitting: 46.1    Smokeless tobacco: Never   Tobacco comments:    quit age 8  Vaping Use   Vaping status: Never Used  Substance and Sexual Activity   Alcohol use: Not Currently    Alcohol/week: 0.0 standard drinks of alcohol    Comment: rarely   Drug use: No   Sexual activity: Not Currently  Other Topics Concern  Not on file  Social History Narrative   Not on file   Social Drivers of Health   Financial Resource Strain: Low Risk  (06/10/2023)   Overall Financial Resource Strain (CARDIA)    Difficulty of Paying Living Expenses: Not hard at all  Food Insecurity: No Food Insecurity (06/10/2023)   Hunger Vital Sign    Worried About Running Out of Food in the Last Year: Never true    Ran Out of Food in the Last Year: Never true  Transportation Needs: No Transportation Needs (06/10/2023)   PRAPARE - Administrator, Civil Service (Medical): No    Lack of Transportation (Non-Medical): No  Physical Activity: Insufficiently Active (06/10/2023)   Exercise Vital Sign    Days of Exercise per Week: 3 days    Minutes of Exercise per Session: 40 min  Stress: No Stress Concern Present (06/10/2023)   [employer] of Occupational Health - Occupational Stress Questionnaire    Feeling of Stress : Not at all  Social Connections: Socially Integrated (06/10/2023)   Social Connection and Isolation Panel [NHANES]    Frequency of Communication with Friends and Family: More than three times a week    Frequency of Social Gatherings with Friends and Family: More than three times a week    Attends Religious Services: More than 4 times per year    Active Member of [employer] or Organizations: Yes    Attends Engineer, structural: More than 4 times per year    Marital Status: Married    Tobacco Counseling Counseling given: Not Answered Tobacco comments: quit age 22   Clinical Intake:  Pre-visit preparation completed: Yes  Pain : No/denies pain     BMI - recorded: 29.95 Nutritional Status:  BMI 25 -29 Overweight Nutritional Risks: None Diabetes: No  How often do you need to have someone help you when you read instructions, pamphlets, or other written materials from your doctor or pharmacy?: 1 - Never  Interpreter Needed?: No  Information entered by :: Theresa Mulligan LPN   Activities of Daily Living    06/10/2023    8:15 AM  In your present state of health, do you have any difficulty performing the following activities:  Hearing? 0  Vision? 0  Difficulty concentrating or making decisions? 0  Walking or climbing stairs? 0  Dressing or bathing? 0  Doing errands, shopping? 0  Preparing Food and eating ? N  Using the Toilet? N  In the past six months, have you accidently leaked urine? N  Do you have problems with loss of bowel control? N  Managing your Medications? N  Managing your Finances? N  Housekeeping or managing your Housekeeping? N    Patient Care Team: Nelwyn Salisbury, MD as PCP - General Jens Som, Madolyn Frieze, MD as PCP - Cardiology (Cardiology) Venita Lick, MD as Consulting Physician (Orthopedic Surgery)  Indicate any recent Medical Services you may have received from other than [facility] providers in the past year (date may be approximate).     Assessment:   This is a routine wellness examination for [place].  Hearing/Vision screen Hearing Screening - Comments:: Denies hearing difficulties   Vision Screening - Comments:: Wears rx glasses - up to date with routine eye exams with  Dr Elmer Picker   Goals Addressed               This Visit's Progress     Increase physical activity (pt-stated)        Lose weight  Depression Screen    06/10/2023    8:14 AM 05/31/2022    8:28 AM 02/05/2022    9:18 AM 05/25/2021    8:17 AM 03/23/2021    2:41 PM 05/22/2020    2:58 PM 02/14/2020    9:04 AM  PHQ 2/9 Scores  PHQ - 2 Score 0 0 0 0 0 0 0  PHQ- 9 Score   0  0  0    Fall Risk    06/10/2023    8:15 AM 05/31/2022    8:29 AM 02/05/2022    9:18 AM  05/25/2021    8:17 AM 03/23/2021    2:41 PM  Fall Risk   Falls in the past year? 0 0 0 0 0  Number falls in past yr: 0 0 0  0  Injury with Fall? 0 0 0  0  Risk for fall due to : No Fall Risks No Fall Risks No Fall Risks Medication side effect No Fall Risks  Follow up Falls prevention discussed;Falls evaluation completed Falls prevention discussed Falls evaluation completed Falls evaluation completed;Education provided;Falls prevention discussed     MEDICARE RISK AT HOME: Medicare Risk at Home Any stairs in or around the home?: Yes If so, are there any without handrails?: No Home free of loose throw rugs in walkways, pet beds, electrical cords, etc?: Yes Adequate lighting in your home to reduce risk of falls?: Yes Life alert?: Yes (Apple Watch) Use of a cane, walker or w/c?: No Grab bars in the bathroom?: Yes Shower chair or bench in shower?: Yes Elevated toilet seat or a handicapped toilet?: Yes  TIMED UP AND GO:  Was the test performed?  No    Cognitive Function:        06/10/2023    8:16 AM 05/31/2022    8:30 AM 05/25/2021    8:18 AM  6CIT Screen  What Year? 0 points 0 points 0 points  What month? 0 points 0 points 0 points  What time? 0 points 0 points 0 points  Count back from 20 0 points 0 points 0 points  Months in reverse 0 points 0 points 0 points  Repeat phrase 0 points 0 points 0 points  Total Score 0 points 0 points 0 points    Immunizations Immunization History  Administered Date(s) Administered   Fluad Trivalent(High Dose 65+) 01/10/2023   Influenza Split 01/20/2011, 01/13/2012   Influenza Whole 01/19/2008, 01/21/2010   Influenza, High Dose Seasonal PF 01/05/2018, 02/04/2020   Influenza, Seasonal, Injecte, Preservative Fre 12/22/2018   Influenza-Unspecified 01/01/2014, 01/20/2015, 01/19/2016, 02/09/2022   PFIZER(Purple Top)SARS-COV-2 Vaccination 04/21/2019, 05/12/2019, 01/21/2020   Pfizer Fall 2023 Covid-19 Vaccine 41yrs thru 27yrs. 02/01/2022    Pfizer(Comirnaty)Fall Seasonal Vaccine 12 years and older 01/10/2023   Pneumococcal Conjugate-13 11/13/2015   Pneumococcal Polysaccharide-23 01/05/2018   RSV,unspecified 05/12/2022   Tdap 11/13/2015   Zoster Recombinant(Shingrix) 01/11/2019, 03/16/2019   Zoster, Live 08/16/2013    TDAP status: Up to date  Flu Vaccine status: Up to date  Pneumococcal vaccine status: Up to date  Covid-19 vaccine status: Declined, Education has been provided regarding the importance of this vaccine but patient still declined. Advised may receive this vaccine at local pharmacy or Health Dept.or vaccine clinic. Aware to provide a copy of the vaccination record if obtained from local pharmacy or Health Dept. Verbalized acceptance and understanding.  Qualifies for Shingles Vaccine? Yes   Zostavax completed Yes   Shingrix Completed?: Yes  Screening Tests Health Maintenance  Topic Date Due  Hepatitis C Screening  Never done   COVID-19 Vaccine (5 - 2024-25 season) 03/07/2023   Medicare Annual Wellness (AWV)  06/09/2024   DTaP/Tdap/Td (2 - Td or Tdap) 11/12/2025   Pneumonia Vaccine 35+ Years old  Completed   INFLUENZA VACCINE  Completed   DEXA SCAN  Completed   Zoster Vaccines- Shingrix  Completed   HPV VACCINES  Aged Out   Colonoscopy  Discontinued    Health Maintenance  Health Maintenance Due  Topic Date Due   Hepatitis C Screening  Never done   COVID-19 Vaccine (5 - 2024-25 season) 03/07/2023        Bone Density status: Completed 03/12/22. Results reflect: Bone density results: OSTEOPENIA. Repeat every   years.     Additional Screening:  Hepatitis C Screening: does qualify;  Deferred  Vision Screening: Recommended annual ophthalmology exams for early detection of glaucoma and other disorders of the eye. Is the patient up to date with their annual eye exam?  Yes  Who is the provider or what is the name of the office in which the patient attends annual eye exams? Dr Elmer Picker If pt is  not established with a provider, would they like to be referred to a provider to establish care? No .   Dental Screening: Recommended annual dental exams for proper oral hygiene    Community Resource Referral / Chronic Care Management:  CRR required this visit?  No   CCM required this visit?  No     Plan:     I have personally reviewed and noted the following in the patient's chart:   Medical and social history Use of alcohol, tobacco or illicit drugs  Current medications and supplements including opioid prescriptions. Patient is not currently taking opioid prescriptions. Functional ability and status Nutritional status Physical activity Advanced directives List of other physicians Hospitalizations, surgeries, and ER visits in previous 12 months Vitals Screenings to include cognitive, depression, and falls Referrals and appointments  In addition, I have reviewed and discussed with patient certain preventive protocols, quality metrics, and best practice recommendations. A written personalized care plan for preventive services as well as general preventive health recommendations were provided to patient.     Tillie Rung, LPN   [id number]   After Visit Summary: (MyChart) Due to this being a telephonic visit, the after visit summary with patients personalized plan was offered to patient via MyChart   Nurse Notes: None

## 2023-08-12 ENCOUNTER — Ambulatory Visit: Attending: Physician Assistant | Admitting: Physician Assistant

## 2023-08-12 ENCOUNTER — Encounter: Payer: Self-pay | Admitting: Physician Assistant

## 2023-08-12 VITALS — BP 114/70 | HR 64 | Ht 65.0 in | Wt 176.2 lb

## 2023-08-12 DIAGNOSIS — I480 Paroxysmal atrial fibrillation: Secondary | ICD-10-CM | POA: Diagnosis not present

## 2023-08-12 DIAGNOSIS — E785 Hyperlipidemia, unspecified: Secondary | ICD-10-CM | POA: Insufficient documentation

## 2023-08-12 DIAGNOSIS — I10 Essential (primary) hypertension: Secondary | ICD-10-CM | POA: Diagnosis not present

## 2023-08-12 DIAGNOSIS — I051 Rheumatic mitral insufficiency: Secondary | ICD-10-CM | POA: Insufficient documentation

## 2023-08-12 DIAGNOSIS — I2510 Atherosclerotic heart disease of native coronary artery without angina pectoris: Secondary | ICD-10-CM | POA: Diagnosis not present

## 2023-08-12 NOTE — Progress Notes (Signed)
 Cardiology Office Note:  .   Date:  08/12/2023  ID:  [name], DOB 03/21/1946, MRN [id number] PCP: Donley Furth, MD  [facility] Providers Cardiologist:  Alexandria Angel, MD    History of Present Illness: .   Anne Martin is a 78 y.o. female with PMH of CAD, HTN, HLD, aortic insufficiency/aortic stenosis and history of rheumatic fever. Cardiac catheterization performed in May 2011 revealed normal left main, 60% LAD, 90% D2, 50 to 60% left circumflex lesion, normal RCA, normal EF.  Patient underwent PCI of diagonal with DES. Carotid Doppler in October 2013 were normal. Cardiac catheterization in December 2019 showed nonobstructive disease with moderate mid LAD lesion, moderate in-stent restenosis in second diagonal, no disease in the left circumflex or RCA.  Ascending aorta was mildly dilated.  A CTA in August 2021 showed no evidence of thoracic aortic aneurysm.  Heart monitor in April 2022 showed sinus rhythm with occasional PACs, brief PAT and PVC.  Echocardiogram in November 2022 showed normal EF, mild LVH, moderate AI, mild to moderate aortic stenosis.  She was diagnosed with atrial fibrillation during ED visit on 09/23/2021 with the presenting complaint of palpitation, lightheadedness and shortness of breath.  EKG on arrival showed atrial fibrillation with RVR, heart rate of 161.  She spontaneously converted to sinus rhythm.  Metoprolol  was increased to 75 mg daily.  Eliquis  was added to her medical regimen.  Due to persistent intermittent chest discomfort, she underwent PET stress test on 12/01/2021 which came back as low risk study with normal perfusion no evidence of ischemia, EF 71%.  A heart monitor was ordered which showed frequent transient bursts of SVT, patient was asymptomatic, and underlying rhythm was mostly sinus rhythm with only 3% PVC burden.  Last echocardiogram obtained on 03/07/2023 showed EF of 60 to 65%, normal RV, mild to moderate MR, mild to moderate AI, mild aortic  stenosis.  Patient presents today for follow-up.  She denies any exertional chest pain or shortness of breath.  She has no lower extremity edema, orthopnea or PND.  Her heart rate is irregular on physical exam, EKG shows sinus rhythm with PACs.  She has been compliant with her medication.  Her lipid panel obtained in November showed a very well-controlled cholesterol.  She can follow-up in 6 months  ROS:   She denies chest pain, palpitations, dyspnea, pnd, orthopnea, n, v, dizziness, syncope, edema, weight gain, or early satiety. All other systems reviewed and are otherwise negative except as noted above.    Studies Reviewed: Aaron Aas   EKG Interpretation Date/Time:  Friday Aug 12 2023 08:14:54 EDT Ventricular Rate:  64 PR Interval:  180 QRS Duration:  82 QT Interval:  398 QTC Calculation: 410 R Axis:   43  Text Interpretation: Sinus rhythm with marked sinus arrhythmia When compared with ECG of 16-Mar-2023 08:20, No significant change was found Confirmed by Ervin Heath [phone]) on 08/12/2023 8:52:45 AM    Cardiac Studies & Procedures   ______________________________________________________________________________________________ CARDIAC CATHETERIZATION  CARDIAC CATHETERIZATION 03/29/2018  Conclusion Conclusions: 1. Non-obstructive coronary artery disease with moderate mid LAD disease (DFR 0.93) and moderate D2 in-stent restenosis (DFR 0.93).  No significant disease involving the LCx and RCA. 2. Normal left ventricular systolic function and left ventricular filling pressure. 3. Mild aortic stenosis and regurgitation. 4. Mildly dilated ascending aorta.  Recommendations: 1. Medical therapy and secondary prevention of coronary artery disease. 2. Consider cross-sectional imaging of aorta.  Sammy Crisp, MD [facility] Pager: [phone]  Findings Coronary Findings Diagnostic  Dominance: Right  Left Main Vessel is moderate in size.  Left Anterior Descending Vessel is moderate  in size. The vessel is moderately tortuous. Mid LAD lesion is 45% stenosed. Pressure wire/FFR was performed on the lesion. DFR 0.93.  First Diagonal Branch Vessel is small in size.  Second Diagonal Branch Vessel is moderate in size. Ost 2nd Diag to 2nd Diag lesion is 50% stenosed. The lesion was previously treated using a drug eluting stent over 2 years ago. Previously placed stent displays restenosis. Pressure wire/FFR was performed on the lesion. DFR 0.93.  Left Circumflex Vessel is moderate in size. Vessel is angiographically normal.  First Obtuse Marginal Branch Vessel is moderate in size.  Second Obtuse Marginal Branch Vessel is small in size.  Third Obtuse Marginal Branch Vessel is moderate in size.  Right Coronary Artery Vessel is large.  Right Posterior Descending Artery Vessel is small in size.  Right Posterior Atrioventricular Artery Vessel is large in size.  Intervention  No interventions have been documented.   STRESS TESTS  NM PET CT CARDIAC PERFUSION MULTI W/ABSOLUTE BLOODFLOW 12/01/2021  Narrative   The study is normal. The study is low risk.   LV perfusion is normal. There is no evidence of ischemia. There is no evidence of infarction.   Rest left ventricular function is normal. Rest EF: 71 %. Stress left ventricular function is normal. Stress EF: 75 %. End diastolic cavity size is normal. End systolic cavity size is normal. No evidence of transient ischemic dilation (TID) noted.   Myocardial blood flow was computed to be 0.71ml/g/min at rest and 2.12ml/g/min at stress. Global myocardial blood flow reserve was 3.07 and was normal.   Coronary calcium  assessment not performed due to prior revascularization.   Electronically signed by: Euell Herrlich, MD  CLINICAL DATA:  This over-read does not include interpretation of cardiac or coronary anatomy or pathology. The interpretation by the cardiologist is attached.  COMPARISON:  CT chest  11/29/2019.  FINDINGS: Vascular: None.  Mediastinum/Nodes: None.  Lungs/Pleura: Pulmonary nodules measure 2 mm or less in size, as on 11/29/2019 and benign.  Upper Abdomen: None.  Musculoskeletal: Degenerative changes in the spine.  IMPRESSION: No acute extracardiac findings.   Electronically Signed By: Shearon Denis M.D. On: 12/01/2021 10:13   ECHOCARDIOGRAM  ECHOCARDIOGRAM COMPLETE 03/07/2023  Narrative ECHOCARDIOGRAM REPORT    Patient Name:   FARM GREGORY  Date of Exam: 03/07/2023 Medical Rec #:  [id number]     Height:       66.0 in Accession #:    [id number]    Weight:       174.0 lb Date of Birth:  09-Oct-1945    BSA:          1.885 m Patient Age:    54 years      BP:           124/70 mmHg Patient Gender: F             HR:           59 bpm. Exam Location:  [address]  Procedure: 2D Echo, Cardiac Doppler and Color Doppler  Indications:    I35.0 AS  History:        Patient has prior history of Echocardiogram examinations, most recent 03/08/2022. CAD, AS, Arrythmias:Atrial Fibrillation; Risk Factors:Dyslipidemia.  Sonographer:    Lula Sale RDCS Referring Phys: [id number] BRIAN S CRENSHAW  IMPRESSIONS   1. Left ventricular ejection fraction, by estimation,  is 60 to 65%. The left ventricle has normal function. The left ventricle has no regional wall motion abnormalities. Left ventricular diastolic parameters were normal. 2. Right ventricular systolic function is normal. The right ventricular size is normal. 3. Left atrial size was mildly dilated. 4. The mitral valve is abnormal. Mild to moderate mitral valve regurgitation. No evidence of mitral stenosis. 5. Gradients stable compared to echo 03/08/22. The aortic valve is tricuspid. There is moderate calcification of the aortic valve. Aortic valve regurgitation is mild to moderate. Mild aortic valve stenosis. 6. The inferior vena cava is normal in size with greater than 50% respiratory variability,  suggesting right atrial pressure of 3 mmHg.  FINDINGS Left Ventricle: Left ventricular ejection fraction, by estimation, is 60 to 65%. The left ventricle has normal function. The left ventricle has no regional wall motion abnormalities. The left ventricular internal cavity size was normal in size. There is no left ventricular hypertrophy. Left ventricular diastolic parameters were normal.  Right Ventricle: The right ventricular size is normal. No increase in right ventricular wall thickness. Right ventricular systolic function is normal.  Left Atrium: Left atrial size was mildly dilated.  Right Atrium: Right atrial size was normal in size.  Pericardium: There is no evidence of pericardial effusion.  Mitral Valve: The mitral valve is abnormal. There is moderate thickening of the mitral valve leaflet(s). Mild to moderate mitral valve regurgitation. No evidence of mitral valve stenosis.  Tricuspid Valve: The tricuspid valve is normal in structure. Tricuspid valve regurgitation is not demonstrated. No evidence of tricuspid stenosis.  Aortic Valve: Gradients stable compared to echo 03/08/22. The aortic valve is tricuspid. There is moderate calcification of the aortic valve. Aortic valve regurgitation is mild to moderate. Aortic regurgitation PHT measures 534 msec. Mild aortic stenosis is present. Aortic valve mean gradient measures 14.8 mmHg. Aortic valve peak gradient measures 27.7 mmHg. Aortic valve area, by VTI measures 0.91 cm.  Pulmonic Valve: The pulmonic valve was normal in structure. Pulmonic valve regurgitation is not visualized. No evidence of pulmonic stenosis.  Aorta: The aortic root is normal in size and structure.  Venous: The inferior vena cava is normal in size with greater than 50% respiratory variability, suggesting right atrial pressure of 3 mmHg.  IAS/Shunts: No atrial level shunt detected by color flow Doppler.   LEFT VENTRICLE PLAX 2D LVIDd:         4.00 cm LVIDs:          2.30 cm LV PW:         1.10 cm LV IVS:        1.10 cm LVOT diam:     1.70 cm LV SV:         59 LV SV Index:   31 LVOT Area:     2.27 cm   RIGHT VENTRICLE             IVC RV S prime:     17.40 cm/s  IVC diam: 1.00 cm TAPSE (M-mode): 2.6 cm RVSP:           31.3 mmHg  LEFT ATRIUM           Index        RIGHT ATRIUM           Index LA diam:      3.80 cm 2.02 cm/m   RA Pressure: 3.00 mmHg LA Vol (A2C): 43.4 ml 23.03 ml/m  RA Area:     12.90 cm LA Vol (A4C): 53.3 ml  28.28 ml/m  RA Volume:   32.40 ml  17.19 ml/m AORTIC VALVE AV Area (Vmax):    0.90 cm AV Area (Vmean):   0.88 cm AV Area (VTI):     0.91 cm AV Vmax:           263.20 cm/s AV Vmean:          178.800 cm/s AV VTI:            0.647 m AV Peak Grad:      27.7 mmHg AV Mean Grad:      14.8 mmHg LVOT Vmax:         104.00 cm/s LVOT Vmean:        69.400 cm/s LVOT VTI:          0.260 m LVOT/AV VTI ratio: 0.40 AI PHT:            534 msec  AORTA Ao Root diam: 2.70 cm Ao Asc diam:  3.20 cm  MITRAL VALVE                TRICUSPID VALVE MV Area (PHT): 3.28 cm     TR Peak grad:   28.3 mmHg MV Decel Time: 231 msec     TR Vmax:        266.00 cm/s MV E velocity: 113.20 cm/s  Estimated RAP:  3.00 mmHg MV A velocity: 137.20 cm/s  RVSP:           31.3 mmHg MV E/A ratio:  0.83 SHUNTS Systemic VTI:  0.26 m Systemic Diam: 1.70 cm  Janelle Mediate MD Electronically signed by Janelle Mediate MD Signature Date/Time: 03/07/2023/10:04:43 AM    Final    MONITORS  LONG TERM MONITOR (3-14 DAYS) 10/22/2021  Narrative Patch Wear Time:  14 days and 0 hours (2023-06-21T09:38:56-0400 to 2023-07-05T09:39:00-0400)  Patient had a min HR of 46 bpm, max HR of 190 bpm, and avg HR of 64 bpm. Predominant underlying rhythm was Sinus Rhythm. 271 Supraventricular Tachycardia runs occurred, the run with the fastest interval lasting 5 beats with a max rate of 190 bpm, the longest lasting 15.4 secs with an avg rate of 113 bpm. Some episodes  of Supraventricular Tachycardia may be possible Atrial Tachycardia with variable block. Isolated SVEs were occasional (3.8%, 48212), SVE Couplets were rare (<1.0%, 5491), and SVE Triplets were rare (<1.0%, 1755). Isolated VEs were rare (<1.0%), VE Couplets were rare (<1.0%), and no VE Triplets were present. Ventricular Bigeminy and Trigeminy were present.  Sinus bradycardia, normal sinus rhythm, sinus tachycardia, PACs, brief runs of PAT, occasional PVC and couplet. Alexandria Angel, MD       ______________________________________________________________________________________________      Risk Assessment/Calculations:    CHA2DS2-VASc Score = 5   This indicates a 7.2% annual risk of stroke. The patient's score is based upon: CHF History: 0 HTN History: 1 Diabetes History: 0 Stroke History: 0 Vascular Disease History: 1 Age Score: 2 Gender Score: 1           Physical Exam:   VS:  BP 114/70   Pulse 64   Ht 5\' 5"  (1.651 m)   Wt 176 lb 3.2 oz (79.9 kg)   LMP  (LMP Unknown)   SpO2 96%   BMI 29.32 kg/m    Wt Readings from Last 3 Encounters:  08/12/23 176 lb 3.2 oz (79.9 kg)  06/10/23 180 lb (81.6 kg)  03/28/23 180 lb (81.6 kg)    GEN: Well nourished, well developed in no acute distress NECK:  No JVD; No carotid bruits CARDIAC: Irregular, no murmurs, rubs, gallops RESPIRATORY:  Clear to auscultation without rales, wheezing or rhonchi  ABDOMEN: Soft, non-tender, non-distended EXTREMITIES:  No edema; No deformity   ASSESSMENT AND PLAN: .    CAD: Denies any exertional symptoms.  Continue Imdur  and metoprolol  succinate.  Aspirin  and Lipitor   PAF: On Eliquis .  Currently maintaining sinus rhythm  Hypertension: Blood pressure very well-controlled.  Hyperlipidemia: Cholesterol is very well-controlled on current therapy.  Mitral and aortic valve regurgitation: History of rheumatic fever.  Mild to moderate valve leakage recent echocardiogram in November 2024.   Continue  monitoring.  No sign of heart failure.       Dispo: Follow-up in 6 months  Signed, Adreonna Yontz, [place]

## 2023-08-12 NOTE — Patient Instructions (Signed)
 Medication Instructions:  NO CHANGES *If you need a refill on your cardiac medications before your next appointment, please call your pharmacy*  Lab Work: NO LABS If you have labs (blood work) drawn today and your tests are completely normal, you will receive your results only by: MyChart Message (if you have MyChart) OR A paper copy in the mail If you have any lab test that is abnormal or we need to change your treatment, we will call you to review the results.  Testing/Procedures: NO TESTING  Follow-Up: At [facility], you and your health needs are our priority.  As part of our continuing mission to provide you with exceptional heart care, our providers are all part of one team.  This team includes your primary Cardiologist (physician) and Advanced Practice Providers or APPs (Physician Assistants and Nurse Practitioners) who all work together to provide you with the care you need, when you need it.  Your next appointment:   6 month(s)  Provider:   Alexandria Angel, MD

## 2023-09-16 ENCOUNTER — Other Ambulatory Visit: Payer: Self-pay | Admitting: Cardiology

## 2023-09-16 DIAGNOSIS — I480 Paroxysmal atrial fibrillation: Secondary | ICD-10-CM

## 2023-09-16 NOTE — Telephone Encounter (Signed)
 Prescription refill request for Eliquis  received. Indication:afib Last office visit:5/25 Scr:0.83  12/24 Age: 78 Weight:79.9  kg  Prescription refilled

## 2023-09-29 ENCOUNTER — Other Ambulatory Visit: Payer: Self-pay | Admitting: Cardiology

## 2023-11-27 ENCOUNTER — Other Ambulatory Visit: Payer: Self-pay | Admitting: Cardiology

## 2023-12-08 ENCOUNTER — Ambulatory Visit (INDEPENDENT_AMBULATORY_CARE_PROVIDER_SITE_OTHER): Admitting: Family Medicine

## 2023-12-08 ENCOUNTER — Emergency Department (HOSPITAL_BASED_OUTPATIENT_CLINIC_OR_DEPARTMENT_OTHER)

## 2023-12-08 ENCOUNTER — Emergency Department (HOSPITAL_BASED_OUTPATIENT_CLINIC_OR_DEPARTMENT_OTHER)
Admission: EM | Admit: 2023-12-08 | Discharge: 2023-12-08 | Disposition: A | Discharge status: Home / Self Care | Attending: Emergency Medicine | Admitting: Emergency Medicine

## 2023-12-08 ENCOUNTER — Ambulatory Visit: Payer: Self-pay

## 2023-12-08 ENCOUNTER — Other Ambulatory Visit: Payer: Self-pay

## 2023-12-08 ENCOUNTER — Encounter: Payer: Self-pay | Admitting: Family Medicine

## 2023-12-08 VITALS — BP 98/66 | HR 70 | Temp 97.9°F | Resp 18 | Ht 65.0 in | Wt 175.2 lb

## 2023-12-08 DIAGNOSIS — R519 Headache, unspecified: Secondary | ICD-10-CM | POA: Insufficient documentation

## 2023-12-08 DIAGNOSIS — J3489 Other specified disorders of nose and nasal sinuses: Secondary | ICD-10-CM

## 2023-12-08 DIAGNOSIS — Z7901 Long term (current) use of anticoagulants: Secondary | ICD-10-CM | POA: Diagnosis not present

## 2023-12-08 DIAGNOSIS — I2510 Atherosclerotic heart disease of native coronary artery without angina pectoris: Secondary | ICD-10-CM | POA: Insufficient documentation

## 2023-12-08 LAB — RESP PANEL BY RT-PCR (RSV, FLU A&B, COVID)  RVPGX2
Influenza A by PCR: NEGATIVE
Influenza B by PCR: NEGATIVE
Resp Syncytial Virus by PCR: NEGATIVE
SARS Coronavirus 2 by RT PCR: NEGATIVE

## 2023-12-08 LAB — POC COVID19 BINAXNOW: SARS Coronavirus 2 Ag: NEGATIVE

## 2023-12-08 MED ORDER — DEXAMETHASONE 4 MG PO TABS
10.0000 mg | ORAL_TABLET | Freq: Once | ORAL | Status: AC
  Administered 2023-12-08: 10 mg via ORAL
  Filled 2023-12-08: qty 3

## 2023-12-08 MED ORDER — AMOXICILLIN-POT CLAVULANATE 875-125 MG PO TABS: 1.0000 | ORAL_TABLET | Freq: Two times a day (BID) | ORAL | 0 refills | Status: DC

## 2023-12-08 NOTE — Patient Instructions (Signed)
 COVID-19 test was negative today.  As we discussed there are many possible causes of headache but I am concerned about your current headache as that has not improved with home treatments, and I am unable to reproduce that headache with exam of the frontal or maxillary sinuses of the face, less likely sinus infection.  Given that this has been your worst headache and not improving, I do recommend evaluation through the emergency room to determine if other testing is indicated. They may also provide some medications through the ER to treat your headache.   Please proceed to the emergency room after leaving the office today.  I will keep an eye out for what they find and if there are any follow-up recommendations, but those recommendations may also go to your primary care provider.  Thank you for coming in today.  Hope you feel better soon.

## 2023-12-08 NOTE — ED Provider Notes (Signed)
 [place] EMERGENCY DEPARTMENT AT [facility] Provider Note   CSN: [id number] Arrival date & time: 12/08/23  1226     Patient presents with: Headache   Anne Martin is a 78 y.o. female.   Patient sent here by primary care doctor for head CT.  She has been having headache for the last 5 days.  She states that she has been having nasal congestion and sinus infection type feelings.  She feels like she is having allergies.  Over-the-counter medications have not helped.  She is on blood thinner.  She denies any weakness numbness tingling.  No vision loss no speech changes.  Denies any sore throat cough sputum production.  History of CAD high cholesterol clotting disorder.  The history is provided by the patient.       Prior to Admission medications   Medication Sig Start Date End Date Taking? Authorizing Provider  amoxicillin -clavulanate (AUGMENTIN ) 875-125 MG tablet Take 1 tablet by mouth every 12 (twelve) hours. 12/08/23  Yes Antanette Richwine, DO  alendronate (FOSAMAX) 70 MG tablet Take 70 mg by mouth once a week. 03/23/22   [provider]  atorvastatin  (LIPITOR ) 80 MG tablet TAKE 1 TABLET BY MOUTH EVERY DAY AT 6PM 09/29/23   Pietro Redell RAMAN, MD  calcium  carbonate (OSCAL) 1500 (600 Ca) MG TABS tablet Take 600 mg of elemental calcium  by mouth daily.    [provider]  cholecalciferol (VITAMIN D ) 1000 UNITS tablet Take 1,000 Units by mouth every evening.     [provider]  ELIQUIS  5 MG TABS tablet TAKE 1 TABLET BY MOUTH TWICE A DAY 09/16/23   Pietro Redell RAMAN, MD  isosorbide  mononitrate (IMDUR ) 30 MG 24 hr tablet TAKE 1 TABLET BY MOUTH EVERY DAY 03/29/23   Pietro Redell RAMAN, MD  loratadine  (CLARITIN ) 10 MG tablet Take 10 mg by mouth daily.     [provider]  metoprolol  succinate (TOPROL -XL) 50 MG 24 hr tablet TAKE 1 AND 1/2 TABLETS DAILY (75 MG TOTAL) BY MOUTH WITH FOOD OR IMMEDIATELY AFTER MEALS 11/29/23   Pietro Redell RAMAN, MD   nitroGLYCERIN  (NITROSTAT ) 0.4 MG SL tablet Place 1 tablet (0.4 mg total) under the tongue every 5 (five) minutes as needed. MAX 3 doses 02/03/22   Pietro Redell RAMAN, MD  pantoprazole (PROTONIX) 40 MG tablet Take 40 mg by mouth daily. Patient not taking: Reported on 12/08/2023 07/26/23   [provider]  Probiotic Product (PROBIOTIC PO) Take 1 tablet by mouth daily.     [provider]  vitamin C (ASCORBIC ACID) 500 MG tablet Take 500 mg by mouth at bedtime.    [provider]  Zinc 50 MG TABS Take 50 mg by mouth.    [provider]    Allergies: Procaine and Procaine hcl    Review of Systems  Updated Vital Signs BP (!) 140/77 (BP Location: Right Arm)   Pulse 62   Temp 97.6 F (36.4 C) (Oral)   Resp 18   Ht 5' 5 (1.651 m)   Wt 79.5 kg   LMP  (LMP Unknown)   BMI 29.17 kg/m   Physical Exam Vitals and nursing note reviewed.  Constitutional:      General: She is not in acute distress.    Appearance: She is well-developed. She is not ill-appearing.  HENT:     Head: Normocephalic and atraumatic.     Mouth/Throat:     Mouth: Mucous membranes are moist.     Pharynx: Oropharynx  is clear.  Eyes:     Extraocular Movements: Extraocular movements intact.     Right eye: Normal extraocular motion and no nystagmus.     Left eye: Normal extraocular motion and no nystagmus.     Conjunctiva/sclera: Conjunctivae normal.     Pupils: Pupils are equal, round, and reactive to light.  Cardiovascular:     Rate and Rhythm: Normal rate and regular rhythm.     Heart sounds: No murmur heard. Pulmonary:     Effort: Pulmonary effort is normal. No respiratory distress.     Breath sounds: Normal breath sounds.  Abdominal:     Palpations: Abdomen is soft.     Tenderness: There is no abdominal tenderness.  Musculoskeletal:        General: No swelling.     Cervical back: Normal range of motion and neck supple.  Skin:    General: Skin is warm and dry.     Capillary  Refill: Capillary refill takes less than 2 seconds.  Neurological:     Mental Status: She is alert and oriented to person, place, and time.     Comments: 5+ out of 5 strength throughout, normal sensation, normal visual fields normal speech normal sensation normal cranial nerves  Psychiatric:        Mood and Affect: Mood normal.     (all labs ordered are listed, but only abnormal results are displayed) Labs Reviewed  RESP PANEL BY RT-PCR (RSV, FLU A&B, COVID)  [id number]    EKG: None  Radiology: CT Head Wo Contrast Result Date: 12/08/2023 CLINICAL DATA:  Polytrauma, blunt EXAM: CT HEAD WITHOUT CONTRAST TECHNIQUE: Contiguous axial images were obtained from the base of the skull through the vertex without intravenous contrast. RADIATION DOSE REDUCTION: This exam was performed according to the departmental dose-optimization program which includes automated exposure control, adjustment of the mA and/or kV according to patient size and/or use of iterative reconstruction technique. COMPARISON:  None Available. FINDINGS: Brain: No acute intracranial abnormality. Specifically, no hemorrhage, hydrocephalus, mass lesion, acute infarction, or significant intracranial injury. Vascular: No hyperdense vessel or unexpected calcification. Skull: No acute calvarial abnormality. Sinuses/Orbits: No acute findings Other: None IMPRESSION: No acute intracranial abnormality. Electronically Signed   By: Franky Crease M.D.   On: 12/08/2023 13:40     Procedures   Medications Ordered in the ED  dexamethasone  (DECADRON ) tablet 10 mg (has no administration in time range)                                    Medical Decision Making Amount and/or Complexity of Data Reviewed Radiology: ordered.  Risk Prescription drug management.   Anne Martin is here with headache.  Normal vitals.  No fever.  Differential diagnosis likely sinusitis/viral process/allergy type process but will get a head CT given that headaches been  for 5 days and then little bit atypical for her.  She feels comfortable now.  She does not describe this as her worst headache of her life.  She thinks that she has a sinus infection.  Will get a CT head recheck a COVID test as she had a rapid test but would like to confirm with the PCR test.  She declined any pain medicine at this time.  She is neurologically intact.  No chest pain or shortness of breath.  Well-appearing.  Overall head CT is unremarkable.  COVID flu RSV test negative.  I do  think this is likely sinus headache may be migraine headache.  Will give her a dose of Decadron  and treat with Augmentin .  Continue her allergy medication at home.  Discharged in good condition.  Understands return precautions.  This chart was dictated using voice recognition software.  Despite best efforts to proofread,  errors can occur which can change the documentation meaning.      Final diagnoses:  Sinus headache    ED Discharge Orders          Ordered    amoxicillin -clavulanate (AUGMENTIN ) 875-125 MG tablet  Every 12 hours        12/08/23 1401               Lewis, Juliene, DO 12/08/23 1401

## 2023-12-08 NOTE — Progress Notes (Addendum)
 Subjective:  Patient ID: Anne Martin, female    DOB: 1946-03-03  Age: 78 y.o. MRN: [id number]  CC:  Chief Complaint  Patient presents with   Acute Visit    Headache for the last 5 days. Has an allergy to ragweed. Thinks it may be allergies. No other sx    HPI Anne Martin presents for  Acute visit for above.  PCP is Dr. Johnny.  Headache Past 5 days - frontal HA, Pounding headache.   some runny nose - with allergies - clear drainage with ragweed. No discolored nasal d/c and no pressure in face sinuses. More runny nose past week, but chronic allergies.  Does not usually get headaches. No hx of migraine. No photophobia, no phonophobia, no neck stiffness.  No vision changes, jaw pain or temporal HA.  Worst HA she has experienced - not going away.  No n/v.  No focal weakness, or speech difficulty.  No persistent relief with advil  - improves for an hour, then returns.  No other treatments for HA. On claritin  daily for allergies.  No known sick contacts. No recent travel. No body aches, no joint aches.   Hx of afib, on eliquis , no missed doses. No hx of TIA/CVA.  No fall/trauma/injury.   History Patient Active Problem List   Diagnosis Date Noted   Osteopenia 03/24/2022   PAF (paroxysmal atrial fibrillation) (HCC) 03/24/2022   Lumbar herniated disc 01/03/2020   Lumbar disc herniation 01/03/2020   Unstable angina (HCC) 03/28/2018   Unspecified internal derangement of right knee 08/04/2017   Chest pain 10/28/2016   Aortic insufficiency 02/29/2016   Essential hypertension 02/29/2016   Musculoskeletal chest pain 02/28/2016   Aortic stenosis 02/24/2012   Hyperlipidemia 11/07/2009   CAD (coronary artery disease) 09/15/2009   CAROTID BRUIT 09/15/2009   Disorder of bone and cartilage 12/17/2008   Allergic rhinitis 08/16/2007   History of cardiovascular disorder 08/16/2007   Past Medical History:  Diagnosis Date   Allergy    seasonal   Anemia    prior to hysterectomy, fibroids    Aortic insufficiency    a. 10/2015 Echo: mild to moderate AI.   Aortic stenosis    a. 10/2015 Echo: moderate AS.   Blood transfusion without reported diagnosis    with hysterectomy   CAD (coronary artery disease)    a. 08/2009 s/p cath after abnormal nuclear study; LM nl, LAD 60%, D2 90% (PCI/DES), LCX 50 -60%, RCA nl, nl EF;  b. 2012 MV: no ischemia/infarct;  c. 08/2015 MV: no ischemia/infarct, EF 70%.   Clotting disorder (HCC)    Complication of anesthesia    Diastolic dysfunction    a. 10/2015 Echo: EF 55-60%, no rwma, Gr1 DD, mod AS, mild to mod AI.   Essential hypertension    preventive meds due to heart   H/O: rheumatic fever    Heart murmur    History of colonoscopy    Hyperlipidemia    preventative med for heart   Osteopenia    last DEXA 11-22-13    Osteoporosis    PONV (postoperative nausea and vomiting)    Past Surgical History:  Procedure Laterality Date   ANGIOPLASTY  08/2009   stents   AORTIC ARCH ANGIOGRAPHY N/A 03/29/2018   Procedure: AORTIC ARCH ANGIOGRAPHY;  Surgeon: Mady Bruckner, MD;  [facility];  Service: Cardiovascular;  Laterality: N/A;   BACK SURGERY     11/2020   CHOLECYSTECTOMY  02/2003   COLONOSCOPY  08/06/2021  per Dr. Abran, clear, no repeats needed   CORONARY PRESSURE/FFR STUDY N/A 03/29/2018   Procedure: INTRAVASCULAR PRESSURE WIRE/FFR STUDY;  Surgeon: Mady Bruckner, MD;  Location: [facility];  Service: Cardiovascular;  Laterality: N/A;   KNEE ARTHROSCOPY     Rt. Knee   LEFT HEART CATH AND CORONARY ANGIOGRAPHY N/A 03/29/2018   Procedure: LEFT HEART CATH AND CORONARY ANGIOGRAPHY;  Surgeon: Mady Bruckner, MD;  Location: [facility];  Service: Cardiovascular;  Laterality: N/A;   LUMBAR LAMINECTOMY/DECOMPRESSION MICRODISCECTOMY Left 01/03/2020   Procedure: Left L2-3 decompression disectomy, Insitu Fusion Lumbar 2-3;  Surgeon: Burnetta Aures, MD;  Location: [facility];  Service: Orthopedics;  Laterality: Left;    POLYPECTOMY     TONSILLECTOMY     TONSILLECTOMY     TUBAL LIGATION     VAGINAL HYSTERECTOMY  1994   Allergies  Allergen Reactions   Procaine Rash    Novocain reaction as child   Procaine Hcl Other (See Comments)    Unknown   Prior to Admission medications   Medication Sig Start Date End Date Taking? Authorizing Provider  alendronate (FOSAMAX) 70 MG tablet Take 70 mg by mouth once a week. 03/23/22  Yes [provider]  atorvastatin  (LIPITOR ) 80 MG tablet TAKE 1 TABLET BY MOUTH EVERY DAY AT 6PM 09/29/23  Yes Crenshaw, Redell RAMAN, MD  calcium  carbonate (OSCAL) 1500 (600 Ca) MG TABS tablet Take 600 mg of elemental calcium  by mouth daily.   Yes [provider]  cholecalciferol (VITAMIN D ) 1000 UNITS tablet Take 1,000 Units by mouth every evening.    Yes [provider]  ELIQUIS  5 MG TABS tablet TAKE 1 TABLET BY MOUTH TWICE A DAY 09/16/23  Yes Pietro Redell RAMAN, MD  isosorbide  mononitrate (IMDUR ) 30 MG 24 hr tablet TAKE 1 TABLET BY MOUTH EVERY DAY 03/29/23  Yes Pietro Redell RAMAN, MD  loratadine  (CLARITIN ) 10 MG tablet Take 10 mg by mouth daily.    Yes [provider]  metoprolol  succinate (TOPROL -XL) 50 MG 24 hr tablet TAKE 1 AND 1/2 TABLETS DAILY (75 MG TOTAL) BY MOUTH WITH FOOD OR IMMEDIATELY AFTER MEALS 11/29/23  Yes Crenshaw, Redell RAMAN, MD  nitroGLYCERIN  (NITROSTAT ) 0.4 MG SL tablet Place 1 tablet (0.4 mg total) under the tongue every 5 (five) minutes as needed. MAX 3 doses 02/03/22  Yes Crenshaw, Redell RAMAN, MD  Probiotic Product (PROBIOTIC PO) Take 1 tablet by mouth daily.    Yes [provider]  vitamin C (ASCORBIC ACID) 500 MG tablet Take 500 mg by mouth at bedtime.   Yes [provider]  Zinc 50 MG TABS Take 50 mg by mouth.   Yes [provider]  pantoprazole (PROTONIX) 40 MG tablet Take 40 mg by mouth daily. Patient not taking: Reported on 12/08/2023 07/26/23   [provider]   Social History   Socioeconomic History    Marital status: Married    Spouse name: Not on file   Number of children: 2   Years of education: Not on file   Highest education level: Not on file  Occupational History   Occupation: Retired Magazine features editor: RETIRED  Tobacco Use   Smoking status: Former    Current packs/day: 0.00    Types: Cigarettes    Quit date: 04/12/1977    Years since quitting: 46.6   Smokeless tobacco: Never   Tobacco comments:    quit age 25  Vaping Use   Vaping status: Never Used  Substance  and Sexual Activity   Alcohol use: Not Currently    Alcohol/week: 0.0 standard drinks of alcohol    Comment: rarely   Drug use: No   Sexual activity: Not Currently  Other Topics Concern   Not on file  Social History Narrative   Not on file   Social Drivers of Health   Financial Resource Strain: Low Risk  (06/10/2023)   Overall Financial Resource Strain (CARDIA)    Difficulty of Paying Living Expenses: Not hard at all  Food Insecurity: No Food Insecurity (06/10/2023)   Hunger Vital Sign    Worried About Running Out of Food in the Last Year: Never true    Ran Out of Food in the Last Year: Never true  Transportation Needs: No Transportation Needs (06/10/2023)   PRAPARE - Administrator, Civil Service (Medical): No    Lack of Transportation (Non-Medical): No  Physical Activity: Insufficiently Active (06/10/2023)   Exercise Vital Sign    Days of Exercise per Week: 3 days    Minutes of Exercise per Session: 40 min  Stress: No Stress Concern Present (06/10/2023)   [employer] of Occupational Health - Occupational Stress Questionnaire    Feeling of Stress : Not at all  Social Connections: Socially Integrated (06/10/2023)   Social Connection and Isolation Panel    Frequency of Communication with Friends and Family: More than three times a week    Frequency of Social Gatherings with Friends and Family: More than three times a week    Attends Religious Services: More than 4 times per year     Active Member of [employer] or Organizations: Yes    Attends Banker Meetings: More than 4 times per year    Marital Status: Married  Catering manager Violence: Not At Risk (06/10/2023)   Humiliation, Afraid, Rape, and Kick questionnaire    Fear of Current or Ex-Partner: No    Emotionally Abused: No    Physically Abused: No    Sexually Abused: No    Review of Systems   Objective:   Vitals:   12/08/23 1116  BP: 98/66  Pulse: 70  Resp: 18  Temp: 97.9 F (36.6 C)  TempSrc: Temporal  SpO2: 96%  Weight: 175 lb 3.2 oz (79.5 kg)  Height: 5' 5 (1.651 m)     Physical Exam Vitals reviewed.  Constitutional:      General: She is not in acute distress.    Appearance: She is well-developed. She is not ill-appearing, toxic-appearing or diaphoretic.  HENT:     Head: Normocephalic and atraumatic.     Comments: Temporal scalp nontender.    Right Ear: Hearing, tympanic membrane, ear canal and external ear normal.     Left Ear: Hearing, tympanic membrane, ear canal and external ear normal.     Nose: Nose normal.     Comments: Frontal sinuses, maxillary sinuses nontender with percussion and does not reproduce headache.    Mouth/Throat:     Pharynx: No oropharyngeal exudate.  Eyes:     Extraocular Movements: Extraocular movements intact.     Conjunctiva/sclera: Conjunctivae normal.     Pupils: Pupils are equal, round, and reactive to light.     Comments: No nystagmus  Cardiovascular:     Rate and Rhythm: Normal rate. Rhythm irregular.     Heart sounds: Murmur (2-3/6 SEM) heard.  Pulmonary:     Effort: Pulmonary effort is normal. No respiratory distress.     Breath sounds: Normal breath sounds. No  wheezing or rhonchi.  Musculoskeletal:     Cervical back: Normal range of motion and neck supple.  Skin:    General: Skin is warm and dry.     Findings: No rash.  Neurological:     General: No focal deficit present.     Mental Status: She is alert and oriented to person,  place, and time.     Comments: Nonfocal exam with equal upper extremity, lower extremity strength.  Psychiatric:        Mood and Affect: Mood normal.        Behavior: Behavior normal.    Results for orders placed or performed in visit on 12/08/23  POC COVID-19   Collection Time: 12/08/23 11:53 AM  Result Value Ref Range   SARS Coronavirus 2 Ag Negative Negative     Assessment & Plan:  Anne Martin is a 78 y.o. female . Frontal headache - Plan: POC COVID-19  Rhinorrhea - Plan: POC COVID-19 5-day history of unremitting frontal headache.  Only minimal temporary improvement with ibuprofen , then recurs.  She does not have a history of headache disorder, has not had headaches like this in the past and attributes this to being the worst headache of her life.  She has a nonfocal neuroexam.  Unable to reproduce headache with percussion of frontal and maxillary sinuses, less likely sinusitis.  Differential includes headache from her allergies, but based on age, symptom description as above and worst headache, I feel like ER evaluation would be most appropriate at this time to decide if neuroimaging indicated.  They may also be able to get her medication through the ER to help with current symptoms.  Plan discussed, all questions answered.  No orders of the defined types were placed in this encounter.  Patient Instructions  COVID-19 test was negative today.  As we discussed there are many possible causes of headache but I am concerned about your current headache as that has not improved with home treatments, and I am unable to reproduce that headache with exam of the frontal or maxillary sinuses of the face, less likely sinus infection.  Given that this has been your worst headache and not improving, I do recommend evaluation through the emergency room to determine if other testing is indicated. They may also provide some medications through the ER to treat your headache.   Please proceed to the  emergency room after leaving the office today.  I will keep an eye out for what they find and if there are any follow-up recommendations, but those recommendations may also go to your primary care provider.  Thank you for coming in today.  Hope you feel better soon.      Signed,   Reyes Pines, MD [facility] 12/08/23 11:57 AM

## 2023-12-08 NOTE — ED Notes (Signed)
 Reviewed AVS/discharge instruction with patient. Time allotted for and all questions answered. Patient is agreeable for d/c and escorted to ed exit by staff.

## 2023-12-08 NOTE — Telephone Encounter (Signed)
 FYI Only or Action Required?: FYI only for provider.  Patient was last seen in primary care on 03/28/2023 by Anne Martin LABOR, MD.  Called Nurse Triage reporting Headache.  Symptoms began several days ago.  Interventions attempted: Rest, hydration, or home remedies.  Symptoms are: gradually worsening.  Triage Disposition: See HCP Within 4 Hours (Or PCP Triage)  Patient/caregiver understands and will follow disposition?: Yes, will follow disposition  Copied from CRM #[id number]. Topic: Clinical - Red Word Triage >> Dec 08, 2023  8:03 AM Suzen RAMAN wrote: Red Word that prompted transfer to Nurse Triage: severe headache x5 days.. possible sinus infection not improved with medications Reason for Disposition  [1] SEVERE headache (e.g., excruciating) AND [2] not improved after 2 hours of pain medicine  Answer Assessment - Initial Assessment Questions 1. LOCATION: Where does it hurt?      Head, between eyes 2. ONSET: When did the headache start? (e.g., minutes, hours, days)      5 days ago 3. PATTERN: Does the pain come and go, or has it been constant since it started?     constant 4. SEVERITY: How bad is the pain? and What does it keep you from doing?  (e.g., Scale 1-10; mild, moderate, or severe)     7 5. RECURRENT SYMPTOM: Have you ever had headaches before? If Yes, ask: When was the last time? and What happened that time?      denies 6. CAUSE: What do you think is causing the headache?     Allergies or sinus HA 7. MIGRAINE: Have you been diagnosed with migraine headaches? If Yes, ask: Is this headache similar?      denies 8. HEAD INJURY: Has there been any recent injury to your head?      denies 9. OTHER SYMPTOMS: Do you have any other symptoms? (e.g., fever, stiff neck, eye pain, sore throat, cold symptoms)     Runny nose at times  Denies vision changes, denies speech changes, denies CMS changes. No appts available at PCP clinic, scheduled at another in  the region.  Protocols used: [facility]

## 2023-12-08 NOTE — ED Triage Notes (Signed)
 Pt via pov from pcp with headache x 5 days. She went to pcp for help with it, as it was not resolving with home meds and was sent to ED for further evaluation and possible imaging. No neuro deficits noted at triage. NAD noted.

## 2023-12-28 NOTE — Progress Notes (Signed)
 ok

## 2024-02-22 NOTE — Progress Notes (Signed)
 HPI: FU CAD and atrial fibrillation. Cardiac catheterization 5/11 showed normal LM, 60 LAD, 90 D2, 50-60 LCx, normal RCA, and nl LV function. Patient had PCI of the diagonal with DES at that time. Carotid Dopplers in Oct 2013 were normal. Cardiac catheterization December 2019 showed nonobstructive coronary disease with moderate mid LAD and moderate in-stent restenosis in second diagonal and no disease in the circumflex or right coronary artery; nl LV function and filling pressures. Monitor April 2022 showed sinus rhythm with occasional PAC, brief PAT and PVC. Found to have atrial fibrillation June 2023.  Metoprolol  was increased and apixaban  added to her medications.  Monitor July 2023 showed sinus rhythm with PACs, brief runs of PAT, occasional PVC and couplet.  PET scan August 2023 showed ejection fraction 71% and normal perfusion.  Echocardiogram November 2025 showed normal LV function, mild left atrial enlargement, mild to moderate mitral regurgitation, mild to moderate aortic insufficiency with mild aortic stenosis (mean gradient 10 mmHg).  Since I last saw her, she denies dyspnea, chest pain, palpitations, syncope or bleeding.  Current Outpatient Medications  Medication Sig Dispense Refill   alendronate (FOSAMAX) 70 MG tablet Take 70 mg by mouth once a week.     atorvastatin  (LIPITOR ) 80 MG tablet TAKE 1 TABLET BY MOUTH EVERY DAY AT 6PM 90 tablet 3   calcium  carbonate (OSCAL) 1500 (600 Ca) MG TABS tablet Take 600 mg of elemental calcium  by mouth daily.     cholecalciferol (VITAMIN D ) 1000 UNITS tablet Take 1,000 Units by mouth every evening.      ELIQUIS  5 MG TABS tablet TAKE 1 TABLET BY MOUTH TWICE A DAY 180 tablet 1   isosorbide  mononitrate (IMDUR ) 30 MG 24 hr tablet TAKE 1 TABLET BY MOUTH EVERY DAY 90 tablet 3   loratadine  (CLARITIN ) 10 MG tablet Take 10 mg by mouth daily.      metoprolol  succinate (TOPROL -XL) 50 MG 24 hr tablet TAKE 1 AND 1/2 TABLETS DAILY (75 MG TOTAL) BY MOUTH WITH  FOOD OR IMMEDIATELY AFTER MEALS 135 tablet 3   nitroGLYCERIN  (NITROSTAT ) 0.4 MG SL tablet Place 1 tablet (0.4 mg total) under the tongue every 5 (five) minutes as needed. MAX 3 doses 25 tablet 3   Probiotic Product (PROBIOTIC PO) Take 1 tablet by mouth daily.      vitamin C (ASCORBIC ACID) 500 MG tablet Take 500 mg by mouth at bedtime.     Zinc 50 MG TABS Take 50 mg by mouth.     No current facility-administered medications for this visit.     Past Medical History:  Diagnosis Date   Allergy    seasonal   Anemia    prior to hysterectomy, fibroids   Aortic insufficiency    a. 10/2015 Echo: mild to moderate AI.   Aortic stenosis    a. 10/2015 Echo: moderate AS.   Blood transfusion without reported diagnosis    with hysterectomy   CAD (coronary artery disease)    a. 08/2009 s/p cath after abnormal nuclear study; LM nl, LAD 60%, D2 90% (PCI/DES), LCX 50 -60%, RCA nl, nl EF;  b. 2012 MV: no ischemia/infarct;  c. 08/2015 MV: no ischemia/infarct, EF 70%.   Clotting disorder    Complication of anesthesia    Diastolic dysfunction    a. 10/2015 Echo: EF 55-60%, no rwma, Gr1 DD, mod AS, mild to mod AI.   Essential hypertension    preventive meds due to heart   H/O: rheumatic fever  Heart murmur    History of colonoscopy    Hyperlipidemia    preventative med for heart   Osteopenia    last DEXA 11-22-13    Osteoporosis    PONV (postoperative nausea and vomiting)     Past Surgical History:  Procedure Laterality Date   ANGIOPLASTY  08/2009   stents   AORTIC ARCH ANGIOGRAPHY N/A 03/29/2018   Procedure: AORTIC ARCH ANGIOGRAPHY;  Surgeon: Mady Bruckner, MD;  [facility]: [facility];  Service: Cardiovascular;  Laterality: N/A;   BACK SURGERY     11/2020   CHOLECYSTECTOMY  02/2003   COLONOSCOPY  08/06/2021   per Dr. Abran, clear, no repeats needed   CORONARY PRESSURE/FFR STUDY N/A 03/29/2018   Procedure: INTRAVASCULAR PRESSURE WIRE/FFR STUDY;  Surgeon: Mady Bruckner, MD;   [facility]: [facility];  Service: Cardiovascular;  Laterality: N/A;   KNEE ARTHROSCOPY     Rt. Knee   LEFT HEART CATH AND CORONARY ANGIOGRAPHY N/A 03/29/2018   Procedure: LEFT HEART CATH AND CORONARY ANGIOGRAPHY;  Surgeon: Mady Bruckner, MD;  [facility]: [facility];  Service: Cardiovascular;  Laterality: N/A;   LUMBAR LAMINECTOMY/DECOMPRESSION MICRODISCECTOMY Left 01/03/2020   Procedure: Left L2-3 decompression disectomy, Insitu Fusion Lumbar 2-3;  Surgeon: Burnetta Aures, MD;  Location: [facility];  Service: Orthopedics;  Laterality: Left;   POLYPECTOMY     TONSILLECTOMY     TONSILLECTOMY     TUBAL LIGATION     VAGINAL HYSTERECTOMY  1994    Social History   Socioeconomic History   Marital status: Married    Spouse name: Not on file   Number of children: 2   Years of education: Not on file   Highest education level: Not on file  Occupational History   Occupation: Retired Magazine Features Editor: RETIRED  Tobacco Use   Smoking status: Former    Current packs/day: 0.00    Types: Cigarettes    Quit date: 04/12/1977    Years since quitting: 46.9   Smokeless tobacco: Never   Tobacco comments:    quit age 56  Vaping Use   Vaping status: Never Used  Substance and Sexual Activity   Alcohol use: Not Currently    Alcohol/week: 0.0 standard drinks of alcohol    Comment: rarely   Drug use: No   Sexual activity: Not Currently  Other Topics Concern   Not on file  Social History Narrative   Not on file   Social Drivers of Health   Financial Resource Strain: Low Risk  (06/10/2023)   Overall Financial Resource Strain (CARDIA)    Difficulty of Paying Living Expenses: Not hard at all  Food Insecurity: No Food Insecurity (06/10/2023)   Hunger Vital Sign    Worried About Running Out of Food in the Last Year: Never true    Ran Out of Food in the Last Year: Never true  Transportation Needs: No Transportation Needs (06/10/2023)   PRAPARE - Administrator, Civil Service  (Medical): No    Lack of Transportation (Non-Medical): No  Physical Activity: Insufficiently Active (06/10/2023)   Exercise Vital Sign    Days of Exercise per Week: 3 days    Minutes of Exercise per Session: 40 min  Stress: No Stress Concern Present (06/10/2023)   [employer] of Occupational Health - Occupational Stress Questionnaire    Feeling of Stress : Not at all  Social Connections: Socially Integrated (06/10/2023)   Social Connection and Isolation Panel  Frequency of Communication with Friends and Family: More than three times a week    Frequency of Social Gatherings with Friends and Family: More than three times a week    Attends Religious Services: More than 4 times per year    Active Member of [employer] or Organizations: Yes    Attends Engineer, Structural: More than 4 times per year    Marital Status: Married  Catering Manager Violence: Not At Risk (06/10/2023)   Humiliation, Afraid, Rape, and Kick questionnaire    Fear of Current or Ex-Partner: No    Emotionally Abused: No    Physically Abused: No    Sexually Abused: No    Family History  Problem Relation Age of Onset   Heart disease Mother    Heart disease Father        CABG   Heart disease Sister    Heart disease Brother        2 stents   Heart disease Maternal Grandmother    Heart disease Maternal Grandfather    Heart disease Paternal Grandmother    Heart disease Paternal Grandfather    Ulcerative colitis Daughter    Colon cancer Neg Hx    Colon polyps Neg Hx    Esophageal cancer Neg Hx    Rectal cancer Neg Hx    Stomach cancer Neg Hx     ROS: no fevers or chills, productive cough, hemoptysis, dysphasia, odynophagia, melena, hematochezia, dysuria, hematuria, rash, seizure activity, orthopnea, PND, pedal edema, claudication. Remaining systems are negative.  Physical Exam: Well-developed well-nourished in no acute distress.  Skin is warm and dry.  HEENT is normal.  Neck is supple.  Chest is  clear to auscultation with normal expansion.  Cardiovascular exam is regular rate and rhythm.  Abdominal exam nontender or distended. No masses palpated. Extremities show no edema. neuro grossly intact  EKG Interpretation Date/Time:  Monday March 05 2024 08:22:17 EST Ventricular Rate:  65 PR Interval:  164 QRS Duration:  72 QT Interval:  388 QTC Calculation: 403 R Axis:   73  Text Interpretation: Sinus rhythm with pacs Confirmed by Pietro Rogue ([id number]) on 03/05/2024 8:24:17 AM    A/P  1 aortic stenosis/aortic insufficiency-mild to moderate aortic insufficiency and mild aortic stenosis on most recent echocardiogram.  Plan follow-up study November 2026.  2 paroxysmal atrial fibrillation-patient is in sinus rhythm on exam today.  Will continue metoprolol  and apixaban  at present dose.  Check potassium and renal function.  3 coronary artery disease-patient denies chest pain.  Continue statin.  4 hyperlipidemia-continue lipitor .  Check LP(a), lipids and liver.  5 hypertension-blood pressure controlled.  Continue present medical regimen.  Rogue Pietro, MD

## 2024-03-02 ENCOUNTER — Other Ambulatory Visit: Payer: Self-pay

## 2024-03-02 ENCOUNTER — Ambulatory Visit (HOSPITAL_COMMUNITY)
Admission: RE | Admit: 2024-03-02 | Discharge: 2024-03-02 | Disposition: A | Discharge status: Home / Self Care | Source: Ambulatory Visit | Attending: Cardiology | Admitting: Cardiology

## 2024-03-02 ENCOUNTER — Ambulatory Visit: Payer: Self-pay | Admitting: Cardiology

## 2024-03-02 DIAGNOSIS — I350 Nonrheumatic aortic (valve) stenosis: Secondary | ICD-10-CM

## 2024-03-02 LAB — ECHOCARDIOGRAM COMPLETE
AR max vel: 1.55 cm2
AV Area VTI: 1.37 cm2
AV Area mean vel: 1.45 cm2
AV Mean grad: 10 mmHg
AV Peak grad: 18.9 mmHg
Ao pk vel: 2.18 m/s
Area-P 1/2: 3.13 cm2
P 1/2 time: 423 ms
S' Lateral: 2.41 cm

## 2024-03-05 ENCOUNTER — Ambulatory Visit: Attending: Cardiology | Admitting: Cardiology

## 2024-03-05 ENCOUNTER — Encounter: Payer: Self-pay | Admitting: Cardiology

## 2024-03-05 VITALS — BP 110/70 | HR 92 | Ht 65.0 in | Wt 173.6 lb

## 2024-03-05 DIAGNOSIS — E785 Hyperlipidemia, unspecified: Secondary | ICD-10-CM | POA: Insufficient documentation

## 2024-03-05 DIAGNOSIS — I480 Paroxysmal atrial fibrillation: Secondary | ICD-10-CM | POA: Insufficient documentation

## 2024-03-05 DIAGNOSIS — I10 Essential (primary) hypertension: Secondary | ICD-10-CM | POA: Diagnosis present

## 2024-03-05 DIAGNOSIS — I350 Nonrheumatic aortic (valve) stenosis: Secondary | ICD-10-CM | POA: Insufficient documentation

## 2024-03-05 DIAGNOSIS — I2510 Atherosclerotic heart disease of native coronary artery without angina pectoris: Secondary | ICD-10-CM | POA: Insufficient documentation

## 2024-03-05 LAB — CBC

## 2024-03-05 NOTE — Patient Instructions (Signed)
   Testing/Procedures:  Your physician has requested that you have an echocardiogram. Echocardiography is a painless test that uses sound waves to create images of your heart. It provides your doctor with information about the size and shape of your heart and how well your heart's chambers and valves are working. This procedure takes approximately one hour. There are no restrictions for this procedure. Please do NOT wear cologne, perfume, aftershave, or lotions (deodorant is allowed). Please arrive 15 minutes prior to your appointment time.  Please note: We ask at that you not bring children with you during ultrasound (echo/ vascular) testing. Due to room size and safety concerns, children are not allowed in the ultrasound rooms during exams. Our front office staff cannot provide observation of children in our lobby area while testing is being conducted. An adult accompanying a patient to their appointment will only be allowed in the ultrasound room at the discretion of the ultrasound technician under special circumstances. We apologize for any inconvenience. [address] NOVEMBER 2026  Follow-Up: At [facility], you and your health needs are our priority.  As part of our continuing mission to provide you with exceptional heart care, our providers are all part of one team.  This team includes your primary Cardiologist (physician) and Advanced Practice Providers or APPs (Physician Assistants and Nurse Practitioners) who all work together to provide you with the care you need, when you need it.  Your next appointment:   12 month(s)  Provider:   Redell Shallow, MD

## 2024-03-06 ENCOUNTER — Ambulatory Visit: Payer: Self-pay | Admitting: Cardiology

## 2024-03-06 DIAGNOSIS — I480 Paroxysmal atrial fibrillation: Secondary | ICD-10-CM

## 2024-03-07 LAB — COMPREHENSIVE METABOLIC PANEL WITH GFR
ALT: 32 IU/L (ref 0–32)
AST: 39 IU/L (ref 0–40)
Albumin: 4.5 g/dL (ref 3.8–4.8)
Alkaline Phosphatase: 143 IU/L — ABNORMAL HIGH (ref 49–135)
BUN/Creatinine Ratio: 16 (ref 12–28)
BUN: 15 mg/dL (ref 8–27)
Bilirubin Total: 1.1 mg/dL (ref 0.0–1.2)
CO2: 25 mmol/L (ref 20–29)
Calcium: 9.9 mg/dL (ref 8.7–10.3)
Chloride: 105 mmol/L (ref 96–106)
Creatinine, Ser: 0.96 mg/dL (ref 0.57–1.00)
Globulin, Total: 2.1 g/dL (ref 1.5–4.5)
Glucose: 92 mg/dL (ref 70–99)
Potassium: 4.6 mmol/L (ref 3.5–5.2)
Sodium: 142 mmol/L (ref 134–144)
Total Protein: 6.6 g/dL (ref 6.0–8.5)
eGFR: 60 mL/min/1.73 (ref >59)

## 2024-03-07 LAB — CBC
Hematocrit: 45.7 % (ref 34.0–46.6)
Hemoglobin: 14.6 g/dL (ref 11.1–15.9)
MCH: 26.4 pg — AB (ref 26.6–33.0)
MCHC: 31.9 g/dL (ref 31.5–35.7)
MCV: 83 fL (ref 79–97)
Platelets: 241 x10E3/uL (ref 150–450)
RBC: 5.52 x10E6/uL — AB (ref 3.77–5.28)
RDW: 15 % (ref 11.7–15.4)
WBC: 9.4 x10E3/uL (ref 3.4–10.8)

## 2024-03-07 LAB — LIPID PANEL
Chol/HDL Ratio: 2 ratio (ref 0.0–4.4)
Cholesterol, Total: 121 mg/dL (ref 100–199)
HDL: 61 mg/dL (ref >39)
LDL Chol Calc (NIH): 41 mg/dL (ref 0–99)
Triglycerides: 102 mg/dL (ref 0–149)
VLDL Cholesterol Cal: 19 mg/dL (ref 5–40)

## 2024-03-07 LAB — LIPOPROTEIN A (LPA): Lipoprotein (a): 36.9 nmol/L (ref <75.0)

## 2024-03-15 MED ORDER — APIXABAN 5 MG PO TABS: 5.0000 mg | ORAL_TABLET | Freq: Two times a day (BID) | ORAL | 3 refills | Status: AC

## 2024-03-22 ENCOUNTER — Other Ambulatory Visit: Payer: Self-pay | Admitting: Cardiology

## 2024-03-28 ENCOUNTER — Encounter: Payer: Self-pay | Admitting: Family Medicine

## 2024-03-28 ENCOUNTER — Ambulatory Visit: Payer: Self-pay | Admitting: Family Medicine

## 2024-03-28 ENCOUNTER — Ambulatory Visit: Payer: Medicare Other | Admitting: Family Medicine

## 2024-03-28 VITALS — BP 110/62 | HR 75 | Temp 97.6°F | Ht 65.0 in | Wt 176.0 lb

## 2024-03-28 DIAGNOSIS — I350 Nonrheumatic aortic (valve) stenosis: Secondary | ICD-10-CM

## 2024-03-28 DIAGNOSIS — M8580 Other specified disorders of bone density and structure, unspecified site: Secondary | ICD-10-CM | POA: Diagnosis not present

## 2024-03-28 DIAGNOSIS — I480 Paroxysmal atrial fibrillation: Secondary | ICD-10-CM

## 2024-03-28 DIAGNOSIS — I2510 Atherosclerotic heart disease of native coronary artery without angina pectoris: Secondary | ICD-10-CM

## 2024-03-28 DIAGNOSIS — I10 Essential (primary) hypertension: Secondary | ICD-10-CM | POA: Diagnosis not present

## 2024-03-28 DIAGNOSIS — E7800 Pure hypercholesterolemia, unspecified: Secondary | ICD-10-CM | POA: Diagnosis not present

## 2024-03-28 LAB — TSH: TSH: 3.6 u[IU]/mL (ref 0.35–5.50)

## 2024-03-28 NOTE — Progress Notes (Signed)
 Subjective:    Patient ID: Anne Martin, female    DOB: [birth year], 78 y.o.   MRN: [id number]  HPI Here to follow up on issues. She feels well and has no concerns. She saw Dr. Pietro last month, and he was pleased with her status. She has been in sinus rhythm for at least a year. Her BP is stable. She had an ECHO which showed an EF of 60-65% and her AV showed only mild stenosis. Her GYN, Dr. Kandyce, started her on Alendronate last year for osteoporosis. She had a follow up DEXA this past September which showed bone growth so that now she has osteopenia. She had labs last month that showed her LDL to be 41 and her renal function to be normal.    Review of Systems  Constitutional: Negative.   HENT: Negative.    Eyes: Negative.   Respiratory: Negative.    Cardiovascular: Negative.   Gastrointestinal: Negative.   Genitourinary:  Negative for decreased urine volume, difficulty urinating, dyspareunia, dysuria, enuresis, flank pain, frequency, hematuria, pelvic pain and urgency.  Musculoskeletal: Negative.   Skin: Negative.   Neurological: Negative.  Negative for headaches.  Psychiatric/Behavioral: Negative.         Objective:   Physical Exam Constitutional:      General: She is not in acute distress.    Appearance: Normal appearance. She is well-developed.  HENT:     Head: Normocephalic and atraumatic.     Right Ear: External ear normal.     Left Ear: External ear normal.     Nose: Nose normal.     Mouth/Throat:     Pharynx: No oropharyngeal exudate.  Eyes:     General: No scleral icterus.    Conjunctiva/sclera: Conjunctivae normal.     Pupils: Pupils are equal, round, and reactive to light.  Neck:     Thyroid : No thyromegaly.     Vascular: No JVD.  Cardiovascular:     Rate and Rhythm: Normal rate and regular rhythm.     Pulses: Normal pulses.     Heart sounds:     No friction rub. No gallop.     Comments: 1/6 SM  Pulmonary:     Effort: Pulmonary effort is normal. No  respiratory distress.     Breath sounds: Normal breath sounds. No wheezing or rales.  Chest:     Chest wall: No tenderness.  Abdominal:     General: Bowel sounds are normal. There is no distension.     Palpations: Abdomen is soft. There is no mass.     Tenderness: There is no abdominal tenderness. There is no guarding or rebound.  Musculoskeletal:        General: No tenderness. Normal range of motion.     Cervical back: Normal range of motion and neck supple.  Lymphadenopathy:     Cervical: No cervical adenopathy.  Skin:    General: Skin is warm and dry.     Findings: No erythema or rash.  Neurological:     General: No focal deficit present.     Mental Status: She is alert and oriented to person, place, and time.     Cranial Nerves: No cranial nerve deficit.     Motor: No abnormal muscle tone.     Coordination: Coordination normal.     Deep Tendon Reflexes: Reflexes are normal and symmetric. Reflexes normal.  Psychiatric:        Mood and Affect: Mood normal.  Behavior: Behavior normal.        Thought Content: Thought content normal.        Judgment: Judgment normal.           Assessment & Plan:  She is doing quite well overall. 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 She will follow up with Dr. Fogleman for the osteopenia. Her hyperlipidemia is well controlled. We will check a TSH today. I personally spent a total of 35 minutes in the care of the patient today including getting/reviewing separately obtained history, performing a medically appropriate exam/evaluation, independently interpreting results, and communicating results.  Garnette Olmsted, MD

## 2024-05-16 LAB — HM MAMMOGRAPHY

## 2024-06-13 ENCOUNTER — Ambulatory Visit: Payer: Medicare Other
